# Patient Record
Sex: Female | Born: 1985 | Race: White | Hispanic: No | Marital: Single | Smoking: Current every day smoker
Health system: Southern US, Community
[De-identification: ages and names within clinical notes are randomized; demographics above are authoritative.]

## PROBLEM LIST (undated history)

## (undated) DIAGNOSIS — O139 Gestational [pregnancy-induced] hypertension without significant proteinuria, unspecified trimester: Secondary | ICD-10-CM

## (undated) DIAGNOSIS — D649 Anemia, unspecified: Secondary | ICD-10-CM

## (undated) DIAGNOSIS — F419 Anxiety disorder, unspecified: Secondary | ICD-10-CM

## (undated) DIAGNOSIS — Z5189 Encounter for other specified aftercare: Secondary | ICD-10-CM

## (undated) DIAGNOSIS — I10 Essential (primary) hypertension: Secondary | ICD-10-CM

## (undated) DIAGNOSIS — O24419 Gestational diabetes mellitus in pregnancy, unspecified control: Secondary | ICD-10-CM

## (undated) DIAGNOSIS — N289 Disorder of kidney and ureter, unspecified: Secondary | ICD-10-CM

## (undated) DIAGNOSIS — R87629 Unspecified abnormal cytological findings in specimens from vagina: Secondary | ICD-10-CM

## (undated) DIAGNOSIS — F1490 Cocaine use, unspecified, uncomplicated: Secondary | ICD-10-CM

## (undated) DIAGNOSIS — Z87898 Personal history of other specified conditions: Secondary | ICD-10-CM

## (undated) DIAGNOSIS — I639 Cerebral infarction, unspecified: Secondary | ICD-10-CM

## (undated) DIAGNOSIS — M1990 Unspecified osteoarthritis, unspecified site: Secondary | ICD-10-CM

## (undated) DIAGNOSIS — F32A Depression, unspecified: Secondary | ICD-10-CM

## (undated) DIAGNOSIS — R0600 Dyspnea, unspecified: Secondary | ICD-10-CM

## (undated) DIAGNOSIS — K7460 Unspecified cirrhosis of liver: Secondary | ICD-10-CM

## (undated) DIAGNOSIS — F1910 Other psychoactive substance abuse, uncomplicated: Secondary | ICD-10-CM

## (undated) HISTORY — DX: Dyspnea, unspecified: R06.00

## (undated) HISTORY — DX: Anemia, unspecified: D64.9

## (undated) HISTORY — DX: Unspecified abnormal cytological findings in specimens from vagina: R87.629

## (undated) HISTORY — DX: Cerebral infarction, unspecified: I63.9

## (undated) HISTORY — DX: Gestational (pregnancy-induced) hypertension without significant proteinuria, unspecified trimester: O13.9

## (undated) HISTORY — DX: Encounter for other specified aftercare: Z51.89

## (undated) HISTORY — DX: Unspecified osteoarthritis, unspecified site: M19.90

## (undated) HISTORY — DX: Depression, unspecified: F32.A

## (undated) HISTORY — PX: ARTERY REPAIR: SHX559

## (undated) HISTORY — PX: TEAR DUCT PROBING: SHX793

## (undated) HISTORY — PX: TONSILLECTOMY: SUR1361

## (undated) HISTORY — DX: Gestational diabetes mellitus in pregnancy, unspecified control: O24.419

## (undated) HISTORY — PX: TUBAL LIGATION: SHX77

## (undated) HISTORY — PX: MOUTH SURGERY: SHX715

---
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
## 2003-02-08 ENCOUNTER — Ambulatory Visit (HOSPITAL_COMMUNITY): Admission: RE | Admit: 2003-02-08 | Discharge: 2003-02-08 | Payer: Self-pay | Admitting: Obstetrics & Gynecology

## 2003-03-19 ENCOUNTER — Observation Stay (HOSPITAL_COMMUNITY): Admission: AD | Admit: 2003-03-19 | Discharge: 2003-03-20 | Payer: Self-pay | Admitting: Obstetrics & Gynecology

## 2003-04-10 ENCOUNTER — Observation Stay (HOSPITAL_COMMUNITY): Admission: AD | Admit: 2003-04-10 | Discharge: 2003-04-11 | Payer: Self-pay | Admitting: Obstetrics & Gynecology

## 2003-04-15 ENCOUNTER — Encounter (INDEPENDENT_AMBULATORY_CARE_PROVIDER_SITE_OTHER): Payer: Self-pay | Admitting: Specialist

## 2003-04-15 ENCOUNTER — Inpatient Hospital Stay (HOSPITAL_COMMUNITY): Admission: AD | Admit: 2003-04-15 | Discharge: 2003-04-18 | Payer: Self-pay | Admitting: Obstetrics & Gynecology

## 2003-06-08 ENCOUNTER — Inpatient Hospital Stay (HOSPITAL_COMMUNITY): Admission: AD | Admit: 2003-06-08 | Discharge: 2003-06-08 | Payer: Self-pay | Admitting: Obstetrics and Gynecology

## 2004-02-05 ENCOUNTER — Inpatient Hospital Stay (HOSPITAL_COMMUNITY): Admission: AD | Admit: 2004-02-05 | Discharge: 2004-02-06 | Payer: Self-pay | Admitting: Obstetrics & Gynecology

## 2004-05-20 ENCOUNTER — Inpatient Hospital Stay (HOSPITAL_COMMUNITY): Admission: AD | Admit: 2004-05-20 | Discharge: 2004-05-23 | Payer: Self-pay | Admitting: *Deleted

## 2004-05-20 ENCOUNTER — Encounter (INDEPENDENT_AMBULATORY_CARE_PROVIDER_SITE_OTHER): Payer: Self-pay | Admitting: Specialist

## 2004-05-20 ENCOUNTER — Ambulatory Visit: Payer: Self-pay | Admitting: *Deleted

## 2004-05-25 ENCOUNTER — Inpatient Hospital Stay (HOSPITAL_COMMUNITY): Admission: AD | Admit: 2004-05-25 | Discharge: 2004-05-25 | Payer: Self-pay | Admitting: Family Medicine

## 2005-02-20 ENCOUNTER — Inpatient Hospital Stay (HOSPITAL_COMMUNITY): Admission: AD | Admit: 2005-02-20 | Discharge: 2005-02-20 | Payer: Self-pay | Admitting: Family Medicine

## 2006-11-10 ENCOUNTER — Encounter (INDEPENDENT_AMBULATORY_CARE_PROVIDER_SITE_OTHER): Payer: Self-pay | Admitting: Specialist

## 2006-11-10 ENCOUNTER — Ambulatory Visit: Payer: Self-pay | Admitting: Family Medicine

## 2006-11-10 ENCOUNTER — Inpatient Hospital Stay (HOSPITAL_COMMUNITY): Admission: AD | Admit: 2006-11-10 | Discharge: 2006-11-13 | Payer: Self-pay | Admitting: Family Medicine

## 2006-11-16 ENCOUNTER — Inpatient Hospital Stay (HOSPITAL_COMMUNITY): Admission: AD | Admit: 2006-11-16 | Discharge: 2006-11-16 | Payer: Self-pay | Admitting: Obstetrics and Gynecology

## 2006-11-24 ENCOUNTER — Ambulatory Visit: Payer: Self-pay | Admitting: Obstetrics & Gynecology

## 2008-01-02 ENCOUNTER — Emergency Department (HOSPITAL_COMMUNITY): Admission: EM | Admit: 2008-01-02 | Discharge: 2008-01-02 | Payer: Self-pay | Admitting: Emergency Medicine

## 2008-09-18 ENCOUNTER — Emergency Department (HOSPITAL_COMMUNITY): Admission: EM | Admit: 2008-09-18 | Discharge: 2008-09-18 | Payer: Self-pay | Admitting: Emergency Medicine

## 2009-03-11 ENCOUNTER — Inpatient Hospital Stay (HOSPITAL_COMMUNITY): Admission: AD | Admit: 2009-03-11 | Discharge: 2009-03-11 | Payer: Self-pay | Admitting: Obstetrics & Gynecology

## 2010-02-25 ENCOUNTER — Emergency Department (HOSPITAL_COMMUNITY): Admission: EM | Admit: 2010-02-25 | Discharge: 2010-02-25 | Payer: Self-pay | Admitting: Emergency Medicine

## 2010-11-09 LAB — URINALYSIS, ROUTINE W REFLEX MICROSCOPIC
Bilirubin Urine: NEGATIVE
Nitrite: NEGATIVE
Protein, ur: NEGATIVE mg/dL
Specific Gravity, Urine: 1.015 (ref 1.005–1.030)
Urobilinogen, UA: 0.2 mg/dL (ref 0.0–1.0)
pH: 7 (ref 5.0–8.0)

## 2010-11-09 LAB — CBC
Hemoglobin: 11.2 g/dL — ABNORMAL LOW (ref 12.0–15.0)
MCH: 26.8 pg (ref 26.0–34.0)
MCV: 81.1 fL (ref 78.0–100.0)
RBC: 4.18 MIL/uL (ref 3.87–5.11)
RDW: 17.5 % — ABNORMAL HIGH (ref 11.5–15.5)

## 2010-11-09 LAB — DIFFERENTIAL
Basophils Relative: 1 % (ref 0–1)
Eosinophils Relative: 4 % (ref 0–5)
Lymphocytes Relative: 19 % (ref 12–46)
Lymphs Abs: 1.3 10*3/uL (ref 0.7–4.0)
Neutro Abs: 4.7 10*3/uL (ref 1.7–7.7)
Neutrophils Relative %: 68 % (ref 43–77)

## 2010-11-09 LAB — URINE MICROSCOPIC-ADD ON

## 2010-11-09 LAB — WET PREP, GENITAL

## 2010-11-09 LAB — RPR: RPR Ser Ql: NONREACTIVE

## 2010-11-30 LAB — CBC
HCT: 36.7 % (ref 36.0–46.0)
Hemoglobin: 12.5 g/dL (ref 12.0–15.0)
MCHC: 34 g/dL (ref 30.0–36.0)
MCV: 90.1 fL (ref 78.0–100.0)
Platelets: 328 10*3/uL (ref 150–400)
WBC: 7.4 10*3/uL (ref 4.0–10.5)

## 2010-11-30 LAB — URINALYSIS, ROUTINE W REFLEX MICROSCOPIC
Glucose, UA: NEGATIVE mg/dL
Nitrite: POSITIVE — AB
Protein, ur: >300 mg/dL — AB
Specific Gravity, Urine: 1.025 (ref 1.005–1.030)
Urobilinogen, UA: 2 mg/dL — ABNORMAL HIGH (ref 0.0–1.0)
pH: 6.5 (ref 5.0–8.0)

## 2010-11-30 LAB — URINE MICROSCOPIC-ADD ON

## 2010-11-30 LAB — GC/CHLAMYDIA PROBE AMP, GENITAL: Chlamydia, DNA Probe: NEGATIVE

## 2010-11-30 LAB — URINE CULTURE

## 2011-01-09 NOTE — Discharge Summary (Signed)
   [name], [name]                      ACCOUNT NO.:  [id number]   MEDICAL RECORD NO.:  [id number]                   PATIENT TYPE:  OBV   LOCATION:  9169                                 [facility]   PHYSICIAN:  Gerrit Friends. Aldona Bar, M.D.                DATE OF BIRTH:  [birth year]   DATE OF ADMISSION:  04/10/2003  DATE OF DISCHARGE:  04/11/2003                                 DISCHARGE SUMMARY   DISCHARGE DIAGNOSES:  1. A 38 week intrauterine pregnancy, undelivered.  2. Larey Seat on abdomen.   PROCEDURE:  Observation.   HISTORY OF PRESENT ILLNESS:  This 25 year old gravida 2, aborta 1 female at  approximately [redacted] weeks gestation presented to triage on the evening of  April 10, 2003, relating a fall. Apparently she was running from her  brother and she fell on her stomach. She presented to triage and evaluation  in triage included a cervical examination which revealed her to be 1 cm  thick with a presenting part, vertex, high, and her abdomen was soft. She  was not contracting. Her abdomen was relatively nontender. She had some  scattered abrasions on her hands and knees.   HOSPITAL COURSE:  She was admitted and placed on the fetal monitor for  continuous observation. A review of the tracing finds a very reactive  tracing, which is very reassuring. She also had a CBC done, which revealed a  hemoglobin of 12.2, a white count of 11,500 and a platelet count of 337,000.  A Kleihauer-Betke test was done, which was completely negative - no fetal  cells were seen. On the morning of April 11, 2003, she had rested well  during the evening. Her tracing remained very reactive and reassuring. She  was not contracting and she was discharged to home with instructions to  follow up in the office or as needed at the hospital.   CONDITION ON DISCHARGE:  Improved.   FOLLOW UP:  Followup in the office will be carried out in 5 days or as  needed.   DISCHARGE MEDICATIONS:  She will resume her  prenatal vitamins and return as  needed.                                               Gerrit Friends. Aldona Bar, M.D.    RMW/MEDQ  D:  04/11/2003  T:  04/11/2003  Job:  [id number]

## 2011-01-09 NOTE — Op Note (Signed)
[name], [name]                      ACCOUNT NO.:  [id number]   MEDICAL RECORD NO.:  [id number]                   PATIENT TYPE:  INP   LOCATION:  9165                                 [facility]   PHYSICIAN:  Gerrit Friends. Aldona Bar, M.D.                DATE OF BIRTH:  September 25, 1985   DATE OF PROCEDURE:  04/15/2003  DATE OF DISCHARGE:                                 OPERATIVE REPORT   PREOPERATIVE DIAGNOSES:  1. Term pregnancy.  2. Active labor.  3. Nonreassuring fetal heart tracings.   POSTOPERATIVE DIAGNOSES:  1. Term pregnancy.  2. Active labor.  3. Nonreassuring fetal heart tracings.  4. Delivery of 6 pound 13 ounce female infant; Apgars 8 and 9.   PROCEDURE:  Primary low transverse cesarean section.   SURGEON:  Gerrit Friends. Aldona Bar, M.D.   ANESTHESIA:  Epidural.   ANESTHESIOLOGIST:  Burnett Corrente, M.D.   HISTORY:  This 25 year old female presented on the morning of 04/15/2003 in  early active labor.  At the time of presentation she was totally out of  control, crying and uttering profanities.  Her pregnancy, however, had been  relatively benign.  When she was examined initially her cervix was 1 cm  dilated, posterior vertex and minus 2 station.  She was admitted, and after  several doses of intravenous Stadol quieted down, and ultimately had an  epidural placed (even though she was not 2 cm dilated).  Naturally, after  that, she became more comfortable.  At 2 p.m. she was 2 cm dilated, 50%  effaced with a vertex at minus 2 station; artificial rupture of membranes  was carried out with production of a minimal amount of amniotic fluid (which  appeared to be meconium stained).  IUPC was placed, and amnio infusion was  begun.  Pitocin augmentation likewise ultimately begun.  She did have an  episode of fetal bradycardia, after rupture of membranes, that have  responded to position change.  The patient was put on oxygen.  Thereafter  the tracing became fairly reactive and  Pitocin was ultimately started.  At  about 5:30 p.m. she had progressed to 4 cm of dilatation, 90% effaced, with  a vertex of minus 1 station.  But, after being examined, had another  deceleration into the 90s, which lasted about five minutes.  Although she  was contracting well, it was felt that the tracing at this time was  nonreassuring enough and delivery was far enough away that the best option  was to proceed to delivery by cesarean section at this time.  Indeed, in the  operating room while she was being prepped for her cesarean section, she had  another deceleration of the fetal heart into the 80s, which lasted 3-5 mins.   She was taken to the operating room for a nonreassuring tracing, with  delivery by primary low transverse cesarean section.   DESCRIPTION OF PROCEDURE:  The patient  was taken to the operating room as  mentioned.  Her epidural was augmented.  After she was properly positioned,  prepped and draped; with good anesthetic levels thereafter noted, the  procedure was begun.  A Pfannenstiel incision was made with minimal  difficulty; dissected down sharply to and through the fascia in a low  transverse fashion.  Hemostasis was created in each layer.  Subfascial space  was created inferiorly and superiorly.  Muscles separated in the midline.  Peritoneum identified and entered appropriately, with care taken to avoid  bowels superiorly and the bladder inferiorly.   At this time the vesicouterine peritoneum was incised in a low transverse  fashion, and pushed off the lower uterine segment with ease.  Sharp incision  to the uterus made with the Metzenbaum scissors then carried out in the low  transverse fashion, and extended with the fingers.  Meconium-stained fluid  was encountered.  Thereafter delivery of the head was carried out, and  before the baby was allowed to breathe, the baby was DeLee suctioned  (although no particulate meconium was produced from the baby's  stomach).  At  this time the remainder of the baby was delivered.  After the cord was  clamped and cut the infant was passed off to the awaiting team.  The baby  cried vigorously, and Apgars were signed at 8 and 9.  The infant was  subsequently taken to the nursery in good condition, found to weigh 6 pounds  13 ounces, and in the nursery was doing well.   After the cord bloods were collected, the placenta was delivered intact. The  membranes were stained green.  The placenta was sent to pathology and  appropriately labeled.  At this time the uterus was exteriorized, inspected  and free of any remaining products of conception.  Then closure of the  uterine incision was carried out using a single layer of #1 Vicryl in a  running, locking fashion.  This was oversewn with several figure-of-eight #1  Vicryl for additional hemostasis.   At this time the incision looked dry.  The uterus was well contracted.  Tubes and ovaries appeared normal. After the abdomen was lavaged of all free  blood and clot, the uterus was replaced back into the abdominal cavity.  After all counts were noted to correct, and no foreign bodies were noted to  be remaining in the abdominal cavity, closure of the abdomen was begun in  layers.  The abdominal peritoneum was closed with 0 Vicryl in a running  fashion.  The muscles were secured with the same.  With noting subfascial  hemostasis, the fascia was reapproximated using 0 Vicryl from angle to  midline bilaterally. Subcutaneous tissue was then rendered hemostatic, and  staples were then used to close the skin.  A sterile fresh dressing was  applied.   At this time the patient was transported to the recovery room in  satisfactory condition, having tolerated the procedure well.   ESTIMATED BLOOD LOSS:  500 cc.   DISPOSITION:  All counts were correct x2.  At conclusion of the procedure  both the mother and baby were doing well in their respective recovery  areas.  FINAL DISPOSITION:  This patient presented at term in early labor.  She was  noted to have a nonreassuring fetal heart tracing as labor progressed.  Did  have meconium-stained fluid.  Was taken to the operating room ultimately for  a primary low transverse cesarean section because of the nonreassuring  tracing.  Delivery of a 6 pound 13 ounce female infant, with Apgars of 8 and  9.                                               Gerrit Friends. Aldona Bar, M.D.    RMW/MEDQ  D:  04/15/2003  T:  04/15/2003  Job:  [id number]

## 2011-01-09 NOTE — Op Note (Signed)
NAMEGRACEY, [name]            ACCOUNT NO.:  [id number]   MEDICAL RECORD NO.:  [id number]          PATIENT TYPE:  INP   LOCATION:  9199                          [facility]   PHYSICIAN:  Tanya S. Shawnie Pons, M.D.   DATE OF BIRTH:  03-May-1986   DATE OF PROCEDURE:  11/11/2006  DATE OF DISCHARGE:                               OPERATIVE REPORT   PREOPERATIVE DIAGNOSIS:  Labor, term, previous cesarean sections x3,  limited prenatal care.   POSTOPERATIVE DIAGNOSIS:  Labor, term, previous cesarean sections x3,  limited prenatal care.   PROCEDURE:  Repeat low transverse cesarean section.   SURGEON:  Shelbie Proctor. Shawnie Pons, M.D.   ASSISTANT:  None.   ANESTHESIA:  Spinal and local.   SPECIMENS:  Placenta to path.   ESTIMATED BLOOD LOSS:  1000 cc.   COMPLICATIONS:  None.   FINDINGS:  Meconium-stained fluid, a viable female infant.  Apgars 8 and  9.  Weight 7 pounds, 0 ounces.  Cord pH was 7.22.   REASON FOR PROCEDURE:  Briefly, patient is a 25 year old gravida 4, para  3, with three prior C-sections, who came in with a story suggesting she  had just moved here from [place] and had several prenatal care visits  there but had not had any in a while.  She appeared to be in labor, and  on exam, her cervix was found to be 3-4 cm, 80%, -2.  The fetal heart  rates was in the 90s on admission with some repetitive decels, so she  was taken quickly back to the operating room.   PROCEDURE:  Patient was taken to the operating room, where spinal  analgesia was administered.  She was then prepped and draped in the  usual sterile fashion.  A Foley catheter was placed inside her bladder.  A vertical skin incision was made from the umbilicus to the pubic  symphysis.  This was carried down to the underlying fascia.  The  peritoneal cavity was subsequently entered superiorly, and the incision  was extended just by pulling on the incision.  The bladder blade was  then inserted into the incision.  A low  transverse incision was made on  the uterus.  The amniotic cavity was entered.  Meconium-stained fluid  was noted.  A nuchal cord x1 was also noted.  It was brought up through  the incision, the head delivered.  Nuchal cord was reduced.  The rest of  the body delivered.  There was spontaneous cry heard, and infant was  bulb suctioned on the abdomen.  The cord was clamped x2.  The infant  passed to awaiting peds.  Cord pH and cord blood were obtained.  The  placenta was manually extracted from the uterus, the uterus cleaned with  a dry lap pad.  The edges of the uterine incision were then grasped with  ring forceps.  The uterus was then closed with a 0 Vicryl running  suture.  There was some bleeding from the right edge of the incision,  and a figure-of-eight was used to obtain hemostasis here.  Attention was  then turned to the  fascia, which was closed.  A looped 0 PDS in a bulk  closure fashion from the superior edge to the midline and then fascia  only  from the inferior edge of this incision to the midline.  These two  sutures were tied in the middle.  Any bleeders in the skin were then  cauterized with the electrocautery, and the skin closed using clips.  All instrument and lap counts were correct x2.  Patient was awakened and  taken to the recovery room in stable condition.           ______________________________  Shelbie Proctor Shawnie Pons, M.D.     TSP/MEDQ  D:  11/11/2006  T:  11/11/2006  Job:  [id number]

## 2011-01-09 NOTE — Discharge Summary (Signed)
[name], [name]            ACCOUNT NO.:  [id number]   MEDICAL RECORD NO.:  [id number]          PATIENT TYPE:  INP   LOCATION:  9108                          [facility]   PHYSICIAN:  Conni Elliot, M.D.DATE OF BIRTH:  Dec 20, 1985   DATE OF ADMISSION:  05/20/2004  DATE OF DISCHARGE:                                 DISCHARGE SUMMARY   ADMISSION DIAGNOSES:  25.  A 25 year old gravida 3 para 1-0-1-1 at 50 and 2 weeks with a breech      presentation.  2.  No prenatal care.   DISCHARGE DIAGNOSES:  56  A 25 year old gravida 3 para 2-0-1-2 postoperative  day #2 status post repeat low transverse cesarean section for breech  presentation.  1.  Viable female with Apgars 9 at one minute and 9 at five minutes.   DISCHARGE MEDICATIONS:  1.  Ibuprofen 600 mg one p.o. q.6h. p.r.n.  2.  Percocet 5/325 one to two p.o. q.4-6h. p.r.n. #20 no refills.  3.  Prenatal vitamin one p.o. daily x6 weeks.  4.  Ortho Tri-Cyclen one p.o. daily as directed.   ADMISSION HISTORY:  Ms. Selley presented at term with a breech  presentation.  She was taken for repeat lower transverse C-section.  Her  full-term infant in the past was sectioned for a nonreassuring fetal heart  tracing.   Her postoperative course was uncomplicated.  Her postoperative hemoglobin  was 8.4 - down from 13.7, and she was considered dehydrated on admission.   CONDITION ON DISCHARGE:  The patient was discharged to home in stable  condition.   INSTRUCTIONS GIVEN TO PATIENT:  The patient was told of her above medical  regimen.  [facility].      LC/MEDQ  D:  05/23/2004  T:  05/23/2004  Job:  [id number]   cc:   [facility] [employer]

## 2011-01-09 NOTE — Discharge Summary (Signed)
   [name], [name]                      ACCOUNT NO.:  [id number]   MEDICAL RECORD NO.:  [id number]                   PATIENT TYPE:  INP   LOCATION:  9142                                 [facility]   PHYSICIAN:  Miguel Aschoff, M.D.                    DATE OF BIRTH:  [birth year]   DATE OF ADMISSION:  04/15/2003  DATE OF DISCHARGE:  04/18/2003                                 DISCHARGE SUMMARY   ADMISSION DIAGNOSIS:  Intrauterine pregnancy at term.   OPERATIONS AND PROCEDURES:  1. Primary low flap transverse cesarean section for nonreassuring fetal     heart tracing.  2. Epidural anesthesia.   BRIEF HISTORY:  The patient is a 25 year old white female gravida 2 para 1-0-  1-1 with an estimated date of confinement of April 19, 2003.  The patient  was admitted in early labor and was followed through her course of labor.  She did progress to 4 cm and 90% and developed the onset of decelerations.  Decelerations had reached a low heart rate of 80, and because of the  nonreassuring pattern of the fetal heart rate it was elected to proceed with  primary cesarean section.  Cesarean section was carried out without  difficulty with the delivery of a viable female infant, Apgar 8 and 9.  The  patient's postoperative course was essentially uncomplicated.  She tolerated  increase in ambulation and diet well, her incision healed well, and by  postoperative day #3 was in satisfactory condition and stable enough to be  discharged home.  Her hemoglobin on discharge was 10.6.  Medications for  home were Tylox one q.3h. as needed for pain.  She was to continue her  prenatal vitamins.  She was to start on regular diet. Instructed to do no  heavy lifting for six weeks, place nothing in the vagina, and call for if  there were any problems such as fever, pain, or heavy bleeding.  She was  sent home with our routine booklet of instructions.                                               Miguel Aschoff,  M.D.    AR/MEDQ  D:  05/03/2003  T:  05/03/2003  Job:  [id number]

## 2011-01-09 NOTE — Op Note (Signed)
NAMEKAZI, MONTORO            ACCOUNT NO.:  [id number]   MEDICAL RECORD NO.:  [id number]          PATIENT TYPE:  INP   LOCATION:  9156                          [facility]   PHYSICIAN:  Conni Elliot, M.D.DATE OF BIRTH:  1986-08-09   DATE OF PROCEDURE:  05/21/2004  DATE OF DISCHARGE:                                 OPERATIVE REPORT   PREOPERATIVE DIAGNOSES:  1.  Intrauterine pregnancy at approximately 37 weeks' gestation in active      labor, and breech presentation.  2 . History of prior cesarean delivery.   OPERATION:  Low transverse cesarean section.   OPERATOR:  Conni Elliot, M.D., and Devra Dopp, M.D.   ANESTHESIA:  Spinal.   OPERATIVE FINDINGS:  A 6 pound 4 ounce female with Apgars of 9 and 9.  Cord  pH was sent as well as the placenta.   After placing the patient under a spinal anesthetic, the patient was supine  in the left lateral tilt position receiving oxygen.  The abdomen was prepped  and draped in a sterile fashion.  A low transverse Pfannenstiel incision was  made, the incision made from the skin to the fascia.  It should be noted  there were significant adhesions in every plane, particularly in the  subcutaneous fascial area, which increased the amount of time between  initial incision and delivery of the baby.  Once having gotten to the  uterus, a bladder flap was created.  A low transverse Pfannenstiel incision  was made.  The baby was delivered from a breech presentation without  difficulty.  There was no entrapment of the fetal head.  The cord doubly  clamped and cut and the baby handed to neonatologist in attendance.  The  placenta was delivered spontaneously.  The uterus, bladder flap, anterior  peritoneum, fascia, subcutaneous tissue, and skin were closed in standard  fashion.  Estimated blood loss approximately 1000 mL.  Instrument count  correct.      ASG/MEDQ  D:  05/21/2004  T:  05/21/2004  Job:  [id number]

## 2011-01-09 NOTE — Discharge Summary (Signed)
[name], [name]            ACCOUNT NO.:  [id number]   MEDICAL RECORD NO.:  [id number]          PATIENT TYPE:  INP   LOCATION:  9143                          [facility]   PHYSICIAN:  Tanya S. Shawnie Pons, M.D.   DATE OF BIRTH:  Aug 18, 1986   DATE OF ADMISSION:  11/10/2006  DATE OF DISCHARGE:  11/13/2006                               DISCHARGE SUMMARY   ADMISSION DIAGNOSES:  1. Gravida 5, para 3-0-1-3, in labor at term.  2. History of previous cesarean section x3.   DISCHARGE DIAGNOSES:  1. Status post repeat vertical incision cesarean section.  2. Meconium-stained fluid.  3. Viable female.   HISTORY:  The patient is a 25 year old G5, P3-0-1-3 who presented to the  Maternity Admissions in early labor at term.  She had no prenatal care  and was a substance abuser as well.  PMH significant for current  substance abuse of cocaine; she is not in treatment.  She states she  recently moved from [place] and had had a lapse in prenatal care;  however, there were no records to be found.   SURGICAL HISTORY:  Low vertical incision C-section x3.   ALLERGIES:  Included PENICILLIN, CODEINE and AMPICILLIN.   MEDICATIONS:  Include prenatal vitamins.   SOCIAL HISTORY:  Significant that she had 2 other children in foster  care in [facility], involved with CNS.  She admitted to cocaine and  marijuana, denied tobacco use.   PRENATAL COURSE:  Otherwise uneventful per the patient's history.  She  denied vaginal bleeding, leakage of fluid and had good fetal movement.  Admission WBC 15, hemoglobin was 13.2, platelets 332,000.  HbsAg was  negative.  Rapid HIV negative.  UDS positive for cocaine.  Blood type O  negative, rubella immune.  RPR nonreactive.   ADMISSION PHYSICAL EXAMINATION:  VITAL SIGNS:  Unremarkable with BP of  125/76.  Heart:  RRR.  LUNGS:  CTA.  ABDOMEN:  Soft, nontender, term  size.  Vertical skin incision.  PELVIC:  Revealed cervix to be 3- to 4-  cm dilated, 50% and the  determination was made that she was in early  labor.   HOSPITAL COURSE:  They proceeded to repeat vertical incision C-section.  Findings were meconium fluid, viable female with Apgars of 8 and 9, weight  7 pounds, cord pH 7.22.  Infant was sent to newborn nursery in stable  condition.  Postoperatively, she did well.  Of note, she also had a BTL.  On postoperative day #2, she was anxious for early discharge, had help  at home, had already visited the social worker and was to see the CPS  counselor before discharge.  She had been tolerating a regular diet,  ambulatory without orthostatic symptoms, voiding QS.  Vital signs were  stable.  Hemoglobin was 10.2.  Breasts were soft.  Abdomen soft,  appropriately tender, fundus below umbilicus,  staples, no inflammation.  She was discharged home with encouragement to get into substance abuse  treatment with discussion of her living situation.   MEDICATIONS:  1. Ibuprofen 600 p.o. q.6 h, #30.  2. Prenatal vitamin one p.o.  daily.  3. Percocet 5/325 one or two every 4 hours p.r.n. pain, #21 refill.   FOLLOWUP:  To be at [facility] in 6 weeks.  She was also given an  appointment to come in at 7 days for her staple removal .      Deirdre Poe, C.N.M.    ______________________________  Shelbie Proctor. Shawnie Pons, M.D.    DP/MEDQ  D:  12/22/2006  T:  12/22/2006  Job:  [id number]

## 2011-05-27 ENCOUNTER — Emergency Department (HOSPITAL_COMMUNITY): Payer: Self-pay

## 2011-05-27 ENCOUNTER — Emergency Department (HOSPITAL_COMMUNITY)
Admission: EM | Admit: 2011-05-27 | Discharge: 2011-05-28 | Disposition: A | Discharge status: Home / Self Care | Payer: Self-pay | Attending: Emergency Medicine | Admitting: Emergency Medicine

## 2011-05-27 DIAGNOSIS — S60559A Superficial foreign body of unspecified hand, initial encounter: Secondary | ICD-10-CM | POA: Insufficient documentation

## 2011-05-27 DIAGNOSIS — T07XXXA Unspecified multiple injuries, initial encounter: Secondary | ICD-10-CM | POA: Insufficient documentation

## 2011-05-27 DIAGNOSIS — W268XXA Contact with other sharp object(s), not elsewhere classified, initial encounter: Secondary | ICD-10-CM | POA: Insufficient documentation

## 2011-05-27 DIAGNOSIS — M79609 Pain in unspecified limb: Secondary | ICD-10-CM | POA: Insufficient documentation

## 2011-05-28 ENCOUNTER — Ambulatory Visit (HOSPITAL_COMMUNITY): Payer: Self-pay

## 2011-05-28 ENCOUNTER — Ambulatory Visit (HOSPITAL_COMMUNITY)
Admission: AD | Admit: 2011-05-28 | Discharge: 2011-05-28 | Disposition: A | Discharge status: Home / Self Care | Payer: Self-pay | Source: Ambulatory Visit | Attending: Orthopedic Surgery | Admitting: Orthopedic Surgery

## 2011-05-28 DIAGNOSIS — S61509A Unspecified open wound of unspecified wrist, initial encounter: Secondary | ICD-10-CM | POA: Insufficient documentation

## 2011-05-28 DIAGNOSIS — Z01812 Encounter for preprocedural laboratory examination: Secondary | ICD-10-CM | POA: Insufficient documentation

## 2011-05-28 DIAGNOSIS — S61409A Unspecified open wound of unspecified hand, initial encounter: Secondary | ICD-10-CM | POA: Insufficient documentation

## 2011-05-28 DIAGNOSIS — M6289 Other specified disorders of muscle: Secondary | ICD-10-CM | POA: Insufficient documentation

## 2011-05-28 DIAGNOSIS — X789XXA Intentional self-harm by unspecified sharp object, initial encounter: Secondary | ICD-10-CM | POA: Insufficient documentation

## 2011-05-28 DIAGNOSIS — Z01810 Encounter for preprocedural cardiovascular examination: Secondary | ICD-10-CM | POA: Insufficient documentation

## 2011-05-28 LAB — SURGICAL PCR SCREEN: MRSA, PCR: NEGATIVE

## 2011-05-28 LAB — BASIC METABOLIC PANEL
CO2: 28 mEq/L (ref 19–32)
GFR calc Af Amer: >90 mL/min (ref >90)
GFR calc non Af Amer: >90 mL/min (ref >90)
Potassium: 3.9 mEq/L (ref 3.5–5.1)
Sodium: 137 mEq/L (ref 135–145)

## 2011-05-28 LAB — CBC
HCT: 39.6 % (ref 36.0–46.0)
Hemoglobin: 13.2 g/dL (ref 12.0–15.0)
MCH: 29 pg (ref 26.0–34.0)
MCHC: 33.3 g/dL (ref 30.0–36.0)
MCV: 87 fL (ref 78.0–100.0)
Platelets: 208 K/uL (ref 150–400)
RBC: 4.55 MIL/uL (ref 3.87–5.11)
RDW: 16.3 % — ABNORMAL HIGH (ref 11.5–15.5)
WBC: 4.9 K/uL (ref 4.0–10.5)

## 2011-05-28 LAB — HCG, SERUM, QUALITATIVE: Preg, Serum: NEGATIVE

## 2011-06-10 NOTE — Op Note (Signed)
[name], [name] NO.:  [id number]  MEDICAL RECORD NO.:  [id number]  LOCATION:  [facility]                         FACILITY:  [facility]  PHYSICIAN:  Madelynn Done, MD  DATE OF BIRTH:  June 08, 1986  DATE OF PROCEDURE:  05/28/2011 DATE OF DISCHARGE:  05/28/2011                              OPERATIVE REPORT   PREOPERATIVE DIAGNOSES: 1. Right hand laceration, dorsal laceration, 3 cm. 2. Right wrist volar laceration, 4 cm. 3. Retained foreign bodies of right hand.  ANESTHESIA:  General via LMA.  SURGICAL PROCEDURES: 1. Debridement of skin and subcutaneous tissue, right hand dorsal and     volar wounds, excisional debridement. 2. Removal of deep foreign bodies, retained fragments of glass, right     hand.  SURGICAL INDICATIONS:  Ms. Grade is a right-hand-dominant female who punched the hand through a glass window, sustaining the lacerations in the dorsal and volar aspect of the hand.  The patient came into theoffice today with retained foreign bodies and was concern about the worsening pain concerning for deep soft tissue infection.  Risks, benefits, and alternatives were discussed in detail with the patient and signed informed consent was obtained.  Risks include but not limited to bleeding, infection, damage to nearby nerves, arteries or tendons, loss of motion in the elbow, wrist and digits, retained foreign body, and need for further surgical intervention.  DESCRIPTION OF PROCEDURE:  The patient was brought and identified in the preoperative holding area, and a mark was made on the on the right hand indicate correct operative site.  The patient then brought back to the operating room, placed supine on anesthesia room table.  General anesthesia was administered.  The patient tolerated this well.  Well- padded tourniquet was then placed on the right brachium and sealed with a 1000 drape.  Right upper extremity was prepped and draped in normal sterile  fashion.  Time-out was called, correct site was identified, procedure was then begun.  Attention was then turned to the dorsal aspect of her hand where the 2-cm laceration was then lengthened proximally and distally.  Excisional debridement of skin and subcutaneous tissue was then carried out.  The patient did have several loose fragments of glass were removed from the subcutaneous tissues and further down to extensor retinaculum where small pieces of the retinaculum.  Following this, wound irrigation was done and the skin was then closed with simple nylon Prolene sutures.  Attention was then turned to volarly where the laceration was extended proximally and distally.  Dissection carried down deep through the subcutaneous tissues where excisional debris was then carried out sharply with a knife and sharp show scissors.  A small fragments of glass were then removed.  The patient tolerated this well.  The wound was then thoroughly irrigated. Copious wound irrigation was done.  The skin was then closed using a simple Prolene sutures.  Adaptic dressing and sterile bandage were then applied.  The patient was placed in a well-padded volar splint, extubated, taken to recovery room in good condition.  POSTOPERATIVE PLAN:  The patient discharged home and will be seen back to the office in approximately 10 days for wound check, suture removal, and gradually use  of activity.     Madelynn Done, MD     FWO/MEDQ  D:  05/28/2011  T:  05/29/2011  Job:  [id number]  Electronically Signed by Bradly Bienenstock IV MD on 06/10/2011 03:58:37 PM

## 2012-09-28 ENCOUNTER — Emergency Department (HOSPITAL_COMMUNITY)
Admission: EM | Admit: 2012-09-28 | Discharge: 2012-09-28 | Disposition: A | Discharge status: Home / Self Care | Payer: Self-pay | Attending: Emergency Medicine | Admitting: Emergency Medicine

## 2012-09-28 ENCOUNTER — Encounter (HOSPITAL_COMMUNITY): Payer: Self-pay

## 2012-09-28 DIAGNOSIS — F17200 Nicotine dependence, unspecified, uncomplicated: Secondary | ICD-10-CM | POA: Insufficient documentation

## 2012-09-28 DIAGNOSIS — R109 Unspecified abdominal pain: Secondary | ICD-10-CM | POA: Insufficient documentation

## 2012-09-28 DIAGNOSIS — K089 Disorder of teeth and supporting structures, unspecified: Secondary | ICD-10-CM | POA: Insufficient documentation

## 2012-09-28 DIAGNOSIS — K0889 Other specified disorders of teeth and supporting structures: Secondary | ICD-10-CM

## 2012-09-28 DIAGNOSIS — Z79899 Other long term (current) drug therapy: Secondary | ICD-10-CM | POA: Insufficient documentation

## 2012-09-28 DIAGNOSIS — R220 Localized swelling, mass and lump, head: Secondary | ICD-10-CM | POA: Insufficient documentation

## 2012-09-28 DIAGNOSIS — Z8659 Personal history of other mental and behavioral disorders: Secondary | ICD-10-CM | POA: Insufficient documentation

## 2012-09-28 DIAGNOSIS — I10 Essential (primary) hypertension: Secondary | ICD-10-CM | POA: Insufficient documentation

## 2012-09-28 HISTORY — DX: Essential (primary) hypertension: I10

## 2012-09-28 HISTORY — DX: Anxiety disorder, unspecified: F41.9

## 2012-09-28 LAB — COMPREHENSIVE METABOLIC PANEL
Albumin: 3.6 g/dL (ref 3.5–5.2)
Alkaline Phosphatase: 120 U/L — ABNORMAL HIGH (ref 39–117)
BUN: 12 mg/dL (ref 6–23)
Calcium: 9.3 mg/dL (ref 8.4–10.5)
Creatinine, Ser: 0.76 mg/dL (ref 0.50–1.10)
GFR calc Af Amer: >90 mL/min (ref >90)
Glucose, Bld: 116 mg/dL — ABNORMAL HIGH (ref 70–99)
Total Protein: 7.3 g/dL (ref 6.0–8.3)

## 2012-09-28 LAB — CBC WITH DIFFERENTIAL/PLATELET
Basophils Relative: 0 % (ref 0–1)
Eosinophils Absolute: 0.2 10*3/uL (ref 0.0–0.7)
Eosinophils Relative: 2 % (ref 0–5)
Hemoglobin: 12.2 g/dL (ref 12.0–15.0)
Lymphs Abs: 1.4 10*3/uL (ref 0.7–4.0)
MCH: 26.6 pg (ref 26.0–34.0)
MCHC: 32.8 g/dL (ref 30.0–36.0)
MCV: 81.2 fL (ref 78.0–100.0)
Monocytes Absolute: 0.6 10*3/uL (ref 0.1–1.0)
Monocytes Relative: 7 % (ref 3–12)
RBC: 4.58 MIL/uL (ref 3.87–5.11)

## 2012-09-28 MED ORDER — HYDROMORPHONE HCL PF 1 MG/ML IJ SOLN
1.0000 mg | Freq: Once | INTRAMUSCULAR | Status: AC
  Administered 2012-09-28: 1 mg via INTRAMUSCULAR
  Filled 2012-09-28: qty 1

## 2012-09-28 MED ORDER — OXYCODONE-ACETAMINOPHEN 5-325 MG PO TABS
ORAL_TABLET | ORAL | Status: AC
  Administered 2012-09-28: 1 via ORAL
  Filled 2012-09-28: qty 1

## 2012-09-28 MED ORDER — HYDROCODONE-ACETAMINOPHEN 5-325 MG PO TABS: 1.0000 | ORAL_TABLET | Freq: Four times a day (QID) | ORAL | Status: DC | PRN

## 2012-09-28 MED ORDER — LABETALOL HCL 5 MG/ML IV SOLN
20.0000 mg | Freq: Once | INTRAVENOUS | Status: AC
  Administered 2012-09-28: 20 mg via INTRAVENOUS
  Filled 2012-09-28: qty 4

## 2012-09-28 MED ORDER — OXYCODONE-ACETAMINOPHEN 5-325 MG PO TABS
1.0000 | ORAL_TABLET | Freq: Once | ORAL | Status: AC
  Administered 2012-09-28: 1 via ORAL

## 2012-09-28 MED ORDER — CLINDAMYCIN HCL 150 MG PO CAPS: ORAL_CAPSULE | ORAL | Status: DC

## 2012-09-28 MED ORDER — LISINOPRIL 20 MG PO TABS: 20.0000 mg | ORAL_TABLET | Freq: Every day | ORAL | Status: DC

## 2012-09-28 NOTE — ED Notes (Signed)
Pt also reports has "knot" in abd to the right of her umbilicus.  Says area is tender to palpate and when pushes the area, it causes pain in her chest.  Denies vomiting or diarrhea.  LBM was today and was normal per pt.

## 2012-09-28 NOTE — ED Notes (Signed)
Patient stated that she felt "flushed".  Room is hot - adjusted thermostat and gave patient some ice chips.

## 2012-09-28 NOTE — ED Notes (Signed)
Pt reports toothache, ?abcess to right lower jaw area. Also has high blood pressure x 2 years.  Yesterday 254/198.  Is supposed to be on meds but  Ran out 1 year ago.

## 2012-09-28 NOTE — ED Notes (Signed)
Pt c/o toothache in r lower jaw for past 3 days.  Small amount of facial swelling noted.  Also c/o headache on r side of head.  Pt hypertensive and said was diagnosed with htn but ran out of medication one year ago.  Reports she doesn't have a pcp so she takes the meds she gets from the ED or wherever else she goes until she runs out but doesn't have anyone to follow up with to manage her bp.  Reports feeling like she gives out of energy with minimal exertion and feels "lightheaded."

## 2012-09-28 NOTE — ED Provider Notes (Signed)
History   This chart was scribed for Benny Lennert, MD by Charolett Bumpers, ED Scribe. The patient was seen in room APA03/APA03. Patient's care was started at 1300.   CSN: [id number]  Arrival date & time 09/28/12  1058   First MD Initiated Contact with Patient 09/28/12 1300      Chief Complaint  Patient presents with  . Dental Pain  . Hypertension  . Abdominal Pain    "knot to mid ab area for last 6 months, and cont. to get bigger"      [name] is a 27 y.o. female who presents to the Emergency Department complaining of gradually worsening, severe right lower dental pain. She states she has associated facial swelling. She doesn't have a dentist. Her BP is 205/124 here in ED and reports a h/o HTN. She reports her BP was 254/198 yesterday. She states that she has taken Clonidine in the past but hasn't been on any regular medications.   Patient is a 27 y.o. female presenting with tooth pain. The history is provided by the patient. No language interpreter was used.  Dental PainThe primary symptoms include mouth pain. Primary symptoms do not include headaches or cough. The symptoms began 3 to 5 days ago. The symptoms are worsening. The symptoms are new. The symptoms occur constantly.  Affected locations include: teeth.  Additional symptoms include: facial swelling. Additional symptoms do not include: fatigue.    Past Medical History  Diagnosis Date  . Hypertension   . Anxiety     Past Surgical History  Procedure Date  . Artery repair   . Cesarean section     x 5  . Tubal ligation     No family history on file.  History  Substance Use Topics  . Smoking status: Current Every Day Smoker    Types: Cigarettes  . Smokeless tobacco: Not on file  . Alcohol Use: Yes     Comment: every day    OB History    Grav Para Term Preterm Abortions TAB SAB Ect Mult Living                  Review of Systems  Constitutional: Negative for fatigue.  HENT: Positive for  facial swelling and dental problem. Negative for congestion, sinus pressure and ear discharge.   Eyes: Negative for discharge.  Respiratory: Negative for cough.   Cardiovascular: Negative for chest pain.  Gastrointestinal: Negative for abdominal pain and diarrhea.  Genitourinary: Negative for frequency and hematuria.  Musculoskeletal: Negative for back pain.  Skin: Negative for rash.  Neurological: Negative for seizures and headaches.  Hematological: Negative.   Psychiatric/Behavioral: Negative for hallucinations.  All other systems reviewed and are negative.    Allergies  Codeine and Penicillins  Home Medications   Current Outpatient Rx  Name  Route  Sig  Dispense  Refill  . ACETAMINOPHEN 500 MG PO TABS   Oral   Take 1,000 mg by mouth every 6 (six) hours as needed. For pain         . DM-GUAIFENESIN ER 30-600 MG PO TB12   Oral   Take 1 tablet by mouth every 12 (twelve) hours.         . IBUPROFEN 200 MG PO TABS   Oral   Take 600 mg by mouth every 6 (six) hours as needed. For pain           Triage Vitals: BP 205/124  Pulse 105  Temp 97.8 F (36.6  C) (Oral)  Resp 20  Ht 5' (1.524 m)  Wt 138 lb (62.596 kg)  BMI 26.95 kg/m2  SpO2 100%  LMP 09/27/2012  Physical Exam  Nursing note and vitals reviewed. Constitutional: She is oriented to person, place, and time. She appears well-developed.  HENT:  Head: Normocephalic and atraumatic.       Tenderness at the right lower molar with swelling to the right cheek.   Eyes: Conjunctivae normal and EOM are normal. No scleral icterus.  Neck: Neck supple. No thyromegaly present.  Cardiovascular: Normal rate and regular rhythm.  Exam reveals no gallop and no friction rub.   No murmur heard. Pulmonary/Chest: No stridor. She has no wheezes. She has no rales. She exhibits no tenderness.  Abdominal: She exhibits no distension. There is no tenderness. There is no rebound.  Musculoskeletal: Normal range of motion. She exhibits no  edema.  Lymphadenopathy:    She has no cervical adenopathy.  Neurological: She is oriented to person, place, and time. Coordination normal.  Skin: No rash noted. No erythema.  Psychiatric: She has a normal mood and affect. Her behavior is normal.    ED Course  Procedures (including critical care time)  DIAGNOSTIC STUDIES: Oxygen Saturation is 99% on room air, normal by my interpretation.    COORDINATION OF CARE:  13:05-Discussed planned course of treatment with the patient including Dilaudid, who is agreeable at this time.   13:15-Medication Orders: Hydromorphone (Dilaudid) injection 1 mg-once.   14:00-Medication Orders: Labetalol (Normodyne, Trandate) injection 20 mg-once.   15:00-Recheck: Will d/c pt home with antibiotic, pain medication and HTN medication.   Results for orders placed during the hospital encounter of 09/28/12  CBC WITH DIFFERENTIAL      Component Value Range   WBC 8.8  4.0 - 10.5 K/uL   RBC 4.58  3.87 - 5.11 MIL/uL   Hemoglobin 12.2  12.0 - 15.0 g/dL   HCT 16.1  09.6 - 04.5 %   MCV 81.2  78.0 - 100.0 fL   MCH 26.6  26.0 - 34.0 pg   MCHC 32.8  30.0 - 36.0 g/dL   RDW 40.9 (*) 81.1 - 91.4 %   Platelets 355  150 - 400 K/uL   Neutrophils Relative 74  43 - 77 %   Neutro Abs 6.5  1.7 - 7.7 K/uL   Lymphocytes Relative 16  12 - 46 %   Lymphs Abs 1.4  0.7 - 4.0 K/uL   Monocytes Relative 7  3 - 12 %   Monocytes Absolute 0.6  0.1 - 1.0 K/uL   Eosinophils Relative 2  0 - 5 %   Eosinophils Absolute 0.2  0.0 - 0.7 K/uL   Basophils Relative 0  0 - 1 %   Basophils Absolute 0.0  0.0 - 0.1 K/uL  COMPREHENSIVE METABOLIC PANEL      Component Value Range   Sodium 136  135 - 145 mEq/L   Potassium 3.4 (*) 3.5 - 5.1 mEq/L   Chloride 102  96 - 112 mEq/L   CO2 25  19 - 32 mEq/L   Glucose, Bld 116 (*) 70 - 99 mg/dL   BUN 12  6 - 23 mg/dL   Creatinine, Ser 7.82  0.50 - 1.10 mg/dL   Calcium 9.3  8.4 - 95.6 mg/dL   Total Protein 7.3  6.0 - 8.3 g/dL   Albumin 3.6  3.5 -  5.2 g/dL   AST 15  0 - 37 U/L   ALT 17  0 - 35 U/L   Alkaline Phosphatase 120 (*) 39 - 117 U/L   Total Bilirubin 0.2 (*) 0.3 - 1.2 mg/dL   GFR calc non Af Amer >90  >90 mL/min   GFR calc Af Amer >90  >90 mL/min    No results found.   No diagnosis found.    MDM    The chart was scribed for me under my direct supervision.  I personally performed the history, physical, and medical decision making and all procedures in the evaluation of this patient.Benny Lennert, MD 09/28/12 1520

## 2013-01-04 ENCOUNTER — Emergency Department (HOSPITAL_COMMUNITY)
Admission: EM | Admit: 2013-01-04 | Discharge: 2013-01-04 | Disposition: A | Discharge status: Home / Self Care | Payer: Self-pay | Attending: Emergency Medicine | Admitting: Emergency Medicine

## 2013-01-04 ENCOUNTER — Encounter (HOSPITAL_COMMUNITY): Payer: Self-pay

## 2013-01-04 ENCOUNTER — Emergency Department (HOSPITAL_COMMUNITY): Payer: Self-pay

## 2013-01-04 DIAGNOSIS — F17200 Nicotine dependence, unspecified, uncomplicated: Secondary | ICD-10-CM | POA: Insufficient documentation

## 2013-01-04 DIAGNOSIS — Z79899 Other long term (current) drug therapy: Secondary | ICD-10-CM | POA: Insufficient documentation

## 2013-01-04 DIAGNOSIS — I10 Essential (primary) hypertension: Secondary | ICD-10-CM | POA: Insufficient documentation

## 2013-01-04 DIAGNOSIS — T07XXXA Unspecified multiple injuries, initial encounter: Secondary | ICD-10-CM

## 2013-01-04 DIAGNOSIS — W1809XA Striking against other object with subsequent fall, initial encounter: Secondary | ICD-10-CM | POA: Insufficient documentation

## 2013-01-04 DIAGNOSIS — IMO0002 Reserved for concepts with insufficient information to code with codable children: Secondary | ICD-10-CM | POA: Insufficient documentation

## 2013-01-04 DIAGNOSIS — Z8659 Personal history of other mental and behavioral disorders: Secondary | ICD-10-CM | POA: Insufficient documentation

## 2013-01-04 DIAGNOSIS — Y9289 Other specified places as the place of occurrence of the external cause: Secondary | ICD-10-CM | POA: Insufficient documentation

## 2013-01-04 DIAGNOSIS — S8000XA Contusion of unspecified knee, initial encounter: Secondary | ICD-10-CM | POA: Insufficient documentation

## 2013-01-04 DIAGNOSIS — Z880 Allergy status to penicillin: Secondary | ICD-10-CM | POA: Insufficient documentation

## 2013-01-04 DIAGNOSIS — S5000XA Contusion of unspecified elbow, initial encounter: Secondary | ICD-10-CM | POA: Insufficient documentation

## 2013-01-04 DIAGNOSIS — Y939 Activity, unspecified: Secondary | ICD-10-CM | POA: Insufficient documentation

## 2013-01-04 MED ORDER — OXYCODONE-ACETAMINOPHEN 5-325 MG PO TABS
2.0000 | ORAL_TABLET | Freq: Once | ORAL | Status: AC
  Administered 2013-01-04: 2 via ORAL
  Filled 2013-01-04: qty 2

## 2013-01-04 MED ORDER — CLONIDINE HCL 0.2 MG PO TABS
0.2000 mg | ORAL_TABLET | Freq: Once | ORAL | Status: AC
  Administered 2013-01-04: 0.2 mg via ORAL
  Filled 2013-01-04: qty 1

## 2013-01-04 MED ORDER — NAPROXEN 375 MG PO TABS: 375.0000 mg | ORAL_TABLET | Freq: Two times a day (BID) | ORAL | Status: DC

## 2013-01-04 MED ORDER — IBUPROFEN 400 MG PO TABS
600.0000 mg | ORAL_TABLET | Freq: Once | ORAL | Status: AC
  Administered 2013-01-04: 600 mg via ORAL
  Filled 2013-01-04: qty 2

## 2013-01-04 MED ORDER — CLONIDINE HCL 0.2 MG PO TABS: 0.1000 mg | ORAL_TABLET | Freq: Two times a day (BID) | ORAL | Status: DC

## 2013-01-04 NOTE — ED Provider Notes (Signed)
History    26yF with pain after fall partially through porch last night. Legs went through and pt struck L elbow. Worsened pain this morning. Did not strike head. No HA, neck or back pain. No numbness, tingling or loss of strength. Abrasions to R posterior leg. Has tried taking BC powder with mild relief. Pt also requesting BP meds, specifically clonidine. Has been off meds for ~1 month.   CSN: [id number]  Arrival date & time 01/04/13  1419   First MD Initiated Contact with Patient 01/04/13 1500      Chief Complaint  Patient presents with  . Fall    (Consider location/radiation/quality/duration/timing/severity/associated sxs/prior treatment) HPI  Past Medical History  Diagnosis Date  . Hypertension   . Anxiety     Past Surgical History  Procedure Laterality Date  . Artery repair    . Cesarean section      x 5  . Tubal ligation      No family history on file.  History  Substance Use Topics  . Smoking status: Current Every Day Smoker    Types: Cigarettes  . Smokeless tobacco: Not on file  . Alcohol Use: Yes     Comment: every day    OB History   Grav Para Term Preterm Abortions TAB SAB Ect Mult Living                  Review of Systems  All systems reviewed and negative, other than as noted in HPI.   Allergies  Acetaminophen; Codeine; and Penicillins  Home Medications   Current Outpatient Rx  Name  Route  Sig  Dispense  Refill  . Aspirin-Acetaminophen-Caffeine (GOODY HEADACHE PO)   Oral   Take 1 each by mouth daily as needed.         Marland Kitchen lisinopril (PRINIVIL,ZESTRIL) 20 MG tablet   Oral   Take 1 tablet (20 mg total) by mouth daily.   30 tablet   1     BP 200/132  Pulse 88  Temp(Src) 98.8 F (37.1 C) (Oral)  Resp 20  Ht 5\' 1"  (1.549 m)  Wt 134 lb (60.782 kg)  BMI 25.33 kg/m2  SpO2 99%  LMP 01/01/2013  Physical Exam  Nursing note and vitals reviewed. Constitutional: She appears well-developed and well-nourished. No distress.  HENT:   Head: Normocephalic and atraumatic.  Eyes: Conjunctivae are normal. Right eye exhibits no discharge. Left eye exhibits no discharge.  Neck: Neck supple.  Cardiovascular: Normal rate, regular rhythm and normal heart sounds.  Exam reveals no gallop and no friction rub.   No murmur heard. Pulmonary/Chest: Effort normal and breath sounds normal. No respiratory distress.  Abdominal: Soft. She exhibits no distension. There is no tenderness.  Musculoskeletal: She exhibits no edema and no tenderness.  Tenderness to palpation L elbow with mild increase in pain with ROM. NVI distally.   Neurological: She is alert.  Skin: Skin is warm.  Superficial abrasions to R calf/posterior thigh. Small areas of ecchymosis to R knee and L elbow.   Psychiatric: She has a normal mood and affect. Her behavior is normal. Thought content normal.    ED Course  Procedures (including critical care time)  Labs Reviewed - No data to display No results found.   1. Multiple contusions   2. Abrasions of multiple sites   3. HTN (hypertension)       MDM  26yF with L elbow pain and abrasions. Imaging neg for fx. Plan symptomatic tx. Clonidine for BP.  Resource list provided.          Raeford Razor, MD 01/04/13 [phone]

## 2013-01-04 NOTE — ED Notes (Signed)
1.Pt reports falling through a porch last night, cont. To have pain to left arm and bil legs, scratches to back of legs. And bruising noted to left arm, able to move all ext. Without difficulty. Denies loc. 2. Pt also needs her bp meds  Refilled.  Has been out of it for 1 months.

## 2013-01-04 NOTE — ED Notes (Signed)
Pt c/o pain to arms bilaterally and right leg after a fall that occurred last night. Pt states she "fell through a porch". Pt has abrasions to right leg and bruising to left upper arm. Pt was ambulatory and able to move extremities but reports pain when doing so.

## 2013-01-15 ENCOUNTER — Emergency Department (HOSPITAL_COMMUNITY)
Admission: EM | Admit: 2013-01-15 | Discharge: 2013-01-15 | Disposition: A | Discharge status: Home / Self Care | Payer: Self-pay | Attending: Emergency Medicine | Admitting: Emergency Medicine

## 2013-01-15 ENCOUNTER — Encounter (HOSPITAL_COMMUNITY): Payer: Self-pay | Admitting: Emergency Medicine

## 2013-01-15 DIAGNOSIS — I10 Essential (primary) hypertension: Secondary | ICD-10-CM | POA: Insufficient documentation

## 2013-01-15 DIAGNOSIS — F1910 Other psychoactive substance abuse, uncomplicated: Secondary | ICD-10-CM

## 2013-01-15 DIAGNOSIS — F411 Generalized anxiety disorder: Secondary | ICD-10-CM | POA: Insufficient documentation

## 2013-01-15 DIAGNOSIS — Z3202 Encounter for pregnancy test, result negative: Secondary | ICD-10-CM | POA: Insufficient documentation

## 2013-01-15 DIAGNOSIS — F1410 Cocaine abuse, uncomplicated: Secondary | ICD-10-CM | POA: Insufficient documentation

## 2013-01-15 DIAGNOSIS — Z79899 Other long term (current) drug therapy: Secondary | ICD-10-CM | POA: Insufficient documentation

## 2013-01-15 DIAGNOSIS — F1010 Alcohol abuse, uncomplicated: Secondary | ICD-10-CM | POA: Insufficient documentation

## 2013-01-15 DIAGNOSIS — F419 Anxiety disorder, unspecified: Secondary | ICD-10-CM

## 2013-01-15 DIAGNOSIS — R209 Unspecified disturbances of skin sensation: Secondary | ICD-10-CM | POA: Insufficient documentation

## 2013-01-15 DIAGNOSIS — F17200 Nicotine dependence, unspecified, uncomplicated: Secondary | ICD-10-CM | POA: Insufficient documentation

## 2013-01-15 DIAGNOSIS — R064 Hyperventilation: Secondary | ICD-10-CM | POA: Insufficient documentation

## 2013-01-15 DIAGNOSIS — R002 Palpitations: Secondary | ICD-10-CM | POA: Insufficient documentation

## 2013-01-15 LAB — CBC
HCT: 36.2 % (ref 36.0–46.0)
Hemoglobin: 11.8 g/dL — ABNORMAL LOW (ref 12.0–15.0)
RBC: 4.37 MIL/uL (ref 3.87–5.11)
RDW: 15.9 % — ABNORMAL HIGH (ref 11.5–15.5)
WBC: 5.9 10*3/uL (ref 4.0–10.5)

## 2013-01-15 LAB — COMPREHENSIVE METABOLIC PANEL
Albumin: 3.7 g/dL (ref 3.5–5.2)
Alkaline Phosphatase: 109 U/L (ref 39–117)
BUN: 10 mg/dL (ref 6–23)
CO2: 24 mEq/L (ref 19–32)
Chloride: 103 mEq/L (ref 96–112)
GFR calc non Af Amer: >90 mL/min (ref >90)
Potassium: 3.7 mEq/L (ref 3.5–5.1)
Total Bilirubin: 0.3 mg/dL (ref 0.3–1.2)

## 2013-01-15 LAB — URINALYSIS, ROUTINE W REFLEX MICROSCOPIC
Glucose, UA: NEGATIVE mg/dL
Leukocytes, UA: NEGATIVE
Protein, ur: NEGATIVE mg/dL
Urobilinogen, UA: 0.2 mg/dL (ref 0.0–1.0)

## 2013-01-15 LAB — ETHANOL: Alcohol, Ethyl (B): 12 mg/dL — ABNORMAL HIGH (ref 0–11)

## 2013-01-15 LAB — PREGNANCY, URINE: Preg Test, Ur: NEGATIVE

## 2013-01-15 LAB — RAPID URINE DRUG SCREEN, HOSP PERFORMED
Amphetamines: NOT DETECTED
Tetrahydrocannabinol: NOT DETECTED

## 2013-01-15 MED ORDER — LORAZEPAM 1 MG PO TABS
1.0000 mg | ORAL_TABLET | Freq: Once | ORAL | Status: AC
  Administered 2013-01-15: 1 mg via ORAL
  Filled 2013-01-15: qty 1

## 2013-01-15 MED ORDER — LORAZEPAM 1 MG PO TABS: 1.0000 mg | ORAL_TABLET | Freq: Two times a day (BID) | ORAL | Status: DC | PRN

## 2013-01-15 MED ORDER — ASPIRIN EC 325 MG PO TBEC
325.0000 mg | DELAYED_RELEASE_TABLET | Freq: Once | ORAL | Status: AC
  Administered 2013-01-15: 325 mg via ORAL
  Filled 2013-01-15: qty 1

## 2013-01-15 NOTE — ED Notes (Signed)
Talked with pt at length RE: no further alcohol intake esp. While taking ativan/AA meetings mandantory/ watching for signs of DT's or increased neuro sx. Mom w/ pt and aware of all of this. Pt asked if she could go have a beer tonight. Emphatic NO was response from both Mom and me.

## 2013-01-15 NOTE — ED Provider Notes (Signed)
History    This chart was scribed for Vida Roller, MD by Bennett Scrape, ED Scribe. This patient was seen in room APA19/APA19 and the patient's care was started at 4:21 PM.  CSN: [id number]  Arrival date & time 01/15/13  1550   First MD Initiated Contact with Patient 01/15/13 1615      Chief Complaint  Patient presents with  . Anxiety     The history is provided by the patient. No language interpreter was used.    HPI Comments: [name] is a 27 y.o. female who presents to the Emergency Department complaining of anxiety described as "freaking out" with the associated symptom of paresthesia in her limbs, palpitations and hyperventilating that began today 4 and a half hours ago. Pt reports she was relaxing in bed doing daily activities of living when her feelings of anxiety began. She denies having any stress currently. She denies hallucinations, SI, and HI. The pt has a history of HTN, and states that today she took double her normal HTN medication ( 0.2 mg instead of 0.1 mg) due to inattention. The pt admits to drinking three cans of 40 oz beers a day. She states that she feels like she has to drink, and her last drink was at 5 am this morning when she awoke and realized that she still had beer left to drink. The pt also admits to using cocaine, and last smoked cocaine two days ago.  She denies IV drug use. The pt also denies use of prescription drugs as well as marijuana use. She denies any other prior psychiatric diagnoses. She reports a family history of anxiety on both maternal and paternal sides including her mother and father. The pt's mother took xanax for 12 years for anxiety and admits to marijuana usage but denies alcohol or cocaine use. Pt's father is an alcoholic. The pt has children, but they are not in her custody. She has had five cesarean sections and 3 tubal ligation surgeries and denies the possibility of pregnancy.  Past Medical History  Diagnosis Date  .  Hypertension   . Anxiety     Past Surgical History  Procedure Laterality Date  . Artery repair    . Cesarean section      x 5  . Tubal ligation      History reviewed. No pertinent family history.  History  Substance Use Topics  . Smoking status: Current Every Day Smoker    Types: Cigarettes  . Smokeless tobacco: Not on file  . Alcohol Use: Yes     Comment: beer daily  Unemployed, lives with boyfriend  Review of Systems  Cardiovascular: Positive for palpitations. Negative for chest pain.  Psychiatric/Behavioral: Negative for suicidal ideas. The patient is nervous/anxious.   All other systems reviewed and are negative.    Allergies  Acetaminophen; Codeine; and Penicillins  Home Medications   Current Outpatient Rx  Name  Route  Sig  Dispense  Refill  . cloNIDine (CATAPRES) 0.2 MG tablet   Oral   Take 0.5 tablets (0.1 mg total) by mouth 2 (two) times daily.   60 tablet   2   . Aspirin-Acetaminophen-Caffeine (GOODY HEADACHE PO)   Oral   Take 1 each by mouth daily as needed. pain         . LORazepam (ATIVAN) 1 MG tablet   Oral   Take 1 tablet (1 mg total) by mouth 2 (two) times daily as needed for anxiety.   10 tablet  0   . naproxen (NAPROSYN) 375 MG tablet   Oral   Take 1 tablet (375 mg total) by mouth 2 (two) times daily.   20 tablet   0     BP 179/115  Pulse 85  Temp(Src) 99 F (37.2 C) (Oral)  Resp 20  SpO2 100%  LMP 01/01/2013  Physical Exam  Nursing note and vitals reviewed. Constitutional: She is oriented to person, place, and time. She appears well-developed and well-nourished. No distress.  HENT:  Head: Normocephalic and atraumatic.  Right Ear: External ear normal.  Left Ear: External ear normal.  Mouth/Throat: Oropharynx is clear and moist.  Eyes: EOM are normal. Pupils are equal, round, and reactive to light.  Neck: Neck supple. No tracheal deviation present.  Cardiovascular: Normal rate and regular rhythm.   Pulmonary/Chest:  Effort normal and breath sounds normal. No respiratory distress.  Abdominal: Soft.  Musculoskeletal: Normal range of motion. She exhibits no edema.  Neurological: She is alert and oriented to person, place, and time.  Skin: Skin is warm and dry.  Psychiatric: Her behavior is normal. Her mood appears anxious. She does not exhibit a depressed mood. She expresses no homicidal and no suicidal ideation.  Anxious and appears jittery.     ED Course  Procedures (including critical care time)  DIAGNOSTIC STUDIES: Oxygen Saturation is 100% on room air, normal by my interpretation.    COORDINATION OF CARE:  4:26 PM- Discussed treatment plan with pt which includes labs. Explained to pt that what she is experiencing is most likely an anxiety attack and encouraged pt to stop using cocaine and alcohol. Told pt that she was an alcoholic. Pt agreed to plan.   6:35 PM- Rechecked pt. The pt asked for anxiety medication, and I advised pt's mother that she (the mother) must control the medication and that the pt cannot drink while on the medicine. Also discussed the pt's anxieties related to having high blood pressure, and I informed the pt that I would check her medicines. Pt agreed to plan.   Labs Reviewed  CBC - Abnormal; Notable for the following:    Hemoglobin 11.8 (*)    RDW 15.9 (*)    All other components within normal limits  COMPREHENSIVE METABOLIC PANEL - Abnormal; Notable for the following:    Glucose, Bld 104 (*)    All other components within normal limits  ETHANOL - Abnormal; Notable for the following:    Alcohol, Ethyl (B) 12 (*)    All other components within normal limits  URINE RAPID DRUG SCREEN (HOSP PERFORMED) - Abnormal; Notable for the following:    Cocaine POSITIVE (*)    All other components within normal limits  URINALYSIS, ROUTINE W REFLEX MICROSCOPIC - Abnormal; Notable for the following:    Color, Urine STRAW (*)    All other components within normal limits  SALICYLATE  LEVEL - Abnormal; Notable for the following:    Salicylate Lvl <2.0 (*)    All other components within normal limits  PREGNANCY, URINE  ACETAMINOPHEN LEVEL   No results found.   1. Anxiety   2. Substance abuse       MDM  The patient has very clear anxiety and panic symptoms. She will need recheck of her blood pressure but does take clonidine daily for her blood pressure. She has not missed any doses. I will instruct her to take one additional dose of this medication when she gets home secondary to her blood pressure if it is  still elevated at discharge. She has improved significantly after one dose of Ativan. Her laboratory workup is otherwise benign other than her cocaine abuse and frequent alcohol use. I have instructed her that she needs close follow up with therapy or psychiatry and that she needs to stop drinking alcohol. She has been given resource list for substance abuse and will be given a small prescription of Ativan and will be controlled by her mother so that the patient cannot take it while she is drinking. The patient and her mother are in agreement with followup plan.  Meds given in ED:  Medications  LORazepam (ATIVAN) tablet 1 mg (1 mg Oral Given 01/15/13 1637)  aspirin EC tablet 325 mg (325 mg Oral Given 01/15/13 1749)    New Prescriptions   LORAZEPAM (ATIVAN) 1 MG TABLET    Take 1 tablet (1 mg total) by mouth 2 (two) times daily as needed for anxiety.     I personally performed the services described in this documentation, which was scribed in my presence. The recorded information has been reviewed and is accurate.     Vida Roller, MD 01/15/13 [phone]

## 2013-01-15 NOTE — ED Notes (Signed)
Pt c/o tingling, chest and stomach getting tight. Head pressure under eyes. Pt has hx of panic attacks. Pt states "im freaking out". Anxiety noted. Mother with pt. hyperventilating at times. Alert/oriented. Restless at times.

## 2013-01-15 NOTE — ED Notes (Signed)
Pt mother and boyfriend in room to visit. Pt and mother out of room in hallway c/o feeling more panicky, "like my head is going to explode". Both directed back to room. edp notified. Lights turned down, tv off, stimuli decreased.

## 2013-02-11 ENCOUNTER — Emergency Department (HOSPITAL_COMMUNITY)
Admission: EM | Admit: 2013-02-11 | Discharge: 2013-02-11 | Disposition: A | Discharge status: Home / Self Care | Payer: Self-pay | Attending: Emergency Medicine | Admitting: Emergency Medicine

## 2013-02-11 ENCOUNTER — Encounter (HOSPITAL_COMMUNITY): Payer: Self-pay | Admitting: *Deleted

## 2013-02-11 DIAGNOSIS — F411 Generalized anxiety disorder: Secondary | ICD-10-CM | POA: Insufficient documentation

## 2013-02-11 DIAGNOSIS — F17200 Nicotine dependence, unspecified, uncomplicated: Secondary | ICD-10-CM | POA: Insufficient documentation

## 2013-02-11 DIAGNOSIS — Z880 Allergy status to penicillin: Secondary | ICD-10-CM | POA: Insufficient documentation

## 2013-02-11 DIAGNOSIS — I10 Essential (primary) hypertension: Secondary | ICD-10-CM | POA: Insufficient documentation

## 2013-02-11 DIAGNOSIS — R42 Dizziness and giddiness: Secondary | ICD-10-CM | POA: Insufficient documentation

## 2013-02-11 DIAGNOSIS — Z79899 Other long term (current) drug therapy: Secondary | ICD-10-CM | POA: Insufficient documentation

## 2013-02-11 DIAGNOSIS — F419 Anxiety disorder, unspecified: Secondary | ICD-10-CM

## 2013-02-11 LAB — CBC WITH DIFFERENTIAL/PLATELET
Basophils Absolute: 0 10*3/uL (ref 0.0–0.1)
Eosinophils Absolute: 0.1 10*3/uL (ref 0.0–0.7)
Eosinophils Relative: 1 % (ref 0–5)
Lymphocytes Relative: 11 % — ABNORMAL LOW (ref 12–46)
MCH: 27.8 pg (ref 26.0–34.0)
MCV: 83.6 fL (ref 78.0–100.0)
Neutrophils Relative %: 81 % — ABNORMAL HIGH (ref 43–77)
Platelets: 342 10*3/uL (ref 150–400)
RDW: 16 % — ABNORMAL HIGH (ref 11.5–15.5)
WBC: 7.8 10*3/uL (ref 4.0–10.5)

## 2013-02-11 LAB — ETHANOL: Alcohol, Ethyl (B): <11 mg/dL (ref 0–11)

## 2013-02-11 LAB — BASIC METABOLIC PANEL
CO2: 25 mEq/L (ref 19–32)
Calcium: 9.4 mg/dL (ref 8.4–10.5)
Creatinine, Ser: 0.85 mg/dL (ref 0.50–1.10)
GFR calc non Af Amer: >90 mL/min (ref >90)
Sodium: 142 mEq/L (ref 135–145)

## 2013-02-11 LAB — RAPID URINE DRUG SCREEN, HOSP PERFORMED: Opiates: NOT DETECTED

## 2013-02-11 LAB — TROPONIN I: Troponin I: <0.3 ng/mL (ref <0.30)

## 2013-02-11 MED ORDER — ALPRAZOLAM 0.5 MG PO TABS: 0.5000 mg | ORAL_TABLET | Freq: Two times a day (BID) | ORAL | Status: DC | PRN

## 2013-02-11 MED ORDER — CLONIDINE HCL 0.2 MG PO TABS: 0.1000 mg | ORAL_TABLET | Freq: Two times a day (BID) | ORAL | Status: DC

## 2013-02-11 MED ORDER — LISINOPRIL-HYDROCHLOROTHIAZIDE 10-12.5 MG PO TABS: 1.0000 | ORAL_TABLET | Freq: Every day | ORAL | Status: DC

## 2013-02-11 MED ORDER — ALPRAZOLAM 0.5 MG PO TABS
0.5000 mg | ORAL_TABLET | Freq: Once | ORAL | Status: AC
  Administered 2013-02-11: 0.5 mg via ORAL
  Filled 2013-02-11: qty 1

## 2013-02-11 NOTE — ED Notes (Signed)
Felissa from [facility] in to see pt per Dr Adriana Simas request.

## 2013-02-11 NOTE — BH Assessment (Signed)
Assessment Note   [name] is an 27 y.o. female. Dr. Adriana Simas requests ACT to see patient due to panic disorder. Patient reports having panic attacks daily. Patient reports that she is having difficulty with day to day activities due to the panic attacks. She states that she is not able to function normally. There doesn't appear to be any issues concerning agoraphobia. She states the attacks are getting more intense, and more frequent. She denies any suicidal or homicidal ideation. There are no delusions are hallucinations noted. Patient is requesting a refill on Xanax, stating that she has an appointment at the [facility], but it is not for 3 months. She states she has no insurance, therefore accessing services is difficult and paying for the medication is a challenge. Explained to her how [employer], the LME/MCO works, including [employer] to pay for indigent care. Gave her a printout of other providers, mainly in [address], who would probably be able to see her earlier but she has to enroll with [employer] to get the care covered. Gave the patient the phone number to [facility]. Patient agreed to contact them for enrollment and referral to a provider who could meet her need in outpatient, as she does not meet criteria for inpatient services. Noted that she is positive cocaine in her UDS. May need to also be seen for substance abuse counseling.   Axis I: Panic Disorder; Cocaine Abuse Axis II: Deferred Axis III: HTN Axis IV: Financial issues which impede her ability to appropriately access health care Axis V: GAF 70-79  Past Medical History:  Past Medical History  Diagnosis Date  . Hypertension   . Anxiety     Past Surgical History  Procedure Laterality Date  . Artery repair    . Cesarean section      x 5  . Tubal ligation      Family History: No family history on file.  Social History:  reports that she has been smoking Cigarettes.  She  has been smoking about 0.00 packs per day. She does not have any smokeless tobacco history on file. She reports that  drinks alcohol. She reports that she uses illicit drugs (Cocaine).  Additional Social History:     CIWA: CIWA-Ar BP: 166/104 mmHg Pulse Rate: 62 COWS:    Allergies:  Allergies  Allergen Reactions  . Acetaminophen     Causes  patient to have an upset stomach  . Codeine Hives and Itching  . Penicillins Hives and Swelling    Home Medications:  (Not in a hospital admission)  OB/GYN Status:  Patient's last menstrual period was 12/30/2012.  General Assessment Data Location of Assessment: [facility] ACT Assessment: Yes Living Arrangements: Spouse/significant other Can pt return to current living arrangement?: Yes Admission Status: Voluntary Is patient capable of signing voluntary admission?: Yes Transfer from: Home Referral Source: MD     Risk to self Suicidal Ideation: No Suicidal Intent: No Is patient at risk for suicide?: No Suicidal Plan?: No Access to Means: No What has been your use of drugs/alcohol within the last 12 months?:  (doesn't admit, positive UDS) Previous Attempts/Gestures: No Intentional Self Injurious Behavior: None Family Suicide History: No Recent stressful life event(s): Financial Problems Persecutory voices/beliefs?: No Depression: No Substance abuse history and/or treatment for substance abuse?: Yes Suicide prevention information given to non-admitted patients: Yes  Risk to Others Homicidal Ideation: No Thoughts of Harm to Others: No Current Homicidal Intent: No Current Homicidal Plan: No Access to  Homicidal Means: No History of harm to others?: No Assessment of Violence: None Noted Does patient have access to weapons?: No  Psychosis Hallucinations: None noted Delusions: None noted  Mental Status Report Appear/Hygiene: Other (Comment) Eye Contact:  (minial clothing; took off undergarment) Motor Activity: Freedom of  movement Speech: Logical/coherent Level of Consciousness: Alert Mood: Anxious Affect: Appropriate to circumstance Anxiety Level: Minimal Thought Processes: Relevant Judgement: Unimpaired Orientation: Person;Place;Time;Situation;Appropriate for developmental age  Cognitive Functioning Concentration: Decreased Memory: Recent Intact;Remote Intact IQ: Average Insight: Fair Impulse Control: Fair  ADLScreening [facility]) Patient's cognitive ability adequate to safely complete daily activities?: Yes Patient able to express need for assistance with ADLs?: Yes Independently performs ADLs?: Yes (appropriate for developmental age)  Abuse/Neglect [facility]) Physical Abuse: Denies Verbal Abuse: Denies Sexual Abuse: Denies  Prior Inpatient Therapy Prior Inpatient Therapy: No  Prior Outpatient Therapy Prior Outpatient Therapy: Yes Prior Therapy Dates:  (waiting for appt in 3 months) Prior Therapy Facilty/Provider(s):  [facility]) Reason for Treatment:  (panic d/o)  ADL Screening (condition at time of admission) Patient's cognitive ability adequate to safely complete daily activities?: Yes Patient able to express need for assistance with ADLs?: Yes Independently performs ADLs?: Yes (appropriate for developmental age)       Abuse/Neglect Assessment (Assessment to be complete while patient is alone) Physical Abuse: Denies Verbal Abuse: Denies Sexual Abuse: Denies Values / Beliefs Cultural Requests During Hospitalization: None Spiritual Requests During Hospitalization: None        Additional Information 1:1 In Past 12 Months?: No CIRT Risk: No Elopement Risk: No Does patient have medical clearance?: Yes     Disposition:  Disposition Initial Assessment Completed for this Encounter: Yes Disposition of Patient: Outpatient treatment Type of outpatient treatment: Adult  On Site Evaluation by:  Dr. Adriana Simas Reviewed with Physician:  Dr. Adriana Simas  Referred to  [employer] for enrollment into behavioral health services.    Shon Baton H 02/11/2013 7:08 PM

## 2013-02-11 NOTE — ED Notes (Signed)
Reports tingling all over body with difficulty breathing, over past few weeks, worse today.

## 2013-02-11 NOTE — ED Provider Notes (Signed)
History  This chart was scribed for Donnetta Hutching, MD, by Yevette Edwards, ED Scribe. This patient was seen in room [facility] and the patient's care was started at 4:29 PM. .  CSN: [id number]  Arrival date & time 02/11/13  1450   First MD Initiated Contact with Patient 02/11/13 1601      Chief Complaint  Patient presents with  . Tingling    (Consider location/radiation/quality/duration/timing/severity/associated sxs/prior treatment) The history is provided by the patient. No language interpreter was used.   HPI Comments: [name] is a 27 y.o. female who presents to the Emergency Department complaining of ongoing anxiety which occurs daily and has persisted for the past month. She has previously visited the ED for episodes of similar symptoms. The pt states that the anxiety causes her to feel as if she cannot breath, and it causes her to experience dizziness as well as tingling to her face, hands, and feet. Additionally, she reports that anxiety makes her fearful. She states that she normally drinks two to three 40 ounces of beer a day, though she denies drinking today, and she states that she drinks in order to temporarily alleviate her anxiety. She also admits to smoking half a pack a day. The pt has a h/o of HTN. She claims that the clonidine prescribed to her exacerbates her anxiety.   Past Medical History  Diagnosis Date  . Hypertension   . Anxiety     Past Surgical History  Procedure Laterality Date  . Artery repair    . Cesarean section      x 5  . Tubal ligation      No family history on file.  History  Substance Use Topics  . Smoking status: Current Every Day Smoker    Types: Cigarettes  . Smokeless tobacco: Not on file  . Alcohol Use: Yes     Comment: beer daily    No OB history provided.   Review of Systems  A complete 10 system review was obtained, and all systems are negative except where indicated in the HPI and PE.   Allergies  Acetaminophen;  Codeine; and Penicillins  Home Medications   Current Outpatient Rx  Name  Route  Sig  Dispense  Refill  . cloNIDine (CATAPRES) 0.2 MG tablet   Oral   Take 0.5 tablets (0.1 mg total) by mouth 2 (two) times daily.   60 tablet   2   . LORazepam (ATIVAN) 1 MG tablet   Oral   Take 1 tablet (1 mg total) by mouth 2 (two) times daily as needed for anxiety.   10 tablet   0   . Aspirin-Acetaminophen-Caffeine (GOODY HEADACHE PO)   Oral   Take 1 packet by mouth daily as needed. pain           Triage Vitals: BP 190/111  Pulse 96  Temp(Src) 98.5 F (36.9 C) (Oral)  Resp 20  Ht 5' (1.524 m)  Wt 140 lb (63.504 kg)  BMI 27.34 kg/m2  SpO2 100%  LMP 12/30/2012  Physical Exam  Nursing note and vitals reviewed. Constitutional: She is oriented to person, place, and time. She appears well-developed and well-nourished.  HENT:  Head: Normocephalic and atraumatic.  Eyes: Conjunctivae and EOM are normal. Pupils are equal, round, and reactive to light.  Neck: Normal range of motion. Neck supple.  Cardiovascular: Normal rate, regular rhythm and normal heart sounds.   Pulmonary/Chest: Effort normal and breath sounds normal.  Abdominal: Soft. Bowel sounds are normal.  Musculoskeletal: Normal range of motion.  Neurological: She is alert and oriented to person, place, and time.  Agitated.  Not confused. Speaks rapidly.   Skin: Skin is warm and dry.  Psychiatric:  Flight of ideas.     ED Course  Procedures (including critical care time)   COORDINATION OF CARE:  4:34 PM- Discussed treatment plan with pt which includes blood work and anxiety medicine, and pt agrees. The pt states that she does not want ativan.    DIAGNOSTIC STUDIES: Oxygen Saturation is 100% on room air, normal by my interpretation.    Labs Reviewed  CBC WITH DIFFERENTIAL - Abnormal; Notable for the following:    RDW 16.0 (*)    Neutrophils Relative % 81 (*)    Lymphocytes Relative 11 (*)    All other  components within normal limits  URINE RAPID DRUG SCREEN (HOSP PERFORMED) - Abnormal; Notable for the following:    Cocaine POSITIVE (*)    Benzodiazepines POSITIVE (*)    All other components within normal limits  BASIC METABOLIC PANEL  ETHANOL  TROPONIN I   No results found.   No diagnosis found.   Date: 02/11/2013  Rate: 67  Rhythm: normal sinus rhythm  QRS Axis: normal  Intervals: normal  ST/T Wave abnormalities: normal  Conduction Disutrbances: none  Narrative Interpretation: unremarkable      MDM  Behavioral health consult obtained.   No suicidal or homicidal ideation. Refill clonidine 0.1 mg twice a day for one month.   Also Rx Xanax 0.5 mg #15.   Referral to [facility]     I personally performed the services described in this documentation, which was scribed in my presence. The recorded information has been reviewed and is accurate.     Donnetta Hutching, MD 02/11/13 1930

## 2013-02-23 ENCOUNTER — Emergency Department (HOSPITAL_COMMUNITY)
Admission: EM | Admit: 2013-02-23 | Discharge: 2013-02-23 | Disposition: A | Discharge status: Home / Self Care | Payer: Self-pay | Attending: Emergency Medicine | Admitting: Emergency Medicine

## 2013-02-23 ENCOUNTER — Encounter (HOSPITAL_COMMUNITY): Payer: Self-pay | Admitting: *Deleted

## 2013-02-23 DIAGNOSIS — F410 Panic disorder [episodic paroxysmal anxiety] without agoraphobia: Secondary | ICD-10-CM | POA: Insufficient documentation

## 2013-02-23 DIAGNOSIS — Z3202 Encounter for pregnancy test, result negative: Secondary | ICD-10-CM | POA: Insufficient documentation

## 2013-02-23 DIAGNOSIS — F419 Anxiety disorder, unspecified: Secondary | ICD-10-CM

## 2013-02-23 DIAGNOSIS — R51 Headache: Secondary | ICD-10-CM | POA: Insufficient documentation

## 2013-02-23 DIAGNOSIS — Z880 Allergy status to penicillin: Secondary | ICD-10-CM | POA: Insufficient documentation

## 2013-02-23 DIAGNOSIS — F411 Generalized anxiety disorder: Secondary | ICD-10-CM | POA: Insufficient documentation

## 2013-02-23 DIAGNOSIS — F17200 Nicotine dependence, unspecified, uncomplicated: Secondary | ICD-10-CM | POA: Insufficient documentation

## 2013-02-23 DIAGNOSIS — Z79899 Other long term (current) drug therapy: Secondary | ICD-10-CM | POA: Insufficient documentation

## 2013-02-23 DIAGNOSIS — R450 Nervousness: Secondary | ICD-10-CM | POA: Insufficient documentation

## 2013-02-23 DIAGNOSIS — R112 Nausea with vomiting, unspecified: Secondary | ICD-10-CM | POA: Insufficient documentation

## 2013-02-23 DIAGNOSIS — I10 Essential (primary) hypertension: Secondary | ICD-10-CM | POA: Insufficient documentation

## 2013-02-23 LAB — COMPREHENSIVE METABOLIC PANEL
AST: 33 U/L (ref 0–37)
BUN: 11 mg/dL (ref 6–23)
CO2: 25 mEq/L (ref 19–32)
Chloride: 107 mEq/L (ref 96–112)
Creatinine, Ser: 0.67 mg/dL (ref 0.50–1.10)
GFR calc non Af Amer: >90 mL/min (ref >90)
Glucose, Bld: 110 mg/dL — ABNORMAL HIGH (ref 70–99)
Total Bilirubin: 0.2 mg/dL — ABNORMAL LOW (ref 0.3–1.2)

## 2013-02-23 LAB — CBC WITH DIFFERENTIAL/PLATELET
Basophils Absolute: 0 10*3/uL (ref 0.0–0.1)
HCT: 37.4 % (ref 36.0–46.0)
Hemoglobin: 12.7 g/dL (ref 12.0–15.0)
Lymphocytes Relative: 8 % — ABNORMAL LOW (ref 12–46)
Lymphs Abs: 0.7 10*3/uL (ref 0.7–4.0)
Monocytes Absolute: 0.8 10*3/uL (ref 0.1–1.0)
Monocytes Relative: 9 % (ref 3–12)
Neutro Abs: 7.2 10*3/uL (ref 1.7–7.7)
RBC: 4.55 MIL/uL (ref 3.87–5.11)
WBC: 8.9 10*3/uL (ref 4.0–10.5)

## 2013-02-23 LAB — URINALYSIS, ROUTINE W REFLEX MICROSCOPIC
Glucose, UA: 100 mg/dL — AB
Ketones, ur: NEGATIVE mg/dL
Leukocytes, UA: NEGATIVE
Protein, ur: NEGATIVE mg/dL

## 2013-02-23 MED ORDER — SODIUM CHLORIDE 0.9 % IV SOLN
INTRAVENOUS | Status: DC
  Administered 2013-02-23: 15:00:00 via INTRAVENOUS

## 2013-02-23 MED ORDER — LABETALOL HCL 5 MG/ML IV SOLN
20.0000 mg | Freq: Once | INTRAVENOUS | Status: AC
  Administered 2013-02-23: 20 mg via INTRAVENOUS
  Filled 2013-02-23: qty 4

## 2013-02-23 MED ORDER — DIPHENHYDRAMINE HCL 50 MG/ML IJ SOLN
25.0000 mg | Freq: Once | INTRAMUSCULAR | Status: AC
  Administered 2013-02-23: 25 mg via INTRAVENOUS
  Filled 2013-02-23: qty 1

## 2013-02-23 MED ORDER — LORAZEPAM 2 MG/ML IJ SOLN
1.0000 mg | Freq: Once | INTRAMUSCULAR | Status: AC
  Administered 2013-02-23: 1 mg via INTRAVENOUS
  Filled 2013-02-23: qty 1

## 2013-02-23 MED ORDER — ALPRAZOLAM 0.5 MG PO TABS: 0.5000 mg | ORAL_TABLET | Freq: Two times a day (BID) | ORAL | Status: DC | PRN

## 2013-02-23 MED ORDER — LABETALOL HCL 100 MG PO TABS: 100.0000 mg | ORAL_TABLET | Freq: Two times a day (BID) | ORAL | Status: DC

## 2013-02-23 MED ORDER — HALOPERIDOL LACTATE 5 MG/ML IJ SOLN
5.0000 mg | Freq: Once | INTRAMUSCULAR | Status: AC
  Administered 2013-02-23: 5 mg via INTRAVENOUS
  Filled 2013-02-23: qty 1

## 2013-02-23 NOTE — ED Notes (Signed)
Pt provided warm blanket.

## 2013-02-23 NOTE — ED Notes (Addendum)
Multiple complaints. Reports having panic attack, hx of same. Having headache, vomiting, anxiety, cp. bp 183/119 at triage. Reports falling 3 weeks ago and hitting her head and wants that checked too. Airway intact.

## 2013-02-23 NOTE — ED Notes (Signed)
Patient states she has been drinking large amounts of alcohol until 0300. She woke up around 0900 with panic attack, nausea and vomiting. She also states her boyfriend broke up with her.

## 2013-02-23 NOTE — ED Notes (Signed)
Pt's mother to desk asking how much longer pt will wait. Explained delays and informed number of people waiting in front of patient. Pts mother yelling at this RN states someone told me it was only going to be .

## 2013-02-23 NOTE — ED Provider Notes (Signed)
History    CSN: [id number] Arrival date & time 02/23/13  1229  First MD Initiated Contact with Patient 02/23/13 1418     Chief Complaint  Patient presents with  . Anxiety   (Consider location/radiation/quality/duration/timing/severity/associated sxs/prior Treatment) HPI Comments: Patient presents to ER for evaluation of panic attack. Patient reports that she has been having frequent recurrent panic attacks over the past couple of months. Patient reports that this started around the time she was prescribed clonidine for her hypertension and seems to occur whenever she takes the medicine. The patient has been feeling particularly poorly. She reports that she has had headache, nausea, vomiting. She hasn't been able to hold down her medications today.  Patient is a 27 y.o. female presenting with anxiety.  Anxiety Associated symptoms include headaches.   Past Medical History  Diagnosis Date  . Hypertension   . Anxiety    Past Surgical History  Procedure Laterality Date  . Artery repair    . Cesarean section      x 5  . Tubal ligation     History reviewed. No pertinent family history. History  Substance Use Topics  . Smoking status: Current Every Day Smoker    Types: Cigarettes  . Smokeless tobacco: Not on file  . Alcohol Use: Yes     Comment: beer daily   OB History   Grav Para Term Preterm Abortions TAB SAB Ect Mult Living                 Review of Systems  Gastrointestinal: Positive for nausea and vomiting.  Neurological: Positive for headaches.  Psychiatric/Behavioral: The patient is nervous/anxious.   All other systems reviewed and are negative.    Allergies  Acetaminophen; Codeine; and Penicillins  Home Medications   Current Outpatient Rx  Name  Route  Sig  Dispense  Refill  . ALPRAZolam (XANAX) 0.5 MG tablet   Oral   Take 1 tablet (0.5 mg total) by mouth 2 (two) times daily as needed for sleep.   15 tablet   0   . ALPRAZolam (XANAX) 0.5 MG tablet  Oral   Take 1 tablet (0.5 mg total) by mouth 2 (two) times daily as needed for anxiety.   15 tablet   0   . Aspirin-Acetaminophen-Caffeine (GOODY HEADACHE PO)   Oral   Take 1 packet by mouth daily as needed. pain         . cloNIDine (CATAPRES) 0.2 MG tablet   Oral   Take 0.5 tablets (0.1 mg total) by mouth 2 (two) times daily.   60 tablet   2   . lisinopril-hydrochlorothiazide (PRINZIDE) 10-12.5 MG per tablet   Oral   Take 1 tablet by mouth daily.   30 tablet   0   . LORazepam (ATIVAN) 1 MG tablet   Oral   Take 1 tablet (1 mg total) by mouth 2 (two) times daily as needed for anxiety.   10 tablet   0    BP 178/111  Pulse 89  Temp(Src) 99.7 F (37.6 C) (Oral)  Resp 22  SpO2 99%  LMP 12/30/2012 Physical Exam  Constitutional: She is oriented to person, place, and time. She appears well-developed and well-nourished. No distress.  HENT:  Head: Normocephalic and atraumatic.  Right Ear: Hearing normal.  Left Ear: Hearing normal.  Nose: Nose normal.  Mouth/Throat: Oropharynx is clear and moist and mucous membranes are normal.  Eyes: Conjunctivae and EOM are normal. Pupils are equal, round, and reactive to  light.  Neck: Normal range of motion. Neck supple.  Cardiovascular: Regular rhythm, S1 normal and S2 normal.  Exam reveals no gallop and no friction rub.   No murmur heard. Pulmonary/Chest: Effort normal and breath sounds normal. No respiratory distress. She exhibits no tenderness.  Abdominal: Soft. Normal appearance and bowel sounds are normal. There is no hepatosplenomegaly. There is no tenderness. There is no rebound, no guarding, no tenderness at McBurney's point and negative Murphy's sign. No hernia.  Musculoskeletal: Normal range of motion.  Neurological: She is alert and oriented to person, place, and time. She has normal strength. No cranial nerve deficit or sensory deficit. Coordination normal. GCS eye subscore is 4. GCS verbal subscore is 5. GCS motor subscore is  6.  Skin: Skin is warm, dry and intact. No rash noted. No cyanosis.  Psychiatric: Her speech is normal and behavior is normal. Thought content normal. Her mood appears anxious. She exhibits a depressed mood.    ED Course  Procedures (including critical care time) Labs Reviewed  CBC WITH DIFFERENTIAL - Abnormal; Notable for the following:    RDW 16.6 (*)    Neutrophils Relative % 81 (*)    Lymphocytes Relative 8 (*)    All other components within normal limits  COMPREHENSIVE METABOLIC PANEL - Abnormal; Notable for the following:    Glucose, Bld 110 (*)    ALT 37 (*)    Total Bilirubin 0.2 (*)    All other components within normal limits  URINALYSIS, ROUTINE W REFLEX MICROSCOPIC - Abnormal; Notable for the following:    Glucose, UA 100 (*)    Hgb urine dipstick LARGE (*)    All other components within normal limits  URINE MICROSCOPIC-ADD ON - Abnormal; Notable for the following:    Squamous Epithelial / LPF FEW (*)    All other components within normal limits  LIPASE, BLOOD  POCT PREGNANCY, URINE   No results found.  Diagnosis: 1. Anxiety attack 2. Uncontrolled hypertension 3. Alcoholism  MDM  Patient presents to ER with complaints of anxiety. Patient reports that she has had frequent recurrent symptoms in the legs the clonidine that was prescribed for her hypertension. Reviewing her records her previous visits to any pain hospital reveals that the patient has excessive alcohol intake, likely alcoholic. This is certainly adding to the patient's problems. She does very manipulative, as is her mother who accompanies her. They're making specific medication requests.   Patient's blood pressure treated with labetalol. She was given Ativan for her anxiety, although she did not want the Ativan, wanted something stronger.  Pressure improving. She tells me that her blood pressure is normally at 230-250/130-150. It will be prudent not to drop her to the normal range at this time. She cannot  take the clonidine because she says it causes her anxiety. I doubt that this is the cause of anxiety, but if she is not going to take the medicine service under different. She seemed to do well to labetalol here. To be prescribed by mouth labetalol.  Patient was offered workup for her alcoholism and anxiety. She does not want to stay here for that workup would be hospitalized currently. She is not homicidal or suicidal, to be discharged with outpatient followup.  Gilda Crease, MD 02/23/13 [phone]

## 2013-02-23 NOTE — ED Notes (Signed)
The pt was here for a panic attack 2 hours ago.  She does not have the money for the meds given

## 2013-02-23 NOTE — ED Notes (Signed)
Received call patient calling [facility] stating no one has checked on her. This is not accurate. Pt is sitting in front of this RN anxious, told multiple times and informed of delays. Vitals rechecked. Dr. Oletta Lamas aware.

## 2013-02-24 ENCOUNTER — Emergency Department (HOSPITAL_COMMUNITY)
Admission: EM | Admit: 2013-02-24 | Discharge: 2013-02-24 | Disposition: A | Discharge status: Home / Self Care | Payer: Self-pay | Attending: Emergency Medicine | Admitting: Emergency Medicine

## 2013-02-24 DIAGNOSIS — F419 Anxiety disorder, unspecified: Secondary | ICD-10-CM

## 2013-02-24 DIAGNOSIS — I10 Essential (primary) hypertension: Secondary | ICD-10-CM

## 2013-02-24 MED ORDER — PROPRANOLOL HCL 40 MG PO TABS: 40.0000 mg | ORAL_TABLET | Freq: Two times a day (BID) | ORAL | Status: DC

## 2013-02-24 MED ORDER — LORAZEPAM 1 MG PO TABS
1.0000 mg | ORAL_TABLET | Freq: Once | ORAL | Status: AC
  Administered 2013-02-24: 1 mg via ORAL
  Filled 2013-02-24: qty 1

## 2013-02-24 MED ORDER — ALPRAZOLAM 0.5 MG PO TABS: 0.5000 mg | ORAL_TABLET | Freq: Two times a day (BID) | ORAL | Status: DC | PRN

## 2013-02-24 NOTE — ED Notes (Signed)
Pts mother concerned that ativan may be causing Pts anxiety. Pt educated on effects and side effects of ativan and agreed to take ativan

## 2013-02-24 NOTE — ED Provider Notes (Signed)
History    CSN: [id number] Arrival date & time 02/23/13  2334  First MD Initiated Contact with Patient 02/24/13 0122     Chief Complaint  Patient presents with  . Panic Attack   (Consider location/radiation/quality/duration/timing/severity/associated sxs/prior Treatment) HPI 27 yo female presents to the ER with complaint of persistent htn, anxiety.  Pt was seen for same earlier in the evening, given rx for xanax and lopressor.  They report they are unable to afford the lopressor, and cannot fill the xanax by itself due to pharmacy policy.  Pt reports she is starting to get anxious again now that the haldol given to her earlier is wearing off.  Pt is unable to f/u with mental health or new pcm due to holiday weekend.  Pt's mother reports they are able to fill the xanax, as it costs about $6.  They would like a bp med on the $4 list.  Pt reports she has been anxious for months, since she has been on clonidine.    Past Medical History  Diagnosis Date  . Hypertension   . Anxiety    Past Surgical History  Procedure Laterality Date  . Artery repair    . Cesarean section      x 5  . Tubal ligation     No family history on file. History  Substance Use Topics  . Smoking status: Current Every Day Smoker    Types: Cigarettes  . Smokeless tobacco: Not on file  . Alcohol Use: Yes     Comment: beer daily   OB History   Grav Para Term Preterm Abortions TAB SAB Ect Mult Living                 Review of Systems  All other systems reviewed and are negative.    Allergies  Acetaminophen; Codeine; and Penicillins  Home Medications   Current Outpatient Rx  Name  Route  Sig  Dispense  Refill  . cloNIDine (CATAPRES) 0.2 MG tablet   Oral   Take 0.5 tablets (0.1 mg total) by mouth 2 (two) times daily.   60 tablet   2   . ALPRAZolam (XANAX) 0.5 MG tablet   Oral   Take 1 tablet (0.5 mg total) by mouth 2 (two) times daily as needed for sleep.   15 tablet   0   . propranolol  (INDERAL) 40 MG tablet   Oral   Take 1 tablet (40 mg total) by mouth 2 (two) times daily.   30 tablet   0    BP 164/97  Pulse 53  Temp(Src) 98.4 F (36.9 C)  Resp 20  SpO2 99%  LMP 12/30/2012 Physical Exam  Nursing note and vitals reviewed. Constitutional: She is oriented to person, place, and time. She appears well-developed and well-nourished. No distress.  HENT:  Head: Normocephalic and atraumatic.  Nose: Nose normal.  Mouth/Throat: Oropharynx is clear and moist.  Eyes: Conjunctivae and EOM are normal. Pupils are equal, round, and reactive to light.  Neck: Normal range of motion. Neck supple. No JVD present. No tracheal deviation present. No thyromegaly present.  Cardiovascular: Normal rate, regular rhythm, normal heart sounds and intact distal pulses.  Exam reveals no gallop and no friction rub.   No murmur heard. Pulmonary/Chest: Effort normal and breath sounds normal. No stridor. No respiratory distress. She has no wheezes. She has no rales. She exhibits no tenderness.  Abdominal: Soft. Bowel sounds are normal. She exhibits no distension and no mass. There  is no tenderness. There is no rebound and no guarding.  Musculoskeletal: Normal range of motion. She exhibits no edema and no tenderness.  Lymphadenopathy:    She has no cervical adenopathy.  Neurological: She is alert and oriented to person, place, and time. She exhibits normal muscle tone. Coordination normal.  Skin: Skin is warm and dry. No rash noted. No erythema. No pallor.  Psychiatric: Her behavior is normal. Judgment and thought content normal.  Flat affect    ED Course  Procedures (including critical care time) Labs Reviewed - No data to display No results found. 1. Anxiety   2. HTN (hypertension)     MDM  27 yo female with anxiety, htn.  Pt and mother seem manipulative, report she is able to take xanax but not ativan, and are not interested in starting SSRI at this time.  Pt given outpatient resources.   Will give propanolol rx, xanax.    Olivia Mackie, MD 02/24/13 [phone]

## 2013-07-30 ENCOUNTER — Emergency Department (HOSPITAL_COMMUNITY)
Admission: EM | Admit: 2013-07-30 | Discharge: 2013-07-30 | Disposition: A | Discharge status: Home / Self Care | Payer: Self-pay | Attending: Emergency Medicine | Admitting: Emergency Medicine

## 2013-07-30 ENCOUNTER — Encounter (HOSPITAL_COMMUNITY): Payer: Self-pay | Admitting: Emergency Medicine

## 2013-07-30 DIAGNOSIS — Z23 Encounter for immunization: Secondary | ICD-10-CM | POA: Insufficient documentation

## 2013-07-30 DIAGNOSIS — Z760 Encounter for issue of repeat prescription: Secondary | ICD-10-CM | POA: Insufficient documentation

## 2013-07-30 DIAGNOSIS — Y929 Unspecified place or not applicable: Secondary | ICD-10-CM | POA: Insufficient documentation

## 2013-07-30 DIAGNOSIS — S81009A Unspecified open wound, unspecified knee, initial encounter: Secondary | ICD-10-CM | POA: Insufficient documentation

## 2013-07-30 DIAGNOSIS — F17200 Nicotine dependence, unspecified, uncomplicated: Secondary | ICD-10-CM | POA: Insufficient documentation

## 2013-07-30 DIAGNOSIS — W540XXA Bitten by dog, initial encounter: Secondary | ICD-10-CM | POA: Insufficient documentation

## 2013-07-30 DIAGNOSIS — Z880 Allergy status to penicillin: Secondary | ICD-10-CM | POA: Insufficient documentation

## 2013-07-30 DIAGNOSIS — F411 Generalized anxiety disorder: Secondary | ICD-10-CM | POA: Insufficient documentation

## 2013-07-30 DIAGNOSIS — Z79899 Other long term (current) drug therapy: Secondary | ICD-10-CM | POA: Insufficient documentation

## 2013-07-30 DIAGNOSIS — Y939 Activity, unspecified: Secondary | ICD-10-CM | POA: Insufficient documentation

## 2013-07-30 DIAGNOSIS — I10 Essential (primary) hypertension: Secondary | ICD-10-CM | POA: Insufficient documentation

## 2013-07-30 MED ORDER — CLONIDINE HCL 0.1 MG PO TABS: 0.1000 mg | ORAL_TABLET | Freq: Two times a day (BID) | ORAL | Status: DC

## 2013-07-30 MED ORDER — KETOROLAC TROMETHAMINE 60 MG/2ML IM SOLN
60.0000 mg | Freq: Once | INTRAMUSCULAR | Status: AC
  Administered 2013-07-30: 60 mg via INTRAMUSCULAR
  Filled 2013-07-30: qty 2

## 2013-07-30 MED ORDER — TETANUS-DIPHTH-ACELL PERTUSSIS 5-2.5-18.5 LF-MCG/0.5 IM SUSP
0.5000 mL | Freq: Once | INTRAMUSCULAR | Status: AC
  Administered 2013-07-30: 0.5 mL via INTRAMUSCULAR
  Filled 2013-07-30: qty 0.5

## 2013-07-30 MED ORDER — ALPRAZOLAM 0.5 MG PO TABS: 0.5000 mg | ORAL_TABLET | Freq: Two times a day (BID) | ORAL | Status: DC | PRN

## 2013-07-30 MED ORDER — SULFAMETHOXAZOLE-TRIMETHOPRIM 800-160 MG PO TABS: 1.0000 | ORAL_TABLET | Freq: Two times a day (BID) | ORAL | Status: DC

## 2013-07-30 MED ORDER — LORAZEPAM 1 MG PO TABS
1.0000 mg | ORAL_TABLET | Freq: Once | ORAL | Status: AC
  Administered 2013-07-30: 1 mg via ORAL
  Filled 2013-07-30: qty 1

## 2013-07-30 MED ORDER — CLONIDINE HCL 0.1 MG PO TABS
0.1000 mg | ORAL_TABLET | Freq: Once | ORAL | Status: AC
  Administered 2013-07-30: 0.1 mg via ORAL
  Filled 2013-07-30: qty 1

## 2013-07-30 MED ORDER — PROPRANOLOL HCL 40 MG PO TABS
40.0000 mg | ORAL_TABLET | Freq: Once | ORAL | Status: AC
  Administered 2013-07-30: 40 mg via ORAL
  Filled 2013-07-30: qty 1

## 2013-07-30 MED ORDER — PROPRANOLOL HCL 40 MG PO TABS: 40.0000 mg | ORAL_TABLET | Freq: Two times a day (BID) | ORAL | Status: DC

## 2013-07-30 NOTE — ED Notes (Signed)
Pt from home w c/o anxiety, hypertension, and dog bite that occurred two days. Patient has been out of her anxiety medication for a month and out of her BP meds for a week. The dog bite is on her left posterior leg (one puncture wound noted with dried scab). She reports the dog is of a friend and is up to date on shots. Pain c/o pain of 8 located in ears and eyes.

## 2013-07-30 NOTE — ED Provider Notes (Signed)
CSN: [id number]     Arrival date & time 07/30/13  1228 History   First MD Initiated Contact with Patient 07/30/13 1314     Chief Complaint  Patient presents with  . Anxiety  . Hypertension  . Animal Bite   (Consider location/radiation/quality/duration/timing/severity/associated sxs/prior Treatment) HPI Comments: Patient is a 27 year old female with a past medical history of hypertension and anxiety who presents with anxiety, hypertension and a dog bite that occurred 2 days ago. Patient reports being out of all her medications which include clonidine, propranolol and xanax. Patient reports recently breaking up with her boyfriend and moved away from him and back to [place] with her mother. Patient was unable to refill her medications. She would also like to have her leg checked out where she was bit by her friend's dog. The dog bit her on the left calf. The dog belongs to a friend and is up to date on shots.   Patient is a 27 y.o. female presenting with anxiety, hypertension, and animal bite.  Anxiety Pertinent negatives include no abdominal pain, arthralgias, chest pain, chills, fatigue, fever, nausea, neck pain, vomiting or weakness.  Hypertension Pertinent negatives include no abdominal pain, arthralgias, chest pain, chills, fatigue, fever, nausea, neck pain, vomiting or weakness.  Animal Bite Associated symptoms: no fever     Past Medical History  Diagnosis Date  . Hypertension   . Anxiety    Past Surgical History  Procedure Laterality Date  . Artery repair    . Cesarean section      x 5  . Tubal ligation     History reviewed. No pertinent family history. History  Substance Use Topics  . Smoking status: Current Every Day Smoker    Types: Cigarettes  . Smokeless tobacco: Not on file  . Alcohol Use: Yes     Comment: beer daily   OB History   Grav Para Term Preterm Abortions TAB SAB Ect Mult Living                 Review of Systems  Constitutional: Negative for  fever, chills and fatigue.  HENT: Negative for trouble swallowing.   Eyes: Negative for visual disturbance.  Respiratory: Negative for shortness of breath.   Cardiovascular: Negative for chest pain and palpitations.  Gastrointestinal: Negative for nausea, vomiting, abdominal pain and diarrhea.  Genitourinary: Negative for dysuria and difficulty urinating.  Musculoskeletal: Negative for arthralgias and neck pain.  Skin: Positive for wound. Negative for color change.  Neurological: Negative for dizziness and weakness.  Psychiatric/Behavioral: Negative for dysphoric mood. The patient is nervous/anxious.     Allergies  Acetaminophen; Codeine; Lisinopril; and Penicillins  Home Medications   Current Outpatient Rx  Name  Route  Sig  Dispense  Refill  . ALPRAZolam (XANAX) 0.5 MG tablet   Oral   Take 1 tablet (0.5 mg total) by mouth 2 (two) times daily as needed for sleep.   15 tablet   0   . cloNIDine (CATAPRES) 0.1 MG tablet   Oral   Take 0.1 mg by mouth 2 (two) times daily.         . propranolol (INDERAL) 40 MG tablet   Oral   Take 1 tablet (40 mg total) by mouth 2 (two) times daily.   30 tablet   0    BP 220/132  Pulse 84  Temp(Src) 98.6 F (37 C) (Oral)  Resp 20  SpO2 98% Physical Exam  Nursing note and vitals reviewed. Constitutional: She is  oriented to person, place, and time. She appears well-developed and well-nourished. No distress.  HENT:  Head: Normocephalic and atraumatic.  Eyes: Conjunctivae and EOM are normal. Pupils are equal, round, and reactive to light.  Neck: Normal range of motion.  Cardiovascular: Normal rate and regular rhythm.  Exam reveals no gallop and no friction rub.   No murmur heard. Pulmonary/Chest: Effort normal and breath sounds normal. She has no wheezes. She has no rales. She exhibits no tenderness.  Abdominal: Soft. She exhibits no distension. There is no tenderness. There is no rebound and no guarding.  Musculoskeletal: Normal range  of motion.  Neurological: She is alert and oriented to person, place, and time. Coordination normal.  Speech is goal-oriented. Moves limbs without ataxia.   Skin: Skin is warm and dry.  Psychiatric: She has a normal mood and affect. Her behavior is normal.    ED Course  Procedures (including critical care time) Labs Review Labs Reviewed - No data to display Imaging Review No results found.  EKG Interpretation   None       MDM   1. Medication refill   2. Dog bite, initial encounter     1:22 PM Patient will have Clonidine, Propranolol, and ativan here. Patient hypertensive with other vitals stable. Patient will also be discharged with Bactrim for dog bite. Patient will have resource guide to find a PCP.     Emilia Beck, PA-C 07/30/13 1559

## 2013-07-30 NOTE — ED Provider Notes (Signed)
Medical screening examination/treatment/procedure(s) were performed by non-physician practitioner and as supervising physician I was immediately available for consultation/collaboration.  EKG Interpretation   None         Diago Haik, MD 07/30/13 1600 

## 2014-01-02 ENCOUNTER — Encounter (HOSPITAL_COMMUNITY): Payer: Self-pay | Admitting: Emergency Medicine

## 2014-01-02 ENCOUNTER — Emergency Department (HOSPITAL_COMMUNITY)
Admission: EM | Admit: 2014-01-02 | Discharge: 2014-01-02 | Disposition: A | Discharge status: Home / Self Care | Payer: Self-pay | Attending: Emergency Medicine | Admitting: Emergency Medicine

## 2014-01-02 ENCOUNTER — Emergency Department (HOSPITAL_COMMUNITY): Payer: Self-pay

## 2014-01-02 DIAGNOSIS — F410 Panic disorder [episodic paroxysmal anxiety] without agoraphobia: Secondary | ICD-10-CM | POA: Insufficient documentation

## 2014-01-02 DIAGNOSIS — Y929 Unspecified place or not applicable: Secondary | ICD-10-CM | POA: Insufficient documentation

## 2014-01-02 DIAGNOSIS — S199XXA Unspecified injury of neck, initial encounter: Secondary | ICD-10-CM

## 2014-01-02 DIAGNOSIS — Z7982 Long term (current) use of aspirin: Secondary | ICD-10-CM | POA: Insufficient documentation

## 2014-01-02 DIAGNOSIS — W1809XA Striking against other object with subsequent fall, initial encounter: Secondary | ICD-10-CM | POA: Insufficient documentation

## 2014-01-02 DIAGNOSIS — I10 Essential (primary) hypertension: Secondary | ICD-10-CM | POA: Insufficient documentation

## 2014-01-02 DIAGNOSIS — W06XXXA Fall from bed, initial encounter: Secondary | ICD-10-CM | POA: Insufficient documentation

## 2014-01-02 DIAGNOSIS — Z79899 Other long term (current) drug therapy: Secondary | ICD-10-CM | POA: Insufficient documentation

## 2014-01-02 DIAGNOSIS — Z880 Allergy status to penicillin: Secondary | ICD-10-CM | POA: Insufficient documentation

## 2014-01-02 DIAGNOSIS — F17200 Nicotine dependence, unspecified, uncomplicated: Secondary | ICD-10-CM | POA: Insufficient documentation

## 2014-01-02 DIAGNOSIS — S0990XA Unspecified injury of head, initial encounter: Secondary | ICD-10-CM | POA: Insufficient documentation

## 2014-01-02 DIAGNOSIS — S0101XA Laceration without foreign body of scalp, initial encounter: Secondary | ICD-10-CM

## 2014-01-02 DIAGNOSIS — S0993XA Unspecified injury of face, initial encounter: Secondary | ICD-10-CM | POA: Insufficient documentation

## 2014-01-02 DIAGNOSIS — Y9389 Activity, other specified: Secondary | ICD-10-CM | POA: Insufficient documentation

## 2014-01-02 DIAGNOSIS — S0100XA Unspecified open wound of scalp, initial encounter: Secondary | ICD-10-CM | POA: Insufficient documentation

## 2014-01-02 MED ORDER — TRAMADOL HCL 50 MG PO TABS
50.0000 mg | ORAL_TABLET | Freq: Four times a day (QID) | ORAL | Status: DC | PRN
Start: 2014-01-02 — End: 2014-02-15

## 2014-01-02 MED ORDER — LORAZEPAM 1 MG PO TABS
1.0000 mg | ORAL_TABLET | Freq: Once | ORAL | Status: AC
  Administered 2014-01-02: 1 mg via ORAL
  Filled 2014-01-02: qty 1

## 2014-01-02 MED ORDER — IBUPROFEN 800 MG PO TABS
800.0000 mg | ORAL_TABLET | Freq: Once | ORAL | Status: AC
Start: 2014-01-02 — End: 2014-01-02
  Administered 2014-01-02: 800 mg via ORAL
  Filled 2014-01-02: qty 1

## 2014-01-02 MED ORDER — CLONIDINE HCL 0.1 MG PO TABS: 0.1000 mg | ORAL_TABLET | Freq: Two times a day (BID) | ORAL | Status: DC

## 2014-01-02 MED ORDER — PROPRANOLOL HCL 40 MG PO TABS: 40.0000 mg | ORAL_TABLET | Freq: Two times a day (BID) | ORAL | Status: DC

## 2014-01-02 MED ORDER — ALPRAZOLAM 0.5 MG PO TABS: 0.5000 mg | ORAL_TABLET | Freq: Two times a day (BID) | ORAL | Status: DC | PRN

## 2014-01-02 MED ORDER — TRAMADOL HCL 50 MG PO TABS
50.0000 mg | ORAL_TABLET | Freq: Once | ORAL | Status: AC
  Administered 2014-01-02: 50 mg via ORAL
  Filled 2014-01-02: qty 1

## 2014-01-02 NOTE — ED Provider Notes (Signed)
CSN: [id number]     Arrival date & time 01/02/14  1309 History   First MD Initiated Contact with Patient 01/02/14 1501     Chief Complaint  Patient presents with  . Head Injury     (Consider location/radiation/quality/duration/timing/severity/associated sxs/prior Treatment) HPI [name] is a 28 y.o. female who presents to the history department with a complaint of a head injury. Pt states she fell out of her bed and hit her head on the corner of the bed frame. States there was laceration to the right side of the scalp which she stopped with pressure. States since then she has had intermittent blurred vision, dizziness, nausea. States has a headache. Also reports panic attack which she thinks are brought on by her pain. Pt states she currently does not have a PCP and ran out of all of her medications which includes propranolol, clonidine, and zanax. Pt states nothing makes her symptoms better or worse. States that she did not take anything for pain or anxiety today. Denies LOC. No numbness or weakness to extremities. No pain in the neck, back. No other complaings.   Past Medical History  Diagnosis Date  . Hypertension   . Anxiety    Past Surgical History  Procedure Laterality Date  . Artery repair    . Cesarean section      x 5  . Tubal ligation     No family history on file. History  Substance Use Topics  . Smoking status: Current Every Day Smoker    Types: Cigarettes  . Smokeless tobacco: Not on file  . Alcohol Use: Yes     Comment: beer daily   OB History   Grav Para Term Preterm Abortions TAB SAB Ect Mult Living                 Review of Systems  Constitutional: Negative for fever and chills.  Respiratory: Negative for cough, chest tightness and shortness of breath.   Cardiovascular: Negative for chest pain, palpitations and leg swelling.  Gastrointestinal: Negative for nausea, vomiting, abdominal pain and diarrhea.  Genitourinary: Negative for dysuria, flank  pain, vaginal bleeding and pelvic pain.  Musculoskeletal: Negative for arthralgias, myalgias, neck pain and neck stiffness.  Skin: Negative for rash.  Neurological: Positive for dizziness, light-headedness and headaches. Negative for syncope, weakness and numbness.  Psychiatric/Behavioral: The patient is nervous/anxious.   All other systems reviewed and are negative.     Allergies  Acetaminophen; Codeine; Lisinopril; and Penicillins  Home Medications   Prior to Admission medications   Medication Sig Start Date End Date Taking? Authorizing Provider  ALPRAZolam Prudy Feeler(XANAX) 0.5 MG tablet Take 1 tablet (0.5 mg total) by mouth 2 (two) times daily as needed for sleep. 07/30/13  Yes Kaitlyn Szekalski, PA-C  aspirin 325 MG tablet Take 325 mg by mouth every 6 (six) hours as needed for headache.   Yes Historical Provider, MD  cloNIDine (CATAPRES) 0.1 MG tablet Take 1 tablet (0.1 mg total) by mouth 2 (two) times daily. 07/30/13  Yes Kaitlyn Szekalski, PA-C  propranolol (INDERAL) 40 MG tablet Take 1 tablet (40 mg total) by mouth 2 (two) times daily. 07/30/13  Yes Kaitlyn Szekalski, PA-C  Cyanocobalamin (VITAMIN B-12 PO) Take 1 tablet by mouth daily.    Historical Provider, MD   BP 144/91  Pulse 88  Temp(Src) 98.2 F (36.8 C) (Oral)  Resp 20  SpO2 99%  LMP 12/22/2013 Physical Exam  Nursing note and vitals reviewed. Constitutional: She is oriented to  person, place, and time. She appears well-developed and well-nourished. No distress.  HENT:  Head: Normocephalic.  Eyes: Conjunctivae are normal.  Neck: Neck supple.  Cardiovascular: Normal rate, regular rhythm and normal heart sounds.   Pulmonary/Chest: Effort normal and breath sounds normal. No respiratory distress. She has no wheezes. She has no rales.  Abdominal: Soft. Bowel sounds are normal. She exhibits no distension. There is no tenderness. There is no rebound.  Musculoskeletal: She exhibits no edema.  Neurological: She is alert and oriented  to person, place, and time. No cranial nerve deficit. Coordination normal.  Skin: Skin is warm and dry.  1cm laceration to the right scalp. Hemostatic.  Psychiatric: She has a normal mood and affect. Her behavior is normal.    ED Course  Procedures (including critical care time) Labs Review Labs Reviewed - No data to display  Imaging Review Ct Head Wo Contrast  01/02/2014   CLINICAL DATA:  Head injury.  EXAM: CT HEAD WITHOUT CONTRAST  TECHNIQUE: Contiguous axial images were obtained from the base of the skull through the vertex without intravenous contrast.  COMPARISON:  None.  FINDINGS: Bony calvarium appears intact. No mass effect or midline shift is noted. Ventricular size is within normal limits. There is no evidence of mass lesion, hemorrhage or acute infarction.  IMPRESSION: Normal head CT.   Electronically Signed   By: Roque Lias M.D.   On: 01/02/2014 15:43     EKG Interpretation None      MDM   Final diagnoses:  Minor head injury  Scalp laceration    LACERATION REPAIR Performed by: Letrell Attwood A Marlowe Lawes Authorized by: Myriam Jacobson Samirah Scarpati Consent: Verbal consent obtained. Risks and benefits: risks, benefits and alternatives were discussed Consent given by: patient Patient identity confirmed: provided demographic data Prepped and Draped in normal sterile fashion Wound explored  Laceration Location: right scalp  Laceration Length: 1cm  No Foreign Bodies seen or palpated  Anesthesia: none  Irrigation method: syringe Amount of cleaning: standard  Skin closure: surgical staple  Number of sutures: 1  Patient tolerance: Patient tolerated the procedure well with no immediate complications.   Pt with head injury early this morning. Small laceration repaired with staple. Pt tolerated it well. Tetanus up to date. CT head obtained given pt's headache, dizziness, nausea, blurred vision, and is negative. Pt very anxious. Treated with ativan. Pt also asking for  refill on her bp medications and xanax. Pt seen by case manager, given follow up referrals. Home in stable condition.       Filed Vitals:   01/02/14 1318 01/02/14 1654  BP: 144/91 137/84  Pulse: 88 71  Temp: 98.2 F (36.8 C) 98.1 F (36.7 C)  TempSrc: Oral Oral  Resp: 20 20  SpO2: 99% 97%     Alyla Pietila A Ascher Schroepfer, PA-C 01/03/14 0007

## 2014-01-02 NOTE — Progress Notes (Signed)
[facility] provided pt with a list of primary care resources and a [facility] application to help patient establish primary care.  °

## 2014-01-02 NOTE — ED Notes (Signed)
Pt reports hitting coffee table at 0400 this am. Pt reports was not seen because it stopped bleeding but came now because pain is severe. Pt has hx of panic attacks and reports blurred vision due to "start of panic attack" related to pain she is experiencing.

## 2014-01-02 NOTE — ED Notes (Signed)
PA at bedside assessing posterior head.

## 2014-01-02 NOTE — Discharge Instructions (Signed)
Continue blood pressure medications. Ibuprofen for pain. Ultram for severe pain. Staple removal in 7-10 days. Follow up with a primary care doctor.    Concussion, Adult A concussion is a brain injury. It is caused by:  A hit to the head.  A quick and sudden movement (jolt) of the head or neck. A concussion is usually not life-threatening. Even so, it can cause serious problems. If you had a concussion before, you may have concussion-like problems after a hit to your head. HOME CARE General Instructions  Follow your doctor's directions carefully.  Take medicines only as told by your doctor.  Only take medicines your doctor says are safe.  Do not drink alcohol until your doctor says it is OK. Alcohol and some drugs can slow down healing. They can also put you at risk for further injury.  If your are having trouble remembering things, write them down.  Try to do one thing at a time if you get distracted easily. For example, do not watch TV while making dinner.  Talk to your family members or close friends when making important decisions.  Follow up with your doctor as told.  Watch your symptoms. Tell others to do the same. Serious problems can sometimes happen after a concussion. Older adults are more likely to have these problems.  Tell your teachers, school nurse, school counselor, coach, Event organiser, or work Production designer, theatre/television/film about your concussion. Tell them about what you can or cannot do. They should watch to see if:  It gets even harder for you to pay attention or concentrate.  It gets even harder for you to remember things or learn new things.  You need more time than normal to finish things.  You become annoyed (irritable) more than before.  You are not able to deal with stress as well.  You have more problems than before.  Rest. Make sure you:  Get plenty of sleep at night.  Go to sleep early.  Go to bed at the same time every day. Try to wake up at the same  time.  Rest during the day.  Take naps when you feel tired.  Limit activities where you have to think a lot or concentrate. These include:  Doing homework.  Doing work related to a job.  Watching TV.  Using the computer. Returning To Your Regular Activities Return to your normal activities slowly, not all at once. You must give your body and brain enough time to heal.   Do not play sports or do other athletic activities until your doctor says it is OK.  Ask your doctor when you can drive, ride a bicycle, or work other vehicles or machines. Never do these things if you feel dizzy.  Ask your doctor about when you can return to work or school. Preventing Another Concussion It is very important to avoid another brain injury, especially before you have healed. In rare cases, another injury can lead to permanent brain damage, brain swelling, or death. The risk of this is greatest during the first 7 10 after your injury. Avoid injuries by:   Wearing a seat belt when riding in a car.  Not drinking too much alcohol.  Avoiding activities that could lead to a second concussion (such as contact sports).  Wearing a helmet when doing activities like:  Biking.  Skiing.  Skateboarding.  Skating.  Making your home safer by:  Removing things from the floor or stairways that could make you trip.  Using grab bars  in bathrooms and handrails by stairs.  Placing non-slip mats on floors and in bathtubs.  Improve lighting in dark areas. GET HELP IF:  It gets even harder for you to pay attention or concentrate.  It gets even harder for you to remember things or learn new things.  You need more time than normal to finish things.  You become annoyed (irritable) more than before.  You are not able to deal with stress as well.  You have more problems than before.  You have problems keeping your balance.  You are not able to react quickly when you should. Get help if you have any  of these problems for more than 2 weeks:   Lasting (chronic) headaches.  Dizziness or trouble balancing.  Feeling sick to your stomach (nausea).  Seeing (vision) problems.  Being affected by noises or light more than normal.  Feeling sad, low, down in the dumps, blue, gloomy, or empty (depressed).  Mood changes (mood swings).  Feeling of fear or nervousness about what may happen (anxiety).  Feeling annoyed.  Memory problems.  Problems concentrating or paying attention.  Sleep problems.  Feeling tired all the time. GET HELP RIGHT AWAY IF:   You have bad headaches or your headaches get worse.  You have weakness (even if it is in one hand, leg, or part of the face).  You have loss of feeling (numbness).  You feel off balance.  You keep throwing up (vomiting).  You feel tired.  One black center of your eye (pupil) is larger than the other.  You twitch or shake violently (convulse).  Your speech is not clear (slurred).  You are more confused, easily angered (agitated), or annoyed than before.  You have more trouble resting than before.  You are unable to recognize people or places.  You have neck pain.  It is difficult to wake you up.  You have unusual behavior changes.  You pass out (lose consciousness). MAKE SURE YOU:   Understand these instructions.  Will watch your condition.  Will get help right away if you are not doing well or get worse. Document Released: 07/29/2009 Document Revised: 05/31/2013 Document Reviewed: 03/02/2013 [facility] Patient Information 2014 [employer], [place]. Staple Care and Removal Your caregiver has used staples today to repair your wound. Staples are used to help a wound heal faster by holding the edges of the wound together. The staples can be removed when the wound has healed well enough to stay together after the staples are removed. A dressing (wound covering), depending on the location of the wound, may have been applied.  This may be changed once per day or as instructed. If the dressing sticks, it may be soaked off with soapy water or hydrogen peroxide. Only take over-the-counter or prescription medicines for pain, discomfort, or fever as directed by your caregiver.  If you did not receive a tetanus shot today because you did not recall when your last one was given, check with your caregiver when you have your staples removed to determine if one is needed. Return to your caregiver's office in 1 week or as suggested to have your staples removed. SEEK IMMEDIATE MEDICAL CARE IF:   You have redness, swelling, or increasing pain in the wound.  You have pus coming from the wound.  You have a fever.  You notice a bad smell coming from the wound or dressing.  Your wound edges break open after staples have been removed. Document Released: 05/05/2001 Document Revised: 11/02/2011 Document  Reviewed: 05/20/2005 ExitCare Patient Information 2014 [place], [place].

## 2014-01-02 NOTE — ED Notes (Addendum)
Pt states she had been drinking around 0400, she hit head on the corner of coffee table. No bleeding noted, just dried up. States she is having a panic attack because of it. C/o blurred vision as well

## 2014-01-08 NOTE — ED Provider Notes (Signed)
Medical screening examination/treatment/procedure(s) were performed by non-physician practitioner and as supervising physician I was immediately available for consultation/collaboration.  Toy Baker, MD 01/08/14 [phone]

## 2014-02-15 ENCOUNTER — Emergency Department (HOSPITAL_COMMUNITY)
Admission: EM | Admit: 2014-02-15 | Discharge: 2014-02-15 | Disposition: A | Discharge status: Home / Self Care | Payer: Self-pay | Attending: Emergency Medicine | Admitting: Emergency Medicine

## 2014-02-15 ENCOUNTER — Encounter (HOSPITAL_COMMUNITY): Payer: Self-pay | Admitting: Emergency Medicine

## 2014-02-15 DIAGNOSIS — Z880 Allergy status to penicillin: Secondary | ICD-10-CM | POA: Insufficient documentation

## 2014-02-15 DIAGNOSIS — F17200 Nicotine dependence, unspecified, uncomplicated: Secondary | ICD-10-CM | POA: Insufficient documentation

## 2014-02-15 DIAGNOSIS — I10 Essential (primary) hypertension: Secondary | ICD-10-CM | POA: Insufficient documentation

## 2014-02-15 DIAGNOSIS — R002 Palpitations: Secondary | ICD-10-CM | POA: Insufficient documentation

## 2014-02-15 DIAGNOSIS — K047 Periapical abscess without sinus: Secondary | ICD-10-CM | POA: Insufficient documentation

## 2014-02-15 DIAGNOSIS — F419 Anxiety disorder, unspecified: Secondary | ICD-10-CM | POA: Insufficient documentation

## 2014-02-15 DIAGNOSIS — Z7982 Long term (current) use of aspirin: Secondary | ICD-10-CM | POA: Insufficient documentation

## 2014-02-15 DIAGNOSIS — Z79899 Other long term (current) drug therapy: Secondary | ICD-10-CM | POA: Insufficient documentation

## 2014-02-15 DIAGNOSIS — K029 Dental caries, unspecified: Secondary | ICD-10-CM | POA: Insufficient documentation

## 2014-02-15 DIAGNOSIS — F411 Generalized anxiety disorder: Secondary | ICD-10-CM | POA: Insufficient documentation

## 2014-02-15 MED ORDER — ALPRAZOLAM 0.5 MG PO TABS: 0.5000 mg | ORAL_TABLET | Freq: Three times a day (TID) | ORAL | Status: DC | PRN

## 2014-02-15 MED ORDER — CLINDAMYCIN HCL 300 MG PO CAPS
300.0000 mg | ORAL_CAPSULE | Freq: Once | ORAL | Status: AC
Start: 2014-02-15 — End: 2014-02-15
  Administered 2014-02-15: 300 mg via ORAL
  Filled 2014-02-15: qty 1

## 2014-02-15 MED ORDER — HYDROCODONE-ACETAMINOPHEN 5-325 MG PO TABS
1.0000 | ORAL_TABLET | Freq: Once | ORAL | Status: AC
  Administered 2014-02-15: 1 via ORAL
  Filled 2014-02-15: qty 1

## 2014-02-15 MED ORDER — CLONIDINE HCL 0.1 MG PO TABS
0.1000 mg | ORAL_TABLET | Freq: Once | ORAL | Status: AC
Start: 2014-02-15 — End: 2014-02-15
  Administered 2014-02-15: 0.1 mg via ORAL
  Filled 2014-02-15: qty 1

## 2014-02-15 MED ORDER — HYDROCODONE-ACETAMINOPHEN 5-325 MG PO TABS: 1.0000 | ORAL_TABLET | ORAL | Status: DC | PRN

## 2014-02-15 MED ORDER — PROPRANOLOL HCL 40 MG PO TABS: 40.0000 mg | ORAL_TABLET | Freq: Two times a day (BID) | ORAL | Status: DC

## 2014-02-15 MED ORDER — CLINDAMYCIN HCL 150 MG PO CAPS: 300.0000 mg | ORAL_CAPSULE | Freq: Three times a day (TID) | ORAL | Status: DC

## 2014-02-15 MED ORDER — CLONIDINE HCL 0.1 MG PO TABS: 0.1000 mg | ORAL_TABLET | Freq: Two times a day (BID) | ORAL | Status: DC

## 2014-02-15 MED ORDER — ALPRAZOLAM 0.5 MG PO TABS
0.5000 mg | ORAL_TABLET | Freq: Once | ORAL | Status: AC
  Administered 2014-02-15: 0.5 mg via ORAL
  Filled 2014-02-15: qty 1

## 2014-02-15 NOTE — ED Notes (Addendum)
Pt is asking to be discharged and for a sandwich.  Will notify PA.

## 2014-02-15 NOTE — ED Notes (Signed)
PA at bedside.

## 2014-02-15 NOTE — ED Notes (Signed)
Pt escorted to discharge window. Verbalized understanding discharge instructions. In no acute distress.   

## 2014-02-15 NOTE — ED Notes (Signed)
EDP at bedside  

## 2014-02-15 NOTE — ED Provider Notes (Signed)
CSN: [id number]     Arrival date & time 02/15/14  0341 History   First MD Initiated Contact with Patient 02/15/14 [phone]     Chief Complaint  Patient presents with  . Panic Attack  . Hypertension  . Dental Pain     (Consider location/radiation/quality/duration/timing/severity/associated sxs/prior Treatment) Patient is a 28 y.o. female presenting with anxiety. The history is provided by the patient. No language interpreter was used.  Anxiety This is a chronic problem. Pertinent negatives include no abdominal pain, chest pain, coughing, fever, myalgias, nausea, rash or sore throat. Associated symptoms comments: She presents with symptoms of her chronic anxiety that is currently untreated. She feels nervous, jittery. No chest pain or SOB. She recently moved back to [facility] and has no primary care for management of anxiety, or for hypertension and reports being out of all medications for the past several days. She also reports a painful swelling near an upper molar tooth that drains a foul tasting drainage and then re-accumulates. .    Past Medical History  Diagnosis Date  . Hypertension   . Anxiety    Past Surgical History  Procedure Laterality Date  . Artery repair    . Cesarean section      x 5  . Tubal ligation    . Mouth surgery    . Tear duct probing     Family History  Problem Relation Age of Onset  . CAD Other   . Diabetes Other   . Hypertension Other   . Cancer Other   . Thyroid disease Other    History  Substance Use Topics  . Smoking status: Current Every Day Smoker    Types: Cigarettes  . Smokeless tobacco: Not on file  . Alcohol Use: Yes     Comment: occ   OB History   Grav Para Term Preterm Abortions TAB SAB Ect Mult Living                 Review of Systems  Constitutional: Negative for fever.  HENT: Positive for dental problem. Negative for facial swelling, sore throat and trouble swallowing.   Respiratory: Negative for cough and shortness of breath.    Cardiovascular: Positive for palpitations. Negative for chest pain.  Gastrointestinal: Negative for nausea and abdominal pain.  Genitourinary: Negative.   Musculoskeletal: Negative for myalgias.  Skin: Negative for rash.      Allergies  Acetaminophen; Codeine; Lisinopril; and Penicillins  Home Medications   Prior to Admission medications   Medication Sig Start Date End Date Taking? Authorizing Hether Anselmo  ALPRAZolam Prudy Feeler) 0.5 MG tablet Take 1 tablet (0.5 mg total) by mouth 2 (two) times daily as needed for sleep. 07/30/13  Yes Kaitlyn Szekalski, PA-C  aspirin 325 MG tablet Take 325 mg by mouth every 6 (six) hours as needed for headache.   Yes Historical Reinhart Saulters, MD  cloNIDine (CATAPRES) 0.1 MG tablet Take 1 tablet (0.1 mg total) by mouth 2 (two) times daily. 07/30/13  Yes Kaitlyn Szekalski, PA-C  Cyanocobalamin (VITAMIN B-12 PO) Take 1 tablet by mouth daily.   Yes Historical Vito Beg, MD  ibuprofen (ADVIL,MOTRIN) 200 MG tablet Take 800 mg by mouth every 6 (six) hours as needed for moderate pain.   Yes Historical Pellegrino Kennard, MD  propranolol (INDERAL) 40 MG tablet Take 1 tablet (40 mg total) by mouth 2 (two) times daily. 07/30/13  Yes Kaitlyn Szekalski, PA-C   BP 187/124  Pulse 110  Temp(Src) 99.2 F (37.3 C) (Oral)  Resp 24  SpO2  98%  LMP 01/22/2014 Physical Exam  Constitutional: She is oriented to person, place, and time. She appears well-developed and well-nourished. No distress.  HENT:  Widespread dental decay. There is an abscess above #12 that is tender. Not currently draining.   Eyes: Conjunctivae are normal.  Neck: Normal range of motion.  Pulmonary/Chest: She has no wheezes. She has no rales.  Abdominal: Soft. There is no tenderness. There is no rebound.  Neurological: She is alert and oriented to person, place, and time.  Skin: Skin is warm and dry.  Psychiatric: She has a normal mood and affect.    ED Course  Procedures (including critical care time) Labs  Review Labs Reviewed - No data to display  Imaging Review No results found.   EKG Interpretation None      MDM   Final diagnoses:  None    1. Anxiety  2. Hypertension  Symptoms of anxiety are improved with medication. Blood pressure responding to Clonidine. Tachycardia, thought related to anxiety without SOB or chest pain, is improved. Will treat dental abscess and prescribe regular medication. Encouraged to follow up with resources to establish with primary care.     Arnoldo HookerShari A Upstill, PA-C 02/15/14 [phone]

## 2014-02-15 NOTE — ED Provider Notes (Signed)
Medical screening examination/treatment/procedure(s) were performed by non-physician practitioner and as supervising physician I was immediately available for consultation/collaboration.   EKG Interpretation None       Ethelda Chick, MD 02/15/14 [phone]

## 2014-02-15 NOTE — ED Notes (Signed)
Assumed care of patient--patient with multiple complaints Patient states that she is anxious and "having a panic attack" r/t not taking her medications Patient appears very anxious during assessment with pressured speech  Patient states that she does not have a PCP and needs a refill on all of her home medications--patient seen in ED last month for same complaint and was given Rx for medications  Patient also with c/o chronic left upper tooth pain--states she does not have money to see dentist

## 2014-02-15 NOTE — ED Notes (Signed)
Pt states she is having a panic attack and is "freaking out"  Pt states she does not have a lot of money and has not taken her medications in 3 days  Pt states she has a toothache on the upper left  Pt states she has hx of high blood pressure and has not taken those medications either  Pt is very anxious in words and actions in triage

## 2014-02-15 NOTE — Discharge Instructions (Signed)
Coronary Artery Disease, Risk Factors Research has shown that the risk of developing coronary artery disease (CAD) and having a heart attack increases with each factor you have. RISK FACTORS YOU CANNOT CHANGE  Your age. Your risk goes up as you get older. Most heart attacks happen to people over the age of 30.  Gender. Men have a greater risk of heart attack than women, and they have attacks earlier in life. However, women are more likely to die from a heart attack.  Heredity. Children of parents with heart disease are more likely to develop it themselves.  Race. African Americans and other ethnic groups have a higher risk, possibly because of high blood pressure, a tendency toward obesity, and diabetes.  Your family. Most people with a strong family history of heart disease have one or more other risk factors. RISK FACTORS YOU CAN CHANGE  Exposure to tobacco smoke. Even secondhand smoke greatly increases the risk for heart disease.  High blood cholesterol may be lowered with changes in diet, activity, and medicines.  High blood pressure makes the heart work harder. This causes the heart muscles to become thick and, eventually, weaker. It also increases your risk of stroke, heart attack, and kidney or heart failure.  Physical inactivity is a risk factor for CAD. Regular physical activity helps prevent heart and blood vessel disease. Exercise helps control blood cholesterol, diabetes, obesity, and it may help lower blood pressure in some people.  Excess body fat, especially belly fat, increases the risk of heart disease and stroke even if there are no other risk factors. Excess weight increases the heart's workload and raises blood pressure and blood cholesterol.  Diabetes seriously increases your risk of developing CAD. If you have diabetes, you should work with your caregiver to manage it and control other risk factors. OTHER RISK FACTORS FOR CAD  How you respond to stress.  Drinking  too much alcohol may raise blood pressure, cause heart failure, and lead to stroke.  Total cholesterol greater than 200 milligrams.  HDL (good) cholesterol less than 40 milligrams. HDL helps keep cholesterol from building up in the walls of the arteries. PREVENTING CAD  Maintain a healthy weight.  Exercise or do physical activity.  Eat a heart-healthy diet low in fat and salt and high in fiber.  Control your blood pressure to keep it below 120 over 80.  Keep your cholesterol at a level that lowers your risk.  Manage diabetes if you have it.  Stop smoking.  Learn how to manage stress. HEART SMART SUBSTITUTIONS  Instead of whole or 2% milk and cream, use skim milk.  Instead of fried foods, eat baked, steamed, boiled, broiled, or microwaved foods.  Instead of lard, butter, palm and coconut oils, cook with unsaturated vegetable oils, such as corn, olive, canola, safflower, sesame, soybean, sunflower, or peanut.  Instead of fatty cuts of meat, eat lean cuts of meat or cut off the fatty parts.  Instead of 1 whole egg in recipes, use 2 egg whites.  Instead of sauces, butter, and salt, season vegetables with herbs and spices.  Instead of regular hard and processed cheeses, eat low-fat, low-sodium cheeses.  Instead of salted potato chips, choose low-fat, unsalted tortilla and potato chips and unsalted pretzels and popcorn.  Instead of sour cream and mayonnaise, use plain low-fat yogurt, low-fat cottage cheese, or low-fat or "light" sour cream. FOR MORE INFORMATION  National Heart Lung and Blood Institute: https://nielsen.com/www.nhlbi.nih.gov/health/hearttruth American Heart Association: PopSteam.iswww.heart.org/HEARTORG Document Released: 10/31/2003 Document Revised: 11/02/2011  Document Reviewed: 10/26/2007 [facility] Patient Information 2015 [place], [place]. This information is not intended to replace advice given to you by your health care provider. Make sure you discuss any questions you have with your health  care provider. Hypertension Hypertension, commonly called high blood pressure, is when the force of blood pumping through your arteries is too strong. Your arteries are the blood vessels that carry blood from your heart throughout your body. A blood pressure reading consists of a higher number over a lower number, such as 110/72. The higher number (systolic) is the pressure inside your arteries when your heart pumps. The lower number (diastolic) is the pressure inside your arteries when your heart relaxes. Ideally you want your blood pressure below 120/80. Hypertension forces your heart to work harder to pump blood. Your arteries may become narrow or stiff. Having hypertension puts you at risk for heart disease, stroke, and other problems.  RISK FACTORS Some risk factors for high blood pressure are controllable. Others are not.  Risk factors you cannot control include:   Race. You may be at higher risk if you are African American.  Age. Risk increases with age.  Gender. Men are at higher risk than women before age 33 years. After age 34, women are at higher risk than men. Risk factors you can control include:  Not getting enough exercise or physical activity.  Being overweight.  Getting too much fat, sugar, calories, or salt in your diet.  Drinking too much alcohol. SIGNS AND SYMPTOMS Hypertension does not usually cause signs or symptoms. Extremely high blood pressure (hypertensive crisis) may cause headache, anxiety, shortness of breath, and nosebleed. DIAGNOSIS  To check if you have hypertension, your health care provider will measure your blood pressure while you are seated, with your arm held at the level of your heart. It should be measured at least twice using the same arm. Certain conditions can cause a difference in blood pressure between your right and left arms. A blood pressure reading that is higher than normal on one occasion does not mean that you need treatment. If one blood  pressure reading is high, ask your health care provider about having it checked again. TREATMENT  Treating high blood pressure includes making lifestyle changes and possibly taking medication. Living a healthy lifestyle can help lower high blood pressure. You may need to change some of your habits. Lifestyle changes may include:  Following the DASH diet. This diet is high in fruits, vegetables, and whole grains. It is low in salt, red meat, and added sugars.  Getting at least 2 1/2 hours of brisk physical activity every week.  Losing weight if necessary.  Not smoking.  Limiting alcoholic beverages.  Learning ways to reduce stress. If lifestyle changes are not enough to get your blood pressure under control, your health care provider may prescribe medicine. You may need to take more than one. Work closely with your health care provider to understand the risks and benefits. HOME CARE INSTRUCTIONS  Have your blood pressure rechecked as directed by your health care provider.   Only take medicine as directed by your health care provider. Follow the directions carefully. Blood pressure medicines must be taken as prescribed. The medicine does not work as well when you skip doses. Skipping doses also puts you at risk for problems.   Do not smoke.   Monitor your blood pressure at home as directed by your health care provider. SEEK MEDICAL CARE IF:   You think you are having  a reaction to medicines taken.  You have recurrent headaches or feel dizzy.  You have swelling in your ankles.  You have trouble with your vision. SEEK IMMEDIATE MEDICAL CARE IF:  You develop a severe headache or confusion.  You have unusual weakness, numbness, or feel faint.  You have severe chest or abdominal pain.  You vomit repeatedly.  You have trouble breathing. MAKE SURE YOU:   Understand these instructions.  Will watch your condition.  Will get help right away if you are not doing well or  get worse. Document Released: 08/10/2005 Document Revised: 08/15/2013 Document Reviewed: 06/02/2013 [facility] Patient Information 2015 [place], [place]. This information is not intended to replace advice given to you by your health care provider. Make sure you discuss any questions you have with your health care provider. Dental Abscess A dental abscess is a collection of infected fluid (pus) from a bacterial infection in the inner part of the tooth (pulp). It usually occurs at the end of the tooth's root.  CAUSES   Severe tooth decay.  Trauma to the tooth that allows bacteria to enter into the pulp, such as a broken or chipped tooth. SYMPTOMS   Severe pain in and around the infected tooth.  Swelling and redness around the abscessed tooth or in the mouth or face.  Tenderness.  Pus drainage.  Bad breath.  Bitter taste in the mouth.  Difficulty swallowing.  Difficulty opening the mouth.  Nausea.  Vomiting.  Chills.  Swollen neck glands. DIAGNOSIS   A medical and dental history will be taken.  An examination will be performed by tapping on the abscessed tooth.  X-rays may be taken of the tooth to identify the abscess. TREATMENT The goal of treatment is to eliminate the infection. You may be prescribed antibiotic medicine to stop the infection from spreading. A root canal may be performed to save the tooth. If the tooth cannot be saved, it may be pulled (extracted) and the abscess may be drained.  HOME CARE INSTRUCTIONS  Only take over-the-counter or prescription medicines for pain, fever, or discomfort as directed by your caregiver.  Rinse your mouth (gargle) often with salt water ( tsp salt in 8 oz [250 ml] of warm water) to relieve pain or swelling.  Do not drive after taking pain medicine (narcotics).  Do not apply heat to the outside of your face.  Return to your dentist for further treatment as directed. SEEK MEDICAL CARE IF:  Your pain is not helped by  medicine.  Your pain is getting worse instead of better. SEEK IMMEDIATE MEDICAL CARE IF:  You have a fever or persistent symptoms for more than 2-3 days.  You have a fever and your symptoms suddenly get worse.  You have chills or a very bad headache.  You have problems breathing or swallowing.  You have trouble opening your mouth.  You have swelling in the neck or around the eye. Document Released: 08/10/2005 Document Revised: 05/04/2012 Document Reviewed: 11/18/2010 [facility] Patient Information 2015 [place], [place]. This information is not intended to replace advice given to you by your health care provider. Make sure you discuss any questions you have with your health care provider.

## 2014-04-23 ENCOUNTER — Emergency Department (HOSPITAL_COMMUNITY)
Admission: EM | Admit: 2014-04-23 | Discharge: 2014-04-23 | Disposition: A | Discharge status: Home / Self Care | Payer: Self-pay | Source: Home / Self Care

## 2014-04-26 ENCOUNTER — Ambulatory Visit: Payer: Self-pay | Attending: Internal Medicine | Admitting: Internal Medicine

## 2014-04-26 ENCOUNTER — Encounter: Payer: Self-pay | Admitting: Internal Medicine

## 2014-04-26 VITALS — BP 145/106 | HR 98 | Temp 99.3°F | Resp 16 | Ht 60.0 in | Wt 153.0 lb

## 2014-04-26 DIAGNOSIS — Z87898 Personal history of other specified conditions: Secondary | ICD-10-CM

## 2014-04-26 DIAGNOSIS — I10 Essential (primary) hypertension: Secondary | ICD-10-CM

## 2014-04-26 DIAGNOSIS — F1410 Cocaine abuse, uncomplicated: Secondary | ICD-10-CM | POA: Insufficient documentation

## 2014-04-26 DIAGNOSIS — Z2821 Immunization not carried out because of patient refusal: Secondary | ICD-10-CM

## 2014-04-26 DIAGNOSIS — F1911 Other psychoactive substance abuse, in remission: Secondary | ICD-10-CM

## 2014-04-26 DIAGNOSIS — F17200 Nicotine dependence, unspecified, uncomplicated: Secondary | ICD-10-CM

## 2014-04-26 DIAGNOSIS — Z833 Family history of diabetes mellitus: Secondary | ICD-10-CM

## 2014-04-26 DIAGNOSIS — R1900 Intra-abdominal and pelvic swelling, mass and lump, unspecified site: Secondary | ICD-10-CM

## 2014-04-26 DIAGNOSIS — Z8249 Family history of ischemic heart disease and other diseases of the circulatory system: Secondary | ICD-10-CM | POA: Insufficient documentation

## 2014-04-26 DIAGNOSIS — Z809 Family history of malignant neoplasm, unspecified: Secondary | ICD-10-CM | POA: Insufficient documentation

## 2014-04-26 DIAGNOSIS — Z7189 Other specified counseling: Secondary | ICD-10-CM | POA: Insufficient documentation

## 2014-04-26 LAB — GLUCOSE, POCT (MANUAL RESULT ENTRY): POC Glucose: 131 mg/dl — AB (ref 70–99)

## 2014-04-26 LAB — POCT GLYCOSYLATED HEMOGLOBIN (HGB A1C): Hemoglobin A1C: 5.5

## 2014-04-26 MED ORDER — PROPRANOLOL HCL 40 MG PO TABS: 40.0000 mg | ORAL_TABLET | Freq: Two times a day (BID) | ORAL | Status: DC

## 2014-04-26 MED ORDER — CLONIDINE HCL 0.1 MG PO TABS: 0.1000 mg | ORAL_TABLET | Freq: Two times a day (BID) | ORAL | Status: DC

## 2014-04-26 MED ORDER — ESCITALOPRAM OXALATE 10 MG PO TABS: 10.0000 mg | ORAL_TABLET | Freq: Every day | ORAL | Status: DC

## 2014-04-26 MED ORDER — CLONIDINE HCL 0.1 MG PO TABS
0.1000 mg | ORAL_TABLET | Freq: Once | ORAL | Status: AC
  Administered 2014-04-26: 0.1 mg via ORAL

## 2014-04-26 NOTE — Progress Notes (Signed)
Patient ID: [name], female   DOB: 1986-03-09, 28 y.o.   MRN: [id number]  [id number]:[id number]  [id number]  DOB - [birth year]  CC:  Chief Complaint  Patient presents with  . Establish Care       HPI: [name] is a 28 y.o. female here today to establish medical care.  Patient reports that she has been on anti-hypertensives for the past three years.  She states that she has not had a PCP so she has been going back and forth to the ER for medication refills.  She has a past medical history of HTN and anxiety.  Patient c/o of weight gain for the past year.  Reports that her face tingles and feels like her chest is heavy.  She states that she often feels like she is having SOB.  Patient request xanax or clonazepam to help with anxiety. States that she has used multiple SSRI's in past and they did not help her and does not want to try them again.   Allergies  Allergen Reactions  . Acetaminophen     Causes  patient to have an upset stomach  . Codeine Hives and Itching  . Lisinopril Swelling  . Penicillins Hives and Swelling   Past Medical History  Diagnosis Date  . Hypertension   . Anxiety    Current Outpatient Prescriptions on File Prior to Visit  Medication Sig Dispense Refill  . ALPRAZolam (XANAX) 0.5 MG tablet Take 1 tablet (0.5 mg total) by mouth 2 (two) times daily as needed for sleep.  15 tablet  0  . aspirin 325 MG tablet Take 325 mg by mouth every 6 (six) hours as needed for headache.      . cloNIDine (CATAPRES) 0.1 MG tablet Take 1 tablet (0.1 mg total) by mouth 2 (two) times daily.  60 tablet  0  . propranolol (INDERAL) 40 MG tablet Take 1 tablet (40 mg total) by mouth 2 (two) times daily.  60 tablet  0  . ALPRAZolam (XANAX) 0.5 MG tablet Take 1 tablet (0.5 mg total) by mouth 3 (three) times daily as needed for anxiety.  15 tablet  0  . clindamycin (CLEOCIN) 150 MG capsule Take 2 capsules (300 mg total) by mouth 3 (three) times daily.  42 capsule  0  .  Cyanocobalamin (VITAMIN B-12 PO) Take 1 tablet by mouth daily.      Marland Kitchen HYDROcodone-acetaminophen (NORCO/VICODIN) 5-325 MG per tablet Take 1-2 tablets by mouth every 4 (four) hours as needed for moderate pain.  15 tablet  0  . ibuprofen (ADVIL,MOTRIN) 200 MG tablet Take 800 mg by mouth every 6 (six) hours as needed for moderate pain.      . [DISCONTINUED] labetalol (NORMODYNE) 100 MG tablet Take 1 tablet (100 mg total) by mouth 2 (two) times daily.  60 tablet  0   No current facility-administered medications on file prior to visit.   Family History  Problem Relation Age of Onset  . CAD Other   . Diabetes Other   . Hypertension Other   . Cancer Other   . Thyroid disease Other    History   Social History  . Marital Status: Single    Spouse Name: N/A    Number of Children: N/A  . Years of Education: N/A   Occupational History  . Not on file.   Social History Main Topics  . Smoking status: Current Every Day Smoker    Types: Cigarettes  . Smokeless tobacco: Not on file  .  Alcohol Use: Yes     Comment: occ  . Drug Use: No     Comment: last used 01/05/13  . Sexual Activity: Yes    Birth Control/ Protection: Surgical   Other Topics Concern  . Not on file   Social History Narrative  . No narrative on file   Review of Systems  Constitutional: Weight loss: weight gain.  Eyes: Positive for blurred vision and double vision.  Respiratory: Positive for shortness of breath (with anxiety). Negative for cough and wheezing.   Cardiovascular: Positive for palpitations and leg swelling. Negative for chest pain, orthopnea, claudication and PND.  Gastrointestinal: Positive for constipation.  Genitourinary: Negative.   Musculoskeletal: Negative.   Neurological: Positive for dizziness, tingling and headaches.  Endo/Heme/Allergies: Negative.   Psychiatric/Behavioral: Positive for substance abuse (reports it has been a month since use). The patient is nervous/anxious.        Objective:    Filed Vitals:   04/26/14 1405  BP: 164/129  Pulse: 98  Temp: 99.3 F (37.4 C)  Resp: 16   Physical Exam  HENT:  Right Ear: External ear normal.  Left Ear: External ear normal.  Mouth/Throat: Oropharynx is clear and moist.  Poor dentition  Eyes: Pupils are equal, round, and reactive to light.  Neck: Normal range of motion. Neck supple.  Cardiovascular: Normal rate, regular rhythm and normal heart sounds.   Abdominal: Soft. Bowel sounds are normal. She exhibits mass (right of umbilical region, freely moveable. Tender to palpate ). She exhibits no distension. There is tenderness. There is no rebound and no guarding. No hernia.  Skin: Skin is warm and dry.     Lab Results  Component Value Date   WBC 8.9 02/23/2013   HGB 12.7 02/23/2013   HCT 37.4 02/23/2013   MCV 82.2 02/23/2013   PLT 255 02/23/2013   Lab Results  Component Value Date   CREATININE 0.67 02/23/2013   BUN 11 02/23/2013   NA 141 02/23/2013   K 3.9 02/23/2013   CL 107 02/23/2013   CO2 25 02/23/2013    No results found for this basename: HGBA1C   Lipid Panel  No results found for this basename: chol, trig, hdl, cholhdl, vldl, ldlcalc       Assessment and plan:   [name] was seen today for establish care.  Diagnoses and associated orders for this visit:  Essential hypertension - cloNIDine (CATAPRES) tablet 0.1 mg; Take 1 tablet (0.1 mg total) by mouth once. Started back on - propranolol (INDERAL) 40 MG tablet; Take 1 tablet (40 mg total) by mouth 2 (two) times daily. - cloNIDine (CATAPRES) 0.1 MG tablet; Take 1 tablet (0.1 mg total) by mouth 2 (two) times daily.  Abdominal mass Will order abdominal ultrasound once she obtains the hospital discount to evaluate mass. It is non tender and freely moveable, I feel this is not emergent  Hx of substance abuse Comments: cocaine use - escitalopram (LEXAPRO) 10 MG tablet; Take 1 tablet (10 mg total) by mouth daily. For anxiety Explained several times to patient and her  mother that I will not given benzo's Gave patient information to attend [facility] or [facility] [place]  Family history of diabetes mellitus (DM) - POCT glycosylated hemoglobin (Hb A1C)-normal - POCT glucose (manual entry)  Immunization refused Comments: influenza Strongly suggest patient receive vaccine with her history.    Return in about 2 weeks (around 05/10/2014) for Nurse Visit-BP check and 6 weeks depression.  The patient was given clear instructions  to go to ER or return to medical center if symptoms don't improve, worsen or new problems develop. The patient verbalized understanding.   Holland Commons, NP-C [facility] [phone] 04/30/2014, 9:32 PM

## 2014-04-26 NOTE — Patient Instructions (Addendum)
Smoking Cessation Quitting smoking is important to your health and has many advantages. However, it is not always easy to quit since nicotine is a very addictive drug. Oftentimes, people try 3 times or more before being able to quit. This document explains the best ways for you to prepare to quit smoking. Quitting takes hard work and a lot of effort, but you can do it. ADVANTAGES OF QUITTING SMOKING  You will live longer, feel better, and live better.  Your body will feel the impact of quitting smoking almost immediately.  Within 20 minutes, blood pressure decreases. Your pulse returns to its normal level.  After 8 hours, carbon monoxide levels in the blood return to normal. Your oxygen level increases.  After 24 hours, the chance of having a heart attack starts to decrease. Your breath, hair, and body stop smelling like smoke.  After 48 hours, damaged nerve endings begin to recover. Your sense of taste and smell improve.  After 72 hours, the body is virtually free of nicotine. Your bronchial tubes relax and breathing becomes easier.  After 2 to 12 weeks, lungs can hold more air. Exercise becomes easier and circulation improves.  The risk of having a heart attack, stroke, cancer, or lung disease is greatly reduced.  After 1 year, the risk of coronary heart disease is cut in half.  After 5 years, the risk of stroke falls to the same as a nonsmoker.  After 10 years, the risk of lung cancer is cut in half and the risk of other cancers decreases significantly.  After 15 years, the risk of coronary heart disease drops, usually to the level of a nonsmoker.  If you are pregnant, quitting smoking will improve your chances of having a healthy baby.  The people you live with, especially any children, will be healthier.  You will have extra money to spend on things other than cigarettes. QUESTIONS TO THINK ABOUT BEFORE ATTEMPTING TO QUIT You may want to talk about your answers with your  health care provider.  Why do you want to quit?  If you tried to quit in the past, what helped and what did not?  What will be the most difficult situations for you after you quit? How will you plan to handle them?  Who can help you through the tough times? Your family? Friends? A health care provider?  What pleasures do you get from smoking? What ways can you still get pleasure if you quit? Here are some questions to ask your health care provider:  How can you help me to be successful at quitting?  What medicine do you think would be best for me and how should I take it?  What should I do if I need more help?  What is smoking withdrawal like? How can I get information on withdrawal? GET READY  Set a quit date.  Change your environment by getting rid of all cigarettes, ashtrays, matches, and lighters in your home, car, or work. Do not let people smoke in your home.  Review your past attempts to quit. Think about what worked and what did not. GET SUPPORT AND ENCOURAGEMENT You have a better chance of being successful if you have help. You can get support in many ways.  Tell your family, friends, and coworkers that you are going to quit and need their support. Ask them not to smoke around you.  Get individual, group, or telephone counseling and support. Programs are available at local hospitals and health centers. Call   your local health department for information about programs in your area.  Spiritual beliefs and practices may help some smokers quit.  Download a "quit meter" on your computer to keep track of quit statistics, such as how long you have gone without smoking, cigarettes not smoked, and money saved.  Get a self-help book about quitting smoking and staying off tobacco. LEARN NEW SKILLS AND BEHAVIORS  Distract yourself from urges to smoke. Talk to someone, go for a walk, or occupy your time with a task.  Change your normal routine. Take a different route to work.  Drink tea instead of coffee. Eat breakfast in a different place.  Reduce your stress. Take a hot bath, exercise, or read a book.  Plan something enjoyable to do every day. Reward yourself for not smoking.  Explore interactive web-based programs that specialize in helping you quit. GET MEDICINE AND USE IT CORRECTLY Medicines can help you stop smoking and decrease the urge to smoke. Combining medicine with the above behavioral methods and support can greatly increase your chances of successfully quitting smoking.  Nicotine replacement therapy helps deliver nicotine to your body without the negative effects and risks of smoking. Nicotine replacement therapy includes nicotine gum, lozenges, inhalers, nasal sprays, and skin patches. Some may be available over-the-counter and others require a prescription.  Antidepressant medicine helps people abstain from smoking, but how this works is unknown. This medicine is available by prescription.  Nicotinic receptor partial agonist medicine simulates the effect of nicotine in your brain. This medicine is available by prescription. Ask your health care provider for advice about which medicines to use and how to use them based on your health history. Your health care provider will tell you what side effects to look out for if you choose to be on a medicine or therapy. Carefully read the information on the package. Do not use any other product containing nicotine while using a nicotine replacement product.  RELAPSE OR DIFFICULT SITUATIONS Most relapses occur within the first 3 months after quitting. Do not be discouraged if you start smoking again. Remember, most people try several times before finally quitting. You may have symptoms of withdrawal because your body is used to nicotine. You may crave cigarettes, be irritable, feel very hungry, cough often, get headaches, or have difficulty concentrating. The withdrawal symptoms are only temporary. They are strongest  when you first quit, but they will go away within 10-14 days. To reduce the chances of relapse, try to:  Avoid drinking alcohol. Drinking lowers your chances of successfully quitting.  Reduce the amount of caffeine you consume. Once you quit smoking, the amount of caffeine in your body increases and can give you symptoms, such as a rapid heartbeat, sweating, and anxiety.  Avoid smokers because they can make you want to smoke.  Do not let weight gain distract you. Many smokers will gain weight when they quit, usually less than 10 pounds. Eat a healthy diet and stay active. You can always lose the weight gained after you quit.  Find ways to improve your mood other than smoking. FOR MORE INFORMATION  www.smokefree.gov  Document Released: 08/04/2001 Document Revised: 12/25/2013 Document Reviewed: 11/19/2011 ExitCare Patient Information 2015 [employer]. This information is not intended to replace advice given to you by your health care provider. Make sure you discuss any questions you have with your health care provider. DASH Eating Plan DASH stands for "Dietary Approaches to Stop Hypertension." The DASH eating plan is a healthy eating plan that has   been shown to reduce high blood pressure (hypertension). Additional health benefits may include reducing the risk of type 2 diabetes mellitus, heart disease, and stroke. The DASH eating plan may also help with weight loss. WHAT DO I NEED TO KNOW ABOUT THE DASH EATING PLAN? For the DASH eating plan, you will follow these general guidelines:  Choose foods with a percent daily value for sodium of less than 5% (as listed on the food label).  Use salt-free seasonings or herbs instead of table salt or sea salt.  Check with your health care provider or pharmacist before using salt substitutes.  Eat lower-sodium products, often labeled as "lower sodium" or "no salt added."  Eat fresh foods.  Eat more vegetables, fruits, and low-fat dairy  products.  Choose whole grains. Look for the word "whole" as the first word in the ingredient list.  Choose fish and skinless chicken or turkey more often than red meat. Limit fish, poultry, and meat to 6 oz (170 g) each day.  Limit sweets, desserts, sugars, and sugary drinks.  Choose heart-healthy fats.  Limit cheese to 1 oz (28 g) per day.  Eat more home-cooked food and less restaurant, buffet, and fast food.  Limit fried foods.  Cook foods using methods other than frying.  Limit canned vegetables. If you do use them, rinse them well to decrease the sodium.  When eating at a restaurant, ask that your food be prepared with less salt, or no salt if possible. WHAT FOODS CAN I EAT? Seek help from a dietitian for individual calorie needs. Grains Whole grain or whole wheat bread. Brown rice. Whole grain or whole wheat pasta. Quinoa, bulgur, and whole grain cereals. Low-sodium cereals. Corn or whole wheat flour tortillas. Whole grain cornbread. Whole grain crackers. Low-sodium crackers. Vegetables Fresh or frozen vegetables (raw, steamed, roasted, or grilled). Low-sodium or reduced-sodium tomato and vegetable juices. Low-sodium or reduced-sodium tomato sauce and paste. Low-sodium or reduced-sodium canned vegetables.  Fruits All fresh, canned (in natural juice), or frozen fruits. Meat and Other Protein Products Ground beef (85% or leaner), grass-fed beef, or beef trimmed of fat. Skinless chicken or turkey. Ground chicken or turkey. Pork trimmed of fat. All fish and seafood. Eggs. Dried beans, peas, or lentils. Unsalted nuts and seeds. Unsalted canned beans. Dairy Low-fat dairy products, such as skim or 1% milk, 2% or reduced-fat cheeses, low-fat ricotta or cottage cheese, or plain low-fat yogurt. Low-sodium or reduced-sodium cheeses. Fats and Oils Tub margarines without trans fats. Light or reduced-fat mayonnaise and salad dressings (reduced sodium). Avocado. Safflower, olive, or canola  oils. Natural peanut or almond butter. Other Unsalted popcorn and pretzels. The items listed above may not be a complete list of recommended foods or beverages. Contact your dietitian for more options. WHAT FOODS ARE NOT RECOMMENDED? Grains White bread. White pasta. White rice. Refined cornbread. Bagels and croissants. Crackers that contain trans fat. Vegetables Creamed or fried vegetables. Vegetables in a cheese sauce. Regular canned vegetables. Regular canned tomato sauce and paste. Regular tomato and vegetable juices. Fruits Dried fruits. Canned fruit in light or heavy syrup. Fruit juice. Meat and Other Protein Products Fatty cuts of meat. Ribs, chicken wings, bacon, sausage, bologna, salami, chitterlings, fatback, hot dogs, bratwurst, and packaged luncheon meats. Salted nuts and seeds. Canned beans with salt. Dairy Whole or 2% milk, cream, half-and-half, and cream cheese. Whole-fat or sweetened yogurt. Full-fat cheeses or blue cheese. Nondairy creamers and whipped toppings. Processed cheese, cheese spreads, or cheese curds. Condiments Onion and garlic salt,   seasoned salt, table salt, and sea salt. Canned and packaged gravies. Worcestershire sauce. Tartar sauce. Barbecue sauce. Teriyaki sauce. Soy sauce, including reduced sodium. Steak sauce. Fish sauce. Oyster sauce. Cocktail sauce. Horseradish. Ketchup and mustard. Meat flavorings and tenderizers. Bouillon cubes. Hot sauce. Tabasco sauce. Marinades. Taco seasonings. Relishes. Fats and Oils Butter, stick margarine, lard, shortening, ghee, and bacon fat. Coconut, palm kernel, or palm oils. Regular salad dressings. Other Pickles and olives. Salted popcorn and pretzels. The items listed above may not be a complete list of foods and beverages to avoid. Contact your dietitian for more information. WHERE CAN I FIND MORE INFORMATION? National Heart, Lung, and [employer] Institute: www.nhlbi.nih.gov/health/health-topics/topics/dash/ Document Released:  07/30/2011 Document Revised: 12/25/2013 Document Reviewed: 06/14/2013 ExitCare Patient Information 2015 [employer]. This information is not intended to replace advice given to you by your health care provider. Make sure you discuss any questions you have with your health care provider.  

## 2014-04-26 NOTE — Progress Notes (Signed)
Pt is here to establish care. Pt has a history of HTN and anxiety. Pt has a knot on her abdomen that is painful if she pushes it. Pt is requesting to have her thyroid checked.

## 2014-04-30 ENCOUNTER — Encounter: Payer: Self-pay | Admitting: Internal Medicine

## 2014-05-10 ENCOUNTER — Ambulatory Visit: Payer: Self-pay | Attending: Internal Medicine | Admitting: *Deleted

## 2014-05-10 VITALS — BP 125/82 | HR 70 | Temp 98.2°F | Resp 18

## 2014-05-10 DIAGNOSIS — I10 Essential (primary) hypertension: Secondary | ICD-10-CM | POA: Insufficient documentation

## 2014-05-10 DIAGNOSIS — Z0000 Encounter for general adult medical examination without abnormal findings: Secondary | ICD-10-CM

## 2014-05-10 DIAGNOSIS — R739 Hyperglycemia, unspecified: Secondary | ICD-10-CM

## 2014-05-10 LAB — LIPID PANEL
Cholesterol: 188 mg/dL (ref 0–200)
HDL: 68 mg/dL (ref >39)
LDL Cholesterol: 87 mg/dL (ref 0–99)
TRIGLYCERIDES: 166 mg/dL — AB (ref <150)
Total CHOL/HDL Ratio: 2.8 Ratio
VLDL: 33 mg/dL (ref 0–40)

## 2014-05-10 LAB — COMPREHENSIVE METABOLIC PANEL
ALBUMIN: 4.2 g/dL (ref 3.5–5.2)
ALT: 31 U/L (ref 0–35)
AST: 24 U/L (ref 0–37)
Alkaline Phosphatase: 88 U/L (ref 39–117)
BUN: 12 mg/dL (ref 6–23)
CALCIUM: 9.4 mg/dL (ref 8.4–10.5)
CHLORIDE: 105 meq/L (ref 96–112)
CO2: 22 mEq/L (ref 19–32)
Creat: 0.88 mg/dL (ref 0.50–1.10)
GLUCOSE: 99 mg/dL (ref 70–99)
POTASSIUM: 4.9 meq/L (ref 3.5–5.3)
Sodium: 134 mEq/L — ABNORMAL LOW (ref 135–145)
Total Bilirubin: 0.7 mg/dL (ref 0.2–1.2)
Total Protein: 6.8 g/dL (ref 6.0–8.3)

## 2014-05-10 LAB — HEMOGLOBIN A1C
Hgb A1c MFr Bld: 5.7 % — ABNORMAL HIGH (ref <5.7)
Mean Plasma Glucose: 117 mg/dL — ABNORMAL HIGH (ref <117)

## 2014-05-10 NOTE — Progress Notes (Signed)
Patient presents for BP recheck  States she took clonidine this AM  but did not take propanol. Patient is fasting this AM for blood draw and always takes propanolol with food. Mother present.  BP 125/82 left arm  P 70 SPO2 98%  Patient states that she is fasting and was expecting to have blood drawn for cholesterol, thyroid and "sugar." Discussed with provider and lab orders placed.

## 2014-05-11 LAB — TSH: TSH: 4.401 u[IU]/mL (ref 0.350–4.500)

## 2014-05-17 ENCOUNTER — Telehealth: Payer: Self-pay | Admitting: Emergency Medicine

## 2014-05-17 NOTE — Telephone Encounter (Signed)
Message copied by Darlis Loan on Thu May 17, 2014  5:35 PM ------      Message from: Jeanann Lewandowsky E      Created: Mon May 14, 2014  6:03 PM       Please inform patient that her laboratory tests results are mostly within normal limits. Her hemoglobin A1c is 5.7 which put her at increased risk of developing diabetes mellitus in the future. We advised low-carbohydrate, low sugar diet as well as regular physical exercise. ------

## 2014-05-17 NOTE — Telephone Encounter (Signed)
Left message for pt to call for lab results 

## 2014-05-18 ENCOUNTER — Telehealth: Payer: Self-pay | Admitting: Internal Medicine

## 2014-05-18 NOTE — Telephone Encounter (Signed)
Pt returning nurse call in regards to lab results. Please follow up.

## 2014-05-21 ENCOUNTER — Telehealth: Payer: Self-pay | Admitting: Emergency Medicine

## 2014-05-21 NOTE — Telephone Encounter (Signed)
Left message for pt to call for results  

## 2014-05-22 ENCOUNTER — Telehealth: Payer: Self-pay | Admitting: Emergency Medicine

## 2014-05-22 NOTE — Telephone Encounter (Signed)
Pt given test results with instructions to start low carbohydrate diet with exercise to prevent DM

## 2014-06-04 ENCOUNTER — Ambulatory Visit: Payer: Self-pay | Admitting: Internal Medicine

## 2014-10-04 ENCOUNTER — Encounter (HOSPITAL_COMMUNITY): Payer: Self-pay | Admitting: Emergency Medicine

## 2014-10-04 ENCOUNTER — Emergency Department (HOSPITAL_COMMUNITY)
Admission: EM | Admit: 2014-10-04 | Discharge: 2014-10-05 | Disposition: A | Discharge status: Home / Self Care | Payer: Self-pay | Attending: Emergency Medicine | Admitting: Emergency Medicine

## 2014-10-04 DIAGNOSIS — R062 Wheezing: Secondary | ICD-10-CM | POA: Insufficient documentation

## 2014-10-04 DIAGNOSIS — R600 Localized edema: Secondary | ICD-10-CM | POA: Insufficient documentation

## 2014-10-04 DIAGNOSIS — R42 Dizziness and giddiness: Secondary | ICD-10-CM | POA: Insufficient documentation

## 2014-10-04 DIAGNOSIS — I10 Essential (primary) hypertension: Secondary | ICD-10-CM | POA: Insufficient documentation

## 2014-10-04 DIAGNOSIS — T7840XA Allergy, unspecified, initial encounter: Secondary | ICD-10-CM | POA: Insufficient documentation

## 2014-10-04 DIAGNOSIS — R000 Tachycardia, unspecified: Secondary | ICD-10-CM | POA: Insufficient documentation

## 2014-10-04 DIAGNOSIS — Z880 Allergy status to penicillin: Secondary | ICD-10-CM | POA: Insufficient documentation

## 2014-10-04 DIAGNOSIS — R0602 Shortness of breath: Secondary | ICD-10-CM | POA: Insufficient documentation

## 2014-10-04 DIAGNOSIS — R110 Nausea: Secondary | ICD-10-CM | POA: Insufficient documentation

## 2014-10-04 DIAGNOSIS — Z8659 Personal history of other mental and behavioral disorders: Secondary | ICD-10-CM | POA: Insufficient documentation

## 2014-10-04 DIAGNOSIS — Z720 Tobacco use: Secondary | ICD-10-CM | POA: Insufficient documentation

## 2014-10-04 DIAGNOSIS — L509 Urticaria, unspecified: Secondary | ICD-10-CM | POA: Insufficient documentation

## 2014-10-04 LAB — BASIC METABOLIC PANEL
ANION GAP: 14 (ref 5–15)
BUN: 7 mg/dL (ref 6–23)
CALCIUM: 8.9 mg/dL (ref 8.4–10.5)
CHLORIDE: 104 mmol/L (ref 96–112)
CO2: 19 mmol/L (ref 19–32)
CREATININE: 1.04 mg/dL (ref 0.50–1.10)
GFR calc non Af Amer: 71 mL/min — ABNORMAL LOW (ref >90)
GFR, EST AFRICAN AMERICAN: 83 mL/min — AB (ref >90)
Glucose, Bld: 158 mg/dL — ABNORMAL HIGH (ref 70–99)
Potassium: 3 mmol/L — ABNORMAL LOW (ref 3.5–5.1)
Sodium: 137 mmol/L (ref 135–145)

## 2014-10-04 LAB — CBC
HCT: 44 % (ref 36.0–46.0)
Hemoglobin: 15.4 g/dL — ABNORMAL HIGH (ref 12.0–15.0)
MCH: 31.8 pg (ref 26.0–34.0)
MCHC: 35 g/dL (ref 30.0–36.0)
MCV: 90.7 fL (ref 78.0–100.0)
PLATELETS: 302 10*3/uL (ref 150–400)
RBC: 4.85 MIL/uL (ref 3.87–5.11)
RDW: 12.9 % (ref 11.5–15.5)
WBC: 12.7 10*3/uL — ABNORMAL HIGH (ref 4.0–10.5)

## 2014-10-04 MED ORDER — EPINEPHRINE 0.3 MG/0.3ML IJ SOAJ
0.3000 mg | Freq: Once | INTRAMUSCULAR | Status: AC
  Administered 2014-10-04: 0.3 mg via INTRAMUSCULAR

## 2014-10-04 MED ORDER — SODIUM CHLORIDE 0.9 % IV BOLUS (SEPSIS)
1000.0000 mL | Freq: Once | INTRAVENOUS | Status: AC
  Administered 2014-10-04: 500 mL via INTRAVENOUS

## 2014-10-04 MED ORDER — ONDANSETRON HCL 4 MG/2ML IJ SOLN
INTRAMUSCULAR | Status: AC
  Administered 2014-10-04: 4 mg
  Filled 2014-10-04: qty 2

## 2014-10-04 MED ORDER — METHYLPREDNISOLONE SODIUM SUCC 125 MG IJ SOLR
125.0000 mg | Freq: Once | INTRAMUSCULAR | Status: AC
  Administered 2014-10-04: 125 mg via INTRAVENOUS

## 2014-10-04 MED ORDER — DIPHENHYDRAMINE HCL 50 MG/ML IJ SOLN
50.0000 mg | Freq: Once | INTRAMUSCULAR | Status: AC
  Administered 2014-10-04: 50 mg via INTRAVENOUS

## 2014-10-04 MED ORDER — FAMOTIDINE IN NACL 20-0.9 MG/50ML-% IV SOLN
20.0000 mg | Freq: Once | INTRAVENOUS | Status: AC
  Administered 2014-10-04: 20 mg via INTRAVENOUS

## 2014-10-04 NOTE — ED Provider Notes (Signed)
CSN: [id number]     Arrival date & time 10/04/14  2046 History   First MD Initiated Contact with Patient 10/04/14 2053     Chief Complaint  Patient presents with  . Allergic Reaction  . Urticaria     (Consider location/radiation/quality/duration/timing/severity/associated sxs/prior Treatment) Patient is a 29 y.o. female presenting with allergic reaction. The history is provided by the patient.  Allergic Reaction Presenting symptoms: rash, swelling and wheezing   Presenting symptoms: no difficulty swallowing   Rash:    Location:  Full body   Quality: itchiness and redness     Severity:  Severe   Onset quality:  Sudden   Duration:  1 hour   Timing:  Constant   Progression:  Worsening Severity:  Severe Prior allergic episodes:  No prior episodes Context comment:  Ate a peanut butter sandwich and also took two generic-brand motrin Relieved by:  Nothing Worsened by:  Nothing tried Ineffective treatments:  None tried   Past Medical History  Diagnosis Date  . Hypertension   . Anxiety    Past Surgical History  Procedure Laterality Date  . Artery repair    . Cesarean section      x 5  . Tubal ligation    . Mouth surgery    . Tear duct probing     Family History  Problem Relation Age of Onset  . CAD Other   . Diabetes Other   . Hypertension Other   . Cancer Other   . Thyroid disease Other    History  Substance Use Topics  . Smoking status: Current Every Day Smoker    Types: Cigarettes  . Smokeless tobacco: Not on file  . Alcohol Use: Yes     Comment: occ   OB History    No data available     Review of Systems  Constitutional: Negative for fever and chills.  HENT: Positive for facial swelling. Negative for congestion, sore throat, trouble swallowing and voice change.   Eyes: Negative for visual disturbance.  Respiratory: Positive for shortness of breath and wheezing. Negative for cough.   Cardiovascular: Negative for chest pain and leg swelling.   Gastrointestinal: Positive for nausea. Negative for vomiting, abdominal pain and diarrhea.  Genitourinary: Negative for dysuria and flank pain.  Musculoskeletal: Negative for neck pain and neck stiffness.  Skin: Positive for rash.  Neurological: Positive for light-headedness. Negative for dizziness, weakness and headaches.      Allergies  Acetaminophen; Codeine; Lisinopril; and Penicillins  Home Medications   Prior to Admission medications   Medication Sig Start Date End Date Taking? Authorizing Provider  ibuprofen (ADVIL,MOTRIN) 200 MG tablet Take 800 mg by mouth every 6 (six) hours as needed for moderate pain.   Yes Historical Provider, MD  ALPRAZolam Prudy Feeler) 0.5 MG tablet Take 1 tablet (0.5 mg total) by mouth 2 (two) times daily as needed for sleep. Patient not taking: Reported on 10/04/2014 07/30/13   Emilia Beck, PA-C  ALPRAZolam Prudy Feeler) 0.5 MG tablet Take 1 tablet (0.5 mg total) by mouth 3 (three) times daily as needed for anxiety. Patient not taking: Reported on 10/04/2014 02/15/14   Melvenia Beam A Upstill, PA-C  clindamycin (CLEOCIN) 150 MG capsule Take 2 capsules (300 mg total) by mouth 3 (three) times daily. Patient not taking: Reported on 10/04/2014 02/15/14   Melvenia Beam A Upstill, PA-C  cloNIDine (CATAPRES) 0.1 MG tablet Take 1 tablet (0.1 mg total) by mouth 2 (two) times daily. Patient not taking: Reported on 10/04/2014 04/26/14  Ambrose Finland, NP  escitalopram (LEXAPRO) 10 MG tablet Take 1 tablet (10 mg total) by mouth daily. Patient not taking: Reported on 10/04/2014 04/26/14   Ambrose Finland, NP  HYDROcodone-acetaminophen (NORCO/VICODIN) 5-325 MG per tablet Take 1-2 tablets by mouth every 4 (four) hours as needed for moderate pain. Patient not taking: Reported on 10/04/2014 02/15/14   Melvenia Beam A Upstill, PA-C  propranolol (INDERAL) 40 MG tablet Take 1 tablet (40 mg total) by mouth 2 (two) times daily. Patient not taking: Reported on 10/04/2014 04/26/14   Ambrose Finland, NP   BP 153/99 mmHg   Pulse 89  Temp(Src) 98.2 F (36.8 C) (Oral)  Resp 18  SpO2 99%  LMP 09/27/2014 Physical Exam  Constitutional: She is oriented to person, place, and time. She appears well-developed and well-nourished. No distress.  HENT:  Head: Normocephalic and atraumatic.  Mouth/Throat: No oropharyngeal exudate.  Mild lip swelling. No uvular swelling or edema. No posterior pharyngeal swelling. OP widely patent.  Eyes: Pupils are equal, round, and reactive to light.  Neck: Normal range of motion. Neck supple.  Cardiovascular: Normal heart sounds.  Tachycardia present.  Exam reveals no friction rub.   No murmur heard. Pulmonary/Chest: Effort normal. No respiratory distress. She has wheezes. She has no rales.  Abdominal: Soft. She exhibits no distension. There is no tenderness.  Neurological: She is alert and oriented to person, place, and time.  Skin: Skin is warm. Rash (diffuse, urticarial-type rash noted on trunk, UE and LE bilaterally) noted.    ED Course  Procedures (including critical care time) Labs Review Labs Reviewed  CBC - Abnormal; Notable for the following:    WBC 12.7 (*)    Hemoglobin 15.4 (*)    All other components within normal limits  BASIC METABOLIC PANEL    Imaging Review No results found.   EKG Interpretation   Date/Time:  Thursday October 04 2014 20:51:01 EST Ventricular Rate:  125 PR Interval:  150 QRS Duration: 81 QT Interval:  314 QTC Calculation: 453 R Axis:   60 Text Interpretation:  Sinus tachycardia RSR' in V1 or V2, probably normal  variant SINCE LAST TRACING HEART RATE HAS INCREASED Confirmed by Mirian Mo [phone] on 10/04/2014 10:46:01 PM      MDM   Final diagnoses:  Allergic reaction, initial encounter    Ms. Traverse is a 29 yo F with PMHx of anxiety and HTN who presents with acute onset urticaria, SOB, and lip swelling with mild nausea. See HPI above. On arrival, T 98.4F, HR 128, RR 18, BP 130/81, satting 99% on RA. Exam as above, pt  in moderate distress, with diffuse urticaria, mild wheezing, lip but no uvular swelling.  Pt's presentation c/w acute anaphylaxis with multisystem involvement. No hypotension or signs of anaphylactic shock. EpiPen given on arrival, will start IVF with solumedrol, benadryl, and prednisone. Trigger is unclear - pt did have generic-brand ibuprofen for the first time, with possible new ingestion, as well as peanut butter sandwich though pt has had bread and PB without difficulty. No insect bites. No SOB or signs of airway compromise. Will f/u after treatment.  Urticaria resolved after Epi and IV treatment. VSS, HR now 80s. Will monitor for 4 hr post-Epi for recurrence. Pt c/o mild lip swelling but no objective swelling or anaphylaxis noted on my exam. Will monitor.  Sx completely resolved 4 hr after Epi. Rash, lip swelling resolved. HR 80s, BP mildly elevated but this is chronic, pt is off clonidine, and received  epi. No HA, vision changes, or hypertensive urgency sx. Satting well on RA with clear BS bilaterally. Will d/c with prednisone, benadryl, EpiPen, and refill home antihypertensives. Pt will f/u with PCP for allergen testing. VSS.  Clinical Impression: 1. Allergic reaction, initial encounter   2. Essential hypertension     Disposition: Discharge  Condition: Good  I have discussed the results, Dx and Tx plan with the pt(& family if present). He/she/they expressed understanding and agree(s) with the plan. Discharge instructions discussed at great length. Strict return precautions discussed and pt &/or family have verbalized understanding of the instructions. No further questions at time of discharge.    New Prescriptions   DIPHENHYDRAMINE (BENADRYL) 25 MG CAPSULE    Take 1 capsule (25 mg total) by mouth every 6 (six) hours as needed for itching.   EPINEPHRINE 0.3 MG/0.3 ML IJ SOAJ INJECTION    Inject 0.3 mLs (0.3 mg total) into the muscle once.   PREDNISONE (DELTASONE) 20 MG TABLET    Take 3  tablets (60 mg total) by mouth daily.    Follow Up: [facility]     [address] [place] [address] [phone]  Follow-up this week for ED follow-up and allergen testing   Pt seen in conjunction with Dr. Mirian Mo, MD      Shaune Pollack, MD 10/05/14 [id number]  Mirian Mo, MD 10/08/14 [phone]

## 2014-10-04 NOTE — ED Notes (Signed)
Gave pt Sprite, per RN

## 2014-10-04 NOTE — ED Notes (Signed)
Pt reports eating Peanut butter at lunch time for first time "in a while" today, [employer] brand ibuprofen for first time, reports hives and swelling beginning around 1945.  Pt reports anxiety and difficulty breathing.  Facial edema,hives visible.

## 2014-10-04 NOTE — ED Notes (Signed)
Angioedema assessed, no change noted at this time.

## 2014-10-05 MED ORDER — PROPRANOLOL HCL 40 MG PO TABS: 40.0000 mg | ORAL_TABLET | Freq: Two times a day (BID) | ORAL | Status: DC

## 2014-10-05 MED ORDER — EPINEPHRINE 0.3 MG/0.3ML IJ SOAJ
0.3000 mg | Freq: Once | INTRAMUSCULAR | Status: DC
Start: 2014-10-05 — End: 2015-06-17

## 2014-10-05 MED ORDER — DIPHENHYDRAMINE HCL 25 MG PO CAPS: 25.0000 mg | ORAL_CAPSULE | Freq: Four times a day (QID) | ORAL | Status: DC | PRN

## 2014-10-05 MED ORDER — PREDNISONE 20 MG PO TABS: 60.0000 mg | ORAL_TABLET | Freq: Every day | ORAL | Status: AC

## 2014-10-05 MED ORDER — CLONIDINE HCL 0.1 MG PO TABS
0.1000 mg | ORAL_TABLET | Freq: Once | ORAL | Status: AC
  Administered 2014-10-05: 0.1 mg via ORAL
  Filled 2014-10-05: qty 1

## 2014-10-05 MED ORDER — CLONIDINE HCL 0.1 MG PO TABS: 0.1000 mg | ORAL_TABLET | Freq: Two times a day (BID) | ORAL | Status: DC

## 2014-10-05 NOTE — Discharge Instructions (Signed)
°Emergency Department Resource Guide °1) Find a Doctor and Pay Out of Pocket °Although you won't have to find out who is covered by your insurance plan, it is a good idea to ask around and get recommendations. You will then need to call the office and see if the doctor you have chosen will accept you as a new patient and what types of options they offer for patients who are self-pay. Some doctors offer discounts or will set up payment plans for their patients who do not have insurance, but you will need to ask so you aren't surprised when you get to your appointment. ° °2) Contact Your Local Health Department °Not all health departments have doctors that can see patients for sick visits, but many do, so it is worth a call to see if yours does. If you don't know where your local health department is, you can check in your phone book. The CDC also has a tool to help you locate your state's health department, and many state websites also have listings of all of their local health departments. ° °3) Find a Walk-in Clinic °If your illness is not likely to be very severe or complicated, you may want to try a walk in clinic. These are popping up all over the country in pharmacies, drugstores, and shopping centers. They're usually staffed by nurse practitioners or physician assistants that have been trained to treat common illnesses and complaints. They're usually fairly quick and inexpensive. However, if you have serious medical issues or chronic medical problems, these are probably not your best option. ° °No Primary Care Doctor: °- Call [facility] at  832-8000 - they can help you locate a primary care doctor that  accepts your insurance, provides certain services, etc. °- Physician Referral Service- 1-800-533-3463 ° °Chronic Pain Problems: °Organization         Address  Phone   Notes  °[facility]  (336) 297-2271 Patients need to be referred by their primary care doctor.  ° °Medication  Assistance: °Organization         Address  Phone   Notes  °[place] [facility] [address] °[place], [place] [address] (336) 641-8030 --Must be a resident of [place] °-- Must have NO insurance coverage whatsoever (no Medicaid/ Medicare, etc.) °-- The pt. MUST have a primary care doctor that directs their care regularly and follows them in the community °  °[facility]  (866) 331-1348   °[employer]  (888) 892-1162   ° °Agencies that provide inexpensive medical care: °Organization         Address  Phone   Notes  °[facility]  (336) 832-8035   °[facility]    (336) 832-7272   °[facility] [address] °[place], [place] [address] (336) 832-4777   °[facility] [address], °[place] (336) 271-4999   °[facility]    (336) 373-0678   °[facility]    (336) 272-1050   °[facility] ° [address], [place] Phone:  (336) 832-4444, Fax:  (336) 832-4440 Hours of Operation:  9 am - 6 pm, M-F.  Also accepts Medicaid/Medicare and self-pay.  °[facility] ° [address], [place] Phone: (336) 832-3150, Fax: (336) 832-3151. Hours of Operation:  8:30 am - 5:30 pm, M-F.  Also accepts Medicaid and self-pay.  °[facility] [place] [address], [place] Phone: (336) 878-6027   °[facility] [address], [place], [place] (336)723-1848, Ext. 123 Mondays & Thursdays: 7-9 AM.  First 15 patients are seen on a first come, first serve basis. °  ° °Medicaid-accepting [place] Providers: ° °Organization         Address  Phone   Notes  °[facility] [address], [place] (336) 641-2100 Also accepts self-pay patients.  °[facility] [address], [place] ° (336) 856-9996   °[facility] [address], [place]  (336) 288-8857   °[facility] [address] [place] [address], [place] (336) 299-7000   °Veita Bland [address], [place]  ° (336) 373-1557 Only accepts [place] Access Medicaid patients after they have their name applied to their card.  ° °Self-Pay (no insurance) in [place]: ° °Organization         Address  Phone   Notes  °Sickle Cell Patients, [facility] [address], [place] (336) 832-1970   °[facility] [address], [place] (336) 832-4400   °[facility] ° [address], [place] (336) 992-4800   °[facility]/Dr. Osei-Bonsu ° [address] [place] [address], [place] or [address], [place] (336) 841-8500 Phone number for both [place] and [place] locations is the same.  °[facility] [address], [place] (336) 299-0000   °[facility] [address] [place] [address], [place] or [address] (336) 852-7530 °(336) 878-2260   °[facility] [address], [place] (336) 350-1642, phone; (336) 294-5005, fax Sees patients 1st and 3rd Saturday of every month.  Must not qualify for public or private insurance (i.e. Medicaid, Medicare, [place] Health Choice, Veterans' Benefits) • Household income should be no more than 200% of the poverty level •The clinic cannot treat you if you are pregnant or think you are pregnant • Sexually transmitted diseases are not treated at the clinic.  ° ° °Dental Care: °Organization         Address  Phone  Notes  °[place] [facility] [address], [place] (336) 641-6152 Accepts children up to age 21 who are enrolled in Medicaid or [place] Health Choice; pregnant women with a Medicaid card; and children who have applied for Medicaid or [place] Health Choice, but were declined, whose parents can pay a reduced fee at time of service.  °[place]  [facility] [place]  [address], [place] (336) 641-7733 Accepts children up to age 21 who are enrolled in Medicaid or [place] Health Choice; pregnant women with a Medicaid card; and children who have applied for Medicaid or [place] Health Choice, but were declined, whose parents can pay a reduced fee at time of service.  °[facility] ° [address], [place] (336) 641-4533 Patients are seen by appointment only. Walk-ins are not accepted. [facility] will see patients 18 years of age and older. °Monday - Tuesday (8am-5pm) °Most Wednesdays (8:30-5pm) °$30 per visit, cash only  °[facility] ° [address], [place] (336) 641-4533 Patients are seen by appointment only. Walk-ins are not accepted. [facility] will see patients 18 years of age and older. °One   Wednesday Evening (Monthly: Volunteer Based).  $30 per visit, cash only  °[facility]  (919) 537-3737 for adults; Children under age 4, call Graduate Pediatric Dentistry at (919) [phone]. Children aged 4-14, please call (919) [phone] to request a pediatric application. ° Dental services are provided in all areas of dental care including fillings, crowns and bridges, complete and partial dentures, implants, gum treatment, root canals, and extractions. Preventive care is also provided. Treatment is provided to both adults and children. °Patients are selected via a lottery and there is often a waiting list. °  °[facility] [address], °[place] ° (336) 763-8833 [email or web address] °  °[facility] [address], [place], [place] (336)723-1848, Ext. 123 Second and Fourth Thursday of each month, opens at 6:30 AM; Clinic ends at 9 AM.  Patients are seen on a first-come first-served basis, and a limited number are seen during each clinic.  ° °[facility] ° [address], [place], [place] (336) 723-7904    Eligibility Requirements °You must have lived in [place], [place], or [place] counties for at least the last three months. °  You cannot be eligible for state or federal sponsored healthcare insurance, including Veterans Administration, Medicaid, or Medicare. °  You generally cannot be eligible for healthcare insurance through your employer.  °  How to apply: °Eligibility screenings are held every Tuesday and Wednesday afternoon from 1:00 pm until 4:00 pm. You do not need an appointment for the interview!  °[facility] [address], [place], [place] 336-631-2330   °[facility]  336-342-8273   °[facility]  336-703-3100   °[facility]  336-570-6415   ° °Behavioral Health Resources in the Community: °Intensive Outpatient Programs °Organization         Address  Phone  Notes  °[facility] [address], [place], [place] 336-878-6098   °[facility] [address], [place], [place] 336-832-9800   °[facility] [address], [place], [place] ° 336-882-2125   °[facility] [address],  °[place], [place] 1-800-853-5163 or 336-641-4981   °Substance Abuse Resources °Organization         Address  Phone  Notes  °[facility]  336-882-2125   °[facility]  336-784-9470   °[facility]  336-285-9073   °[facility]  336-845-3988   °Residential & Outpatient Substance Abuse Program  1-800-659-3381   °Psychological Services °Organization         Address  Phone  Notes  °[facility]  336- 832-9600   °[facility]  336- 378-7881   °[facility] [address], [place] 1-800-853-5163 or 336-641-4981   ° °Mobile Crisis Teams °Organization         Address  Phone  Notes  °[facility], Mobile Crisis Care Unit  1-877-626-1772   °Assertive °[facility] ° [address]  [place], [place] 336-834-9664   °Sharon DeEsch [address] °[place] 336-554-5454   ° °Self-Help/Support Groups °Organization         Address  Phone             Notes  °[facility] [place] - variety of support groups  336- 373-1402 Call for more information  °Narcotics Anonymous (NA), [facility] [address], °[place]  2 meetings at this location  ° °  Residential Treatment Programs °Organization         Address  Phone  Notes  °[facility] [address],    °[place]  1-866-801-8205   °[facility] ° [address], [place], [place] 704-293-8524   °[facility] [address], [place] 336-845-3988 Admissions: 8am-3pm M-F  °[facility] [address]    °[place], [place] 336-841-1104   °[facility] [address], [place], [place] 336-379-7146   °[facility] [address]  °[place], [place] 336-285-9073   °[facility] Intensive Outpatient [address], [place], [place] 336-852-3033   °[facility] [address]  °[place], [place] 1-877-615-2722 or 336-784-9470   °[facility] [address] [place], [place] 336-227-7417 Accepts Medicaid  °[facility] [address]  °[place] 1-800-659-3381 Substance Abuse/Addiction Treatment  ° °[place] Behavioral Health Resources °Organization         Address  Phone  Notes  °[facility]  (888) 581-9988   °Julie Brannon, PhD [address] [place], [place]   (336) 349-5553 or (336) 951-0000   °[facility]   [address] °[place], [place] (336) 349-4454   °[facility] [address], [place], [place] (336) 342-8316 Insurance/Medicaid/sponsorship through [facility]  °[facility] [address]                                    [place], [place] (336) 342-8316 Therapy/tele-psych/case    °[facility] [address]  ° [place], [place] (336) 349-2233    °Dr. Arfeen  (336) 349-4544   °[facility] [place]  [employer] [place] [facility] 1) [address], [place] °2) [address], [place] °3)  [address], [place] (336) 349-3220 °(336) 342-7768 ° °(336) 342-8140   °[place] [facility] (336) 342-1394 or (336) 342-3537 (After Hours)    ° ° °

## 2015-04-01 ENCOUNTER — Ambulatory Visit: Payer: Self-pay | Admitting: Internal Medicine

## 2015-04-08 ENCOUNTER — Ambulatory Visit: Payer: Self-pay | Admitting: Internal Medicine

## 2015-04-19 ENCOUNTER — Emergency Department (HOSPITAL_COMMUNITY)
Admission: EM | Admit: 2015-04-19 | Discharge: 2015-04-19 | Disposition: A | Discharge status: Home / Self Care | Payer: Self-pay | Attending: Emergency Medicine | Admitting: Emergency Medicine

## 2015-04-19 DIAGNOSIS — R002 Palpitations: Secondary | ICD-10-CM | POA: Insufficient documentation

## 2015-04-19 DIAGNOSIS — Z720 Tobacco use: Secondary | ICD-10-CM | POA: Insufficient documentation

## 2015-04-19 DIAGNOSIS — R0602 Shortness of breath: Secondary | ICD-10-CM | POA: Insufficient documentation

## 2015-04-19 DIAGNOSIS — I10 Essential (primary) hypertension: Secondary | ICD-10-CM | POA: Insufficient documentation

## 2015-04-19 DIAGNOSIS — Z880 Allergy status to penicillin: Secondary | ICD-10-CM | POA: Insufficient documentation

## 2015-04-19 DIAGNOSIS — F419 Anxiety disorder, unspecified: Secondary | ICD-10-CM | POA: Insufficient documentation

## 2015-04-19 DIAGNOSIS — R61 Generalized hyperhidrosis: Secondary | ICD-10-CM | POA: Insufficient documentation

## 2015-04-19 DIAGNOSIS — Z79899 Other long term (current) drug therapy: Secondary | ICD-10-CM | POA: Insufficient documentation

## 2015-04-19 MED ORDER — PROPRANOLOL HCL 40 MG PO TABS: 40.0000 mg | ORAL_TABLET | Freq: Two times a day (BID) | ORAL | Status: DC

## 2015-04-19 MED ORDER — CLONIDINE HCL 0.1 MG PO TABS: 0.1000 mg | ORAL_TABLET | Freq: Two times a day (BID) | ORAL | Status: DC

## 2015-04-19 MED ORDER — LORAZEPAM 0.5 MG PO TABS
1.0000 mg | ORAL_TABLET | Freq: Once | ORAL | Status: AC
  Administered 2015-04-19: 1 mg via ORAL
  Filled 2015-04-19: qty 2

## 2015-04-19 MED ORDER — ALPRAZOLAM 0.5 MG PO TABS: 0.5000 mg | ORAL_TABLET | Freq: Three times a day (TID) | ORAL | Status: DC | PRN
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
## 2015-04-19 NOTE — Discharge Instructions (Signed)
Return here as needed.  Follow-up with your doctor as soon as possible. 

## 2015-06-03 ENCOUNTER — Telehealth: Payer: Self-pay | Admitting: General Practice

## 2015-06-03 DIAGNOSIS — I10 Essential (primary) hypertension: Secondary | ICD-10-CM

## 2015-06-03 NOTE — Telephone Encounter (Signed)
Patient called to request a med refill for her blood pressure medication. Please f/u Patient Updated a new phone number Patient scheduled an appointment 10/17

## 2015-06-03 NOTE — Telephone Encounter (Signed)
-----   Message from Valerie A Keck, NP sent at 06/03/2015  1:55 PM EDT ----- May give 2 weeks worth of medication. I have not seen patient in over one year ----- Message -----    From: Milicent Acheampong A Adreyan Carbajal, RN    Sent: 06/03/2015  12:22 PM      To: Valerie A Keck, NP  Patient last seen 04/26/14. Patient here requesting refill for clonidine and propranolol. She has appt to see you on 06/10/15. Can I fill medications? Looks like they were prescribed at last ED visit on 04/19/15.  

## 2015-06-03 NOTE — Telephone Encounter (Signed)
Patient called to request a med refill for her blood pressure medication. Please f/u

## 2015-06-03 NOTE — Telephone Encounter (Signed)
Nurse called patient, at [phone], reached voicemail. Left message for patient to call Kielyn Kardell with [facility], at [phone]. Nurse attempted to call 2 other telephone numbers in chart.  [phone], person answering telephone reports patient ahs not had this telephone number in over a year. [phone], reached recoridng explaining "your call cannot be completed as dialed".

## 2015-06-04 ENCOUNTER — Other Ambulatory Visit: Payer: Self-pay

## 2015-06-04 DIAGNOSIS — I10 Essential (primary) hypertension: Secondary | ICD-10-CM

## 2015-06-04 MED ORDER — CLONIDINE HCL 0.1 MG PO TABS: 0.1000 mg | ORAL_TABLET | Freq: Two times a day (BID) | ORAL | Status: DC

## 2015-06-04 MED ORDER — PROPRANOLOL HCL 40 MG PO TABS
40.0000 mg | ORAL_TABLET | Freq: Two times a day (BID) | ORAL | Status: DC
Start: 2015-06-04 — End: 2015-06-04

## 2015-06-04 MED ORDER — PROPRANOLOL HCL 40 MG PO TABS: 40.0000 mg | ORAL_TABLET | Freq: Two times a day (BID) | ORAL | Status: DC

## 2015-06-04 NOTE — Telephone Encounter (Signed)
-----   Message from Ambrose Finland, NP sent at 06/03/2015  1:55 PM EDT ----- May give 2 weeks worth of medication. I have not seen patient in over one year ----- Message -----    From: Tandy Gaw, RN    Sent: 06/03/2015  12:22 PM      To: Ambrose Finland, NP  Patient last seen 04/26/14. Patient here requesting refill for clonidine and propranolol. She has appt to see you on 06/10/15. Can I fill medications? Looks like they were prescribed at last ED visit on 04/19/15.

## 2015-06-04 NOTE — Telephone Encounter (Signed)
Per pharmacy, prescriptions need to be sent with the entire 2 week amount for porpranolol and clonidine. Nurse sent prescriptions electronically, as requested.

## 2015-06-04 NOTE — Telephone Encounter (Signed)
Nurse called patient, patient verified date of birth. Patient aware of 2 weeks of clonidine and propranolol being sent to [facility] pharmacy. Patient voices understanding and has no further questions at this time. Nurse transferred patient to pharmacy to questions prices.

## 2015-06-04 NOTE — Telephone Encounter (Signed)
Nurse printed prescription for 7 days. Signed by provider. Pharmacist clarified with nurse. Patient should get 2 weeks of medications.  Nurse sent 1 week of clonidine and propranolol to pharmacy electronically.

## 2015-06-10 ENCOUNTER — Ambulatory Visit: Payer: Self-pay | Admitting: Internal Medicine

## 2015-06-17 ENCOUNTER — Encounter: Payer: Self-pay | Admitting: Internal Medicine

## 2015-06-17 ENCOUNTER — Ambulatory Visit: Payer: Self-pay | Attending: Internal Medicine | Admitting: Internal Medicine

## 2015-06-17 VITALS — BP 126/92 | HR 85 | Temp 98.0°F | Resp 16 | Ht 60.0 in | Wt 145.2 lb

## 2015-06-17 DIAGNOSIS — J069 Acute upper respiratory infection, unspecified: Secondary | ICD-10-CM | POA: Insufficient documentation

## 2015-06-17 DIAGNOSIS — F17210 Nicotine dependence, cigarettes, uncomplicated: Secondary | ICD-10-CM | POA: Insufficient documentation

## 2015-06-17 DIAGNOSIS — I10 Essential (primary) hypertension: Secondary | ICD-10-CM | POA: Insufficient documentation

## 2015-06-17 DIAGNOSIS — F17200 Nicotine dependence, unspecified, uncomplicated: Secondary | ICD-10-CM

## 2015-06-17 DIAGNOSIS — Z0000 Encounter for general adult medical examination without abnormal findings: Secondary | ICD-10-CM

## 2015-06-17 DIAGNOSIS — Z23 Encounter for immunization: Secondary | ICD-10-CM | POA: Insufficient documentation

## 2015-06-17 DIAGNOSIS — F419 Anxiety disorder, unspecified: Secondary | ICD-10-CM | POA: Insufficient documentation

## 2015-06-17 DIAGNOSIS — F1010 Alcohol abuse, uncomplicated: Secondary | ICD-10-CM | POA: Insufficient documentation

## 2015-06-17 LAB — COMPLETE METABOLIC PANEL WITH GFR
ALT: 112 U/L — ABNORMAL HIGH (ref 6–29)
AST: 125 U/L — ABNORMAL HIGH (ref 10–30)
Albumin: 4.1 g/dL (ref 3.6–5.1)
Alkaline Phosphatase: 106 U/L (ref 33–115)
BUN: 9 mg/dL (ref 7–25)
CALCIUM: 10.2 mg/dL (ref 8.6–10.2)
CO2: 23 mmol/L (ref 20–31)
Chloride: 102 mmol/L (ref 98–110)
Creat: 0.85 mg/dL (ref 0.50–1.10)
Glucose, Bld: 90 mg/dL (ref 65–99)
Potassium: 5.6 mmol/L — ABNORMAL HIGH (ref 3.5–5.3)
Sodium: 139 mmol/L (ref 135–146)
TOTAL PROTEIN: 6.8 g/dL (ref 6.1–8.1)
Total Bilirubin: 0.4 mg/dL (ref 0.2–1.2)

## 2015-06-17 LAB — T4, FREE: Free T4: 0.9 ng/dL (ref 0.80–1.80)

## 2015-06-17 LAB — TSH: TSH: 3.582 u[IU]/mL (ref 0.350–4.500)

## 2015-06-17 MED ORDER — EPINEPHRINE 0.3 MG/0.3ML IJ SOAJ: 0.3000 mg | Freq: Once | INTRAMUSCULAR | Status: DC

## 2015-06-17 MED ORDER — PROPRANOLOL HCL 40 MG PO TABS: 40.0000 mg | ORAL_TABLET | Freq: Two times a day (BID) | ORAL | Status: DC

## 2015-06-17 MED ORDER — CLONIDINE HCL 0.1 MG PO TABS: 0.1000 mg | ORAL_TABLET | Freq: Two times a day (BID) | ORAL | Status: DC

## 2015-06-17 MED ORDER — DOXYCYCLINE HYCLATE 100 MG PO TABS: 100.0000 mg | ORAL_TABLET | Freq: Two times a day (BID) | ORAL | Status: DC

## 2015-06-17 MED ORDER — HYDROXYZINE PAMOATE 25 MG PO CAPS: 25.0000 mg | ORAL_CAPSULE | Freq: Three times a day (TID) | ORAL | Status: DC | PRN

## 2015-06-17 NOTE — Patient Instructions (Signed)
Smoking Cessation, Tips for Success If you are ready to quit smoking, congratulations! You have chosen to help yourself be healthier. Cigarettes bring nicotine, tar, carbon monoxide, and other irritants into your body. Your lungs, heart, and blood vessels will be able to work better without these poisons. There are many different ways to quit smoking. Nicotine gum, nicotine patches, a nicotine inhaler, or nicotine nasal spray can help with physical craving. Hypnosis, support groups, and medicines help break the habit of smoking. WHAT THINGS CAN I DO TO MAKE QUITTING EASIER?  Here are some tips to help you quit for good:  Pick a date when you will quit smoking completely. Tell all of your friends and family about your plan to quit on that date.  Do not try to slowly cut down on the number of cigarettes you are smoking. Pick a quit date and quit smoking completely starting on that day.  Throw away all cigarettes.   Clean and remove all ashtrays from your home, work, and car.  On a card, write down your reasons for quitting. Carry the card with you and read it when you get the urge to smoke.  Cleanse your body of nicotine. Drink enough water and fluids to keep your urine clear or pale yellow. Do this after quitting to flush the nicotine from your body.  Learn to predict your moods. Do not let a bad situation be your excuse to have a cigarette. Some situations in your life might tempt you into wanting a cigarette.  Never have "just one" cigarette. It leads to wanting another and another. Remind yourself of your decision to quit.  Change habits associated with smoking. If you smoked while driving or when feeling stressed, try other activities to replace smoking. Stand up when drinking your coffee. Brush your teeth after eating. Sit in a different chair when you read the paper. Avoid alcohol while trying to quit, and try to drink fewer caffeinated beverages. Alcohol and caffeine may urge you to  smoke.  Avoid foods and drinks that can trigger a desire to smoke, such as sugary or spicy foods and alcohol.  Ask people who smoke not to smoke around you.  Have something planned to do right after eating or having a cup of coffee. For example, plan to take a walk or exercise.  Try a relaxation exercise to calm you down and decrease your stress. Remember, you may be tense and nervous for the first 2 weeks after you quit, but this will pass.  Find new activities to keep your hands busy. Play with a pen, coin, or rubber band. Doodle or draw things on paper.  Brush your teeth right after eating. This will help cut down on the craving for the taste of tobacco after meals. You can also try mouthwash.   Use oral substitutes in place of cigarettes. Try using lemon drops, carrots, cinnamon sticks, or chewing gum. Keep them handy so they are available when you have the urge to smoke.  When you have the urge to smoke, try deep breathing.  Designate your home as a nonsmoking area.  If you are a heavy smoker, ask your health care provider about a prescription for nicotine chewing gum. It can ease your withdrawal from nicotine.  Reward yourself. Set aside the cigarette money you save and buy yourself something nice.  Look for support from others. Join a support group or smoking cessation program. Ask someone at home or at work to help you with your plan   to quit smoking.  Always ask yourself, "Do I need this cigarette or is this just a reflex?" Tell yourself, "Today, I choose not to smoke," or "I do not want to smoke." You are reminding yourself of your decision to quit.  Do not replace cigarette smoking with electronic cigarettes (commonly called e-cigarettes). The safety of e-cigarettes is unknown, and some may contain harmful chemicals.  If you relapse, do not give up! Plan ahead and think about what you will do the next time you get the urge to smoke. HOW WILL I FEEL WHEN I QUIT SMOKING? You  may have symptoms of withdrawal because your body is used to nicotine (the addictive substance in cigarettes). You may crave cigarettes, be irritable, feel very hungry, cough often, get headaches, or have difficulty concentrating. The withdrawal symptoms are only temporary. They are strongest when you first quit but will go away within 10-14 days. When withdrawal symptoms occur, stay in control. Think about your reasons for quitting. Remind yourself that these are signs that your body is healing and getting used to being without cigarettes. Remember that withdrawal symptoms are easier to treat than the major diseases that smoking can cause.  Even after the withdrawal is over, expect periodic urges to smoke. However, these cravings are generally short lived and will go away whether you smoke or not. Do not smoke! WHAT RESOURCES ARE AVAILABLE TO HELP ME QUIT SMOKING? Your health care provider can direct you to community resources or hospitals for support, which may include:  Group support.  Education.  Hypnosis.  Therapy.   This information is not intended to replace advice given to you by your health care provider. Make sure you discuss any questions you have with your health care provider.   Document Released: 05/08/2004 Document Revised: 08/31/2014 Document Reviewed: 01/26/2013 Elsevier Interactive Patient Education 2016 [employer]  

## 2015-06-17 NOTE — Progress Notes (Signed)
Patient ID: [name], female   DOB: [birth year], 29 y.o.   MRN: [id number]   Subjective:  [name] is a 29 y.o. female with hypertension and anxiety. Patient believes she has a sinus infection. She has symptoms of cough with green mucous, headache, facial swelling, sneezing, rhinitis. She is a current 1 ppd smoker. Symptoms have been present for a total of 4 months. She has tried several OTC sinus medications. She has chills and sore throat.   Patient reports that she vomits daily for the past 4 years and is concerned why she is not losing weight. She reports that she feels like it is her anxiety causing her to have chronic nausea. She reports diarrhea several times per day. She states that whenever she goes to urinate she has diarrhea. She does admit to abdominal pain and a mass in her abdomen from last year. Patient reports that she drinks a 40 oz of beer every morning upon awakening to help her relax. She does not feel she is a alcoholic. She denies cocaine use in over 6 months since leaving her ex-boyfriend.   Patient reports that she was off her blood pressure medications for a while but has been going back and forth to the ER for medication refills when she needs the medication.   Current Outpatient Prescriptions  Medication Sig Dispense Refill  . cloNIDine (CATAPRES) 0.1 MG tablet Take 1 tablet (0.1 mg total) by mouth 2 (two) times daily. 28 tablet 0  . propranolol (INDERAL) 40 MG tablet Take 1 tablet (40 mg total) by mouth 2 (two) times daily. 28 tablet 0  . EPINEPHrine 0.3 mg/0.3 mL IJ SOAJ injection Inject 0.3 mLs (0.3 mg total) into the muscle once. 1 Device 0  . ibuprofen (ADVIL,MOTRIN) 200 MG tablet Take 800 mg by mouth every 6 (six) hours as needed for moderate pain.    . [DISCONTINUED] labetalol (NORMODYNE) 100 MG tablet Take 1 tablet (100 mg total) by mouth 2 (two) times daily. 60 tablet 0   No current facility-administered medications for this visit.     Hypertension ROS: taking medications as instructed, no medication side effects noted, no TIA's, no chest pain on exertion, no dyspnea on exertion, no swelling of ankles and no palpitations.   Objective:  BP 126/92 mmHg  Pulse 85  Temp(Src) 98 F (36.7 C)  Resp 16  Ht 5' (1.524 m)  Wt 145 lb 3.2 oz (65.862 kg)  BMI 28.36 kg/m2  SpO2 100%  Appearance alert, well appearing, and in no distress, oriented to person, place, and time and overweight. Physical Exam  Constitutional: She is oriented to person, place, and time.  HENT:  Right Ear: External ear normal.  Left Ear: External ear normal.  Mouth/Throat: Oropharynx is clear and moist.  Eyes: EOM are normal. Pupils are equal, round, and reactive to light. Right eye exhibits no discharge. Left eye exhibits no discharge.  Neck: No thyromegaly present.  Cardiovascular: Normal rate, regular rhythm and normal heart sounds.   Pulmonary/Chest: Effort normal and breath sounds normal. She has no wheezes.  Abdominal: Soft. She exhibits no distension. There is no tenderness. A hernia is present.  Musculoskeletal: She exhibits no edema.  Neurological: She is alert and oriented to person, place, and time.  Skin: Skin is warm and dry.    [name] was seen today for sinusitis.  Diagnoses and all orders for this visit:  Essential hypertension -     cloNIDine (CATAPRES) 0.1 MG tablet; Take 1  tablet (0.1 mg total) by mouth 2 (two) times daily. -     propranolol (INDERAL) 40 MG tablet; Take 1 tablet (40 mg total) by mouth 2 (two) times daily. Patient's BP is likely elevated due to alcohol use this AM. She has only been back on her medication for a few days. Discussed non-compliance and how substance use can affect pressure. Will continue to monitor and advised DASH diet.   Acute upper respiratory infection -     COMPLETE METABOLIC PANEL WITH GFR -     doxycycline (VIBRA-TABS) 100 MG tablet; Take 1 tablet (100 mg total) by mouth 2 (two) times  daily.  Anxiety -     TSH -     T4, Free -     hydrOXYzine (VISTARIL) 25 MG capsule; Take 1 capsule (25 mg total) by mouth 3 (three) times daily as needed. I have given patient information to [facility] and to local substance abuse counselors that are free of charge. I have explained that anxiety can cause nausea. I will not given any benzo's due to her history of cocaine use, she is high risk for addiction and drug misuse.   Alcohol abuse Explained that alcohol use may be causing chronic nausea. I also addressed how chronic alcohol use is a gateway to starting back on cocaine. I highly advise patient to see counseling or a facility to help with detox symptoms. Counseling will benefit patient.  Tobacco use disorder Smoking cessation discussed for 3 minutes, patient is not willing to quit at this time. Will continue to assess on each visit. Discussed increased risk for diseases such as cancer, heart disease, and stroke.   Health care maintenance -     Flu Vaccine QUAD 36+ mos PF IM (Fluarix & Fluzone Quad PF)   Return in about 3 months (around 09/17/2015) for Hypertension.   Ambrose Finland, NP 06/17/2015 2:03 PM

## 2015-06-17 NOTE — Progress Notes (Signed)
Patient here for follow up on her blood pressure Complains of having some sinus issues Patient did state she suffers from panic attacks but stopped taking  Her lexapro

## 2015-06-19 ENCOUNTER — Ambulatory Visit: Payer: Self-pay | Attending: Family Medicine

## 2015-06-19 ENCOUNTER — Other Ambulatory Visit: Payer: Self-pay | Admitting: Internal Medicine

## 2015-06-19 ENCOUNTER — Telehealth: Payer: Self-pay | Admitting: Internal Medicine

## 2015-06-19 ENCOUNTER — Telehealth: Payer: Self-pay

## 2015-06-19 ENCOUNTER — Ambulatory Visit: Payer: Self-pay | Admitting: Internal Medicine

## 2015-06-19 DIAGNOSIS — R748 Abnormal levels of other serum enzymes: Secondary | ICD-10-CM

## 2015-06-19 DIAGNOSIS — E875 Hyperkalemia: Secondary | ICD-10-CM

## 2015-06-19 MED ORDER — FUROSEMIDE 20 MG PO TABS: 20.0000 mg | ORAL_TABLET | Freq: Every day | ORAL | Status: DC

## 2015-06-19 NOTE — Telephone Encounter (Signed)
Nurse called patient to give results, patient verified date of birth. Patient explained she had already received a call from another nurse at [facility] with the same results.

## 2015-06-19 NOTE — Telephone Encounter (Signed)
-----   Message from Ambrose Finland, NP sent at 06/19/2015  8:47 AM EDT ----- Thyroid test is normal. Potassium is elevated---I have sent lasix 20 mg to take for 5 days to help lower levels. Also her liver enzymes are elevated. Please have her come back to be checked for hepatitis. Please also explain to patient that alcohol can damage the liver and I highly suggest that she cut back dramatically. I will place future lab orders.

## 2015-06-19 NOTE — Telephone Encounter (Signed)
Called patient. Patient verified name and date of birth. Patient notified that her thyroid test is normal and her potassium and liver enzymes are elevated. Patient notified that lasix 20mg  has been sent to the pharmacy here at [facility] for her to take for 5 days to help lower her potassium levels. Per Luna Glasgow patient made aware that she needs to come back in for a lab visit to get checked for hepatitis. Explained to patient that alcohol can damage the liver and suggested she cut back dramatically. Patient voiced understanding and was transferred to scheduling to make her lab appointment.

## 2015-06-19 NOTE — Telephone Encounter (Signed)
-----   Message from Valerie A Keck, NP sent at 06/19/2015  8:47 AM EDT ----- Thyroid test is normal. Potassium is elevated---I have sent lasix 20 mg to take for 5 days to help lower levels. Also her liver enzymes are elevated. Please have her come back to be checked for hepatitis. Please also explain to patient that alcohol can damage the liver and I highly suggest that she cut back dramatically. I will place future lab orders. 

## 2015-06-20 LAB — HEPATITIS PANEL, ACUTE
HCV Ab: NEGATIVE
HEP A IGM: NONREACTIVE
HEP B C IGM: NONREACTIVE
Hepatitis B Surface Ag: NEGATIVE

## 2015-06-24 ENCOUNTER — Telehealth: Payer: Self-pay

## 2015-06-24 NOTE — Telephone Encounter (Signed)
-----   Message from Ambrose Finland, NP sent at 06/21/2015  8:01 PM EDT ----- No hepatitis. Please avoid tylenol and alcohol for now and we will repeat liver function in 3 months when she returns for f/u of HTN

## 2015-06-24 NOTE — Telephone Encounter (Signed)
Patient not available Left message to return our call 

## 2015-06-26 ENCOUNTER — Telehealth: Payer: Self-pay

## 2015-06-26 NOTE — Telephone Encounter (Signed)
Patient returned phone call Verified her date of birth and is aware of her current lab results

## 2015-06-26 NOTE — Telephone Encounter (Signed)
Patient returned phone call in regards to results, please f/u

## 2015-07-01 ENCOUNTER — Telehealth: Payer: Self-pay

## 2015-07-01 NOTE — Telephone Encounter (Signed)
Returned patient phone call Patient not available Left message on voice mail to return our call 

## 2015-08-07 ENCOUNTER — Encounter (HOSPITAL_COMMUNITY): Payer: Self-pay | Admitting: Emergency Medicine

## 2015-08-07 ENCOUNTER — Emergency Department (HOSPITAL_COMMUNITY): Payer: Self-pay

## 2015-08-07 ENCOUNTER — Emergency Department (HOSPITAL_COMMUNITY)
Admission: EM | Admit: 2015-08-07 | Discharge: 2015-08-07 | Disposition: A | Discharge status: Home / Self Care | Payer: Self-pay | Attending: Emergency Medicine | Admitting: Emergency Medicine

## 2015-08-07 DIAGNOSIS — Z79899 Other long term (current) drug therapy: Secondary | ICD-10-CM | POA: Insufficient documentation

## 2015-08-07 DIAGNOSIS — Z792 Long term (current) use of antibiotics: Secondary | ICD-10-CM | POA: Insufficient documentation

## 2015-08-07 DIAGNOSIS — R109 Unspecified abdominal pain: Secondary | ICD-10-CM

## 2015-08-07 DIAGNOSIS — F419 Anxiety disorder, unspecified: Secondary | ICD-10-CM | POA: Insufficient documentation

## 2015-08-07 DIAGNOSIS — N12 Tubulo-interstitial nephritis, not specified as acute or chronic: Secondary | ICD-10-CM | POA: Insufficient documentation

## 2015-08-07 DIAGNOSIS — F17210 Nicotine dependence, cigarettes, uncomplicated: Secondary | ICD-10-CM | POA: Insufficient documentation

## 2015-08-07 DIAGNOSIS — Z880 Allergy status to penicillin: Secondary | ICD-10-CM | POA: Insufficient documentation

## 2015-08-07 DIAGNOSIS — R112 Nausea with vomiting, unspecified: Secondary | ICD-10-CM

## 2015-08-07 DIAGNOSIS — I10 Essential (primary) hypertension: Secondary | ICD-10-CM | POA: Insufficient documentation

## 2015-08-07 DIAGNOSIS — Z7982 Long term (current) use of aspirin: Secondary | ICD-10-CM | POA: Insufficient documentation

## 2015-08-07 LAB — COMPREHENSIVE METABOLIC PANEL
ALBUMIN: 4 g/dL (ref 3.5–5.0)
ALT: 60 U/L — ABNORMAL HIGH (ref 14–54)
AST: 48 U/L — ABNORMAL HIGH (ref 15–41)
Alkaline Phosphatase: 99 U/L (ref 38–126)
Anion gap: 13 (ref 5–15)
BUN: <5 mg/dL — ABNORMAL LOW (ref 6–20)
CHLORIDE: 102 mmol/L (ref 101–111)
CO2: 18 mmol/L — AB (ref 22–32)
Calcium: 9 mg/dL (ref 8.9–10.3)
Creatinine, Ser: 0.63 mg/dL (ref 0.44–1.00)
GFR calc Af Amer: >60 mL/min (ref >60)
GFR calc non Af Amer: >60 mL/min (ref >60)
GLUCOSE: 107 mg/dL — AB (ref 65–99)
POTASSIUM: 3.7 mmol/L (ref 3.5–5.1)
SODIUM: 133 mmol/L — AB (ref 135–145)
Total Bilirubin: 0.9 mg/dL (ref 0.3–1.2)
Total Protein: 7.4 g/dL (ref 6.5–8.1)

## 2015-08-07 LAB — URINE MICROSCOPIC-ADD ON

## 2015-08-07 LAB — URINALYSIS, ROUTINE W REFLEX MICROSCOPIC
Bilirubin Urine: NEGATIVE
GLUCOSE, UA: NEGATIVE mg/dL
KETONES UR: 15 mg/dL — AB
NITRITE: POSITIVE — AB
PROTEIN: 100 mg/dL — AB
Specific Gravity, Urine: 1.016 (ref 1.005–1.030)
pH: 5.5 (ref 5.0–8.0)

## 2015-08-07 LAB — CBC
HEMATOCRIT: 44.8 % (ref 36.0–46.0)
HEMOGLOBIN: 15.5 g/dL — AB (ref 12.0–15.0)
MCH: 34.4 pg — AB (ref 26.0–34.0)
MCHC: 34.6 g/dL (ref 30.0–36.0)
MCV: 99.3 fL (ref 78.0–100.0)
Platelets: 177 10*3/uL (ref 150–400)
RBC: 4.51 MIL/uL (ref 3.87–5.11)
RDW: 12.4 % (ref 11.5–15.5)
WBC: 9 10*3/uL (ref 4.0–10.5)

## 2015-08-07 LAB — HCG, QUANTITATIVE, PREGNANCY: hCG, Beta Chain, Quant, S: <1 m[IU]/mL (ref <5)

## 2015-08-07 LAB — LIPASE, BLOOD: LIPASE: 33 U/L (ref 11–51)

## 2015-08-07 MED ORDER — HYDROMORPHONE HCL 1 MG/ML IJ SOLN
0.5000 mg | Freq: Once | INTRAMUSCULAR | Status: AC
  Administered 2015-08-07: 0.5 mg via INTRAVENOUS
  Filled 2015-08-07: qty 1

## 2015-08-07 MED ORDER — SODIUM CHLORIDE 0.9 % IV BOLUS (SEPSIS)
1000.0000 mL | Freq: Once | INTRAVENOUS | Status: AC
  Administered 2015-08-07: 1000 mL via INTRAVENOUS

## 2015-08-07 MED ORDER — ACETAMINOPHEN 325 MG PO TABS
650.0000 mg | ORAL_TABLET | Freq: Once | ORAL | Status: AC
  Administered 2015-08-07: 650 mg via ORAL
  Filled 2015-08-07: qty 2

## 2015-08-07 MED ORDER — ONDANSETRON 4 MG PO TBDP: 4.0000 mg | ORAL_TABLET | Freq: Three times a day (TID) | ORAL | Status: DC | PRN

## 2015-08-07 MED ORDER — DIPHENHYDRAMINE HCL 50 MG/ML IJ SOLN
50.0000 mg | Freq: Once | INTRAMUSCULAR | Status: AC | PRN
  Administered 2015-08-07: 50 mg via INTRAVENOUS
  Filled 2015-08-07: qty 1

## 2015-08-07 MED ORDER — HYDROMORPHONE HCL 1 MG/ML IJ SOLN
1.0000 mg | Freq: Once | INTRAMUSCULAR | Status: AC
  Administered 2015-08-07: 1 mg via INTRAVENOUS
  Filled 2015-08-07: qty 1

## 2015-08-07 MED ORDER — CIPROFLOXACIN HCL 500 MG PO TABS: 500.0000 mg | ORAL_TABLET | Freq: Two times a day (BID) | ORAL | Status: DC

## 2015-08-07 MED ORDER — CIPROFLOXACIN HCL 500 MG PO TABS
500.0000 mg | ORAL_TABLET | Freq: Once | ORAL | Status: AC
  Administered 2015-08-07: 500 mg via ORAL
  Filled 2015-08-07: qty 1

## 2015-08-07 MED ORDER — KETOROLAC TROMETHAMINE 30 MG/ML IJ SOLN
30.0000 mg | Freq: Once | INTRAMUSCULAR | Status: AC
  Administered 2015-08-07: 30 mg via INTRAVENOUS
  Filled 2015-08-07: qty 1

## 2015-08-07 MED ORDER — ONDANSETRON HCL 4 MG/2ML IJ SOLN
4.0000 mg | Freq: Once | INTRAMUSCULAR | Status: AC
  Administered 2015-08-07: 4 mg via INTRAVENOUS
  Filled 2015-08-07: qty 2

## 2015-08-07 MED ORDER — IBUPROFEN 800 MG PO TABS: 800.0000 mg | ORAL_TABLET | Freq: Once | ORAL | Status: DC

## 2015-08-07 MED ORDER — OXYCODONE-ACETAMINOPHEN 5-325 MG PO TABS: 2.0000 | ORAL_TABLET | ORAL | Status: DC | PRN

## 2015-08-07 NOTE — ED Provider Notes (Signed)
2:14 PM Patient was set to be discharged, but was found to have a fever of 102.36F on her discharge vital signs. Pt remains not tachycardic, her blood pressure is stable, and she is still non-toxic appearing. Pt will be given tylenol and motrin and reassessed. Fever was successfully reduced. Patient was reassessed prior to discharge and no changes were found in the patient's status.  Filed Vitals:   08/07/15 1202 08/07/15 1400 08/07/15 1405 08/07/15 1504  BP: 124/66  128/99   Pulse: 50  96   Temp: 98.8 F (37.1 C) 102.1 F (38.9 C)  99.1 F (37.3 C)  TempSrc: Oral Oral  Oral  Resp: 18  18   SpO2: 99%  98%      Anselm Pancoast, PA-C 08/07/15 1505  Rolland Porter, MD 08/08/15 [phone]

## 2015-08-07 NOTE — Discharge Instructions (Signed)
You have been seen today for flank and abdominal pain. Your lab tests and imaging showed the you probably have an infection in her kidney. Follow up with PCP as needed. Return to ED should symptoms worsen. Please take all of your antibiotics until finished!   You may develop abdominal discomfort or diarrhea from the antibiotic.  You may help offset this with probiotics which you can buy or get in yogurt. Do not eat or take the probiotics until 2 hours after your antibiotic.  You should start to feel better in the next 2-3 days;  however, if you do not feel better, you should seek treatment PCP or return to the ED.   Emergency Department Resource Guide 1) Find a Doctor and Pay Out of Pocket Although you won't have to find out who is covered by your insurance plan, it is a good idea to ask around and get recommendations. You will then need to call the office and see if the doctor you have chosen will accept you as a new patient and what types of options they offer for patients who are self-pay. Some doctors offer discounts or will set up payment plans for their patients who do not have insurance, but you will need to ask so you aren't surprised when you get to your appointment.  2) Contact Your Local Health Department Not all health departments have doctors that can see patients for sick visits, but many do, so it is worth a call to see if yours does. If you don't know where your local health department is, you can check in your phone book. The CDC also has a tool to help you locate your state's health department, and many state websites also have listings of all of their local health departments.  3) Find a Walk-in Clinic If your illness is not likely to be very severe or complicated, you may want to try a walk in clinic. These are popping up all over the country in pharmacies, drugstores, and shopping centers. They're usually staffed by nurse practitioners or physician assistants that have been trained  to treat common illnesses and complaints. They're usually fairly quick and inexpensive. However, if you have serious medical issues or chronic medical problems, these are probably not your best option.  No Primary Care Doctor: - Call Health Connect at  [phone] - they can help you locate a primary care doctor that  accepts your insurance, provides certain services, etc. - Physician Referral Service- [phone]  Chronic Pain Problems: Organization         Address  Phone   Notes  [facility]  [phone] Patients need to be referred by their primary care doctor.   Medication Assistance: Organization         Address  Phone   Notes  [facility] Med[facility] [address] [place] [place], [place] [address] [phone] --Must be a resident of [facility] -- Must have NO insurance coverage whatsoever (no Medicaid/ Medicare, etc.) -- The pt. MUST have a primary care doctor that directs their care regularly and follows them in the community   [facility]  [phone]   [employer]  [phone]    Agencies that provide inexpensive medical care: Organization         Address  Phone   Notes  [facility]  [phone]   [facility]    [phone]   [facility] [phone]  [address] [place], [place] [address] [phone]   [facility] [address] [place] [place] [phone]   [facility]anned Parenthood    [phone]   [facility]    [phone]   [facility]  [address], [place] Phone:  [phone], Fax:  [phone] Hours of Operation:  9 am - 6 pm, M-F.  Also accepts Medicaid/Medicare and self-pay.  [facility]  [address], [place] Phone: [phone], Fax: [phone]. Hours of Operation:  8:30 am - 5:30 pm, M-F.  Also accepts Medicaid and self-pay.   [facility] [place] [address] [place] Phone: [phone]   [facility] [address] Natasha Bence [place], [place] [phone], Ext. [phone] Mondays & Thursdays: 7-9 AM.  First 15 patients are seen on a first come, first serve basis.    Medicaid-accepting [facility] Providers:  Organization         Address  Phone   Notes  [facility] [address], [place] [phone] Also accepts self-pay patients.  [facility] [address] Laurell Josephs [place], [place]  [phone]   [facility] [address], [place] [phone]   [facility] [address] [place] [phone]   Renaye Rakers [address], [place]   [phone] Only accepts [place] Access [place] patients after they have their name applied to their card.   Self-Pay (no insurance) in [facility]:  Organization         Address  Phone   Notes  Sickle Cell Patients, [facility] [address] [place], [place] [phone]   [facility] [address] [place], [place] [phone]   [facility] [place]  [address], [place] [phone]   [facility]/Dr. Osei-Bonsu  [address], [place] or [address], [place] [phone] Phone number for both [place] and [place] locations is the same.  [facility] [address] [place] [phone]   [facility] [address] [place] or [address] [phone]   [facility] [address] [place] [phone], phone; [phone], fax Sees patients 1st and 3rd Saturday of every month.  Must not qualify for public or private insurance (i.e. Medicaid, Medicare, [place] Health Choice, Veterans' Benefits)  Household income should be no  more than 200% of the poverty level The clinic cannot treat you if you are pregnant or think you are pregnant  Sexually transmitted diseases are not treated at the clinic.    Dental Care: Organization         Address  Phone  Notes  [facility] [address] [place], [place] [phone] Accepts children up to age 62 who are enrolled in [place] or [place] Health Choice; pregnant women with a Medicaid card; and children who have applied for Medicaid or [place] Health Choice, but were declined, whose parents can pay a reduced fee at time of service.  [facility] of [facility]  [address], [place] [phone] Accepts children up to age 26 who are enrolled in [place] or  [place] Health Choice; pregnant women with a Medicaid card; and children who have applied for Medicaid or [place] Health Choice, but were declined, whose parents can pay a reduced fee at time of service.  [facility]  [address] [place], [place] [phone] Patients are seen by appointment only. Walk-ins are not accepted. [facility] will see patients 33 years of age and older. Monday - Tuesday (8am-5pm) Most Wednesdays (8:30-5pm) $30 per visit, cash only  [facility]  [address], [facility] [phone] Patients are seen by appointment only. Walk-ins are not accepted. [facility] will see patients 4 years of age and older. One Wednesday Evening (Monthly: Volunteer Based).  $30 per visit, cash only  [employer]  [phone] for adults; Children under age 21, call Graduate Pediatric Dentistry at [phone]. Children aged 18-14, please call [phone] to request a pediatric application.  Dental services are provided in all areas of dental care including fillings, crowns and bridges, complete and partial dentures, implants, gum treatment, root canals,  and extractions. Preventive care is also provided. Treatment is provided to both adults and children. Patients are selected via a lottery and there is often a waiting list.   [facility] [address] [place]  [phone] [email or web address]   [facility] [address] [place], [place] [phone], Ext. [phone] Second and Fourth Thursday of each month, opens at 6:30 AM; Clinic ends at 9 AM.  Patients are seen on a first-come first-served basis, and a limited number are seen during each clinic.   [facility]  [address] Ether Griffins [place], [place] [phone]   Eligibility Requirements You must have lived in [place], [place], or [place] counties for at least the last three months.   You cannot be eligible for state or federal sponsored [place], including [employer], [place], or [employer].   You generally cannot be eligible for healthcare insurance through your employer.    How to apply: Eligibility screenings are held every Tuesday and Wednesday afternoon from 1:00 pm until 4:00 pm. You do not need an appointment for the interview!  [facility] [address] [place], [place] [phone]   [facility] Health Department  [phone]   [facility] Health Department  [phone]   [facility] Health Department  [phone]    Behavioral Health Resources in the Community: Intensive Outpatient Programs Organization         Address  Phone  Notes  [facility] Services [address] [place], [place] [phone]   [facility] Outpatient [address] [place], [place] [phone]   [facility] [address], [place], [place]  [phone]   [facility] Mental Health [address]  [place], [place] [phone] or [phone]   Substance Abuse Resources Organization         Address  Phone  Notes  [facility]  [phone]    [facility]  [phone]   [facility]  [phone]   Floydene Flock  [phone]   Residential & Outpatient Substance Abuse Program  [phone]   Psychological Services Organization         Address  Phone  Notes  [facility]  [phone]   [facility] Services  [phone]   [facility] [address] [place] [phone] or [phone]  Mobile Crisis Teams Organization         Address  Phone  Notes  [facility], Mobile Crisis Care Unit  [phone]   [facility]  [address] [place], [place] [phone]   Doristine Locks [address] Ali [place] [phone]    Self-Help/Support Groups Organization         Address  Phone             Notes  [facility] [place] - variety of support groups  [phone]- [phone] Call for more information  Narcotics Anonymous (NA), [facility] [address], [employer] [place]  2 meetings at this location   Statistician         Address  Phone  Notes  [facility] [address],    [place]  [phone]   [facility]  [address] [place] [address], [place], [place] [phone]   [facility] [address] [place], [place] [phone] Admissions: 8am-3pm M-F  [facility] [address]    [place], [place] [phone]   [facility] [address] [place], [place], [place] [phone]   [facility] [address],  [place], [place] [phone]   [facility] - Intensive Outpatient [address] Laurell Josephs [address], [place], [place] [phone]   [facility] ([facility] [address] [place]  [place], [place] [phone] or [phone]   [facility] ([facility]) [address], [place], [place] [phone] Accepts Medicaid  [facility] [address]    [place] [phone] Substance Abuse/Addiction Treatment   [facility] Organization         Address  Phone  Notes  [facility]  [phone]   Angie Fava, PhD [address] Ervin Knack [place], [place]   [phone] or [phone]   [facility]   [address] [place], [place] [phone]   [facility] [address] [place], [place] [phone] Insurance/Medicaid/sponsorship through [facility] and Families [address]                                    [place], [place] [phone] Therapy/tele-psych/case  [facility] [address] [place], [place] [phone]    Dr. Lolly Mustache  [phone]   [facility]  [employer] [place] Dept. 1) [address], [place] 2) [address], [place] 3)  [address], [place] [phone]  [phone]   [facility] Child Abuse Hotline [phone] or 7241768530 (After Hours)

## 2015-08-07 NOTE — ED Notes (Signed)
Sent up urine already

## 2015-08-07 NOTE — Progress Notes (Addendum)
Went to pt room and she was not present in room This pt needs to return a call to [facility]

## 2015-08-07 NOTE — ED Notes (Signed)
Pt c/o rt sided abd and flank pain.  Denies dysuria.  States she has had "a little diarrhea" and has vomited 3 times in the last 24 hrs because "she tries not to".

## 2015-08-07 NOTE — ED Notes (Signed)
Provider in room, prn pain med to be held for a few/ until pt request it. Per provider

## 2015-08-07 NOTE — ED Notes (Addendum)
Shawn PA is aware of fever.

## 2015-08-07 NOTE — ED Provider Notes (Signed)
CSN: [id number]     Arrival date & time 08/07/15  3009 History   First MD Initiated Contact with Patient 08/07/15 [phone]     Chief Complaint  Patient presents with  . Abdominal Pain  . Flank Pain     (Consider location/radiation/quality/duration/timing/severity/associated sxs/prior Treatment) HPI   [name] is a 29 y.o. female, with a history of hypertension, 5x C-sections, ectopic pregnancy, and anxiety, presenting to the ED with right flank pain beginning two days ago. Pain is 10/10, dull ache, radiating to the RLQ of her abdomen, increasing since it began. Pt has nausea and has vomited three times in the last 24 hours. Pt has not taken anything for the pain. Pt denies fever/chills, dysuria, hematuria, vaginal discharge/bleeding, or any other pain or complaints. LMP was 11/27. Pt last C-section was in 2010. Ectopic pregnancy was in 1999.    Past Medical History  Diagnosis Date  . Hypertension   . Anxiety    Past Surgical History  Procedure Laterality Date  . Artery repair    . Cesarean section      x 5  . Tubal ligation    . Mouth surgery    . Tear duct probing     Family History  Problem Relation Age of Onset  . CAD Other   . Diabetes Other   . Hypertension Other   . Cancer Other   . Thyroid disease Other    Social History  Substance Use Topics  . Smoking status: Current Every Day Smoker    Types: Cigarettes  . Smokeless tobacco: None  . Alcohol Use: Yes     Comment: occ   OB History    No data available     Review of Systems  Constitutional: Negative for fever, chills, diaphoresis and unexpected weight change.  Respiratory: Negative for cough, chest tightness and shortness of breath.   Cardiovascular: Negative for chest pain, palpitations and leg swelling.  Gastrointestinal: Positive for nausea, vomiting and abdominal pain. Negative for diarrhea and constipation.  Genitourinary: Positive for flank pain. Negative for dysuria, hematuria, vaginal  bleeding, vaginal discharge, vaginal pain and pelvic pain.  Musculoskeletal: Positive for back pain.  Skin: Negative for color change and pallor.  Neurological: Negative for dizziness, syncope, weakness and light-headedness.  All other systems reviewed and are negative.     Allergies  Acetaminophen; Codeine; Lisinopril; and Penicillins  Home Medications   Prior to Admission medications   Medication Sig Start Date End Date Taking? Authorizing Provider  albuterol (PROVENTIL HFA;VENTOLIN HFA) 108 (90 BASE) MCG/ACT inhaler Inhale 2 puffs into the lungs every 6 (six) hours as needed for wheezing or shortness of breath.   Yes Historical Provider, MD  aspirin 325 MG EC tablet Take 325 mg by mouth daily as needed for pain.   Yes Historical Provider, MD  cloNIDine (CATAPRES) 0.1 MG tablet Take 1 tablet (0.1 mg total) by mouth 2 (two) times daily. 06/17/15  Yes Ambrose Finland, NP  EPINEPHrine 0.3 mg/0.3 mL IJ SOAJ injection Inject 0.3 mLs (0.3 mg total) into the muscle once. 06/17/15  Yes Ambrose Finland, NP  furosemide (LASIX) 20 MG tablet Take 1 tablet (20 mg total) by mouth daily. 06/19/15  Yes Ambrose Finland, NP  hydrOXYzine (VISTARIL) 25 MG capsule Take 1 capsule (25 mg total) by mouth 3 (three) times daily as needed. 06/17/15  Yes Ambrose Finland, NP  ibuprofen (ADVIL,MOTRIN) 200 MG tablet Take 800 mg by mouth every 6 (six) hours as  needed for moderate pain.   Yes Historical Provider, MD  propranolol (INDERAL) 40 MG tablet Take 1 tablet (40 mg total) by mouth 2 (two) times daily. 06/17/15  Yes Ambrose Finland, NP  ciprofloxacin (CIPRO) 500 MG tablet Take 1 tablet (500 mg total) by mouth 2 (two) times daily. 08/07/15   Theresea Trautmann C Roneka Gilpin, PA-C  doxycycline (VIBRA-TABS) 100 MG tablet Take 1 tablet (100 mg total) by mouth 2 (two) times daily. 06/17/15   Ambrose Finland, NP  ondansetron (ZOFRAN ODT) 4 MG disintegrating tablet Take 1 tablet (4 mg total) by mouth every 8 (eight) hours as needed for nausea or  vomiting. 08/07/15   Ann Bohne C Guillermina Shaft, PA-C  oxyCODONE-acetaminophen (PERCOCET/ROXICET) 5-325 MG tablet Take 2 tablets by mouth every 4 (four) hours as needed for severe pain. Take with food 08/07/15   Sindhu Nguyen C Laverne Klugh, PA-C   BP 124/66 mmHg  Pulse 50  Temp(Src) 98.8 F (37.1 C) (Oral)  Resp 18  SpO2 99%  LMP 07/21/2015 Physical Exam  Constitutional: She appears well-developed and well-nourished. No distress.  HENT:  Head: Normocephalic and atraumatic.  Eyes: Conjunctivae are normal. Pupils are equal, round, and reactive to light.  Cardiovascular: Normal rate, regular rhythm and normal heart sounds.   Pulmonary/Chest: Effort normal and breath sounds normal. No respiratory distress.  Abdominal: Soft. Normal appearance and bowel sounds are normal. There is tenderness in the right upper quadrant and right lower quadrant. There is CVA tenderness (Right).  Well-healed longitudinal c-section scar noted below the umbilicus.  Musculoskeletal: She exhibits no edema or tenderness.  Neurological: She is alert.  Skin: Skin is warm and dry. She is not diaphoretic.  Nursing note and vitals reviewed.   ED Course  Procedures (including critical care time) Labs Review Labs Reviewed  COMPREHENSIVE METABOLIC PANEL - Abnormal; Notable for the following:    Sodium 133 (*)    CO2 18 (*)    Glucose, Bld 107 (*)    BUN <5 (*)    AST 48 (*)    ALT 60 (*)    All other components within normal limits  CBC - Abnormal; Notable for the following:    Hemoglobin 15.5 (*)    MCH 34.4 (*)    All other components within normal limits  URINALYSIS, ROUTINE W REFLEX MICROSCOPIC (NOT AT [facility]) - Abnormal; Notable for the following:    Color, Urine AMBER (*)    APPearance CLOUDY (*)    Hgb urine dipstick TRACE (*)    Ketones, ur 15 (*)    Protein, ur 100 (*)    Nitrite POSITIVE (*)    Leukocytes, UA SMALL (*)    All other components within normal limits  URINE MICROSCOPIC-ADD ON - Abnormal; Notable for the following:     Squamous Epithelial / LPF 6-30 (*)    Bacteria, UA MANY (*)    All other components within normal limits  URINE CULTURE  LIPASE, BLOOD  HCG, QUANTITATIVE, PREGNANCY    Imaging Review Ct Renal Stone Study  08/07/2015  CLINICAL DATA:  Right abdominal and flank pain with vomiting x3 in the past 24 hours. Question urinary tract stone. Initial encounter. EXAM: CT ABDOMEN AND PELVIS WITHOUT CONTRAST TECHNIQUE: Multidetector CT imaging of the abdomen and pelvis was performed following the standard protocol without IV contrast. COMPARISON:  None. FINDINGS: The lung bases are clear.  No pleural or pericardial effusion. There is stranding about the right kidney. There is no hydronephrosis on the right or left and  no urinary tract stones are identified. The urinary bladder is incompletely distended but otherwise unremarkable. The uterus is unremarkable. A simple appearing left ovarian cyst measures 3.4 cm in diameter. The right ovary and uterus are unremarkable. The liver is diffusely and markedly low attenuating consistent with fatty infiltration. No focal liver lesion is seen. The spleen, adrenal glands, pancreas and biliary tree are unremarkable. The stomach and small and large bowel appear normal. No evidence of appendicitis is identified. The patient has a small fat containing right paraumbilical hernia. No lymphadenopathy or fluid collection is seen. No focal bony abnormality is identified. IMPRESSION: Negative for hydronephrosis or urinary tract stone. Stranding about the right kidney is most worrisome for infectious or inflammatory process such as pyelonephritis. Marked, diffuse fatty infiltration of the liver. Small right periumbilical fat containing hernia. 3.4 cm left ovarian cyst is likely benign but cannot be definitively characterized. Recommend pelvic ultrasound in 6-12 weeks for further evaluation. This recommendation follows ACR consensus guidelines: White Paper of the ACR Incidental Findings  Committee II on Adnexal Findings. J Am Coll Radiol [phone]. Electronically Signed   By: Drusilla Kanner M.D.   On: 08/07/2015 12:25   I have personally reviewed and evaluated these images and lab results as part of my medical decision-making.   EKG Interpretation None      MDM   Final diagnoses:  Right flank pain  Non-intractable vomiting with nausea, vomiting of unspecified type  Pyelonephritis    [name] presents with right flank pain radiating into her abdomen for the last 2 days.  Findings and plan of care discussed with Rolland Porter, MD.  Patient's presentation and history of present illness is suspicious for kidney stone vs appendicitis vs pyelonephritis. Pain management and labs ordered as well as CT renal study. Patient's pain improved with Toradol. UA shows probable UTI. CBC and lipase without abnormality. CMP without significant abnormality. Patient's nausea is controlled with Zofran.  11:40 AM patient states that her pain is much more controlled and her nausea is gone. Patients presentation combined with the UA and CT results indicate probable pyelonephritis. Patient is well-appearing, afebrile, not tachycardic, not hypotensive, and shows no signs of sepsis.  CT results and lab findings were communicated with patient as well as the proposed plan of care. Patient voiced understanding of her instructions, agreed to the plan, and is comfortable with discharge.  Anselm Pancoast, PA-C 08/07/15 1307  Rolland Porter, MD 08/07/15 1344

## 2015-08-09 LAB — URINE CULTURE

## 2015-08-11 ENCOUNTER — Telehealth (HOSPITAL_COMMUNITY): Payer: Self-pay

## 2015-08-11 NOTE — Telephone Encounter (Signed)
Post ED Visit - Positive Culture Follow-up  Culture report reviewed by antimicrobial stewardship pharmacist:   Enzo Bi, Pharm.D.  Celedonio Miyamoto, Pharm.D., BCPS  Garvin Fila, Pharm.D.  Georgina Pillion, Pharm.D., BCPS  [place], [address].D., BCPS, AAHIVP  Estella Husk, Pharm.D., BCPS, AAHIVP  Tennis Must, Pharm.D.  Sherle Poe, [place].D.  Positive urine culture, >/= 100,000 colonies -> E Coli Treated with Ciprofloxacin, organism sensitive to the same and no further patient follow-up is required at this time.  Arvid Right 08/11/2015, 5:59 AM

## 2015-08-12 ENCOUNTER — Telehealth (HOSPITAL_BASED_OUTPATIENT_CLINIC_OR_DEPARTMENT_OTHER): Payer: Self-pay | Admitting: Emergency Medicine

## 2015-08-12 NOTE — Telephone Encounter (Signed)
Post ED Visit - Positive Culture Follow-up  Culture report reviewed by antimicrobial stewardship pharmacist:  []  Enzo Bi, Pharm.D. [x]  Celedonio Miyamoto, Pharm.D., BCPS []  Garvin Fila, Pharm.D. []  Georgina Pillion, Pharm.D., BCPS []  [place], [place].D., BCPS, AAHIVP []  Estella Husk, Pharm.D., BCPS, AAHIVP []  Tennis Must, Pharm.D. []  Sherle Poe, [place].D.  Positive urine culture Treated with ciprofloxacin, organism sensitive to the same and no further patient follow-up is required at this time.  Berle Mull 08/12/2015, 12:21 PM

## 2015-09-05 MED FILL — cloNIDine HCL 0.1 MG TABS: 0.1 | 30 days supply | Qty: 60 | Fill #1

## 2015-09-05 MED FILL — ?PROPRANOLOL 40 MG TABLET: 40 | 30 days supply | Qty: 60 | Fill #1

## 2015-10-25 MED FILL — PROPRANOLOL 40 MG TABLET: 40 | 30 days supply | Qty: 60 | Fill #2

## 2015-10-25 MED FILL — cloNIDine HCL 0.1 MG TABS: 0.1 | 30 days supply | Qty: 60 | Fill #2

## 2015-11-01 ENCOUNTER — Ambulatory Visit: Payer: Self-pay | Admitting: Internal Medicine

## 2015-11-13 ENCOUNTER — Ambulatory Visit: Payer: Self-pay | Admitting: Internal Medicine

## 2015-12-19 MED FILL — cloNIDine HCL 0.1 MG TABS: 0.1 | 30 days supply | Qty: 60 | Fill #3

## 2015-12-19 MED FILL — PROPRANOLOL 40 MG TABLET: 40 | 30 days supply | Qty: 60 | Fill #3

## 2016-01-26 ENCOUNTER — Encounter (HOSPITAL_COMMUNITY): Payer: Self-pay

## 2016-01-26 DIAGNOSIS — Z7982 Long term (current) use of aspirin: Secondary | ICD-10-CM | POA: Insufficient documentation

## 2016-01-26 DIAGNOSIS — I10 Essential (primary) hypertension: Secondary | ICD-10-CM | POA: Insufficient documentation

## 2016-01-26 DIAGNOSIS — Z880 Allergy status to penicillin: Secondary | ICD-10-CM | POA: Insufficient documentation

## 2016-01-26 DIAGNOSIS — Z79899 Other long term (current) drug therapy: Secondary | ICD-10-CM | POA: Insufficient documentation

## 2016-01-26 DIAGNOSIS — Z9889 Other specified postprocedural states: Secondary | ICD-10-CM | POA: Insufficient documentation

## 2016-01-26 DIAGNOSIS — F1410 Cocaine abuse, uncomplicated: Secondary | ICD-10-CM | POA: Insufficient documentation

## 2016-01-26 DIAGNOSIS — K429 Umbilical hernia without obstruction or gangrene: Secondary | ICD-10-CM | POA: Insufficient documentation

## 2016-01-26 DIAGNOSIS — F419 Anxiety disorder, unspecified: Secondary | ICD-10-CM | POA: Insufficient documentation

## 2016-01-26 DIAGNOSIS — R6883 Chills (without fever): Secondary | ICD-10-CM | POA: Insufficient documentation

## 2016-01-26 DIAGNOSIS — F17210 Nicotine dependence, cigarettes, uncomplicated: Secondary | ICD-10-CM | POA: Insufficient documentation

## 2016-01-26 DIAGNOSIS — Z9851 Tubal ligation status: Secondary | ICD-10-CM | POA: Insufficient documentation

## 2016-01-26 DIAGNOSIS — N83201 Unspecified ovarian cyst, right side: Secondary | ICD-10-CM | POA: Insufficient documentation

## 2016-01-26 DIAGNOSIS — Z792 Long term (current) use of antibiotics: Secondary | ICD-10-CM | POA: Insufficient documentation

## 2016-01-26 DIAGNOSIS — R197 Diarrhea, unspecified: Secondary | ICD-10-CM | POA: Insufficient documentation

## 2016-01-26 DIAGNOSIS — Z3202 Encounter for pregnancy test, result negative: Secondary | ICD-10-CM | POA: Insufficient documentation

## 2016-01-26 LAB — URINALYSIS, ROUTINE W REFLEX MICROSCOPIC
Bilirubin Urine: NEGATIVE
Glucose, UA: NEGATIVE mg/dL
HGB URINE DIPSTICK: NEGATIVE
Ketones, ur: NEGATIVE mg/dL
LEUKOCYTES UA: NEGATIVE
NITRITE: NEGATIVE
PROTEIN: NEGATIVE mg/dL
Specific Gravity, Urine: 1.007 (ref 1.005–1.030)
pH: 5.5 (ref 5.0–8.0)

## 2016-01-26 LAB — COMPREHENSIVE METABOLIC PANEL
ALK PHOS: 93 U/L (ref 38–126)
ALT: 36 U/L (ref 14–54)
ANION GAP: 8 (ref 5–15)
AST: 61 U/L — ABNORMAL HIGH (ref 15–41)
Albumin: 4 g/dL (ref 3.5–5.0)
BUN: <5 mg/dL — ABNORMAL LOW (ref 6–20)
CALCIUM: 9.3 mg/dL (ref 8.9–10.3)
CO2: 25 mmol/L (ref 22–32)
Chloride: 106 mmol/L (ref 101–111)
Creatinine, Ser: 0.62 mg/dL (ref 0.44–1.00)
Glucose, Bld: 120 mg/dL — ABNORMAL HIGH (ref 65–99)
Potassium: 3.8 mmol/L (ref 3.5–5.1)
Sodium: 139 mmol/L (ref 135–145)
TOTAL PROTEIN: 7 g/dL (ref 6.5–8.1)
Total Bilirubin: 0.5 mg/dL (ref 0.3–1.2)

## 2016-01-26 LAB — I-STAT BETA HCG BLOOD, ED (MC, WL, AP ONLY)

## 2016-01-26 LAB — CBC
HCT: 42.2 % (ref 36.0–46.0)
Hemoglobin: 14.3 g/dL (ref 12.0–15.0)
MCH: 35.5 pg — AB (ref 26.0–34.0)
MCHC: 33.9 g/dL (ref 30.0–36.0)
MCV: 104.7 fL — ABNORMAL HIGH (ref 78.0–100.0)
PLATELETS: 182 10*3/uL (ref 150–400)
RBC: 4.03 MIL/uL (ref 3.87–5.11)
RDW: 12.5 % (ref 11.5–15.5)
WBC: 5.1 10*3/uL (ref 4.0–10.5)

## 2016-01-26 LAB — LIPASE, BLOOD: Lipase: 84 U/L — ABNORMAL HIGH (ref 11–51)

## 2016-01-26 NOTE — ED Notes (Addendum)
Patient complains of abdominal pain at right umbilicus x 6 hours. Reports multiple episodes of vomiting and diarrhea with same.  Guarding right side of abdomen and pushed hand away on palpation. Smells of etoh.  States she used cocaine 3 days ago. Appears very anxious

## 2016-01-27 ENCOUNTER — Emergency Department (HOSPITAL_COMMUNITY)
Admission: EM | Admit: 2016-01-27 | Discharge: 2016-01-27 | Disposition: A | Discharge status: Home / Self Care | Payer: Self-pay | Attending: Emergency Medicine | Admitting: Emergency Medicine

## 2016-01-27 ENCOUNTER — Emergency Department (HOSPITAL_COMMUNITY): Payer: Self-pay

## 2016-01-27 DIAGNOSIS — R112 Nausea with vomiting, unspecified: Secondary | ICD-10-CM

## 2016-01-27 DIAGNOSIS — N83201 Unspecified ovarian cyst, right side: Secondary | ICD-10-CM

## 2016-01-27 DIAGNOSIS — R197 Diarrhea, unspecified: Secondary | ICD-10-CM

## 2016-01-27 DIAGNOSIS — K429 Umbilical hernia without obstruction or gangrene: Secondary | ICD-10-CM

## 2016-01-27 LAB — RAPID URINE DRUG SCREEN, HOSP PERFORMED
Amphetamines: NOT DETECTED
BENZODIAZEPINES: NOT DETECTED
Barbiturates: NOT DETECTED
COCAINE: POSITIVE — AB
OPIATES: NOT DETECTED
Tetrahydrocannabinol: NOT DETECTED

## 2016-01-27 MED ORDER — IOPAMIDOL (ISOVUE-300) INJECTION 61%
INTRAVENOUS | Status: AC
  Administered 2016-01-27: 100 mL
  Filled 2016-01-27: qty 100

## 2016-01-27 MED ORDER — ONDANSETRON HCL 4 MG/2ML IJ SOLN
4.0000 mg | Freq: Once | INTRAMUSCULAR | Status: AC
  Administered 2016-01-27: 4 mg via INTRAVENOUS
  Filled 2016-01-27: qty 2

## 2016-01-27 MED ORDER — SODIUM CHLORIDE 0.9 % IV BOLUS (SEPSIS)
1000.0000 mL | Freq: Once | INTRAVENOUS | Status: AC
  Administered 2016-01-27: 1000 mL via INTRAVENOUS

## 2016-01-27 MED ORDER — TRAMADOL HCL 50 MG PO TABS
50.0000 mg | ORAL_TABLET | Freq: Once | ORAL | Status: AC
  Administered 2016-01-27: 50 mg via ORAL
  Filled 2016-01-27: qty 1

## 2016-01-27 MED ORDER — MORPHINE SULFATE (PF) 4 MG/ML IV SOLN
4.0000 mg | Freq: Once | INTRAVENOUS | Status: AC
  Administered 2016-01-27: 4 mg via INTRAVENOUS
  Filled 2016-01-27: qty 1

## 2016-01-27 MED ORDER — ALPRAZOLAM 0.25 MG PO TABS
0.5000 mg | ORAL_TABLET | Freq: Once | ORAL | Status: AC
  Administered 2016-01-27: 0.5 mg via ORAL
  Filled 2016-01-27: qty 2

## 2016-01-27 MED ORDER — ONDANSETRON 4 MG PO TBDP: 4.0000 mg | ORAL_TABLET | Freq: Three times a day (TID) | ORAL | Status: DC | PRN

## 2016-01-27 MED ORDER — TRAMADOL HCL 50 MG PO TABS: 50.0000 mg | ORAL_TABLET | Freq: Four times a day (QID) | ORAL | Status: DC | PRN

## 2016-01-27 MED ORDER — SODIUM CHLORIDE 0.9 % IV SOLN: INTRAVENOUS | Status: DC

## 2016-01-27 MED ORDER — SODIUM CHLORIDE 0.9 % IV BOLUS (SEPSIS)
1000.0000 mL | Freq: Once | INTRAVENOUS | Status: AC
Start: 2016-01-27 — End: 2016-01-27
  Administered 2016-01-27: 1000 mL via INTRAVENOUS

## 2016-01-27 MED ORDER — LORAZEPAM 2 MG/ML IJ SOLN
1.0000 mg | Freq: Once | INTRAMUSCULAR | Status: DC
  Filled 2016-01-27: qty 1

## 2016-01-27 NOTE — Discharge Instructions (Signed)
Hernia, Adult  A hernia is the bulging of an organ or tissue through a weak spot in the muscles of the abdomen (abdominal wall). Hernias develop most often near the navel or groin.  There are many kinds of hernias. Common kinds include:   Femoral hernia. This kind of hernia develops under the groin in the upper thigh area.   Inguinal hernia. This kind of hernia develops in the groin or scrotum.   Umbilical hernia. This kind of hernia develops near the navel.   Hiatal hernia. This kind of hernia causes part of the stomach to be pushed up into the chest.   Incisional hernia. This kind of hernia bulges through a scar from an abdominal surgery.  CAUSES  This condition may be caused by:   Heavy lifting.   Coughing over a long period of time.   Straining to have a bowel movement.   An incision made during an abdominal surgery.   A birth defect (congenital defect).   Excess weight or obesity.   Smoking.   Poor nutrition.   Cystic fibrosis.   Excess fluid in the abdomen.   Undescended testicles.  SYMPTOMS  Symptoms of a hernia include:   A lump on the abdomen. This is the first sign of a hernia. The lump may become more obvious with standing, straining, or coughing. It may get bigger over time if it is not treated or if the condition causing it is not treated.   Pain. A hernia is usually painless, but it may become painful over time if treatment is delayed. The pain is usually dull and may get worse with standing or lifting heavy objects.  Sometimes a hernia gets tightly squeezed in the weak spot (strangulated) or stuck there (incarcerated) and causes additional symptoms. These symptoms may include:   Vomiting.   Nausea.   Constipation.   Irritability.  DIAGNOSIS  A hernia may be diagnosed with:   A physical exam. During the exam your health care provider may ask you to cough or to make a specific movement, because a hernia is usually more visible when you move.   Imaging tests. These can include:     X-rays.    Ultrasound.    CT scan.  TREATMENT  A hernia that is small and painless may not need to be treated. A hernia that is large or painful may be treated with surgery. Inguinal hernias may be treated with surgery to prevent incarceration or strangulation. Strangulated hernias are always treated with surgery, because lack of blood to the trapped organ or tissue can cause it to die.  Surgery to treat a hernia involves pushing the bulge back into place and repairing the weak part of the abdomen.  HOME CARE INSTRUCTIONS   Avoid straining.   Do not lift anything heavier than 10 lb (4.5 kg).   Lift with your leg muscles, not your back muscles. This helps avoid strain.   When coughing, try to cough gently.   Prevent constipation. Constipation leads to straining with bowel movements, which can make a hernia worse or cause a hernia repair to break down. You can prevent constipation by:    Eating a high-fiber diet that includes plenty of fruits and vegetables.    Drinking enough fluids to keep your urine clear or pale yellow. Aim to drink 6-8 glasses of water per day.    Using a stool softener as directed by your health care provider.   Lose weight, if you are overweight.     Do not use any tobacco products, including cigarettes, chewing tobacco, or electronic cigarettes. If you need help quitting, ask your health care provider.   Keep all follow-up visits as directed by your health care provider. This is important. Your health care provider may need to monitor your condition.  SEEK MEDICAL CARE IF:   You have swelling, redness, and pain in the affected area.   Your bowel habits change.  SEEK IMMEDIATE MEDICAL CARE IF:   You have a fever.   You have abdominal pain that is getting worse.   You feel nauseous or you vomit.   You cannot push the hernia back in place by gently pressing on it while you are lying down.   The hernia:    Changes in shape or size.    Is stuck outside the abdomen.    Becomes  discolored.    Feels hard or tender.     This information is not intended to replace advice given to you by your health care provider. Make sure you discuss any questions you have with your health care provider.     Document Released: 08/10/2005 Document Revised: 08/31/2014 Document Reviewed: 06/20/2014  Elsevier Interactive Patient Education 2016 [employer]    Nausea and Vomiting  Nausea is a sick feeling that often comes before throwing up (vomiting). Vomiting is a reflex where stomach contents come out of your mouth. Vomiting can cause severe loss of body fluids (dehydration). Children and elderly adults can become dehydrated quickly, especially if they also have diarrhea. Nausea and vomiting are symptoms of a condition or disease. It is important to find the cause of your symptoms.  CAUSES    Direct irritation of the stomach lining. This irritation can result from increased acid production (gastroesophageal reflux disease), infection, food poisoning, taking certain medicines (such as nonsteroidal anti-inflammatory drugs), alcohol use, or tobacco use.   Signals from the brain.These signals could be caused by a headache, heat exposure, an inner ear disturbance, increased pressure in the brain from injury, infection, a tumor, or a concussion, pain, emotional stimulus, or metabolic problems.   An obstruction in the gastrointestinal tract (bowel obstruction).   Illnesses such as diabetes, hepatitis, gallbladder problems, appendicitis, kidney problems, cancer, sepsis, atypical symptoms of a heart attack, or eating disorders.   Medical treatments such as chemotherapy and radiation.   Receiving medicine that makes you sleep (general anesthetic) during surgery.  DIAGNOSIS  Your caregiver may ask for tests to be done if the problems do not improve after a few days. Tests may also be done if symptoms are severe or if the reason for the nausea and vomiting is not clear. Tests may include:   Urine tests.   Blood  tests.   Stool tests.   Cultures (to look for evidence of infection).   X-rays or other imaging studies.  Test results can help your caregiver make decisions about treatment or the need for additional tests.  TREATMENT  You need to stay well hydrated. Drink frequently but in small amounts.You may wish to drink water, sports drinks, clear broth, or eat frozen ice pops or gelatin dessert to help stay hydrated.When you eat, eating slowly may help prevent nausea.There are also some antinausea medicines that may help prevent nausea.  HOME CARE INSTRUCTIONS    Take all medicine as directed by your caregiver.   If you do not have an appetite, do not force yourself to eat. However, you must continue to drink fluids.   If you   have an appetite, eat a normal diet unless your caregiver tells you differently.    Eat a variety of complex carbohydrates (rice, wheat, potatoes, bread), lean meats, yogurt, fruits, and vegetables.    Avoid high-fat foods because they are more difficult to digest.   Drink enough water and fluids to keep your urine clear or pale yellow.   If you are dehydrated, ask your caregiver for specific rehydration instructions. Signs of dehydration may include:    Severe thirst.    Dry lips and mouth.    Dizziness.    Dark urine.    Decreasing urine frequency and amount.    Confusion.    Rapid breathing or pulse.  SEEK IMMEDIATE MEDICAL CARE IF:    You have blood or brown flecks (like coffee grounds) in your vomit.   You have black or bloody stools.   You have a severe headache or stiff neck.   You are confused.   You have severe abdominal pain.   You have chest pain or trouble breathing.   You do not urinate at least once every 8 hours.   You develop cold or clammy skin.   You continue to vomit for longer than 24 to 48 hours.   You have a fever.  MAKE SURE YOU:    Understand these instructions.   Will watch your condition.   Will get help right away if you are not doing well or get  worse.     This information is not intended to replace advice given to you by your health care provider. Make sure you discuss any questions you have with your health care provider.     Document Released: 08/10/2005 Document Revised: 11/02/2011 Document Reviewed: 01/07/2011  Elsevier Interactive Patient Education 2016 Elsevier Inc.

## 2016-01-27 NOTE — ED Provider Notes (Signed)
CSN: [id number]     Arrival date & time 01/26/16  2151 History   First MD Initiated Contact with Patient 01/27/16 0114     Chief Complaint  Patient presents with  . Abdominal Pain     (Consider location/radiation/quality/duration/timing/severity/associated sxs/prior Treatment) Patient is a 30 y.o. female presenting with abdominal pain. The history is provided by the patient.  Abdominal Pain Pain location:  RLQ and periumbilical Pain quality: stabbing and throbbing   Pain radiation: radiates around right side. Pain severity:  Severe Onset quality:  Gradual Duration: intermittent and mild for months, but rapidly worsening and severe over the past 2-3 days, unbearable over the past 6 hours. Timing:  Constant Progression:  Worsening Chronicity:  Recurrent Context: not alcohol use (pt did drink a beer before coming to the ER to try and help pain, but no improvement), not diet changes, not eating, not medication withdrawal, not recent illness, not recent sexual activity, not sick contacts and not suspicious food intake  Prior surgeries: multiple previous c-sections.   Relieved by:  Nothing Worsened by:  Movement Ineffective treatments:  None tried Associated symptoms: chills, diarrhea, nausea and vomiting   Associated symptoms: no anorexia, no chest pain, no constipation, no cough, no dysuria, no fever, no hematemesis, no hematochezia, no hematuria, no melena, no shortness of breath and no sore throat   Diarrhea:    Quality:  Watery   Number of occurrences:  Cannot # Vomiting:    Emesis appearance: non-bloody, non-bilious.   Number of occurrences:  Cannot count the #   Severity:  Severe   Duration:  6 hours   Progression:  Worsening Patient is a 30 year old female with history of hypertension, anxiety, she presents emergency Department with complaints of 6 hours of severe abdominal pain that is located to the right of her umbilicus associated with a mass which is getting larger and more  painful. She has had severe nausea, vomiting and diarrhea throughout the day occurring simultaneously.  She denies hematemesis, melena, hematochezia.  She states that she had this right-sided abdominal pain for approximately a year but this is worse than normal.  She has a history of multiple C-sections, 3 of them were low transverse and 2 were vertical, no abdominal surgical hx.   She has no sick contacts, no suspicious food intake. She denies fever.  She states that she tried to drink a beer to help calm down her stomach.  3 days ago she used cocaine.     Past Medical History  Diagnosis Date  . Hypertension   . Anxiety    Past Surgical History  Procedure Laterality Date  . Artery repair    . Cesarean section      x 5  . Tubal ligation    . Mouth surgery    . Tear duct probing     Family History  Problem Relation Age of Onset  . CAD Other   . Diabetes Other   . Hypertension Other   . Cancer Other   . Thyroid disease Other    Social History  Substance Use Topics  . Smoking status: Current Every Day Smoker    Types: Cigarettes  . Smokeless tobacco: None  . Alcohol Use: Yes     Comment: occ   OB History    No data available     Review of Systems  Constitutional: Positive for chills. Negative for fever.  HENT: Negative for sore throat.   Respiratory: Negative for cough and shortness of breath.  Cardiovascular: Negative for chest pain.  Gastrointestinal: Positive for nausea, vomiting, abdominal pain and diarrhea. Negative for constipation, melena, hematochezia, anorexia and hematemesis.  Genitourinary: Negative for dysuria and hematuria.  All other systems reviewed and are negative.     Allergies  Acetaminophen; Codeine; Lisinopril; Penicillins; and Ativan  Home Medications   Prior to Admission medications   Medication Sig Start Date End Date Taking? Authorizing Provider  albuterol (PROVENTIL HFA;VENTOLIN HFA) 108 (90 BASE) MCG/ACT inhaler Inhale 2 puffs into the  lungs every 6 (six) hours as needed for wheezing or shortness of breath.    Historical Provider, MD  aspirin 325 MG EC tablet Take 325 mg by mouth daily as needed for pain.    Historical Provider, MD  ciprofloxacin (CIPRO) 500 MG tablet Take 1 tablet (500 mg total) by mouth 2 (two) times daily. 08/07/15   Shawn C Joy, PA-C  cloNIDine (CATAPRES) 0.1 MG tablet Take 1 tablet (0.1 mg total) by mouth 2 (two) times daily. 06/17/15   Ambrose Finland, NP  doxycycline (VIBRA-TABS) 100 MG tablet Take 1 tablet (100 mg total) by mouth 2 (two) times daily. 06/17/15   Ambrose Finland, NP  EPINEPHrine 0.3 mg/0.3 mL IJ SOAJ injection Inject 0.3 mLs (0.3 mg total) into the muscle once. 06/17/15   Ambrose Finland, NP  furosemide (LASIX) 20 MG tablet Take 1 tablet (20 mg total) by mouth daily. 06/19/15   Ambrose Finland, NP  hydrOXYzine (VISTARIL) 25 MG capsule Take 1 capsule (25 mg total) by mouth 3 (three) times daily as needed. 06/17/15   Ambrose Finland, NP  ibuprofen (ADVIL,MOTRIN) 200 MG tablet Take 800 mg by mouth every 6 (six) hours as needed for moderate pain.    Historical Provider, MD  ondansetron (ZOFRAN ODT) 4 MG disintegrating tablet Take 1 tablet (4 mg total) by mouth every 8 (eight) hours as needed for nausea or vomiting. 08/07/15   Shawn C Joy, PA-C  ondansetron (ZOFRAN ODT) 4 MG disintegrating tablet Take 1 tablet (4 mg total) by mouth every 8 (eight) hours as needed for nausea or vomiting. 01/27/16   Danelle Berry, PA-C  oxyCODONE-acetaminophen (PERCOCET/ROXICET) 5-325 MG tablet Take 2 tablets by mouth every 4 (four) hours as needed for severe pain. Take with food 08/07/15   Shawn C Joy, PA-C  propranolol (INDERAL) 40 MG tablet Take 1 tablet (40 mg total) by mouth 2 (two) times daily. 06/17/15   Ambrose Finland, NP  traMADol (ULTRAM) 50 MG tablet Take 1 tablet (50 mg total) by mouth every 6 (six) hours as needed. 01/27/16   Danelle Berry, PA-C   BP 152/97 mmHg  Pulse 47  Temp(Src) 98.3 F (36.8 C) (Oral)  Resp  16  Ht 5' (1.524 m)  Wt 61.236 kg  BMI 26.37 kg/m2  SpO2 96%  LMP 01/11/2016 Physical Exam  Constitutional: She is oriented to person, place, and time. She appears well-developed and well-nourished. No distress.  HENT:  Head: Normocephalic and atraumatic.  Nose: Nose normal.  Mouth/Throat: Oropharynx is clear and moist. No oropharyngeal exudate.  Eyes: Conjunctivae and EOM are normal. Pupils are equal, round, and reactive to light. Right eye exhibits no discharge. Left eye exhibits no discharge. No scleral icterus.  Neck: Normal range of motion. No JVD present. No tracheal deviation present. No thyromegaly present.  Cardiovascular: Normal rate, regular rhythm, normal heart sounds and intact distal pulses.  Exam reveals no gallop and no friction rub.   No murmur heard. Pulmonary/Chest: Effort normal  and breath sounds normal. No respiratory distress. She has no wheezes. She has no rales. She exhibits no tenderness.  Abdominal: Soft. Normal appearance and bowel sounds are normal. She exhibits mass. She exhibits no distension. There is generalized tenderness. There is guarding. There is no rigidity, no rebound and no CVA tenderness. A hernia is present. Hernia confirmed positive in the ventral area.    Musculoskeletal: Normal range of motion. She exhibits no edema or tenderness.  Lymphadenopathy:    She has no cervical adenopathy.  Neurological: She is alert and oriented to person, place, and time. She has normal reflexes. No cranial nerve deficit. She exhibits normal muscle tone. Coordination normal.  Skin: Skin is warm and dry. No rash noted. She is not diaphoretic. No erythema. No pallor.  Psychiatric: Judgment and thought content normal. Her mood appears anxious. Her affect is labile. Her speech is rapid and/or pressured. She is aggressive. Cognition and memory are normal.  Nursing note and vitals reviewed.   ED Course  Procedures (including critical care time) Labs Review Labs  Reviewed  LIPASE, BLOOD - Abnormal; Notable for the following:    Lipase 84 (*)    All other components within normal limits  COMPREHENSIVE METABOLIC PANEL - Abnormal; Notable for the following:    Glucose, Bld 120 (*)    BUN <5 (*)    AST 61 (*)    All other components within normal limits  CBC - Abnormal; Notable for the following:    MCV 104.7 (*)    MCH 35.5 (*)    All other components within normal limits  URINALYSIS, ROUTINE W REFLEX MICROSCOPIC (NOT AT [facility]) - Abnormal; Notable for the following:    APPearance CLOUDY (*)    All other components within normal limits  URINE RAPID DRUG SCREEN, HOSP PERFORMED - Abnormal; Notable for the following:    Cocaine POSITIVE (*)    All other components within normal limits  I-STAT BETA HCG BLOOD, ED ([facility], [facility], [facility] ONLY)    Imaging Review Ct Abdomen Pelvis W Contrast  01/27/2016  CLINICAL DATA:  Right periumbilical pain with bloating and vomiting. EXAM: CT ABDOMEN AND PELVIS WITH CONTRAST TECHNIQUE: Multidetector CT imaging of the abdomen and pelvis was performed using the standard protocol following bolus administration of intravenous contrast. CONTRAST:  ISOVUE-300 IOPAMIDOL (ISOVUE-300) INJECTION 61% COMPARISON:  08/07/2015 FINDINGS: Lower chest and abdominal wall: Chronic right of midline periumbilical hernia containing fat. Hernia size has increased and the herniated fat is more stranded. The hernia sac measures up to 4 cm. Hepatobiliary: Patchy hepatic steatosis. Presumed sub cm cyst in the ventral central liver.No evidence of biliary obstruction or stone. Pancreas: Unremarkable. Spleen: Unremarkable. Adrenals/Urinary Tract: Negative adrenals. No hydronephrosis or stone. Unremarkable bladder. Reproductive:36 mm cyst in the right ovary which has probable layering hematocrit level. There is a neighboring corpus luteum. Unremarkable left ovary. Stomach/Bowel:  No obstruction. No appendicitis. Vascular/Lymphatic: No acute vascular abnormality.  No mass or adenopathy. Peritoneal: No ascites or pneumoperitoneum. Musculoskeletal: No acute abnormalities. IMPRESSION: 1. Fatty periumbilical hernia. The hernia is chronic but there has been interval increase in size and fat edema. 2. 36 mm right ovarian cyst with probable layering hematocrit level. 3. Hepatic steatosis. Electronically Signed   By: Marnee Spring M.D.   On: 01/27/2016 02:57   I have personally reviewed and evaluated these images and lab results as part of my medical decision-making.   EKG Interpretation None      MDM   Patient with) umbilical  abdominal pain with a mass that is enlarged, also severe nausea, vomiting and diarrhea today.  She admits to EtOH use and cocaine use.  On exam abdominal pain is generalized but more tender around small right periumbilical mass, likely ventral hernia.  Workup pertinent for elevated lipase, elevated AST. Patient was tachycardic on the ER, was extremely anxious and had not taken her propranolol or clonidine today.  Patient was given fluids, antiemetics, pain medication, no active emesis in the ER. She was able to tolerate PO's after Ct resulted, negative for acute or surgical abdomen.  Marland Kitchen Umbilical hernia containing, appears chronic with interval increase in size when compared to prior scan from December 2016.  Skin also instantly reveals a right ovarian cyst and hepatic steatosis.  The patient was discharged home with general surgery referral for antiemetics and pain medication.  Suspect N,V,D is unrelated to periumbilical hernia, and may be related to ETOH/sustance abuse or GI illness.  She was discharged in good condition.   Final diagnoses:  Umbilical hernia without obstruction and without gangrene  Right ovarian cyst  Nausea vomiting and diarrhea       Danelle Berry, PA-C 01/27/16 1610  Tomasita Crumble, MD 01/27/16 [phone]

## 2016-01-27 NOTE — ED Notes (Signed)
Patient left at this time with all belongings. 

## 2016-01-27 NOTE — ED Notes (Signed)
Pt states pain 8/10, however requesting meal. Meal given, ice applied to hernia.

## 2016-01-27 NOTE — ED Notes (Signed)
At time of discharge, pt given RX for tramadol and dose p/t leaving. Pt states, "tramadol? Isn't that weak for this kind of pain?" Pt educated about narcotics in ED. See UDS.

## 2016-01-27 NOTE — ED Notes (Signed)
Pa notified of allergy to ativan, charted updated to reflect same.

## 2016-02-24 ENCOUNTER — Encounter (HOSPITAL_COMMUNITY): Payer: Self-pay | Admitting: Family Medicine

## 2016-02-24 ENCOUNTER — Emergency Department (HOSPITAL_COMMUNITY)
Admission: EM | Admit: 2016-02-24 | Discharge: 2016-02-24 | Disposition: A | Discharge status: Home / Self Care | Payer: Self-pay | Attending: Emergency Medicine | Admitting: Emergency Medicine

## 2016-02-24 DIAGNOSIS — I10 Essential (primary) hypertension: Secondary | ICD-10-CM | POA: Insufficient documentation

## 2016-02-24 DIAGNOSIS — Z79899 Other long term (current) drug therapy: Secondary | ICD-10-CM | POA: Insufficient documentation

## 2016-02-24 DIAGNOSIS — F17210 Nicotine dependence, cigarettes, uncomplicated: Secondary | ICD-10-CM | POA: Insufficient documentation

## 2016-02-24 DIAGNOSIS — R1010 Upper abdominal pain, unspecified: Secondary | ICD-10-CM | POA: Insufficient documentation

## 2016-02-24 DIAGNOSIS — R109 Unspecified abdominal pain: Secondary | ICD-10-CM

## 2016-02-24 LAB — URINALYSIS, ROUTINE W REFLEX MICROSCOPIC
Bilirubin Urine: NEGATIVE
Glucose, UA: NEGATIVE mg/dL
Ketones, ur: NEGATIVE mg/dL
NITRITE: NEGATIVE
PROTEIN: NEGATIVE mg/dL
Specific Gravity, Urine: 1.028 (ref 1.005–1.030)
pH: 6 (ref 5.0–8.0)

## 2016-02-24 LAB — WET PREP, GENITAL
Sperm: NONE SEEN
TRICH WET PREP: NONE SEEN
Yeast Wet Prep HPF POC: NONE SEEN

## 2016-02-24 LAB — CBC
HCT: 41.3 % (ref 36.0–46.0)
HEMOGLOBIN: 13.9 g/dL (ref 12.0–15.0)
MCH: 35.3 pg — ABNORMAL HIGH (ref 26.0–34.0)
MCHC: 33.7 g/dL (ref 30.0–36.0)
MCV: 104.8 fL — ABNORMAL HIGH (ref 78.0–100.0)
Platelets: 184 10*3/uL (ref 150–400)
RBC: 3.94 MIL/uL (ref 3.87–5.11)
RDW: 12.2 % (ref 11.5–15.5)
WBC: 8.8 10*3/uL (ref 4.0–10.5)

## 2016-02-24 LAB — I-STAT BETA HCG BLOOD, ED (MC, WL, AP ONLY)

## 2016-02-24 LAB — COMPREHENSIVE METABOLIC PANEL
ALK PHOS: 109 U/L (ref 38–126)
ALT: 21 U/L (ref 14–54)
ANION GAP: 8 (ref 5–15)
AST: 29 U/L (ref 15–41)
Albumin: 3.2 g/dL — ABNORMAL LOW (ref 3.5–5.0)
BILIRUBIN TOTAL: 0.5 mg/dL (ref 0.3–1.2)
BUN: <5 mg/dL — ABNORMAL LOW (ref 6–20)
CALCIUM: 10.2 mg/dL (ref 8.9–10.3)
CO2: 24 mmol/L (ref 22–32)
Chloride: 102 mmol/L (ref 101–111)
Creatinine, Ser: 0.56 mg/dL (ref 0.44–1.00)
Glucose, Bld: 100 mg/dL — ABNORMAL HIGH (ref 65–99)
Potassium: 4.2 mmol/L (ref 3.5–5.1)
SODIUM: 134 mmol/L — AB (ref 135–145)
TOTAL PROTEIN: 6.7 g/dL (ref 6.5–8.1)

## 2016-02-24 LAB — URINE MICROSCOPIC-ADD ON: RBC / HPF: NONE SEEN RBC/hpf (ref 0–5)

## 2016-02-24 LAB — RAPID URINE DRUG SCREEN, HOSP PERFORMED
Amphetamines: NOT DETECTED
BARBITURATES: NOT DETECTED
Benzodiazepines: POSITIVE — AB
COCAINE: POSITIVE — AB
OPIATES: NOT DETECTED
Tetrahydrocannabinol: NOT DETECTED

## 2016-02-24 LAB — LIPASE, BLOOD: Lipase: 45 U/L (ref 11–51)

## 2016-02-24 LAB — ETHANOL: Alcohol, Ethyl (B): <5 mg/dL (ref <5)

## 2016-02-24 MED ORDER — MORPHINE SULFATE (PF) 4 MG/ML IV SOLN
4.0000 mg | Freq: Once | INTRAVENOUS | Status: AC
  Administered 2016-02-24: 4 mg via INTRAVENOUS
  Filled 2016-02-24: qty 1

## 2016-02-24 MED ORDER — FAMOTIDINE 20 MG PO TABS: 20.0000 mg | ORAL_TABLET | Freq: Two times a day (BID) | ORAL | Status: DC

## 2016-02-24 MED ORDER — SODIUM CHLORIDE 0.9 % IV BOLUS (SEPSIS)
1000.0000 mL | Freq: Once | INTRAVENOUS | Status: AC
  Administered 2016-02-24: 1000 mL via INTRAVENOUS

## 2016-02-24 MED ORDER — OMEPRAZOLE 20 MG PO CPDR: 20.0000 mg | DELAYED_RELEASE_CAPSULE | Freq: Every day | ORAL | Status: DC

## 2016-02-24 MED ORDER — ONDANSETRON HCL 4 MG/2ML IJ SOLN
4.0000 mg | Freq: Once | INTRAMUSCULAR | Status: AC
  Administered 2016-02-24: 4 mg via INTRAVENOUS
  Filled 2016-02-24: qty 2

## 2016-02-24 MED ORDER — GI COCKTAIL ~~LOC~~
30.0000 mL | Freq: Once | ORAL | Status: AC
  Administered 2016-02-24: 30 mL via ORAL
  Filled 2016-02-24: qty 30

## 2016-02-24 NOTE — ED Notes (Signed)
Gave pt saltine crackers per Erin-RN.

## 2016-02-24 NOTE — ED Provider Notes (Signed)
CSN: [id number]     Arrival date & time 02/24/16  0813 History   First MD Initiated Contact with Patient 02/24/16 0825     Chief Complaint  Patient presents with  . Abdominal Pain     (Consider location/radiation/quality/duration/timing/severity/associated sxs/prior Treatment) HPI [name] is a 30 y.o. female with history of hypertension and anxiety, presents to emergency department complaining of abdominal pain. Patient states that she has developed new pain 2 days ago. Pain is mainly in the upper abdomen, but states that she is "down into the pelvic area." She reports associated nausea. Reports some vomiting. Denies constipation, reports diarrhea. No blood in her stool or emesis. No fever or chills. Reports some vaginal discharge. Last menstrual cycle was one month ago. Reports heavy alcohol use, states drinks every day. Also has history of cocaine abuse. Has not tried taking anything for this pain. Patient also has known umbilical hernia for which she has not seen anybody.  Past Medical History  Diagnosis Date  . Hypertension   . Anxiety    Past Surgical History  Procedure Laterality Date  . Artery repair    . Cesarean section      x 5  . Tubal ligation    . Mouth surgery    . Tear duct probing     Family History  Problem Relation Age of Onset  . CAD Other   . Diabetes Other   . Hypertension Other   . Cancer Other   . Thyroid disease Other    Social History  Substance Use Topics  . Smoking status: Current Every Day Smoker    Types: Cigarettes  . Smokeless tobacco: None  . Alcohol Use: Yes     Comment: 3 days a week   OB History    No data available     Review of Systems  Constitutional: Negative for fever and chills.  Respiratory: Negative for cough, chest tightness and shortness of breath.   Cardiovascular: Negative for chest pain, palpitations and leg swelling.  Gastrointestinal: Positive for nausea, vomiting, abdominal pain and diarrhea.   Genitourinary: Positive for vaginal discharge. Negative for dysuria, flank pain, vaginal bleeding, vaginal pain and pelvic pain.  Musculoskeletal: Negative for myalgias, arthralgias, neck pain and neck stiffness.  Skin: Negative for rash.  Neurological: Negative for dizziness, weakness and headaches.  All other systems reviewed and are negative.     Allergies  Ibuprofen; Acetaminophen; Codeine; Lisinopril; Penicillins; and Ativan  Home Medications   Prior to Admission medications   Medication Sig Start Date End Date Taking? Authorizing Provider  albuterol (PROVENTIL HFA;VENTOLIN HFA) 108 (90 BASE) MCG/ACT inhaler Inhale 2 puffs into the lungs every 6 (six) hours as needed for wheezing or shortness of breath.   Yes Historical Provider, MD  cloNIDine (CATAPRES) 0.1 MG tablet Take 1 tablet (0.1 mg total) by mouth 2 (two) times daily. 06/17/15  Yes Ambrose Finland, NP  EPINEPHrine 0.3 mg/0.3 mL IJ SOAJ injection Inject 0.3 mLs (0.3 mg total) into the muscle once. 06/17/15  Yes Ambrose Finland, NP  propranolol (INDERAL) 40 MG tablet Take 1 tablet (40 mg total) by mouth 2 (two) times daily. Patient taking differently: Take 20 mg by mouth 2 (two) times daily.  06/17/15  Yes Ambrose Finland, NP  hydrOXYzine (VISTARIL) 25 MG capsule Take 1 capsule (25 mg total) by mouth 3 (three) times daily as needed. Patient not taking: Reported on 02/24/2016 06/17/15   Ambrose Finland, NP   BP 131/99  mmHg  Pulse 85  Temp(Src) 98.7 F (37.1 C) (Oral)  Resp 21  SpO2 99%  LMP 01/31/2016 Physical Exam  Constitutional: She is oriented to person, place, and time. She appears well-developed and well-nourished. No distress.  HENT:  Head: Normocephalic.  Eyes: Conjunctivae are normal.  Neck: Neck supple.  Cardiovascular: Normal rate, regular rhythm and normal heart sounds.   Pulmonary/Chest: Effort normal and breath sounds normal. No respiratory distress. She has no wheezes. She has no rales.  Abdominal: Soft.  Bowel sounds are normal. She exhibits no distension. There is no tenderness. There is no rebound.  Diffuse tenderness, worse in the right upper quadrant, epigastric, left upper quadrant areas. No tenderness at McBurney's point. Small periumbilical hernia, soft, reducible.  Genitourinary:  Normal external genitalia. Normal vaginal canal. Small bloody discharge. Cervix is normal, closed. No CMT. No uterine or adnexal tenderness. No masses palpated.    Musculoskeletal: She exhibits no edema.  Neurological: She is alert and oriented to person, place, and time.  Skin: Skin is warm and dry.  Psychiatric: She has a normal mood and affect. Her behavior is normal.  Nursing note and vitals reviewed.   ED Course  Procedures (including critical care time) Labs Review Labs Reviewed  COMPREHENSIVE METABOLIC PANEL - Abnormal; Notable for the following:    Sodium 134 (*)    Glucose, Bld 100 (*)    BUN <5 (*)    Albumin 3.2 (*)    All other components within normal limits  CBC - Abnormal; Notable for the following:    MCV 104.8 (*)    MCH 35.3 (*)    All other components within normal limits  URINALYSIS, ROUTINE W REFLEX MICROSCOPIC (NOT AT [facility]) - Abnormal; Notable for the following:    Color, Urine AMBER (*)    APPearance CLOUDY (*)    Hgb urine dipstick MODERATE (*)    Leukocytes, UA TRACE (*)    All other components within normal limits  URINE RAPID DRUG SCREEN, HOSP PERFORMED - Abnormal; Notable for the following:    Cocaine POSITIVE (*)    Benzodiazepines POSITIVE (*)    All other components within normal limits  URINE MICROSCOPIC-ADD ON - Abnormal; Notable for the following:    Squamous Epithelial / LPF 6-30 (*)    Bacteria, UA MANY (*)    All other components within normal limits  WET PREP, GENITAL  LIPASE, BLOOD  ETHANOL  I-STAT BETA HCG BLOOD, ED ([facility], [facility], [facility] ONLY)  GC/CHLAMYDIA PROBE AMP ([place]) NOT AT [facility]    Imaging Review No results found. I have personally reviewed  and evaluated these images and lab results as part of my medical decision-making.   EKG Interpretation None      MDM   Final diagnoses:  Abdominal pain, unspecified abdominal location   Patient to the emergency department with upper abdominal pain that radiates diffusely. She does have a small periumbilical hernia that is soft. We will check labs, urinalysis, perform pelvic exam. Morphine ordered for pain.  11:18 AM The patient's pain improved. Pelvic exam unremarkable, many white blood cells on wet prep, however no cervical motion tenderness or adnexal tenderness. Will hold off on treating. Patient's pain is mainly epigastric. I question if this could be due to her heavy alcohol use, which resulted in gastritis or even peptic ulcer disease. We discussed with her diet changes. I will start her on Pepcid and Prilosec. Discussed stopping drinking. Will have her follow-up with her primary care doctor.  Filed Vitals:   02/24/16 0822 02/24/16 0900 02/24/16 1134  BP: 131/99 133/90 116/79  Pulse: 85 69 70  Temp: 98.7 F (37.1 C)  98.1 F (36.7 C)  TempSrc: Oral  Oral  Resp: 21 15 20   SpO2: 99% 98% 95%     Jaynie Crumble, PA-C 02/24/16 1623  Mancel Bale, MD 02/24/16 1723

## 2016-02-24 NOTE — ED Notes (Signed)
Pt. Given food by EDP.

## 2016-02-24 NOTE — Discharge Instructions (Signed)
Take pepcid as prescribed until all gone. Take prilosec as prescribed daily. Stop drinking alcohol. Avoid any spicy foods. Follow up with a primary care doctor.    Abdominal Pain, Adult Many things can cause abdominal pain. Usually, abdominal pain is not caused by a disease and will improve without treatment. It can often be observed and treated at home. Your health care provider will do a physical exam and possibly order blood tests and X-rays to help determine the seriousness of your pain. However, in many cases, more time must pass before a clear cause of the pain can be found. Before that point, your health care provider may not know if you need more testing or further treatment. HOME CARE INSTRUCTIONS Monitor your abdominal pain for any changes. The following actions may help to alleviate any discomfort you are experiencing:  Only take over-the-counter or prescription medicines as directed by your health care provider.  Do not take laxatives unless directed to do so by your health care provider.  Try a clear liquid diet (broth, tea, or water) as directed by your health care provider. Slowly move to a bland diet as tolerated. SEEK MEDICAL CARE IF:  You have unexplained abdominal pain.  You have abdominal pain associated with nausea or diarrhea.  You have pain when you urinate or have a bowel movement.  You experience abdominal pain that wakes you in the night.  You have abdominal pain that is worsened or improved by eating food.  You have abdominal pain that is worsened with eating fatty foods.  You have a fever. SEEK IMMEDIATE MEDICAL CARE IF:  Your pain does not go away within 2 hours.  You keep throwing up (vomiting).  Your pain is felt only in portions of the abdomen, such as the right side or the left lower portion of the abdomen.  You pass bloody or black tarry stools. MAKE SURE YOU:  Understand these instructions.  Will watch your condition.  Will get help right  away if you are not doing well or get worse.   This information is not intended to replace advice given to you by your health care provider. Make sure you discuss any questions you have with your health care provider.   Document Released: 05/20/2005 Document Revised: 05/01/2015 Document Reviewed: 04/19/2013 Elsevier Interactive Patient Education 2016 [employer]  Gastritis, Adult Gastritis is soreness and swelling (inflammation) of the lining of the stomach. Gastritis can develop as a sudden onset (acute) or long-term (chronic) condition. If gastritis is not treated, it can lead to stomach bleeding and ulcers. CAUSES  Gastritis occurs when the stomach lining is weak or damaged. Digestive juices from the stomach then inflame the weakened stomach lining. The stomach lining may be weak or damaged due to viral or bacterial infections. One common bacterial infection is the Helicobacter pylori infection. Gastritis can also result from excessive alcohol consumption, taking certain medicines, or having too much acid in the stomach.  SYMPTOMS  In some cases, there are no symptoms. When symptoms are present, they may include:  Pain or a burning sensation in the upper abdomen.  Nausea.  Vomiting.  An uncomfortable feeling of fullness after eating. DIAGNOSIS  Your caregiver may suspect you have gastritis based on your symptoms and a physical exam. To determine the cause of your gastritis, your caregiver may perform the following:  Blood or stool tests to check for the H pylori bacterium.  Gastroscopy. A thin, flexible tube (endoscope) is passed down the esophagus and into  the stomach. The endoscope has a light and camera on the end. Your caregiver uses the endoscope to view the inside of the stomach.  Taking a tissue sample (biopsy) from the stomach to examine under a microscope. TREATMENT  Depending on the cause of your gastritis, medicines may be prescribed. If you have a bacterial infection,  such as an H pylori infection, antibiotics may be given. If your gastritis is caused by too much acid in the stomach, H2 blockers or antacids may be given. Your caregiver may recommend that you stop taking aspirin, ibuprofen, or other nonsteroidal anti-inflammatory drugs (NSAIDs). HOME CARE INSTRUCTIONS  Only take over-the-counter or prescription medicines as directed by your caregiver.  If you were given antibiotic medicines, take them as directed. Finish them even if you start to feel better.  Drink enough fluids to keep your urine clear or pale yellow.  Avoid foods and drinks that make your symptoms worse, such as:  Caffeine or alcoholic drinks.  Chocolate.  Peppermint or mint flavorings.  Garlic and onions.  Spicy foods.  Citrus fruits, such as oranges, lemons, or limes.  Tomato-based foods such as sauce, chili, salsa, and pizza.  Fried and fatty foods.  Eat small, frequent meals instead of large meals. SEEK IMMEDIATE MEDICAL CARE IF:   You have black or dark red stools.  You vomit blood or material that looks like coffee grounds.  You are unable to keep fluids down.  Your abdominal pain gets worse.  You have a fever.  You do not feel better after 1 week.  You have any other questions or concerns. MAKE SURE YOU:  Understand these instructions.  Will watch your condition.  Will get help right away if you are not doing well or get worse.   This information is not intended to replace advice given to you by your health care provider. Make sure you discuss any questions you have with your health care provider.   Document Released: 08/04/2001 Document Revised: 02/09/2012 Document Reviewed: 09/23/2011 Elsevier Interactive Patient Education [employer]

## 2016-02-24 NOTE — ED Notes (Signed)
Pt here for abd pain, N,V,D for a few days. sts pain in her upper abdomen and radiating into lower and more on the right. sts hurts when she breathes and moves.

## 2016-02-26 LAB — GC/CHLAMYDIA PROBE AMP (~~LOC~~) NOT AT ARMC
CHLAMYDIA, DNA PROBE: NEGATIVE
Neisseria Gonorrhea: NEGATIVE

## 2016-05-08 ENCOUNTER — Emergency Department (HOSPITAL_COMMUNITY): Payer: Self-pay

## 2016-05-08 ENCOUNTER — Encounter (HOSPITAL_COMMUNITY): Payer: Self-pay

## 2016-05-08 ENCOUNTER — Emergency Department (HOSPITAL_COMMUNITY)
Admission: EM | Admit: 2016-05-08 | Discharge: 2016-05-08 | Disposition: A | Discharge status: Home / Self Care | Payer: Self-pay | Attending: Emergency Medicine | Admitting: Emergency Medicine

## 2016-05-08 DIAGNOSIS — Y929 Unspecified place or not applicable: Secondary | ICD-10-CM | POA: Insufficient documentation

## 2016-05-08 DIAGNOSIS — F17210 Nicotine dependence, cigarettes, uncomplicated: Secondary | ICD-10-CM | POA: Insufficient documentation

## 2016-05-08 DIAGNOSIS — S0512XA Contusion of eyeball and orbital tissues, left eye, initial encounter: Secondary | ICD-10-CM

## 2016-05-08 DIAGNOSIS — Y999 Unspecified external cause status: Secondary | ICD-10-CM | POA: Insufficient documentation

## 2016-05-08 DIAGNOSIS — S0181XA Laceration without foreign body of other part of head, initial encounter: Secondary | ICD-10-CM | POA: Insufficient documentation

## 2016-05-08 DIAGNOSIS — I10 Essential (primary) hypertension: Secondary | ICD-10-CM | POA: Insufficient documentation

## 2016-05-08 DIAGNOSIS — Y939 Activity, unspecified: Secondary | ICD-10-CM | POA: Insufficient documentation

## 2016-05-08 DIAGNOSIS — Z23 Encounter for immunization: Secondary | ICD-10-CM | POA: Insufficient documentation

## 2016-05-08 HISTORY — DX: Unspecified cirrhosis of liver: K74.60

## 2016-05-08 HISTORY — DX: Disorder of kidney and ureter, unspecified: N28.9

## 2016-05-08 LAB — BASIC METABOLIC PANEL
Anion gap: 9 (ref 5–15)
BUN: 8 mg/dL (ref 6–20)
CALCIUM: 9.5 mg/dL (ref 8.9–10.3)
CO2: 24 mmol/L (ref 22–32)
Chloride: 105 mmol/L (ref 101–111)
Creatinine, Ser: 0.59 mg/dL (ref 0.44–1.00)
GFR calc Af Amer: >60 mL/min (ref >60)
GLUCOSE: 114 mg/dL — AB (ref 65–99)
Potassium: 3.3 mmol/L — ABNORMAL LOW (ref 3.5–5.1)
Sodium: 138 mmol/L (ref 135–145)

## 2016-05-08 LAB — I-STAT BETA HCG BLOOD, ED (MC, WL, AP ONLY): I-stat hCG, quantitative: <5 m[IU]/mL (ref <5)

## 2016-05-08 LAB — CBC WITH DIFFERENTIAL/PLATELET
Basophils Absolute: 0 10*3/uL (ref 0.0–0.1)
Basophils Relative: 0 %
EOS PCT: 2 %
Eosinophils Absolute: 0.1 10*3/uL (ref 0.0–0.7)
HEMATOCRIT: 38 % (ref 36.0–46.0)
Hemoglobin: 12.3 g/dL (ref 12.0–15.0)
LYMPHS ABS: 1.1 10*3/uL (ref 0.7–4.0)
LYMPHS PCT: 21 %
MCH: 32.5 pg (ref 26.0–34.0)
MCHC: 32.4 g/dL (ref 30.0–36.0)
MCV: 100.3 fL — AB (ref 78.0–100.0)
MONO ABS: 0.8 10*3/uL (ref 0.1–1.0)
MONOS PCT: 16 %
Neutro Abs: 3.2 10*3/uL (ref 1.7–7.7)
Neutrophils Relative %: 61 %
PLATELETS: 168 10*3/uL (ref 150–400)
RBC: 3.79 MIL/uL — ABNORMAL LOW (ref 3.87–5.11)
RDW: 13.8 % (ref 11.5–15.5)
WBC: 5.3 10*3/uL (ref 4.0–10.5)

## 2016-05-08 MED ORDER — HYDROXYZINE HCL 25 MG PO TABS: 25.0000 mg | ORAL_TABLET | Freq: Four times a day (QID) | ORAL | 0 refills | Status: DC | PRN

## 2016-05-08 MED ORDER — HYDROCODONE-ACETAMINOPHEN 5-325 MG PO TABS
1.0000 | ORAL_TABLET | Freq: Once | ORAL | Status: AC
  Administered 2016-05-08: 1 via ORAL
  Filled 2016-05-08: qty 1

## 2016-05-08 MED ORDER — ONDANSETRON 4 MG PO TBDP
4.0000 mg | ORAL_TABLET | Freq: Once | ORAL | Status: AC
  Administered 2016-05-08: 4 mg via ORAL
  Filled 2016-05-08: qty 1

## 2016-05-08 MED ORDER — HYDROCODONE-ACETAMINOPHEN 5-325 MG PO TABS: ORAL_TABLET | ORAL | 0 refills | Status: DC

## 2016-05-08 MED ORDER — PROMETHAZINE HCL 25 MG PO TABS: 25.0000 mg | ORAL_TABLET | Freq: Four times a day (QID) | ORAL | 0 refills | Status: DC | PRN

## 2016-05-08 MED ORDER — LORAZEPAM 2 MG/ML IJ SOLN
1.0000 mg | Freq: Once | INTRAMUSCULAR | Status: AC
  Administered 2016-05-08: 1 mg via INTRAVENOUS
  Filled 2016-05-08: qty 1

## 2016-05-08 MED ORDER — TETANUS-DIPHTH-ACELL PERTUSSIS 5-2.5-18.5 LF-MCG/0.5 IM SUSP
0.5000 mL | Freq: Once | INTRAMUSCULAR | Status: AC
  Administered 2016-05-08: 0.5 mL via INTRAMUSCULAR
  Filled 2016-05-08: qty 0.5

## 2016-05-08 NOTE — Discharge Instructions (Signed)
Rest, Ice intermittently (in the first 24-48 hours), Gentle compression with an Ace wrap, and elevate (Limb above the level of the heart)   Take up to 800mg  of ibuprofen (that is usually 4 over the counter pills)  3 times a day for 5 days. Take with food.  Take vicodin for breakthrough pain, do not drink alcohol, drive, care for children or do other critical tasks while taking vicodin.  Do not hesitate to return to the emergency room for any new, worsening or concerning symptoms.  Please obtain primary care using resource guide below. Let them know that you were seen in the emergency room and that they will need to obtain records for further outpatient management.
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
## 2016-05-08 NOTE — ED Notes (Signed)
Gave pt ice pack, per Britta Mccreedy - RN.

## 2016-05-21 ENCOUNTER — Telehealth: Payer: Self-pay | Admitting: Internal Medicine

## 2016-05-21 DIAGNOSIS — I10 Essential (primary) hypertension: Secondary | ICD-10-CM

## 2016-05-21 MED ORDER — PROPRANOLOL HCL 40 MG PO TABS: 40.0000 mg | ORAL_TABLET | Freq: Two times a day (BID) | ORAL | 0 refills | Status: DC

## 2016-05-21 MED ORDER — CLONIDINE HCL 0.1 MG PO TABS: 0.1000 mg | ORAL_TABLET | Freq: Two times a day (BID) | ORAL | 0 refills | Status: DC

## 2016-05-21 NOTE — Telephone Encounter (Signed)
Patient called the office to schedule an appt and request medication refill for bp medication. Please follow up.  Thank you

## 2016-05-21 NOTE — Telephone Encounter (Signed)
Refill blood pressure medications - patient must have office visit for further refills.

## 2016-05-26 MED FILL — cloNIDine HCL 0.1 MG TABS: 0.1 | 30 days supply | Qty: 60 | Fill #0

## 2016-06-03 ENCOUNTER — Ambulatory Visit: Payer: Self-pay | Admitting: Internal Medicine

## 2016-08-03 ENCOUNTER — Other Ambulatory Visit: Payer: Self-pay | Admitting: Internal Medicine

## 2016-08-03 DIAGNOSIS — I10 Essential (primary) hypertension: Secondary | ICD-10-CM

## 2016-08-03 MED FILL — PROPRANOLOL 40 MG TABLET: 40 | 30 days supply | Qty: 60 | Fill #0

## 2016-08-04 MED FILL — cloNIDine HCL 0.1 MG TABS: 0.1 | 30 days supply | Qty: 60 | Fill #0

## 2016-08-21 ENCOUNTER — Ambulatory Visit: Payer: Self-pay | Admitting: Family Medicine

## 2016-09-23 ENCOUNTER — Encounter: Payer: Self-pay | Admitting: Family Medicine

## 2016-09-23 ENCOUNTER — Ambulatory Visit: Payer: Self-pay | Attending: Family Medicine | Admitting: Family Medicine

## 2016-09-23 VITALS — BP 124/90 | HR 77 | Temp 98.5°F | Resp 18 | Ht 60.0 in | Wt 146.0 lb

## 2016-09-23 DIAGNOSIS — K429 Umbilical hernia without obstruction or gangrene: Secondary | ICD-10-CM | POA: Insufficient documentation

## 2016-09-23 DIAGNOSIS — I10 Essential (primary) hypertension: Secondary | ICD-10-CM | POA: Insufficient documentation

## 2016-09-23 DIAGNOSIS — R1011 Right upper quadrant pain: Secondary | ICD-10-CM | POA: Insufficient documentation

## 2016-09-23 DIAGNOSIS — F411 Generalized anxiety disorder: Secondary | ICD-10-CM | POA: Insufficient documentation

## 2016-09-23 DIAGNOSIS — K219 Gastro-esophageal reflux disease without esophagitis: Secondary | ICD-10-CM | POA: Insufficient documentation

## 2016-09-23 DIAGNOSIS — Z23 Encounter for immunization: Secondary | ICD-10-CM

## 2016-09-23 DIAGNOSIS — R112 Nausea with vomiting, unspecified: Secondary | ICD-10-CM | POA: Insufficient documentation

## 2016-09-23 LAB — HEPATIC FUNCTION PANEL
ALBUMIN: 3.9 g/dL (ref 3.6–5.1)
ALT: 43 U/L — ABNORMAL HIGH (ref 6–29)
AST: 52 U/L — ABNORMAL HIGH (ref 10–30)
Alkaline Phosphatase: 87 U/L (ref 33–115)
BILIRUBIN TOTAL: 0.4 mg/dL (ref 0.2–1.2)
Bilirubin, Direct: 0.1 mg/dL (ref <=0.2)
Indirect Bilirubin: 0.3 mg/dL (ref 0.2–1.2)
TOTAL PROTEIN: 6.8 g/dL (ref 6.1–8.1)

## 2016-09-23 LAB — BASIC METABOLIC PANEL WITH GFR
BUN: 9 mg/dL (ref 7–25)
CHLORIDE: 105 mmol/L (ref 98–110)
CO2: 26 mmol/L (ref 20–31)
CREATININE: 0.69 mg/dL (ref 0.50–1.10)
Calcium: 9.2 mg/dL (ref 8.6–10.2)
GFR, Est African American: >89 mL/min (ref >=60)
GFR, Est Non African American: >89 mL/min (ref >=60)
Glucose, Bld: 76 mg/dL (ref 65–99)
POTASSIUM: 4.2 mmol/L (ref 3.5–5.3)
SODIUM: 140 mmol/L (ref 135–146)

## 2016-09-23 LAB — CBC WITH DIFFERENTIAL/PLATELET
BASOS PCT: 1 %
Basophils Absolute: 67 cells/uL (ref 0–200)
EOS ABS: 201 {cells}/uL (ref 15–500)
Eosinophils Relative: 3 %
HEMATOCRIT: 42.4 % (ref 35.0–45.0)
HEMOGLOBIN: 14.3 g/dL (ref 11.7–15.5)
LYMPHS ABS: 1675 {cells}/uL (ref 850–3900)
LYMPHS PCT: 25 %
MCH: 32.9 pg (ref 27.0–33.0)
MCHC: 33.7 g/dL (ref 32.0–36.0)
MCV: 97.7 fL (ref 80.0–100.0)
MONO ABS: 603 {cells}/uL (ref 200–950)
MPV: 8.8 fL (ref 7.5–12.5)
Monocytes Relative: 9 %
Neutro Abs: 4154 cells/uL (ref 1500–7800)
Neutrophils Relative %: 62 %
Platelets: 268 10*3/uL (ref 140–400)
RBC: 4.34 MIL/uL (ref 3.80–5.10)
RDW: 14.5 % (ref 11.0–15.0)
WBC: 6.7 10*3/uL (ref 3.8–10.8)

## 2016-09-23 LAB — LIPID PANEL
CHOL/HDL RATIO: 2.1 ratio (ref <5.0)
Cholesterol: 184 mg/dL (ref <200)
HDL: 86 mg/dL (ref >50)
LDL CALC: 81 mg/dL (ref <100)
TRIGLYCERIDES: 87 mg/dL (ref <150)
VLDL: 17 mg/dL (ref <30)

## 2016-09-23 MED ORDER — METOCLOPRAMIDE HCL 10 MG PO TABS: 10.0000 mg | ORAL_TABLET | Freq: Four times a day (QID) | ORAL | 0 refills | Status: DC | PRN

## 2016-09-23 MED ORDER — PROPRANOLOL HCL 40 MG PO TABS: 40.0000 mg | ORAL_TABLET | Freq: Two times a day (BID) | ORAL | 2 refills | Status: DC

## 2016-09-23 MED ORDER — RANITIDINE HCL 150 MG PO TABS: 150.0000 mg | ORAL_TABLET | Freq: Two times a day (BID) | ORAL | 2 refills | Status: DC

## 2016-09-23 MED ORDER — ESCITALOPRAM OXALATE 10 MG PO TABS: 10.0000 mg | ORAL_TABLET | Freq: Every day | ORAL | 1 refills | Status: DC

## 2016-09-23 MED FILL — cloNIDine HCL 0.1 MG TABS: 0.1 | 30 days supply | Qty: 60 | Fill #1

## 2016-09-23 NOTE — Progress Notes (Signed)
Patient is here for re establish care  Patient complains about stomach pain level is a 6 has a hernia the issue been around for a year  Patient also stated that she been having back pain for almost 3 years   Patient has eaten today  Patient has taking her meds today  Patient once in awhile takes tylenol for pain

## 2016-09-23 NOTE — Patient Instructions (Addendum)
Hypertension Hypertension is another name for high blood pressure. High blood pressure forces your heart to work harder to pump blood. A blood pressure reading has two numbers, which includes a higher number over a lower number (example: 110/72). Follow these instructions at home:  Have your blood pressure rechecked by your doctor.  Only take medicine as told by your doctor. Follow the directions carefully. The medicine does not work as well if you skip doses. Skipping doses also puts you at risk for problems.  Do not smoke.  Monitor your blood pressure at home as told by your doctor. Contact a doctor if:  You think you are having a reaction to the medicine you are taking.  You have repeat headaches or feel dizzy.  You have puffiness (swelling) in your ankles.  You have trouble with your vision. Get help right away if:  You get a very bad headache and are confused.  You feel weak, numb, or faint.  You get chest or belly (abdominal) pain.  You throw up (vomit).  You cannot breathe very well. This information is not intended to replace advice given to you by your health care provider. Make sure you discuss any questions you have with your health care provider. Document Released: 01/27/2008 Document Revised: 01/16/2016 Document Reviewed: 06/02/2013 Elsevier Interactive Patient Education  2017 [employer] Escitalopram tablets What is this medicine? ESCITALOPRAM (es sye TAL oh pram) is used to treat depression and certain types of anxiety. This medicine may be used for other purposes; ask your health care provider or pharmacist if you have questions. COMMON BRAND NAME(S): Lexapro What should I tell my health care provider before I take this medicine? They need to know if you have any of these conditions: -bipolar disorder or a family history of bipolar disorder -diabetes -glaucoma -heart disease -kidney or liver disease -receiving electroconvulsive therapy -seizures  (convulsions) -suicidal thoughts, plans, or attempt by you or a family member -an unusual or allergic reaction to escitalopram, the related drug citalopram, other medicines, foods, dyes, or preservatives -pregnant or trying to become pregnant -breast-feeding How should I use this medicine? Take this medicine by mouth with a glass of water. Follow the directions on the prescription label. You can take it with or without food. If it upsets your stomach, take it with food. Take your medicine at regular intervals. Do not take it more often than directed. Do not stop taking this medicine suddenly except upon the advice of your doctor. Stopping this medicine too quickly may cause serious side effects or your condition may worsen. A special MedGuide will be given to you by the pharmacist with each prescription and refill. Be sure to read this information carefully each time. Talk to your pediatrician regarding the use of this medicine in children. Special care may be needed. Overdosage: If you think you have taken too much of this medicine contact a poison control center or emergency room at once. NOTE: This medicine is only for you. Do not share this medicine with others. What if I miss a dose? If you miss a dose, take it as soon as you can. If it is almost time for your next dose, take only that dose. Do not take double or extra doses. What may interact with this medicine? Do not take this medicine with any of the following medications: -certain medicines for fungal infections like fluconazole, itraconazole, ketoconazole, posaconazole, voriconazole -cisapride -citalopram -dofetilide -dronedarone -linezolid -MAOIs like Carbex, Eldepryl, Marplan, Nardil, and Parnate -methylene blue (injected  into a vein) -pimozide -thioridazine -ziprasidone This medicine may also interact with the following medications: -alcohol -amphetamines -aspirin and aspirin-like medicines -carbamazepine -certain  medicines for depression, anxiety, or psychotic disturbances -certain medicines for migraine headache like almotriptan, eletriptan, frovatriptan, naratriptan, rizatriptan, sumatriptan, zolmitriptan -certain medicines for sleep -certain medicines that treat or prevent blood clots like warfarin, enoxaparin, dalteparin -cimetidine -diuretics -fentanyl -furazolidone -isoniazid -lithium -metoprolol -NSAIDs, medicines for pain and inflammation, like ibuprofen or naproxen -other medicines that prolong the QT interval (cause an abnormal heart rhythm) -procarbazine -rasagiline -supplements like St. John's wort, kava kava, valerian -tramadol -tryptophan This list may not describe all possible interactions. Give your health care provider a list of all the medicines, herbs, non-prescription drugs, or dietary supplements you use. Also tell them if you smoke, drink alcohol, or use illegal drugs. Some items may interact with your medicine. What should I watch for while using this medicine? Tell your doctor if your symptoms do not get better or if they get worse. Visit your doctor or health care professional for regular checks on your progress. Because it may take several weeks to see the full effects of this medicine, it is important to continue your treatment as prescribed by your doctor. Patients and their families should watch out for new or worsening thoughts of suicide or depression. Also watch out for sudden changes in feelings such as feeling anxious, agitated, panicky, irritable, hostile, aggressive, impulsive, severely restless, overly excited and hyperactive, or not being able to sleep. If this happens, especially at the beginning of treatment or after a change in dose, call your health care professional. Bonita Quin may get drowsy or dizzy. Do not drive, use machinery, or do anything that needs mental alertness until you know how this medicine affects you. Do not stand or sit up quickly, especially if you  are an older patient. This reduces the risk of dizzy or fainting spells. Alcohol may interfere with the effect of this medicine. Avoid alcoholic drinks. Your mouth may get dry. Chewing sugarless gum or sucking hard candy, and drinking plenty of water may help. Contact your doctor if the problem does not go away or is severe. What side effects may I notice from receiving this medicine? Side effects that you should report to your doctor or health care professional as soon as possible: -allergic reactions like skin rash, itching or hives, swelling of the face, lips, or tongue -anxious -black, tarry stools -changes in vision -confusion -elevated mood, decreased need for sleep, racing thoughts, impulsive behavior -eye pain -fast, irregular heartbeat -feeling faint or lightheaded, falls -feeling agitated, angry, or irritable -hallucination, loss of contact with reality -loss of balance or coordination -loss of memory -painful or prolonged erections -restlessness, pacing, inability to keep still -seizures -stiff muscles -suicidal thoughts or other mood changes -trouble sleeping -unusual bleeding or bruising -unusually weak or tired -vomiting Side effects that usually do not require medical attention (report to your doctor or health care professional if they continue or are bothersome): -changes in appetite -change in sex drive or performance -headache -increased sweating -indigestion, nausea -tremors This list may not describe all possible side effects. Call your doctor for medical advice about side effects. You may report side effects to FDA at 1-800-FDA-1088. Where should I keep my medicine? Keep out of reach of children. Store at room temperature between 15 and 30 degrees C (59 and 86 degrees F). Throw away any unused medicine after the expiration date. NOTE: This sheet is a summary. It may not cover  all possible information. If you have questions about this medicine, talk to your  doctor, pharmacist, or health care provider.  2017 Elsevier/Gold Standard (2016-01-13 13:20:23)

## 2016-09-23 NOTE — Progress Notes (Signed)
Subjective:  Patient ID: [name], female    DOB: July 22, 1986  Age: 31 y.o. MRN: [id number]  CC: Establish Care   HPI [name] presents for c/o 8 month history of abdominal pain. She is also requesting medication refills for HTN and anxiety. She reports history of umbilical hernia for last two years. Reports frequent episodes of nausea and with vomiting. Reports vomiting 15 times within the past month. She reports infrequent difficulty swallowing. Denies any changes to bowel habits. Reports history of anxiety and requests medication. Denies any SI/HI.    Outpatient Medications Prior to Visit  Medication Sig Dispense Refill  . albuterol (PROVENTIL HFA;VENTOLIN HFA) 108 (90 BASE) MCG/ACT inhaler Inhale 2 puffs into the lungs every 6 (six) hours as needed for wheezing or shortness of breath.    . cloNIDine (CATAPRES) 0.1 MG tablet TAKE 1 TABLET BY MOUTH TWICE DAILY. 60 tablet 4  . EPINEPHrine 0.3 mg/0.3 mL IJ SOAJ injection Inject 0.3 mLs (0.3 mg total) into the muscle once. 1 Device 0  . famotidine (PEPCID) 20 MG tablet Take 1 tablet (20 mg total) by mouth 2 (two) times daily. 30 tablet 0  . HYDROcodone-acetaminophen (NORCO/VICODIN) 5-325 MG tablet Take 1-2 tablets by mouth every 6 hours as needed for pain and/or cough. 7 tablet 0  . hydrOXYzine (ATARAX/VISTARIL) 25 MG tablet Take 1 tablet (25 mg total) by mouth every 6 (six) hours as needed for anxiety. 28 tablet 0  . hydrOXYzine (VISTARIL) 25 MG capsule Take 1 capsule (25 mg total) by mouth 3 (three) times daily as needed. (Patient not taking: Reported on 02/24/2016) 30 capsule 0  . omeprazole (PRILOSEC) 20 MG capsule Take 1 capsule (20 mg total) by mouth daily. 30 capsule 0  . promethazine (PHENERGAN) 25 MG tablet Take 1 tablet (25 mg total) by mouth every 6 (six) hours as needed for nausea or vomiting. 12 tablet 0  . propranolol (INDERAL) 40 MG tablet Take 1 tablet (40 mg total) by mouth 2 (two) times daily. 60 tablet 0    No facility-administered medications prior to visit.     ROS Review of Systems  HENT: Positive for trouble swallowing.   Respiratory: Negative.   Cardiovascular: Negative.   Gastrointestinal: Positive for abdominal pain.     Objective:  There were no vitals taken for this visit.  BP/Weight 02/24/2016 01/27/2016 01/26/2016  Systolic BP 116 152 -  Diastolic BP 79 97 -  Wt. (Lbs) - - 135  BMI - 26.37 -  Some encounter information is confidential and restricted. Go to Review Flowsheets activity to see all data.     Physical Exam  HENT:  Head: Normocephalic.  Right Ear: External ear normal.  Left Ear: External ear normal.  Nose: Nose normal.  Mouth/Throat: Oropharynx is clear and moist.  Neck: Normal range of motion. Neck supple.  Cardiovascular: Normal rate, regular rhythm, normal heart sounds and intact distal pulses.   Pulmonary/Chest: Effort normal and breath sounds normal.  Abdominal: Soft. Bowel sounds are normal. There is tenderness.  Psychiatric: Her mood appears anxious. Her speech is rapid and/or pressured.  Nursing note and vitals reviewed.    Assessment & Plan:   Problem List Items Addressed This Visit      Cardiovascular and Mediastinum   Hypertension (Chronic)   Relevant Medications   propranolol (INDERAL) 40 MG tablet   Other Relevant Orders   BASIC METABOLIC PANEL WITH GFR (Completed)   Microalbumin/Creatinine Ratio, Urine (Completed)    Other Visit  Diagnoses    RUQ abdominal pain    -  Primary   Relevant Orders   BASIC METABOLIC PANEL WITH GFR (Completed)   CBC with Differential (Completed)   Lipid Panel (Completed)   Hepatic Function Panel (Completed)   US Abdomen Complete   Generalized anxiety disorder       Relevant Medications   escitalopram (LEXAPRO) 10 MG tablet   Nausea and vomiting, intractability of vomiting not specified, unspecified vomiting type       Relevant Medications   metoCLOPramide (REGLAN) 10 MG tablet   Gastroesophageal  reflux disease, esophagitis presence not specified       Relevant Medications   ranitidine (ZANTAC) 150 MG tablet   metoCLOPramide (REGLAN) 10 MG tablet   Needs flu shot       Relevant Orders   Flu Vaccine QUAD 36+ mos PF IM (Fluarix & Fluzone Quad PF) (Completed)      Meds ordered this encounter  Medications  . propranolol (INDERAL) 40 MG tablet    Sig: Take 1 tablet (40 mg total) by mouth 2 (two) times daily.    Dispense:  60 tablet    Refill:  2    Order Specific Question:   Supervising Provider    Answer:   Quentin Angst [id number]  . ranitidine (ZANTAC) 150 MG tablet    Sig: Take 1 tablet (150 mg total) by mouth 2 (two) times daily.    Dispense:  30 tablet    Refill:  2    Order Specific Question:   Supervising Provider    Answer:   Quentin Angst [id number]  . metoCLOPramide (REGLAN) 10 MG tablet    Sig: Take 1 tablet (10 mg total) by mouth every 6 (six) hours as needed for nausea.    Dispense:  30 tablet    Refill:  0    Order Specific Question:   Supervising Provider    Answer:   Quentin Angst [id number]  . escitalopram (LEXAPRO) 10 MG tablet    Sig: Take 1 tablet (10 mg total) by mouth daily. After 1 week may increase to 2 tablets (20 mg total) by mouth daily    Dispense:  60 tablet    Refill:  1    Order Specific Question:   Supervising Provider    Answer:   Quentin Angst [[id number]]    Follow-up: Return in about 3 months (around 12/21/2016) for Hypertension. Return if symptoms worsen or fail to improve.   Lizbeth Bark FNP

## 2016-09-24 LAB — MICROALBUMIN / CREATININE URINE RATIO
Creatinine, Urine: 223 mg/dL (ref 20–320)
Microalb Creat Ratio: 25 mcg/mg creat (ref <30)
Microalb, Ur: 5.5 mg/dL

## 2016-09-25 ENCOUNTER — Ambulatory Visit (HOSPITAL_COMMUNITY): Payer: Self-pay

## 2016-09-30 ENCOUNTER — Telehealth: Payer: Self-pay | Admitting: Family Medicine

## 2016-09-30 ENCOUNTER — Other Ambulatory Visit: Payer: Self-pay | Admitting: Family Medicine

## 2016-09-30 DIAGNOSIS — R109 Unspecified abdominal pain: Secondary | ICD-10-CM

## 2016-09-30 DIAGNOSIS — R112 Nausea with vomiting, unspecified: Secondary | ICD-10-CM

## 2016-09-30 NOTE — Telephone Encounter (Signed)
New order placed

## 2016-09-30 NOTE — Telephone Encounter (Signed)
Patient called the office to speak with nurse regarding her orders for her ultrasound. Pt missed her appointment and was informed by [facility] to call us to reschedule his ultrasound order. Pt needs this as soon as possible. Please advice

## 2016-09-30 NOTE — Telephone Encounter (Signed)
Patient called the office to speak with nurse regarding her orders for her ultrasound. Pt missed her appointment and was informed by [facility] to call us to reschedule his ultrasound order. Pt needs this as soon as possible. Please follow up.  Thank you.

## 2016-10-01 NOTE — Telephone Encounter (Signed)
CMA call to inform her that I already rescheduled her appt for Friday 10/09/2016   Patient was aware of her appt & that nothing to eat/drink prior to the appt

## 2016-10-02 ENCOUNTER — Encounter (HOSPITAL_COMMUNITY): Payer: Self-pay | Admitting: *Deleted

## 2016-10-02 ENCOUNTER — Inpatient Hospital Stay (HOSPITAL_COMMUNITY)
Admission: EM | Admit: 2016-10-02 | Discharge: 2016-10-04 | DRG: 355 | Disposition: A | Discharge status: Home / Self Care | Payer: Self-pay | Attending: Surgery | Admitting: Surgery

## 2016-10-02 DIAGNOSIS — K436 Other and unspecified ventral hernia with obstruction, without gangrene: Principal | ICD-10-CM | POA: Diagnosis present

## 2016-10-02 DIAGNOSIS — K439 Ventral hernia without obstruction or gangrene: Secondary | ICD-10-CM | POA: Diagnosis present

## 2016-10-02 DIAGNOSIS — F1410 Cocaine abuse, uncomplicated: Secondary | ICD-10-CM | POA: Diagnosis present

## 2016-10-02 DIAGNOSIS — F17210 Nicotine dependence, cigarettes, uncomplicated: Secondary | ICD-10-CM | POA: Diagnosis present

## 2016-10-02 DIAGNOSIS — Z79899 Other long term (current) drug therapy: Secondary | ICD-10-CM

## 2016-10-02 DIAGNOSIS — I10 Essential (primary) hypertension: Secondary | ICD-10-CM | POA: Diagnosis present

## 2016-10-02 DIAGNOSIS — K7460 Unspecified cirrhosis of liver: Secondary | ICD-10-CM | POA: Diagnosis present

## 2016-10-02 LAB — CBC
HCT: 43.7 % (ref 36.0–46.0)
Hemoglobin: 15.2 g/dL — ABNORMAL HIGH (ref 12.0–15.0)
MCH: 34.1 pg — AB (ref 26.0–34.0)
MCHC: 34.8 g/dL (ref 30.0–36.0)
MCV: 98 fL (ref 78.0–100.0)
PLATELETS: 222 10*3/uL (ref 150–400)
RBC: 4.46 MIL/uL (ref 3.87–5.11)
RDW: 13.4 % (ref 11.5–15.5)
WBC: 14 10*3/uL — ABNORMAL HIGH (ref 4.0–10.5)

## 2016-10-02 LAB — URINALYSIS, ROUTINE W REFLEX MICROSCOPIC
BACTERIA UA: NONE SEEN
Bilirubin Urine: NEGATIVE
Glucose, UA: 50 mg/dL — AB
KETONES UR: NEGATIVE mg/dL
Nitrite: NEGATIVE
PH: 5 (ref 5.0–8.0)
Protein, ur: NEGATIVE mg/dL
SPECIFIC GRAVITY, URINE: 1.008 (ref 1.005–1.030)

## 2016-10-02 LAB — COMPREHENSIVE METABOLIC PANEL
ALBUMIN: 3.8 g/dL (ref 3.5–5.0)
ALT: 46 U/L (ref 14–54)
AST: 34 U/L (ref 15–41)
Alkaline Phosphatase: 90 U/L (ref 38–126)
Anion gap: 16 — ABNORMAL HIGH (ref 5–15)
BUN: 6 mg/dL (ref 6–20)
CHLORIDE: 102 mmol/L (ref 101–111)
CO2: 22 mmol/L (ref 22–32)
CREATININE: 0.65 mg/dL (ref 0.44–1.00)
Calcium: 9.2 mg/dL (ref 8.9–10.3)
GFR calc Af Amer: >60 mL/min (ref >60)
GFR calc non Af Amer: >60 mL/min (ref >60)
Glucose, Bld: 128 mg/dL — ABNORMAL HIGH (ref 65–99)
Potassium: 3.3 mmol/L — ABNORMAL LOW (ref 3.5–5.1)
SODIUM: 140 mmol/L (ref 135–145)
Total Bilirubin: 0.5 mg/dL (ref 0.3–1.2)
Total Protein: 7.1 g/dL (ref 6.5–8.1)

## 2016-10-02 LAB — LIPASE, BLOOD: LIPASE: 38 U/L (ref 11–51)

## 2016-10-02 LAB — POC URINE PREG, ED: Preg Test, Ur: NEGATIVE

## 2016-10-02 MED ORDER — FENTANYL CITRATE (PF) 100 MCG/2ML IJ SOLN
100.0000 ug | Freq: Once | INTRAMUSCULAR | Status: AC
  Administered 2016-10-03: 100 ug via INTRAVENOUS
  Filled 2016-10-02: qty 2

## 2016-10-02 MED ORDER — SODIUM CHLORIDE 0.9 % IV BOLUS (SEPSIS)
1000.0000 mL | Freq: Once | INTRAVENOUS | Status: AC
  Administered 2016-10-03: 1000 mL via INTRAVENOUS

## 2016-10-02 MED ORDER — ONDANSETRON HCL 4 MG/2ML IJ SOLN
4.0000 mg | Freq: Once | INTRAMUSCULAR | Status: AC
  Administered 2016-10-03: 4 mg via INTRAVENOUS
  Filled 2016-10-02: qty 2

## 2016-10-02 NOTE — ED Triage Notes (Signed)
The pt has a known  abd hernia for 2 years and she has had  Numerus trips to the ed for the same.  Her last trip was one week ago ??  nv and diarrhea.  lmp jan27th

## 2016-10-02 NOTE — ED Notes (Signed)
Pt here for abd pain with a known hernia. Dr. Criss Alvine performing exam upon entering room. Dr. Criss Alvine feels hernia upon his exam.

## 2016-10-02 NOTE — ED Provider Notes (Signed)
[facility]-EMERGENCY DEPT Provider Note   CSN: [id number] Arrival date & time: 10/02/16  1954 By signing my name below, I, Levon Hedger, attest that this documentation has been prepared under the direction and in the presence of Pricilla Loveless, MD . Electronically Signed: Levon Hedger, Scribe. 10/02/2016. 11:35 PM.   History   Chief Complaint Chief Complaint  Patient presents with  . Abdominal Pain    HPI [name] is a 31 y.o. female with a PMHx of cirrhosis, HTN, and anxiety who presents to the Emergency Department complaining of acute on chronic, gradually worsening periumbilical pain x 2 years that significantly worsened today. Pt states she has a known abdominal hernia and has been seen in the ED for the three times. Pain is exacerbated by urinating, bowel movements, coughing, walking, or with palpation. She has taken tylenol with no relief of pain. She notes associated nausea and vomiting, and states she typically vomits 16 mornings per month. Pt states she has an appointment next week for an ultrasound, but she is not currently followed by surgery. She denies any constipation or fever. Pt has no other complaints or symptoms at this time.   The history is provided by the patient. No language interpreter was used.   Past Medical History:  Diagnosis Date  . Anxiety   . Cirrhosis of liver (HCC)   . Hypertension   . Renal disorder    Kidney Infection    Patient Active Problem List   Diagnosis Date Noted  . Incarcerated ventral hernia 10/03/2016  . Anxiety 02/15/2014  . Hypertension 02/15/2014    Past Surgical History:  Procedure Laterality Date  . ARTERY REPAIR    . CESAREAN SECTION     x 5  . MOUTH SURGERY    . TEAR DUCT PROBING    . TUBAL LIGATION      OB History    No data available       Home Medications    Prior to Admission medications   Medication Sig Start Date End Date Taking? Authorizing Provider  cloNIDine (CATAPRES) 0.1 MG tablet TAKE 1 TABLET  BY MOUTH TWICE DAILY. 08/04/16  Yes Quentin Angst, MD  propranolol (INDERAL) 40 MG tablet Take 1 tablet (40 mg total) by mouth 2 (two) times daily. 09/23/16  Yes Lizbeth Bark, FNP  escitalopram (LEXAPRO) 10 MG tablet Take 1 tablet (10 mg total) by mouth daily. After 1 week may increase to 2 tablets (20 mg total) by mouth daily Patient not taking: Reported on 10/03/2016 09/23/16   Lizbeth Bark, FNP  famotidine (PEPCID) 20 MG tablet Take 1 tablet (20 mg total) by mouth 2 (two) times daily. Patient not taking: Reported on 09/23/2016 02/24/16   Jaynie Crumble, PA-C  HYDROcodone-acetaminophen (NORCO/VICODIN) 5-325 MG tablet Take 1-2 tablets by mouth every 6 hours as needed for pain and/or cough. Patient not taking: Reported on 09/23/2016 05/08/16   Joni Reining Pisciotta, PA-C  hydrOXYzine (ATARAX/VISTARIL) 25 MG tablet Take 1 tablet (25 mg total) by mouth every 6 (six) hours as needed for anxiety. Patient not taking: Reported on 09/23/2016 05/08/16   Joni Reining Pisciotta, PA-C  hydrOXYzine (VISTARIL) 25 MG capsule Take 1 capsule (25 mg total) by mouth 3 (three) times daily as needed. Patient not taking: Reported on 02/24/2016 06/17/15   Ambrose Finland, NP  metoCLOPramide (REGLAN) 10 MG tablet Take 1 tablet (10 mg total) by mouth every 6 (six) hours as needed for nausea. Patient not taking: Reported on 10/03/2016 09/23/16  Lizbeth Bark, FNP  omeprazole (PRILOSEC) 20 MG capsule Take 1 capsule (20 mg total) by mouth daily. Patient not taking: Reported on 09/23/2016 02/24/16   Jaynie Crumble, PA-C  promethazine (PHENERGAN) 25 MG tablet Take 1 tablet (25 mg total) by mouth every 6 (six) hours as needed for nausea or vomiting. Patient not taking: Reported on 09/23/2016 05/08/16   Joni Reining Pisciotta, PA-C  ranitidine (ZANTAC) 150 MG tablet Take 1 tablet (150 mg total) by mouth 2 (two) times daily. Patient not taking: Reported on 10/03/2016 09/23/16   Lizbeth Bark, FNP    Family History Family  History  Problem Relation Age of Onset  . CAD Other   . Diabetes Other   . Hypertension Other   . Cancer Other   . Thyroid disease Other     Social History Social History  Substance Use Topics  . Smoking status: Current Every Day Smoker    Packs/day: 0.50    Types: Cigarettes  . Smokeless tobacco: Never Used  . Alcohol use Yes     Comment: 3 days a week     Allergies   Ibuprofen; Acetaminophen; Codeine; Lisinopril; Penicillins; and Ativan [lorazepam]   Review of Systems Review of Systems  Constitutional: Negative for fever.  Gastrointestinal: Positive for abdominal pain, nausea and vomiting. Negative for constipation.  All other systems reviewed and are negative.  Physical Exam Updated Vital Signs BP 134/90   Pulse 106   Temp 98.5 F (36.9 C) (Oral)   Resp 18   Ht 5' (1.524 m)   Wt 143 lb 3 oz (64.9 kg)   LMP 09/22/2016   SpO2 97%   BMI 27.96 kg/m   Physical Exam  Constitutional: She is oriented to person, place, and time. She appears well-developed and well-nourished.  HENT:  Head: Normocephalic and atraumatic.  Right Ear: External ear normal.  Left Ear: External ear normal.  Nose: Nose normal.  Eyes: Right eye exhibits no discharge. Left eye exhibits no discharge.  Cardiovascular: Normal rate, regular rhythm and normal heart sounds.   Pulmonary/Chest: Effort normal and breath sounds normal.  Abdominal: Soft. There is tenderness.    Neurological: She is alert and oriented to person, place, and time.  Skin: Skin is warm and dry.  Nursing note and vitals reviewed.  ED Treatments / Results  DIAGNOSTIC STUDIES:  Oxygen Saturation is 98% on RA, normal by my interpretation.    COORDINATION OF CARE:  11:31 PM Discussed treatment plan with pt at bedside and pt agreed to plan.   Labs (all labs ordered are listed, but only abnormal results are displayed) Labs Reviewed  COMPREHENSIVE METABOLIC PANEL - Abnormal; Notable for the following:       Result  Value   Potassium 3.3 (*)    Glucose, Bld 128 (*)    Anion gap 16 (*)    All other components within normal limits  CBC - Abnormal; Notable for the following:    WBC 14.0 (*)    Hemoglobin 15.2 (*)    MCH 34.1 (*)    All other components within normal limits  URINALYSIS, ROUTINE W REFLEX MICROSCOPIC - Abnormal; Notable for the following:    APPearance CLOUDY (*)    Glucose, UA 50 (*)    Hgb urine dipstick SMALL (*)    Leukocytes, UA MODERATE (*)    Squamous Epithelial / LPF 0-5 (*)    All other components within normal limits  LIPASE, BLOOD  RAPID URINE DRUG SCREEN, HOSP PERFORMED  POC URINE PREG, ED    EKG  EKG Interpretation None       Radiology No results found.  Procedures Procedures (including critical care time)  Medications Ordered in ED Medications  ondansetron (ZOFRAN) injection 4 mg (4 mg Intravenous Given 10/03/16 0007)  sodium chloride 0.9 % bolus 1,000 mL (0 mLs Intravenous Stopped 10/03/16 0227)  fentaNYL (SUBLIMAZE) injection 100 mcg (100 mcg Intravenous Given 10/03/16 0007)  fentaNYL (SUBLIMAZE) injection 150 mcg (150 mcg Intravenous Given 10/03/16 0112)     Initial Impression / Assessment and Plan / ED Course  I have reviewed the triage vital signs and the nursing notes.  Pertinent labs & imaging results that were available during my care of the patient were reviewed by me and considered in my medical decision making (see chart for details).     I attempted twice to reduce the patient's ventral hernia after IV fentanyl, Trendelenburg position, and ice. However this was unsuccessful. She has an elevation in her white blood cell count but overall does not appear ill. I think ischemia is unlikely. However given it is still incarcerated, general surgery consulted and will admit for an operation tomorrow morning. No vomiting while in the ED.  Final Clinical Impressions(s) / ED Diagnoses   Final diagnoses:  Incarcerated ventral hernia    New  Prescriptions New Prescriptions   No medications on file   I personally performed the services described in this documentation, which was scribed in my presence. The recorded information has been reviewed and is accurate.    Pricilla Loveless, MD 10/03/16 [phone]

## 2016-10-03 ENCOUNTER — Observation Stay (HOSPITAL_COMMUNITY): Payer: Self-pay | Admitting: Certified Registered"

## 2016-10-03 ENCOUNTER — Encounter (HOSPITAL_COMMUNITY): Admission: EM | Disposition: A | Discharge status: Home / Self Care | Payer: Self-pay | Source: Home / Self Care

## 2016-10-03 ENCOUNTER — Encounter (HOSPITAL_COMMUNITY): Payer: Self-pay | Admitting: *Deleted

## 2016-10-03 DIAGNOSIS — K436 Other and unspecified ventral hernia with obstruction, without gangrene: Secondary | ICD-10-CM | POA: Diagnosis present

## 2016-10-03 HISTORY — PX: VENTRAL HERNIA REPAIR: SHX424

## 2016-10-03 LAB — RAPID URINE DRUG SCREEN, HOSP PERFORMED
AMPHETAMINES: NOT DETECTED
Barbiturates: NOT DETECTED
Benzodiazepines: NOT DETECTED
Cocaine: POSITIVE — AB
OPIATES: NOT DETECTED
TETRAHYDROCANNABINOL: NOT DETECTED

## 2016-10-03 LAB — MRSA PCR SCREENING: MRSA BY PCR: NEGATIVE

## 2016-10-03 SURGERY — REPAIR, HERNIA, VENTRAL
Anesthesia: General | Site: Abdomen

## 2016-10-03 MED ORDER — LIDOCAINE 2% (20 MG/ML) 5 ML SYRINGE
INTRAMUSCULAR | Status: DC | PRN
  Administered 2016-10-03: 70 mg via INTRAVENOUS

## 2016-10-03 MED ORDER — ONDANSETRON HCL 4 MG/2ML IJ SOLN
INTRAMUSCULAR | Status: AC
  Filled 2016-10-03: qty 2

## 2016-10-03 MED ORDER — PROPOFOL 10 MG/ML IV BOLUS
INTRAVENOUS | Status: AC
  Filled 2016-10-03: qty 20

## 2016-10-03 MED ORDER — VANCOMYCIN HCL IN DEXTROSE 1-5 GM/200ML-% IV SOLN
1000.0000 mg | INTRAVENOUS | Status: AC
  Filled 2016-10-03 (×2): qty 200

## 2016-10-03 MED ORDER — ROCURONIUM BROMIDE 10 MG/ML (PF) SYRINGE
PREFILLED_SYRINGE | INTRAVENOUS | Status: DC | PRN
  Administered 2016-10-03: 30 mg via INTRAVENOUS

## 2016-10-03 MED ORDER — ONDANSETRON HCL 4 MG/2ML IJ SOLN: 4.0000 mg | Freq: Four times a day (QID) | INTRAMUSCULAR | Status: DC | PRN

## 2016-10-03 MED ORDER — 0.9 % SODIUM CHLORIDE (POUR BTL) OPTIME
TOPICAL | Status: DC | PRN
  Administered 2016-10-03: 1000 mL

## 2016-10-03 MED ORDER — SUGAMMADEX SODIUM 200 MG/2ML IV SOLN
INTRAVENOUS | Status: DC | PRN
  Administered 2016-10-03: 200 mg via INTRAVENOUS

## 2016-10-03 MED ORDER — PROMETHAZINE HCL 25 MG/ML IJ SOLN: 6.2500 mg | INTRAMUSCULAR | Status: DC | PRN

## 2016-10-03 MED ORDER — HYDROMORPHONE HCL 2 MG/ML IJ SOLN
1.0000 mg | INTRAMUSCULAR | Status: DC | PRN
  Administered 2016-10-03 – 2016-10-04 (×3): 1 mg via INTRAVENOUS
  Filled 2016-10-03 (×3): qty 1

## 2016-10-03 MED ORDER — OXYCODONE HCL 5 MG PO TABS
5.0000 mg | ORAL_TABLET | ORAL | Status: DC | PRN
  Administered 2016-10-03 – 2016-10-04 (×6): 10 mg via ORAL
  Filled 2016-10-03 (×6): qty 2

## 2016-10-03 MED ORDER — BUPIVACAINE-EPINEPHRINE (PF) 0.5% -1:200000 IJ SOLN
INTRAMUSCULAR | Status: AC
  Filled 2016-10-03: qty 30

## 2016-10-03 MED ORDER — HYDROMORPHONE HCL 1 MG/ML IJ SOLN
0.2500 mg | INTRAMUSCULAR | Status: DC | PRN
  Administered 2016-10-03: 0.5 mg via INTRAVENOUS

## 2016-10-03 MED ORDER — SUCCINYLCHOLINE CHLORIDE 200 MG/10ML IV SOSY
PREFILLED_SYRINGE | INTRAVENOUS | Status: DC | PRN
  Administered 2016-10-03: 100 mg via INTRAVENOUS

## 2016-10-03 MED ORDER — NICOTINE 14 MG/24HR TD PT24
14.0000 mg | MEDICATED_PATCH | TRANSDERMAL | Status: DC
  Administered 2016-10-03: 14 mg via TRANSDERMAL
  Filled 2016-10-03: qty 1

## 2016-10-03 MED ORDER — ONDANSETRON HCL 4 MG/2ML IJ SOLN
4.0000 mg | Freq: Four times a day (QID) | INTRAMUSCULAR | Status: DC | PRN
  Administered 2016-10-03: 4 mg via INTRAVENOUS
  Filled 2016-10-03: qty 2

## 2016-10-03 MED ORDER — MIDAZOLAM HCL 2 MG/2ML IJ SOLN
INTRAMUSCULAR | Status: AC
  Filled 2016-10-03: qty 2

## 2016-10-03 MED ORDER — DEXAMETHASONE SODIUM PHOSPHATE 10 MG/ML IJ SOLN
INTRAMUSCULAR | Status: DC | PRN
  Administered 2016-10-03: 5 mg via INTRAVENOUS

## 2016-10-03 MED ORDER — KETOROLAC TROMETHAMINE 30 MG/ML IJ SOLN
INTRAMUSCULAR | Status: DC | PRN
  Administered 2016-10-03: 30 mg via INTRAVENOUS

## 2016-10-03 MED ORDER — DIPHENHYDRAMINE HCL 25 MG PO CAPS: 25.0000 mg | ORAL_CAPSULE | Freq: Four times a day (QID) | ORAL | Status: DC | PRN

## 2016-10-03 MED ORDER — HYDROMORPHONE HCL 1 MG/ML IJ SOLN
INTRAMUSCULAR | Status: AC
  Administered 2016-10-03: 0.5 mg via INTRAVENOUS
  Filled 2016-10-03: qty 0.5

## 2016-10-03 MED ORDER — FENTANYL CITRATE (PF) 100 MCG/2ML IJ SOLN
INTRAMUSCULAR | Status: DC | PRN
  Administered 2016-10-03: 100 ug via INTRAVENOUS

## 2016-10-03 MED ORDER — PHENYLEPHRINE 40 MCG/ML (10ML) SYRINGE FOR IV PUSH (FOR BLOOD PRESSURE SUPPORT)
PREFILLED_SYRINGE | INTRAVENOUS | Status: AC
  Filled 2016-10-03: qty 20

## 2016-10-03 MED ORDER — CLONIDINE HCL 0.1 MG PO TABS
0.1000 mg | ORAL_TABLET | Freq: Two times a day (BID) | ORAL | Status: DC
  Administered 2016-10-03 – 2016-10-04 (×3): 0.1 mg via ORAL
  Filled 2016-10-03 (×3): qty 1

## 2016-10-03 MED ORDER — LIDOCAINE 2% (20 MG/ML) 5 ML SYRINGE
INTRAMUSCULAR | Status: AC
  Filled 2016-10-03: qty 15

## 2016-10-03 MED ORDER — PROPOFOL 10 MG/ML IV BOLUS
INTRAVENOUS | Status: DC | PRN
  Administered 2016-10-03: 200 mg via INTRAVENOUS

## 2016-10-03 MED ORDER — ONDANSETRON 4 MG PO TBDP: 4.0000 mg | ORAL_TABLET | Freq: Four times a day (QID) | ORAL | Status: DC | PRN

## 2016-10-03 MED ORDER — BUPIVACAINE HCL (PF) 0.25 % IJ SOLN
INTRAMUSCULAR | Status: AC
  Filled 2016-10-03: qty 30

## 2016-10-03 MED ORDER — VANCOMYCIN HCL 1000 MG IV SOLR
INTRAVENOUS | Status: DC | PRN
  Administered 2016-10-03: 1000 mg via INTRAVENOUS

## 2016-10-03 MED ORDER — FENTANYL CITRATE (PF) 100 MCG/2ML IJ SOLN
150.0000 ug | Freq: Once | INTRAMUSCULAR | Status: AC
  Administered 2016-10-03: 150 ug via INTRAVENOUS
  Filled 2016-10-03: qty 4

## 2016-10-03 MED ORDER — LACTATED RINGERS IV SOLN
INTRAVENOUS | Status: DC
  Administered 2016-10-03: 10:00:00 via INTRAVENOUS

## 2016-10-03 MED ORDER — SUGAMMADEX SODIUM 200 MG/2ML IV SOLN
INTRAVENOUS | Status: AC
  Filled 2016-10-03: qty 2

## 2016-10-03 MED ORDER — FENTANYL CITRATE (PF) 100 MCG/2ML IJ SOLN
INTRAMUSCULAR | Status: AC
  Filled 2016-10-03: qty 4

## 2016-10-03 MED ORDER — DEXAMETHASONE SODIUM PHOSPHATE 10 MG/ML IJ SOLN
INTRAMUSCULAR | Status: AC
  Filled 2016-10-03: qty 1

## 2016-10-03 MED ORDER — PROPRANOLOL HCL 10 MG PO TABS
40.0000 mg | ORAL_TABLET | Freq: Two times a day (BID) | ORAL | Status: DC
  Administered 2016-10-03: 20 mg via ORAL
  Administered 2016-10-03 – 2016-10-04 (×2): 40 mg via ORAL
  Filled 2016-10-03 (×3): qty 4

## 2016-10-03 MED ORDER — DIPHENHYDRAMINE HCL 50 MG/ML IJ SOLN: 25.0000 mg | Freq: Four times a day (QID) | INTRAMUSCULAR | Status: DC | PRN

## 2016-10-03 MED ORDER — HYDRALAZINE HCL 20 MG/ML IJ SOLN: 10.0000 mg | INTRAMUSCULAR | Status: DC | PRN

## 2016-10-03 MED ORDER — ROCURONIUM BROMIDE 50 MG/5ML IV SOSY
PREFILLED_SYRINGE | INTRAVENOUS | Status: AC
  Filled 2016-10-03: qty 5

## 2016-10-03 MED ORDER — MIDAZOLAM HCL 5 MG/5ML IJ SOLN
INTRAMUSCULAR | Status: DC | PRN
  Administered 2016-10-03: 2 mg via INTRAVENOUS

## 2016-10-03 MED ORDER — BUPIVACAINE HCL 0.25 % IJ SOLN
INTRAMUSCULAR | Status: DC | PRN
  Administered 2016-10-03: 10 mL

## 2016-10-03 MED ORDER — ENOXAPARIN SODIUM 40 MG/0.4ML ~~LOC~~ SOLN
40.0000 mg | SUBCUTANEOUS | Status: DC
  Administered 2016-10-04: 40 mg via SUBCUTANEOUS
  Filled 2016-10-03: qty 0.4

## 2016-10-03 MED ORDER — SODIUM CHLORIDE 0.9 % IV SOLN
INTRAVENOUS | Status: DC
  Administered 2016-10-03 – 2016-10-04 (×3): via INTRAVENOUS

## 2016-10-03 SURGICAL SUPPLY — 35 items
ADH SKN CLS LQ APL DERMABOND (GAUZE/BANDAGES/DRESSINGS) ×1
BLADE SURG ROTATE 9660 (MISCELLANEOUS) IMPLANT
COVER SURGICAL LIGHT HANDLE (MISCELLANEOUS) ×3 IMPLANT
DERMABOND ADHESIVE PROPEN (GAUZE/BANDAGES/DRESSINGS) ×2
DERMABOND ADVANCED .7 DNX6 (GAUZE/BANDAGES/DRESSINGS) IMPLANT
DRAIN CHANNEL 19F RND (DRAIN) IMPLANT
DRAPE INCISE IOBAN 66X45 STRL (DRAPES) IMPLANT
DRAPE LAPAROSCOPIC ABDOMINAL (DRAPES) ×3 IMPLANT
ELECT CAUTERY BLADE 6.4 (BLADE) ×3 IMPLANT
ELECT REM PT RETURN 9FT ADLT (ELECTROSURGICAL) ×3
ELECTRODE REM PT RTRN 9FT ADLT (ELECTROSURGICAL) ×1 IMPLANT
EVACUATOR SILICONE 100CC (DRAIN) IMPLANT
GAUZE SPONGE 4X4 12PLY STRL (GAUZE/BANDAGES/DRESSINGS) IMPLANT
GLOVE BIO SURGEON STRL SZ7.5 (GLOVE) ×3 IMPLANT
GLOVE BIOGEL PI IND STRL 8 (GLOVE) ×1 IMPLANT
GLOVE BIOGEL PI INDICATOR 8 (GLOVE) ×2
GOWN STRL REUS W/ TWL LRG LVL3 (GOWN DISPOSABLE) ×1 IMPLANT
GOWN STRL REUS W/ TWL XL LVL3 (GOWN DISPOSABLE) ×1 IMPLANT
GOWN STRL REUS W/TWL LRG LVL3 (GOWN DISPOSABLE) ×3
GOWN STRL REUS W/TWL XL LVL3 (GOWN DISPOSABLE) ×3
KIT BASIN OR (CUSTOM PROCEDURE TRAY) ×3 IMPLANT
KIT ROOM TURNOVER OR (KITS) ×3 IMPLANT
MESH VENTRALEX ST 2.5 CRC MED (Mesh General) ×2 IMPLANT
NS IRRIG 1000ML POUR BTL (IV SOLUTION) ×3 IMPLANT
PACK GENERAL/GYN (CUSTOM PROCEDURE TRAY) ×3 IMPLANT
PAD ARMBOARD 7.5X6 YLW CONV (MISCELLANEOUS) ×6 IMPLANT
STAPLER VISISTAT 35W (STAPLE) IMPLANT
SUT ETHILON 3 0 FSL (SUTURE) IMPLANT
SUT NOVA NAB GS-21 0 18 T12 DT (SUTURE) IMPLANT
SUT PDS AB 0 CT 36 (SUTURE) IMPLANT
SUT PROLENE 0 CT 1 30 (SUTURE) ×8 IMPLANT
SUT VICRYL AB 2 0 TIES (SUTURE) ×3 IMPLANT
TOWEL OR 17X24 6PK STRL BLUE (TOWEL DISPOSABLE) ×3 IMPLANT
TOWEL OR 17X26 10 PK STRL BLUE (TOWEL DISPOSABLE) ×3 IMPLANT
TRAY FOLEY CATH 16FR SILVER (SET/KITS/TRAYS/PACK) IMPLANT

## 2016-10-03 NOTE — Anesthesia Postprocedure Evaluation (Addendum)
Anesthesia Post Note  Patient: [name]  Procedure(s) Performed: Procedure(s) (LRB): OPEN INCARCERATED HERNIA REPAIR VENTRAL ADULT (N/A)  Patient location during evaluation: PACU Anesthesia Type: General Level of consciousness: awake and alert Pain management: pain level controlled Vital Signs Assessment: post-procedure vital signs reviewed and stable Respiratory status: spontaneous breathing, nonlabored ventilation, respiratory function stable and patient connected to nasal cannula oxygen Cardiovascular status: blood pressure returned to baseline and stable Postop Assessment: no signs of nausea or vomiting Anesthetic complications: no       Last Vitals:  Vitals:   10/03/16 1157 10/03/16 1205  BP: 133/86   Pulse: 82 75  Resp: 13   Temp:      Last Pain:  Vitals:   10/03/16 1127  TempSrc:   PainSc: 2                  [name]

## 2016-10-03 NOTE — Op Note (Signed)
10/03/2016  11:12 AM  PATIENT:  [name]  31 y.o. female  PRE-OPERATIVE DIAGNOSIS:  incarcerated ventral hernia  POST-OPERATIVE DIAGNOSIS:  incarcerated ventral hernia  PROCEDURE:  Procedure(s): OPEN INCARCERATED HERNIA REPAIR WITH MESH VENTRAL ADULT (N/A)  SURGEON:  Surgeon(s) and Role:    * Axel Filler, MD - Primary  ANESTHESIA:   local and general  EBL:  Total I/O In: 288 [I.V.:288] Out: 10 [Blood:10]  BLOOD ADMINISTERED:none  DRAINS: none   LOCAL MEDICATIONS USED:  BUPIVICAINE   SPECIMEN:  No Specimen  DISPOSITION OF SPECIMEN:  N/A  COUNTS:  YES  TOURNIQUET:  * No tourniquets in log *  DICTATION: .Dragon Dictation Details of the procedure: The patient was taken back to the operating room. The patient was placed in supine position with bilateral SCDs in place. The patient was prepped and draped in the usual sterile fashion.  After appropriate anitbiotics were confirmed, a time-out was confirmed and all facts were verified.    A 4 cM paramedian incision was made over the area of the incarcerated hernia. Dissection was taken down to the incarcerated omentum. This was superficially dissected away from the transcutaneous tissue. Dissection was then taken down to the fascia. The hernia was easily seen. The hernia was approximately 1 cm. At this time the omentum was then placed back into the abdominal cavity. The surrounding fascia was then lifted away from the subcutaneous tissue. This was dissected back to good healthy fascia. At this time a Bard ventral light 6.4 cm mesh was introduced into the abdominal cavity. This was pulled up and sutured to the fascia using a 0 Prolene 2. At this time the fascia was reapproximated using interrupted oh Prolenes. This reapproximated the fascial well. There is no undue tension. At this time 3-0 Vicryl's were used to reapproximate the subcutaneous layer. The skin was then reapproximated using 4-0 Monocryl subcuticular fashion.  The skin was dressed with Dermabond.  The patient how the procedure well was taken to the recovery room in stable condition.   PLAN OF CARE: Admit for overnight observation  PATIENT DISPOSITION:  PACU - hemodynamically stable.   Delay start of Pharmacological VTE agent (>24hrs) due to surgical blood loss or risk of bleeding: not applicable

## 2016-10-03 NOTE — H&P (Signed)
[name] is an 31 y.o. female.   Chief Complaint: Abdominal pain HPI: This is a 31 yo female with a history of a right-sided periumbilical hernia for at least three years presents with acute onset of worsening right-sided pain after sneezing.  She had a CT scan in June 2017 which showed a fat-containing hernia with a very small defect just to the right of her umbilicus.  Surgery follow-up was recommended, but she never made an appointment.  The hernia became much more firm and tender tonight.  She has had some nausea, but she has chronic nausea.  BM earlier yesterday.  Past Medical History:  Diagnosis Date  . Anxiety   . Cirrhosis of liver ([place])   . Hypertension   . Renal disorder    Kidney Infection     Past Surgical History:  Procedure Laterality Date  . ARTERY REPAIR    . CESAREAN SECTION     x 5  . MOUTH SURGERY    . TEAR DUCT PROBING    . TUBAL LIGATION      Family History  Problem Relation Age of Onset  . CAD Other   . Diabetes Other   . Hypertension Other   . Cancer Other   . Thyroid disease Other    Social History:  reports that she has been smoking Cigarettes.  She has been smoking about 0.50 packs per day. She has never used smokeless tobacco. She reports that she drinks alcohol. She reports that she uses drugs, including Cocaine. Patient reports 2-3 beers daily Last cocaine use reported 2 days ago  Allergies:  Allergies  Allergen Reactions  . Ibuprofen Anaphylaxis    Patient thinks it may have caused her throat to swell  . Acetaminophen     Causes  patient to have an upset stomach  . Codeine Hives and Itching  . Lisinopril Swelling  . Penicillins Hives and Swelling    Has patient had a PCN reaction causing immediate rash, facial/tongue/throat swelling, SOB or lightheadedness with hypotension: Yes Has patient had a PCN reaction causing severe rash involving mucus membranes or skin necrosis: unknown Has patient had a PCN reaction that required  hospitalization No Has patient had a PCN reaction occurring within the last 10 years: No If all of the above answers are "NO", then may proceed with Cephalosporin use.   . Ativan [Lorazepam] Anxiety    Hallucinations per patient    Prior to Admission medications   Medication Sig Start Date End Date Taking? Authorizing Provider  cloNIDine (CATAPRES) 0.1 MG tablet TAKE 1 TABLET BY MOUTH TWICE DAILY. 08/04/16  Yes Tresa Garter, MD  propranolol (INDERAL) 40 MG tablet Take 1 tablet (40 mg total) by mouth 2 (two) times daily. 09/23/16  Yes Alfonse Spruce, FNP  escitalopram (LEXAPRO) 10 MG tablet Take 1 tablet (10 mg total) by mouth daily. After 1 week may increase to 2 tablets (20 mg total) by mouth daily Patient not taking: Reported on 10/03/2016 09/23/16   Alfonse Spruce, FNP  famotidine (PEPCID) 20 MG tablet Take 1 tablet (20 mg total) by mouth 2 (two) times daily. Patient not taking: Reported on 09/23/2016 02/24/16   Jeannett Senior, PA-C  HYDROcodone-acetaminophen (NORCO/VICODIN) 5-325 MG tablet Take 1-2 tablets by mouth every 6 hours as needed for pain and/or cough. Patient not taking: Reported on 09/23/2016 05/08/16   Elmyra Ricks Pisciotta, PA-C  hydrOXYzine (ATARAX/VISTARIL) 25 MG tablet Take 1 tablet (25 mg total) by mouth every 6 (six) hours  as needed for anxiety. Patient not taking: Reported on 09/23/2016 05/08/16   Elmyra Ricks Pisciotta, PA-C  hydrOXYzine (VISTARIL) 25 MG capsule Take 1 capsule (25 mg total) by mouth 3 (three) times daily as needed. Patient not taking: Reported on 02/24/2016 06/17/15   Lance Bosch, NP  metoCLOPramide (REGLAN) 10 MG tablet Take 1 tablet (10 mg total) by mouth every 6 (six) hours as needed for nausea. Patient not taking: Reported on 10/03/2016 09/23/16   Alfonse Spruce, FNP  omeprazole (PRILOSEC) 20 MG capsule Take 1 capsule (20 mg total) by mouth daily. Patient not taking: Reported on 09/23/2016 02/24/16   Jeannett Senior, PA-C  promethazine  (PHENERGAN) 25 MG tablet Take 1 tablet (25 mg total) by mouth every 6 (six) hours as needed for nausea or vomiting. Patient not taking: Reported on 09/23/2016 05/08/16   Elmyra Ricks Pisciotta, PA-C  ranitidine (ZANTAC) 150 MG tablet Take 1 tablet (150 mg total) by mouth 2 (two) times daily. Patient not taking: Reported on 10/03/2016 09/23/16   Alfonse Spruce, FNP     Results for orders placed or performed during the hospital encounter of 10/02/16 (from the past 48 hour(s))  Urinalysis, Routine w reflex microscopic     Status: Abnormal   Collection Time: 10/02/16  8:09 PM  Result Value Ref Range   Color, Urine YELLOW YELLOW   APPearance CLOUDY (A) CLEAR   Specific Gravity, Urine 1.008 1.005 - 1.030   pH 5.0 5.0 - 8.0   Glucose, UA 50 (A) NEGATIVE mg/dL   Hgb urine dipstick SMALL (A) NEGATIVE   Bilirubin Urine NEGATIVE NEGATIVE   Ketones, ur NEGATIVE NEGATIVE mg/dL   Protein, ur NEGATIVE NEGATIVE mg/dL   Nitrite NEGATIVE NEGATIVE   Leukocytes, UA MODERATE (A) NEGATIVE   RBC / HPF 0-5 0 - 5 RBC/hpf   WBC, UA TOO NUMEROUS TO COUNT 0 - 5 WBC/hpf   Bacteria, UA NONE SEEN NONE SEEN   Squamous Epithelial / LPF 0-5 (A) NONE SEEN   Mucous PRESENT   Lipase, blood     Status: None   Collection Time: 10/02/16  8:14 PM  Result Value Ref Range   Lipase 38 11 - 51 U/L  Comprehensive metabolic panel     Status: Abnormal   Collection Time: 10/02/16  8:14 PM  Result Value Ref Range   Sodium 140 135 - 145 mmol/L   Potassium 3.3 (L) 3.5 - 5.1 mmol/L   Chloride 102 101 - 111 mmol/L   CO2 22 22 - 32 mmol/L   Glucose, Bld 128 (H) 65 - 99 mg/dL   BUN 6 6 - 20 mg/dL   Creatinine, Ser 0.65 0.44 - 1.00 mg/dL   Calcium 9.2 8.9 - 10.3 mg/dL   Total Protein 7.1 6.5 - 8.1 g/dL   Albumin 3.8 3.5 - 5.0 g/dL   AST 34 15 - 41 U/L   ALT 46 14 - 54 U/L   Alkaline Phosphatase 90 38 - 126 U/L   Total Bilirubin 0.5 0.3 - 1.2 mg/dL   GFR calc non Af Amer >60 >60 mL/min   GFR calc Af Amer >60 >60 mL/min     Comment: (NOTE) The eGFR has been calculated using the CKD EPI equation. This calculation has not been validated in all clinical situations. eGFR's persistently <60 mL/min signify possible Chronic Kidney Disease.    Anion gap 16 (H) 5 - 15  CBC     Status: Abnormal   Collection Time: 10/02/16  8:14 PM  Result  Value Ref Range   WBC 14.0 (H) 4.0 - 10.5 K/uL   RBC 4.46 3.87 - 5.11 MIL/uL   Hemoglobin 15.2 (H) 12.0 - 15.0 g/dL   HCT 43.7 36.0 - 46.0 %   MCV 98.0 78.0 - 100.0 fL   MCH 34.1 (H) 26.0 - 34.0 pg   MCHC 34.8 30.0 - 36.0 g/dL   RDW 13.4 11.5 - 15.5 %   Platelets 222 150 - 400 K/uL  POC urine preg, ED     Status: None   Collection Time: 10/02/16  9:14 PM  Result Value Ref Range   Preg Test, Ur NEGATIVE NEGATIVE    Comment:        THE SENSITIVITY OF THIS METHODOLOGY IS >24 mIU/mL    No results found.  Review of Systems  Constitutional: Negative for weight loss.  HENT: Negative for ear discharge, ear pain, hearing loss and tinnitus.   Eyes: Negative for blurred vision, double vision, photophobia and pain.  Respiratory: Negative for cough, sputum production and shortness of breath.   Cardiovascular: Negative for chest pain.  Gastrointestinal: Positive for abdominal pain, nausea and vomiting.  Genitourinary: Negative for dysuria, flank pain, frequency and urgency.  Musculoskeletal: Negative for back pain, falls, joint pain, myalgias and neck pain.  Neurological: Negative for dizziness, tingling, sensory change, focal weakness, loss of consciousness and headaches.  Endo/Heme/Allergies: Does not bruise/bleed easily.  Psychiatric/Behavioral: Positive for substance abuse. Negative for depression and memory loss. The patient is nervous/anxious.     Blood pressure 143/91, pulse 110, temperature 98.5 F (36.9 C), temperature source Oral, resp. rate 18, height 5' (1.524 m), weight 64.9 kg (143 lb 3 oz), last menstrual period 09/22/2016, SpO2 98 %. Physical Exam  WDWN in  NAD Eyes:  Pupils equal, round; sclera anicteric HENT:  Oral mucosa moist; face - flushed complexion Neck:  No masses palpated, no thyromegaly Lungs:  CTA bilaterally; normal respiratory effort CV:  Regular rate and rhythm; no murmurs; extremities well-perfused with no edema Abd:  +bowel sounds, soft, non-tender, no palpable organomegaly; healed lower midline incision Just below and to the right of the umbilicus - 3 cm palpable mass - tender with deep palpation; unable to completely reduce Skin:  Warm, dry; no sign of jaundice Psychiatric - alert and oriented x 4; anxious mood and affect  Assessment/Plan Incarcerated ventral hernia - probably containing omentum No signs of peritonitis  Will make patient NPO Admit to hospital Surgery later this morning - open repair of incarcerated ventral hernia by Dr. Rosendo Gros.  Maia Petties., MD 10/03/2016, 2:22 AM

## 2016-10-03 NOTE — Transfer of Care (Signed)
Immediate Anesthesia Transfer of Care Note  Patient: [name]  Procedure(s) Performed: Procedure(s): OPEN INCARCERATED HERNIA REPAIR VENTRAL ADULT (N/A)  Patient Location: PACU  Anesthesia Type:General  Level of Consciousness: awake, oriented and patient cooperative  Airway & Oxygen Therapy: Patient Spontanous Breathing and Patient connected to nasal cannula oxygen  Post-op Assessment: Report given to RN, Post -op Vital signs reviewed and stable and Patient moving all extremities  Post vital signs: Reviewed and stable  Last Vitals:  Vitals:   10/03/16 0457 10/03/16 0922  BP: (!) 144/90 (!) 134/106  Pulse: 100 77  Resp: 18 18  Temp: 36.7 C 37.1 C    Last Pain:  Vitals:   10/03/16 0922  TempSrc: Oral  PainSc:       Patients Stated Pain Goal: 2 (10/03/16 0825)  Complications: No apparent anesthesia complications

## 2016-10-03 NOTE — Anesthesia Preprocedure Evaluation (Signed)
Anesthesia Evaluation  Patient identified by MRN, date of birth, ID band Patient awake    Reviewed: Allergy & Precautions, NPO status , Patient's Chart, lab work & pertinent test results  Airway Mallampati: II  TM Distance: >3 FB Neck ROM: Full    Dental no notable dental hx.    Pulmonary Current Smoker,    Pulmonary exam normal breath sounds clear to auscultation       Cardiovascular hypertension, Normal cardiovascular exam Rhythm:Regular Rate:Normal     Neuro/Psych negative neurological ROS  negative psych ROS   GI/Hepatic negative GI ROS, (+) Cirrhosis     substance abuse  cocaine use,   Endo/Other  negative endocrine ROS  Renal/GU negative Renal ROS  negative genitourinary   Musculoskeletal negative musculoskeletal ROS (+)   Abdominal   Peds negative pediatric ROS (+)  Hematology negative hematology ROS (+)   Anesthesia Other Findings   Reproductive/Obstetrics negative OB ROS                             Anesthesia Physical Anesthesia Plan  ASA: III  Anesthesia Plan: General   Post-op Pain Management:    Induction: Intravenous  Airway Management Planned: Oral ETT  Additional Equipment:   Intra-op Plan:   Post-operative Plan: Extubation in OR  Informed Consent: I have reviewed the patients History and Physical, chart, labs and discussed the procedure including the risks, benefits and alternatives for the proposed anesthesia with the patient or authorized representative who has indicated his/her understanding and acceptance.   Dental advisory given  Plan Discussed with: CRNA  Anesthesia Plan Comments: (Informed pt that beause of her chronic cocaine use she is at increased risk for CVA and or MI)        Anesthesia Quick Evaluation

## 2016-10-03 NOTE — Anesthesia Procedure Notes (Signed)
Procedure Name: Intubation Date/Time: 10/03/2016 10:26 AM Performed by: Melina Copa, Marlita Keil R Pre-anesthesia Checklist: Patient identified, Emergency Drugs available, Suction available and Patient being monitored Patient Re-evaluated:Patient Re-evaluated prior to inductionOxygen Delivery Method: Circle System Utilized Preoxygenation: Pre-oxygenation with 100% oxygen Intubation Type: IV induction and Cricoid Pressure applied Ventilation: Mask ventilation without difficulty Laryngoscope Size: Mac and 3 Grade View: Grade I Tube type: Oral Number of attempts: 1 Airway Equipment and Method: Stylet Placement Confirmation: ETT inserted through vocal cords under direct vision,  positive ETCO2 and breath sounds checked- equal and bilateral Secured at: 20 cm Tube secured with: Tape Dental Injury: Teeth and Oropharynx as per pre-operative assessment

## 2016-10-04 DIAGNOSIS — K439 Ventral hernia without obstruction or gangrene: Secondary | ICD-10-CM | POA: Diagnosis present

## 2016-10-04 MED ORDER — HYDROCODONE-ACETAMINOPHEN 5-325 MG PO TABS: 1.0000 | ORAL_TABLET | Freq: Four times a day (QID) | ORAL | 0 refills | Status: DC | PRN

## 2016-10-04 NOTE — Discharge Instructions (Signed)
[facility] _______[facility] ° °UMBILICAL OR INGUINAL HERNIA REPAIR: POST OP INSTRUCTIONS ° °Always review your discharge instruction sheet given to you by the facility where your surgery was performed. °IF YOU HAVE DISABILITY OR FAMILY LEAVE FORMS, YOU MUST BRING THEM TO THE OFFICE FOR PROCESSING.   °DO NOT GIVE THEM TO YOUR DOCTOR. ° °1. A  prescription for pain medication may be given to you upon discharge.  Take your pain medication as prescribed, if needed.  If narcotic pain medicine is not needed, then you may take acetaminophen (Tylenol) or ibuprofen (Advil) as needed. °2. Take your usually prescribed medications unless otherwise directed. °If you need a refill on your pain medication, please contact your pharmacy.  They will contact our office to request authorization. Prescriptions will not be filled after 5 pm or on week-ends. °3. You should follow a light diet the first 24 hours after arrival home, such as soup and crackers, etc.  Be sure to include lots of fluids daily.  Resume your normal diet the day after surgery. °4.Most patients will experience some swelling and bruising around the umbilicus or in the groin and scrotum.  Ice packs and reclining will help.  Swelling and bruising can take several days to resolve.  °6. It is common to experience some constipation if taking pain medication after surgery.  Increasing fluid intake and taking a stool softener (such as Colace) will usually help or prevent this problem from occurring.  A mild laxative (Milk of Magnesia or Miralax) should be taken according to package directions if there are no bowel movements after 48 hours. °7. Unless discharge instructions indicate otherwise, you may remove your bandages 24-48 hours after surgery, and you may shower at that time.  You may have steri-strips (small skin tapes) in place directly over the incision.  These strips should be left on the skin for 7-10 days.  If your surgeon used skin glue on the  incision, you may shower in 24 hours.  The glue will flake off over the next 2-3 weeks.  Any sutures or staples will be removed at the office during your follow-up visit. °8. ACTIVITIES:  You may resume regular (light) daily activities beginning the next day--such as daily self-care, walking, climbing stairs--gradually increasing activities as tolerated.  You may have sexual intercourse when it is comfortable.  Refrain from any heavy lifting or straining until approved by your doctor. ° °a.You may drive when you are no longer taking prescription pain medication, you can comfortably wear a seatbelt, and you can safely maneuver your car and apply brakes. °b.RETURN TO WORK:   °_____________________________________________ ° °9.You should see your doctor in the office for a follow-up appointment approximately 2-3 weeks after your surgery.  Make sure that you call for this appointment within a day or two after you arrive home to insure a convenient appointment time. °10.OTHER INSTRUCTIONS: _________________________ °   _____________________________________ ° °WHEN TO CALL YOUR DOCTOR: °1. Fever over 101.0 °2. Inability to urinate °3. Nausea and/or vomiting °4. Extreme swelling or bruising °5. Continued bleeding from incision. °6. Increased pain, redness, or drainage from the incision ° °The clinic staff is available to answer your questions during regular business hours.  Please don’t hesitate to call and ask to speak to one of the nurses for clinical concerns.  If you have a medical emergency, go to the nearest emergency room or call 911.  A surgeon from [facility] is always on call at the hospital ° ° °  [address], [address], [place], [place]  [address] ? ° [address] [address], [place], [place]   [address] °[phone] ? [phone] ? FAX [phone] °Web site: [email or web address] °

## 2016-10-04 NOTE — Progress Notes (Signed)
1 Day Post-Op  Subjective: Pt sitting in bed    Eating breakfast   Objective: Vital signs in last 24 hours: Temp:  [97.5 F (36.4 C)-98.8 F (37.1 C)] 98.3 F (36.8 C) (02/11 0548) Pulse Rate:  [63-82] 66 (02/11 0548) Resp:  [13-18] 17 (02/11 0548) BP: (127-148)/(66-107) 143/66 (02/11 0548) SpO2:  [96 %-100 %] 100 % (02/11 0548) Last BM Date: 10/02/16  Intake/Output from previous day: 02/10 0701 - 02/11 0700 In: 4584.7 [P.O.:1470; I.V.:3114.7] Out: 1410 [Urine:1400; Blood:10] Intake/Output this shift: No intake/output data recorded.  Incision/Wound:glue in place   CDI   Lab Results:   Recent Labs  10/02/16 2014  WBC 14.0*  HGB 15.2*  HCT 43.7  PLT 222   BMET  Recent Labs  10/02/16 2014  NA 140  K 3.3*  CL 102  CO2 22  GLUCOSE 128*  BUN 6  CREATININE 0.65  CALCIUM 9.2   PT/INR No results for input(s): LABPROT, INR in the last 72 hours. ABG No results for input(s): PHART, HCO3 in the last 72 hours.  Invalid input(s): PCO2, PO2  Studies/Results: No results found.  Anti-infectives: Anti-infectives    Start     Dose/Rate Route Frequency Ordered Stop   10/03/16 1015  vancomycin (VANCOCIN) IVPB 1000 mg/200 mL premix     1,000 mg 200 mL/hr over 60 Minutes Intravenous To ShortStay Surgical 10/03/16 0325 10/04/16 1015      Assessment/Plan: s/p Procedure(s): OPEN INCARCERATED HERNIA REPAIR VENTRAL ADULT (N/A) Discharge  LOS: 1 day    [name] A. 10/04/2016

## 2016-10-04 NOTE — Discharge Summary (Signed)
Physician Discharge Summary  Patient ID: [name] MRN: [id number] DOB/AGE: [birth year] 33 y.o.  Admit date: 10/02/2016 Discharge date: 10/04/2016  Admission Diagnoses:ventral hernia incarcerated   Discharge Diagnoses:  Active Problems:   Incarcerated ventral hernia   Ventral hernia   Discharged Condition: good  Hospital Course: Pt did well after hernia repair.  Diet advanced, pain controlled achieved and wound clean.     Significant Diagnostic Studies: none   Treatments: surgery: repair ventral hernia   Discharge Exam: Blood pressure (!) 143/66, pulse 66, temperature 98.3 F (36.8 C), temperature source Oral, resp. rate 17, height 5' (1.524 m), weight 64.9 kg (143 lb 3 oz), last menstrual period 09/22/2016, SpO2 100 %. General appearance: alert and cooperative Resp: clear to auscultation bilaterally Incision/Wound: CDI   Disposition: 01-Home or Self Care  Discharge Instructions    Diet - low sodium heart healthy    Complete by:  As directed    Increase activity slowly    Complete by:  As directed      Allergies as of 10/04/2016      Reactions   Ibuprofen Anaphylaxis   Patient thinks it may have caused her throat to swell   Acetaminophen    Causes  patient to have an upset stomach   Codeine Hives, Itching   Lisinopril Swelling   Penicillins Hives, Swelling   Has patient had a PCN reaction causing immediate rash, facial/tongue/throat swelling, SOB or lightheadedness with hypotension: Yes Has patient had a PCN reaction causing severe rash involving mucus membranes or skin necrosis: unknown Has patient had a PCN reaction that required hospitalization No Has patient had a PCN reaction occurring within the last 10 years: No If all of the above answers are "NO", then may proceed with Cephalosporin use.   Ativan [lorazepam] Anxiety   Hallucinations per patient      Medication List    STOP taking these medications   metoCLOPramide 10 MG tablet Commonly known  as:  REGLAN   ranitidine 150 MG tablet Commonly known as:  ZANTAC     TAKE these medications   cloNIDine 0.1 MG tablet Commonly known as:  CATAPRES TAKE 1 TABLET BY MOUTH TWICE DAILY.   escitalopram 10 MG tablet Commonly known as:  LEXAPRO Take 1 tablet (10 mg total) by mouth daily. After 1 week may increase to 2 tablets (20 mg total) by mouth daily   famotidine 20 MG tablet Commonly known as:  PEPCID Take 1 tablet (20 mg total) by mouth 2 (two) times daily.   HYDROcodone-acetaminophen 5-325 MG tablet Commonly known as:  NORCO Take 1-2 tablets by mouth every 6 (six) hours as needed for moderate pain. What changed:  how much to take  how to take this  when to take this  reasons to take this  additional instructions   hydrOXYzine 25 MG capsule Commonly known as:  VISTARIL Take 1 capsule (25 mg total) by mouth 3 (three) times daily as needed.   hydrOXYzine 25 MG tablet Commonly known as:  ATARAX/VISTARIL Take 1 tablet (25 mg total) by mouth every 6 (six) hours as needed for anxiety.   omeprazole 20 MG capsule Commonly known as:  PRILOSEC Take 1 capsule (20 mg total) by mouth daily.   promethazine 25 MG tablet Commonly known as:  PHENERGAN Take 1 tablet (25 mg total) by mouth every 6 (six) hours as needed for nausea or vomiting.   propranolol 40 MG tablet Commonly known as:  INDERAL Take 1 tablet (40 mg  total) by mouth 2 (two) times daily.      Follow-up Information    [facility] Follow up in 2 week(s).   Specialty:  General Surgery Why:  Please call monday for appointment in 2 weeks  Contact information: [address] [place] [address] [phone]          Signed: Harriette Bouillon A. 10/04/2016, 9:01 AM

## 2016-10-04 NOTE — Discharge Planning (Signed)
Patient discharged in stable condition. Verbalizes understanding of all discharge instructions, including home medications and follow up appointments.

## 2016-10-05 ENCOUNTER — Encounter (HOSPITAL_COMMUNITY): Payer: Self-pay | Admitting: General Surgery

## 2016-10-05 NOTE — Telephone Encounter (Signed)
Medical Assistant left message on patient's home and cell voicemail. Voicemail states to give a call back to [place] with [facility] at [phone].  !!!Please inform patient of labs being normal. Advise patient to avoid fatty foods and alcohol due to history of fatty liver!!!

## 2016-10-05 NOTE — Telephone Encounter (Signed)
-----   Message from Lizbeth Bark, [place] sent at 09/29/2016  5:20 PM EST ----- Microalbumin/creatinine ratio level was normal. This tests for protein in your urine that can indicate early signs of kidney damage.  Labs were normal. Kidney function normal. Due to your history of fatty liver disease.  Avoid alcohol and Tylenol. Begin eating a diet lower in fat. Decrease your intake of fried foods and whole milk.

## 2016-10-09 ENCOUNTER — Ambulatory Visit (HOSPITAL_COMMUNITY)
Admission: RE | Admit: 2016-10-09 | Discharge: 2016-10-09 | Disposition: A | Discharge status: Home / Self Care | Payer: Self-pay | Source: Ambulatory Visit | Attending: Family Medicine | Admitting: Family Medicine

## 2016-10-09 DIAGNOSIS — R109 Unspecified abdominal pain: Secondary | ICD-10-CM | POA: Insufficient documentation

## 2016-10-09 DIAGNOSIS — K760 Fatty (change of) liver, not elsewhere classified: Secondary | ICD-10-CM | POA: Insufficient documentation

## 2016-10-09 DIAGNOSIS — R112 Nausea with vomiting, unspecified: Secondary | ICD-10-CM | POA: Insufficient documentation

## 2016-10-12 ENCOUNTER — Telehealth: Payer: Self-pay | Admitting: Family Medicine

## 2016-10-12 NOTE — Telephone Encounter (Signed)
Patient called the office to check on the status of her ultrasound results. Please follow up.  Thank you.

## 2016-10-13 NOTE — Telephone Encounter (Signed)
Patient called the office to check on the status of her ultrasound results. Please advice.

## 2016-10-14 NOTE — Telephone Encounter (Signed)
CMA call to inform abdominal ultrasound   Patient did not answer  CMA left a VM stating the reason of the call and to call us back

## 2016-10-14 NOTE — Telephone Encounter (Signed)
Please notify patient that results of abdominal ultrasound showed no gallstones, kidney stones, or enlargement of the aorta which is a blood major blood vessel. It did show fatty liver. Start eating a diet low in saturated and trans fats. Avoid alcohol and products containing acetaminophen like Tylenol.

## 2016-10-15 NOTE — Telephone Encounter (Signed)
CMA call to go over lab results  CMA spoke with boyfriend, CMA let him know that I was calling to go over lab results & to call me back

## 2016-10-15 NOTE — Telephone Encounter (Signed)
-----   Message from Lizbeth Bark, [place] sent at 10/15/2016 12:19 PM EST ----- -Abdominal ultrasound showed no gallstones, kidney stones, or enlargement of the aorta which is a blood major blood vessel. It did show fatty liver.  -Start eating a diet low in saturated and trans fats. Avoid alcohol and products containing acetaminophen like Tylenol.

## 2016-10-15 NOTE — Telephone Encounter (Signed)
Patient return CMA call  Patient verify DOB   Patient was aware and understood  

## 2016-11-30 MED FILL — ?ESCITALOPRAM 10 MG TABLET: 10 | 30 days supply | Qty: 60 | Fill #0

## 2016-11-30 MED FILL — PROPRANOLOL 40 MG TABLET: 40 | 30 days supply | Qty: 60 | Fill #0

## 2016-11-30 MED FILL — ?CLONIDINE HCL 0.1 MG TABL: 0.1 | 30 days supply | Qty: 60 | Fill #2

## 2017-01-25 NOTE — Addendum Note (Signed)
Addendum  created 01/25/17 1101 by Eilene Ghazi, MD   Sign clinical note

## 2017-02-23 ENCOUNTER — Emergency Department (HOSPITAL_COMMUNITY): Payer: Self-pay

## 2017-02-23 ENCOUNTER — Encounter (HOSPITAL_COMMUNITY): Payer: Self-pay

## 2017-02-23 ENCOUNTER — Emergency Department (HOSPITAL_COMMUNITY)
Admission: EM | Admit: 2017-02-23 | Discharge: 2017-02-23 | Disposition: A | Discharge status: Home / Self Care | Payer: Self-pay | Attending: Emergency Medicine | Admitting: Emergency Medicine

## 2017-02-23 DIAGNOSIS — Y929 Unspecified place or not applicable: Secondary | ICD-10-CM | POA: Insufficient documentation

## 2017-02-23 DIAGNOSIS — Z760 Encounter for issue of repeat prescription: Secondary | ICD-10-CM | POA: Insufficient documentation

## 2017-02-23 DIAGNOSIS — F17210 Nicotine dependence, cigarettes, uncomplicated: Secondary | ICD-10-CM | POA: Insufficient documentation

## 2017-02-23 DIAGNOSIS — W1789XA Other fall from one level to another, initial encounter: Secondary | ICD-10-CM | POA: Insufficient documentation

## 2017-02-23 DIAGNOSIS — Y9389 Activity, other specified: Secondary | ICD-10-CM | POA: Insufficient documentation

## 2017-02-23 DIAGNOSIS — Y998 Other external cause status: Secondary | ICD-10-CM | POA: Insufficient documentation

## 2017-02-23 DIAGNOSIS — Z91010 Allergy to peanuts: Secondary | ICD-10-CM | POA: Insufficient documentation

## 2017-02-23 DIAGNOSIS — S93602A Unspecified sprain of left foot, initial encounter: Secondary | ICD-10-CM | POA: Insufficient documentation

## 2017-02-23 DIAGNOSIS — F419 Anxiety disorder, unspecified: Secondary | ICD-10-CM | POA: Insufficient documentation

## 2017-02-23 DIAGNOSIS — Z79899 Other long term (current) drug therapy: Secondary | ICD-10-CM | POA: Insufficient documentation

## 2017-02-23 DIAGNOSIS — I10 Essential (primary) hypertension: Secondary | ICD-10-CM | POA: Insufficient documentation

## 2017-02-23 MED ORDER — PROPRANOLOL HCL 40 MG PO TABS
40.0000 mg | ORAL_TABLET | Freq: Once | ORAL | Status: AC
  Administered 2017-02-23: 40 mg via ORAL
  Filled 2017-02-23: qty 1

## 2017-02-23 MED ORDER — CLONIDINE HCL 0.1 MG PO TABS
0.1000 mg | ORAL_TABLET | Freq: Two times a day (BID) | ORAL | 0 refills | Status: DC
Start: 2017-02-23 — End: 2017-03-05

## 2017-02-23 MED ORDER — ACETAMINOPHEN 500 MG PO TABS
1000.0000 mg | ORAL_TABLET | Freq: Once | ORAL | Status: AC
  Administered 2017-02-23: 1000 mg via ORAL
  Filled 2017-02-23: qty 2

## 2017-02-23 MED ORDER — ALPRAZOLAM 0.25 MG PO TABS
0.5000 mg | ORAL_TABLET | Freq: Once | ORAL | Status: AC
  Administered 2017-02-23: 0.5 mg via ORAL
  Filled 2017-02-23: qty 2

## 2017-02-23 MED ORDER — PROPRANOLOL HCL 40 MG PO TABS
40.0000 mg | ORAL_TABLET | Freq: Two times a day (BID) | ORAL | 0 refills | Status: DC
Start: 2017-02-23 — End: 2017-03-05

## 2017-02-23 MED ORDER — CLONIDINE HCL 0.1 MG PO TABS
0.1000 mg | ORAL_TABLET | Freq: Once | ORAL | Status: AC
  Administered 2017-02-23: 0.1 mg via ORAL
  Filled 2017-02-23: qty 1

## 2017-02-23 NOTE — ED Notes (Signed)
PT states understanding of care given, follow up care, and medication prescribed. PT ambulated from ED to car with a steady gait. 

## 2017-02-23 NOTE — Progress Notes (Signed)
Orthopedic Tech Progress Note Patient Details:  [name] 1986/04/25 [id number]  Ortho Devices Type of Ortho Device: Postop shoe/boot Ortho Device/Splint Location: LLE Ortho Device/Splint Interventions: Ordered, Application   [name] 02/23/2017, 10:15 PM

## 2017-02-23 NOTE — ED Notes (Addendum)
Pt states haven't had any meds in over 6  Days ran out and couldn't get a refill on all of my meds.

## 2017-02-23 NOTE — ED Triage Notes (Signed)
Pt states she fell off a truck 2 weeks ago and injured her left ankle. Foot noted to be swollen. Pt also states she needs her HTN meds refilled. She also reports having anxiety due to being in the hospital.

## 2017-02-23 NOTE — ED Provider Notes (Signed)
[facility]-EMERGENCY DEPT Provider Note   CSN: [id number] Arrival date & time: 02/23/17  1819     History   Chief Complaint Chief Complaint  Patient presents with  . Foot Pain    HPI [name] is a 31 y.o. female.  31 year old female with past medical history including anxiety, hypertension who presents with left foot pain. 2 weeks ago, the patient was stepping off of a tractor trailer truck and missed her step, rolling her feet forward and straining the top of her left foot. She reports severe, constant pain in her dorsal left foot and behind her middle 3 toes. Pain worse with ambulation. She also requests refill of HTN medications. She also notes that she is very anxious.   The history is provided by the patient.  Foot Pain     Past Medical History:  Diagnosis Date  . Anxiety   . Cirrhosis of liver (HCC)   . Hypertension   . Renal disorder    Kidney Infection     Patient Active Problem List   Diagnosis Date Noted  . Ventral hernia 10/04/2016  . Incarcerated ventral hernia 10/03/2016  . Anxiety 02/15/2014  . Hypertension 02/15/2014    Past Surgical History:  Procedure Laterality Date  . ARTERY REPAIR    . CESAREAN SECTION     x 5  . MOUTH SURGERY    . TEAR DUCT PROBING    . TUBAL LIGATION    . VENTRAL HERNIA REPAIR N/A 10/03/2016   Procedure: OPEN INCARCERATED HERNIA REPAIR VENTRAL ADULT;  Surgeon: Axel Filler, MD;  Location: [facility] OR;  Service: General;  Laterality: N/A;    OB History    No data available       Home Medications    Prior to Admission medications   Medication Sig Start Date End Date Taking? Authorizing Provider  cloNIDine (CATAPRES) 0.1 MG tablet TAKE 1 TABLET BY MOUTH TWICE DAILY. 08/04/16   Quentin Angst, MD  escitalopram (LEXAPRO) 10 MG tablet Take 1 tablet (10 mg total) by mouth daily. After 1 week may increase to 2 tablets (20 mg total) by mouth daily Patient not taking: Reported on 10/03/2016 09/23/16   Lizbeth Bark, FNP  famotidine (PEPCID) 20 MG tablet Take 1 tablet (20 mg total) by mouth 2 (two) times daily. Patient not taking: Reported on 09/23/2016 02/24/16   Jaynie Crumble, PA-C  HYDROcodone-acetaminophen (NORCO) 5-325 MG tablet Take 1-2 tablets by mouth every 6 (six) hours as needed for moderate pain. 10/04/16   Harriette Bouillon, MD  hydrOXYzine (ATARAX/VISTARIL) 25 MG tablet Take 1 tablet (25 mg total) by mouth every 6 (six) hours as needed for anxiety. Patient not taking: Reported on 09/23/2016 05/08/16   Pisciotta, Joni Reining, PA-C  hydrOXYzine (VISTARIL) 25 MG capsule Take 1 capsule (25 mg total) by mouth 3 (three) times daily as needed. Patient not taking: Reported on 02/24/2016 06/17/15   Ambrose Finland, NP  omeprazole (PRILOSEC) 20 MG capsule Take 1 capsule (20 mg total) by mouth daily. Patient not taking: Reported on 09/23/2016 02/24/16   Jaynie Crumble, PA-C  promethazine (PHENERGAN) 25 MG tablet Take 1 tablet (25 mg total) by mouth every 6 (six) hours as needed for nausea or vomiting. Patient not taking: Reported on 09/23/2016 05/08/16   Pisciotta, Joni Reining, PA-C  propranolol (INDERAL) 40 MG tablet Take 1 tablet (40 mg total) by mouth 2 (two) times daily. 09/23/16   Lizbeth Bark, FNP    Family History Family History  Problem  Relation Age of Onset  . CAD Other   . Diabetes Other   . Hypertension Other   . Cancer Other   . Thyroid disease Other     Social History Social History  Substance Use Topics  . Smoking status: Current Every Day Smoker    Packs/day: 0.50    Types: Cigarettes  . Smokeless tobacco: Never Used  . Alcohol use Yes     Comment: 4 0r 5 fourty oz per day     Allergies   Ibuprofen; Peanut-containing drug products; Acetaminophen; Codeine; Lisinopril; Penicillins; and Ativan [lorazepam]   Review of Systems Review of Systems All other systems reviewed and are negative except that which was mentioned in HPI   Physical Exam Updated Vital Signs BP  (!) 142/108   Pulse 100   Temp 98.3 F (36.8 C) (Oral)   Resp 17   LMP 01/30/2017 (Within Days)   SpO2 95%   Physical Exam  Constitutional: She is oriented to person, place, and time. She appears well-developed and well-nourished. No distress.  HENT:  Head: Normocephalic and atraumatic.  Eyes: Conjunctivae are normal.  Neck: Neck supple.  Cardiovascular: Intact distal pulses.   Musculoskeletal: She exhibits edema and tenderness.  Mild edema and tenderness dorsal L foot, no medial/lateral malleolus tenderness, normal ROM at ankle  Neurological: She is alert and oriented to person, place, and time.  Skin: Skin is warm and dry.  No ecchymoses  Psychiatric:  Anxious, jittery  Nursing note and vitals reviewed.    ED Treatments / Results  Labs (all labs ordered are listed, but only abnormal results are displayed) Labs Reviewed - No data to display  EKG  EKG Interpretation None       Radiology Dg Ankle Complete Left  Result Date: 02/23/2017 CLINICAL DATA:  Fall off truck 2 weeks ago.  Left ankle injury. EXAM: LEFT ANKLE COMPLETE - 3+ VIEW COMPARISON:  None. FINDINGS: Mild lateral soft tissue swelling. No underlying bony abnormality. No fracture, subluxation or dislocation. Joint spaces maintained. IMPRESSION: No acute bony abnormality. Electronically Signed   By: Charlett Nose M.D.   On: 02/23/2017 19:19   Dg Foot Complete Left  Result Date: 02/23/2017 CLINICAL DATA:  Fall off truck 2 weeks ago. EXAM: LEFT FOOT - COMPLETE 3+ VIEW COMPARISON:  None. FINDINGS: Soft tissue swelling along the dorsum of the foot. No acute bony abnormality. Specifically, no fracture, subluxation, or dislocation. Soft tissues are intact. IMPRESSION: No acute bony abnormality. Electronically Signed   By: Charlett Nose M.D.   On: 02/23/2017 19:19    Procedures Procedures (including critical care time)  Medications Ordered in ED Medications  propranolol (INDERAL) tablet 40 mg (not administered)    cloNIDine (CATAPRES) tablet 0.1 mg (not administered)  ALPRAZolam (XANAX) tablet 0.5 mg (not administered)     Initial Impression / Assessment and Plan / ED Course  I have reviewed the triage vital signs and the nursing notes.  Pertinent imaging results that were available during my care of the patient were reviewed by me and considered in my medical decision making (see chart for details).     2 weeks of foot pain after foot injury. Neurovascularly intact, mild dorsal foot swelling but no deformity and no evidence of infection. Plain films of foot and ankle negative acute. Gave post-op shoe and Discussed supportive measures. Regarding her hypertension, provided with one refill of her medications but instructed to [facility] for any further refills.   Final  Clinical Impressions(s) / ED Diagnoses   Final diagnoses:  None    New Prescriptions New Prescriptions   No medications on file     Little, Ambrose Finland, MD 02/23/17 2149

## 2017-03-05 ENCOUNTER — Encounter (HOSPITAL_BASED_OUTPATIENT_CLINIC_OR_DEPARTMENT_OTHER): Payer: Self-pay | Admitting: *Deleted

## 2017-03-05 ENCOUNTER — Emergency Department (HOSPITAL_BASED_OUTPATIENT_CLINIC_OR_DEPARTMENT_OTHER): Payer: Self-pay

## 2017-03-05 ENCOUNTER — Emergency Department (HOSPITAL_BASED_OUTPATIENT_CLINIC_OR_DEPARTMENT_OTHER)
Admission: EM | Admit: 2017-03-05 | Discharge: 2017-03-05 | Disposition: A | Discharge status: Home / Self Care | Payer: Self-pay | Attending: Physician Assistant | Admitting: Physician Assistant

## 2017-03-05 DIAGNOSIS — N83201 Unspecified ovarian cyst, right side: Secondary | ICD-10-CM

## 2017-03-05 DIAGNOSIS — Z91010 Allergy to peanuts: Secondary | ICD-10-CM | POA: Insufficient documentation

## 2017-03-05 DIAGNOSIS — F17210 Nicotine dependence, cigarettes, uncomplicated: Secondary | ICD-10-CM | POA: Insufficient documentation

## 2017-03-05 DIAGNOSIS — I10 Essential (primary) hypertension: Secondary | ICD-10-CM | POA: Insufficient documentation

## 2017-03-05 DIAGNOSIS — K709 Alcoholic liver disease, unspecified: Secondary | ICD-10-CM

## 2017-03-05 DIAGNOSIS — N3000 Acute cystitis without hematuria: Secondary | ICD-10-CM

## 2017-03-05 DIAGNOSIS — Z79899 Other long term (current) drug therapy: Secondary | ICD-10-CM | POA: Insufficient documentation

## 2017-03-05 DIAGNOSIS — R1031 Right lower quadrant pain: Secondary | ICD-10-CM | POA: Insufficient documentation

## 2017-03-05 HISTORY — DX: Other psychoactive substance abuse, uncomplicated: F19.10

## 2017-03-05 LAB — LIPASE, BLOOD: LIPASE: 32 U/L (ref 11–51)

## 2017-03-05 LAB — URINALYSIS, MICROSCOPIC (REFLEX)

## 2017-03-05 LAB — COMPREHENSIVE METABOLIC PANEL
ALBUMIN: 4.4 g/dL (ref 3.5–5.0)
ALT: 151 U/L — ABNORMAL HIGH (ref 14–54)
ANION GAP: 14 (ref 5–15)
AST: 128 U/L — AB (ref 15–41)
Alkaline Phosphatase: 97 U/L (ref 38–126)
BUN: 7 mg/dL (ref 6–20)
CHLORIDE: 97 mmol/L — AB (ref 101–111)
CO2: 25 mmol/L (ref 22–32)
Calcium: 9.8 mg/dL (ref 8.9–10.3)
Creatinine, Ser: 0.65 mg/dL (ref 0.44–1.00)
GFR calc Af Amer: >60 mL/min (ref >60)
Glucose, Bld: 133 mg/dL — ABNORMAL HIGH (ref 65–99)
POTASSIUM: 3.3 mmol/L — AB (ref 3.5–5.1)
Sodium: 136 mmol/L (ref 135–145)
Total Bilirubin: 0.5 mg/dL (ref 0.3–1.2)
Total Protein: 8.1 g/dL (ref 6.5–8.1)

## 2017-03-05 LAB — URINALYSIS, ROUTINE W REFLEX MICROSCOPIC
Bilirubin Urine: NEGATIVE
Glucose, UA: NEGATIVE mg/dL
Hgb urine dipstick: NEGATIVE
KETONES UR: NEGATIVE mg/dL
Nitrite: NEGATIVE
PROTEIN: NEGATIVE mg/dL
Specific Gravity, Urine: 1.013 (ref 1.005–1.030)
pH: 5.5 (ref 5.0–8.0)

## 2017-03-05 LAB — CBC WITH DIFFERENTIAL/PLATELET
BASOS ABS: 0 10*3/uL (ref 0.0–0.1)
Basophils Relative: 0 %
EOS PCT: 1 %
Eosinophils Absolute: 0.2 10*3/uL (ref 0.0–0.7)
HEMATOCRIT: 42.9 % (ref 36.0–46.0)
HEMOGLOBIN: 15.1 g/dL — AB (ref 12.0–15.0)
LYMPHS ABS: 1.2 10*3/uL (ref 0.7–4.0)
LYMPHS PCT: 10 %
MCH: 33.3 pg (ref 26.0–34.0)
MCHC: 35.2 g/dL (ref 30.0–36.0)
MCV: 94.7 fL (ref 78.0–100.0)
Monocytes Absolute: 0.9 10*3/uL (ref 0.1–1.0)
Monocytes Relative: 7 %
NEUTROS ABS: 10.3 10*3/uL — AB (ref 1.7–7.7)
NEUTROS PCT: 82 %
Platelets: 230 10*3/uL (ref 150–400)
RBC: 4.53 MIL/uL (ref 3.87–5.11)
RDW: 13.9 % (ref 11.5–15.5)
WBC: 12.6 10*3/uL — AB (ref 4.0–10.5)

## 2017-03-05 LAB — PREGNANCY, URINE: PREG TEST UR: NEGATIVE

## 2017-03-05 LAB — ETHANOL: Alcohol, Ethyl (B): 16 mg/dL — ABNORMAL HIGH (ref <5)

## 2017-03-05 MED ORDER — PROPRANOLOL HCL 40 MG PO TABS: 40.0000 mg | ORAL_TABLET | Freq: Two times a day (BID) | ORAL | 0 refills | Status: DC

## 2017-03-05 MED ORDER — DEXTROSE 5 % IV SOLN
1.0000 g | Freq: Once | INTRAVENOUS | Status: AC
  Administered 2017-03-05: 1 g via INTRAVENOUS
  Filled 2017-03-05: qty 10

## 2017-03-05 MED ORDER — HYDROMORPHONE HCL 1 MG/ML IJ SOLN
0.5000 mg | Freq: Once | INTRAMUSCULAR | Status: AC
  Administered 2017-03-05: 0.5 mg via INTRAVENOUS
  Filled 2017-03-05: qty 1

## 2017-03-05 MED ORDER — CLONIDINE HCL 0.1 MG PO TABS: 0.1000 mg | ORAL_TABLET | Freq: Two times a day (BID) | ORAL | 0 refills | Status: DC

## 2017-03-05 MED ORDER — OXYCODONE HCL 5 MG PO TABS: 2.5000 mg | ORAL_TABLET | ORAL | 0 refills | Status: DC | PRN

## 2017-03-05 MED ORDER — ONDANSETRON HCL 4 MG/2ML IJ SOLN
4.0000 mg | Freq: Once | INTRAMUSCULAR | Status: AC | PRN
  Administered 2017-03-05: 4 mg via INTRAVENOUS
  Filled 2017-03-05: qty 2

## 2017-03-05 MED ORDER — CEPHALEXIN 500 MG PO CAPS: ORAL_CAPSULE | ORAL | 0 refills | Status: DC

## 2017-03-05 MED ORDER — ONDANSETRON HCL 4 MG PO TABS: 4.0000 mg | ORAL_TABLET | Freq: Three times a day (TID) | ORAL | 0 refills | Status: DC | PRN

## 2017-03-05 NOTE — ED Triage Notes (Signed)
Abdominal pain since 3am. Hx liver cirrhosis. Vomiting.

## 2017-03-05 NOTE — Discharge Instructions (Signed)
Many ovarian cysts do not cause symptoms. If symptoms are present, they may include: Pelvic pain or pressure. Pain in the lower abdomen. Pain during sex. Abdominal swelling. Abnormal menstrual periods. Increasing pain with menstrual periods.  How is this diagnosed? These cysts are commonly found during a routine pelvic exam. You may have tests to find out more about the cyst, such as: Ultrasound. X-ray of the pelvis. CT scan. MRI. Blood tests.  How is this treated? Many ovarian cysts go away on their own without treatment. Your health care provider may want to check your cyst regularly for 2-3 months to see if it changes. If you are in menopause, it is especially important to have your cyst monitored closely because menopausal women have a higher rate of ovarian cancer. When treatment is needed, it may include: Medicines to help relieve pain. A procedure to drain the cyst (aspiration). Surgery to remove the whole cyst. Hormone treatment or birth control pills. These methods are sometimes used to help dissolve a cyst.  Follow these instructions at home: Take over-the-counter and prescription medicines only as told by your health care provider. Do not drive or use heavy machinery while taking prescription pain medicine. Get regular pelvic exams and Pap tests as often as told by your health care provider. Return to your normal activities as told by your health care provider. Ask your health care provider what activities are safe for you. Do not use any products that contain nicotine or tobacco, such as cigarettes and e-cigarettes. If you need help quitting, ask your health care provider. Keep all follow-up visits as told by your health care provider. This is important. Contact a health care provider if: Your periods are late, irregular, or painful, or they stop. You have pelvic pain that does not go away. You have pressure on your bladder or trouble emptying your bladder  completely. You have pain during sex. You have any of the following in your abdomen: A feeling of fullness. Pressure. Discomfort. Pain that does not go away. Swelling. You feel generally ill. You become constipated. You lose your appetite. You develop severe acne. You start to have more body hair and facial hair. You are gaining weight or losing weight without changing your exercise and eating habits. You think you may be pregnant. Get help right away if: You have abdominal pain that is severe or gets worse. You cannot eat or drink without vomiting. You suddenly develop a fever. Your menstrual period is much heavier than usual.

## 2017-03-05 NOTE — ED Provider Notes (Signed)
[facility] Provider Note   CSN: [id number] Arrival date & time: 03/05/17  1710  By signing my name below, I, Rosana Fret, attest that this documentation has been prepared under the direction and in the presence of Arthor Captain, PA-C.  Electronically Signed: Rosana Fret, ED Scribe. 03/05/17. 6:54 PM.  History   Chief Complaint Chief Complaint  Patient presents with  . Abdominal Pain  . Emesis   The history is provided by the patient. No language interpreter was used.   HPI Comments: [name] is a 31 y.o. female with a PMHx of liver cirrhosis, who presents to the Emergency Department complaining of sudden onset, severe RLQ abdominal pain onset this morning. Pt describes pain as as radiating into her right groin and right lower back. Pt reports associated vomiting. Pt's pain is exacerbated by urination. Pt has a Fhx of kidney stones. Pt endorses heavy EtOH use each day and tobacco use. No other complaints at this time.  Past Medical History:  Diagnosis Date  . Anxiety   . Cirrhosis of liver (HCC)   . Hypertension   . Polysubstance abuse   . Renal disorder    Kidney Infection     Patient Active Problem List   Diagnosis Date Noted  . Ventral hernia 10/04/2016  . Incarcerated ventral hernia 10/03/2016  . Anxiety 02/15/2014  . Hypertension 02/15/2014    Past Surgical History:  Procedure Laterality Date  . ARTERY REPAIR    . CESAREAN SECTION     x 5  . MOUTH SURGERY    . TEAR DUCT PROBING    . TUBAL LIGATION    . VENTRAL HERNIA REPAIR N/A 10/03/2016   Procedure: OPEN INCARCERATED HERNIA REPAIR VENTRAL ADULT;  Surgeon: Axel Filler, MD;  Location: [facility] OR;  Service: General;  Laterality: N/A;    OB History    No data available       Home Medications    Prior to Admission medications   Medication Sig Start Date End Date Taking? Authorizing Provider  cloNIDine (CATAPRES) 0.1 MG tablet Take 1 tablet (0.1 mg total) by mouth 2 (two)  times daily. 02/23/17   Little, Ambrose Finland, MD  escitalopram (LEXAPRO) 10 MG tablet Take 1 tablet (10 mg total) by mouth daily. After 1 week may increase to 2 tablets (20 mg total) by mouth daily Patient not taking: Reported on 10/03/2016 09/23/16   Lizbeth Bark, FNP  famotidine (PEPCID) 20 MG tablet Take 1 tablet (20 mg total) by mouth 2 (two) times daily. Patient not taking: Reported on 09/23/2016 02/24/16   Jaynie Crumble, PA-C  HYDROcodone-acetaminophen (NORCO) 5-325 MG tablet Take 1-2 tablets by mouth every 6 (six) hours as needed for moderate pain. 10/04/16   Harriette Bouillon, MD  hydrOXYzine (ATARAX/VISTARIL) 25 MG tablet Take 1 tablet (25 mg total) by mouth every 6 (six) hours as needed for anxiety. Patient not taking: Reported on 09/23/2016 05/08/16   Pisciotta, Joni Reining, PA-C  hydrOXYzine (VISTARIL) 25 MG capsule Take 1 capsule (25 mg total) by mouth 3 (three) times daily as needed. Patient not taking: Reported on 02/24/2016 06/17/15   Ambrose Finland, NP  omeprazole (PRILOSEC) 20 MG capsule Take 1 capsule (20 mg total) by mouth daily. Patient not taking: Reported on 09/23/2016 02/24/16   Jaynie Crumble, PA-C  promethazine (PHENERGAN) 25 MG tablet Take 1 tablet (25 mg total) by mouth every 6 (six) hours as needed for nausea or vomiting. Patient not taking: Reported on 09/23/2016 05/08/16  Pisciotta, Joni Reining, PA-C  propranolol (INDERAL) 40 MG tablet Take 1 tablet (40 mg total) by mouth 2 (two) times daily. 02/23/17   Little, Ambrose Finland, MD    Family History Family History  Problem Relation Age of Onset  . CAD Other   . Diabetes Other   . Hypertension Other   . Cancer Other   . Thyroid disease Other     Social History Social History  Substance Use Topics  . Smoking status: Current Every Day Smoker    Packs/day: 0.50    Types: Cigarettes  . Smokeless tobacco: Never Used  . Alcohol use Yes     Comment: 4 0r 5 fourty oz per day     Allergies   Ibuprofen;  Peanut-containing drug products; Acetaminophen; Codeine; Lisinopril; Penicillins; and Ativan [lorazepam]   Review of Systems Review of Systems   All other systems reviewed and are negative for acute change except as noted in the HPI.  Physical Exam Updated Vital Signs BP (!) 165/128   Pulse 87   Temp 98.3 F (36.8 C) (Oral)   Resp 18   Ht 5' (1.524 m)   Wt 130 lb (59 kg)   LMP 02/21/2017   SpO2 94%   BMI 25.39 kg/m   Physical Exam  Constitutional: She is oriented to person, place, and time. She appears well-developed and well-nourished. No distress.  Appears uncomfortable. Pt is tearful.  HENT:  Head: Normocephalic and atraumatic.  Eyes: EOM are normal.  Neck: Normal range of motion.  Cardiovascular: Normal rate, regular rhythm and normal heart sounds.   Pulmonary/Chest: Effort normal and breath sounds normal.  Abdominal: Soft. She exhibits no distension. There is tenderness.  Musculoskeletal: Normal range of motion.  Neurological: She is alert and oriented to person, place, and time.  Skin: Skin is warm and dry.  Psychiatric: She has a normal mood and affect. Judgment normal.  Nursing note and vitals reviewed.    ED Treatments / Results  DIAGNOSTIC STUDIES: Oxygen Saturation is 95% on RA, adequate by my interpretation.   COORDINATION OF CARE: 6:36 PM-Discussed next steps with pt including a renal stone study. Pt verbalized understanding and is agreeable with the plan.   Labs (all labs ordered are listed, but only abnormal results are displayed) Labs Reviewed  URINALYSIS, ROUTINE W REFLEX MICROSCOPIC - Abnormal; Notable for the following:       Result Value   APPearance CLOUDY (*)    Leukocytes, UA MODERATE (*)    All other components within normal limits  COMPREHENSIVE METABOLIC PANEL - Abnormal; Notable for the following:    Potassium 3.3 (*)    Chloride 97 (*)    Glucose, Bld 133 (*)    AST 128 (*)    ALT 151 (*)    All other components within normal  limits  CBC WITH DIFFERENTIAL/PLATELET - Abnormal; Notable for the following:    WBC 12.6 (*)    Hemoglobin 15.1 (*)    Neutro Abs 10.3 (*)    All other components within normal limits  ETHANOL - Abnormal; Notable for the following:    Alcohol, Ethyl (B) 16 (*)    All other components within normal limits  URINALYSIS, MICROSCOPIC (REFLEX) - Abnormal; Notable for the following:    Bacteria, UA MANY (*)    Squamous Epithelial / LPF 6-30 (*)    All other components within normal limits  LIPASE, BLOOD  PREGNANCY, URINE    EKG  EKG Interpretation None  Radiology Ct Renal Stone Study  Result Date: 03/05/2017 CLINICAL DATA:  Right groin and bladder pain with dysuria. Clinical concern for nephrolithiasis. History of cirrhosis. EXAM: CT ABDOMEN AND PELVIS WITHOUT CONTRAST TECHNIQUE: Multidetector CT imaging of the abdomen and pelvis was performed following the standard protocol without IV contrast. COMPARISON:  10/09/2016 abdominal sonogram. FINDINGS: Lower chest: Right middle lobe 3 mm solid pulmonary nodule, for which no further follow-up is required unless the patient has significant risk factors for lung malignancy. Hepatobiliary: Mild hepatomegaly. Prominent heterogeneous hepatic steatosis. No discrete liver mass. No definite liver surface irregularity. Normal gallbladder with no radiopaque cholelithiasis. No biliary ductal dilatation. Pancreas: Normal, with no mass or duct dilation. Spleen: Normal size. No mass. Adrenals/Urinary Tract: Normal adrenals. No renal stones. No hydronephrosis. No contour deforming renal mass. Normal caliber ureters, with no ureteral stones. Under distended and grossly normal bladder. Stomach/Bowel: Grossly normal stomach. Normal caliber small bowel with no small bowel wall thickening. Normal appendix. Normal large bowel with no diverticulosis, large bowel wall thickening or pericolonic fat stranding. Vascular/Lymphatic: Normal caliber abdominal aorta. No  pathologically enlarged lymph nodes in the abdomen or pelvis. Reproductive: Grossly normal anteverted uterus. Left adnexal 1.2 cm simple cyst, for which no further evaluation is required ( This recommendation follows ACR consensus guidelines: White Paper of the ACR Incidental Findings Committee II on Adnexal Findings. J Am Coll Radiol [phone]. ). No additional adnexal lesions. Other: No pneumoperitoneum, ascites or focal fluid collection. Postsurgical changes from right periumbilical hernia repair. No evidence of a recurrent hernia. Musculoskeletal: No aggressive appearing focal osseous lesions. IMPRESSION: 1. No urolithiasis.  No evidence of urinary tract obstruction. 2. No evidence of bowel obstruction or acute bowel inflammation. 3. Mild hepatomegaly. Prominent heterogeneous hepatic steatosis. No overt morphologic changes of cirrhosis. No discrete liver mass. Electronically Signed   By: Delbert Phenix M.D.   On: 03/05/2017 19:53    Procedures Procedures (including critical care time)  Medications Ordered in ED Medications  cefTRIAXone (ROCEPHIN) 1 g in dextrose 5 % 50 mL IVPB (1 g Intravenous New Bag/Given 03/05/17 2010)  ondansetron (ZOFRAN) injection 4 mg (4 mg Intravenous Given 03/05/17 1827)  HYDROmorphone (DILAUDID) injection 0.5 mg (0.5 mg Intravenous Given 03/05/17 1838)     Initial Impression / Assessment and Plan / ED Course  I have reviewed the triage vital signs and the nursing notes.  Pertinent labs & imaging results that were available during my care of the patient were reviewed by me and considered in my medical decision making (see chart for details).      Discussed reduction of EtOH use with pt after reviewing pt's lab work and liver function with her.     Pt diagnosed with a UTI. Pt is afebrile, and without tachycardia, hypotension, or other signs of serious infection.  Pt to be dc home with antibiotics and instructions to follow up with PCP if symptoms persist. Pt's  renal stone study showed an ovarian cyst, which has likely caused the majority of her pain. per radiology, follow up is not needed for this issue. Discussed return precautions. Pt appears safe for discharge.   Final Clinical Impressions(s) / ED Diagnoses   Final diagnoses:  Right groin pain    New Prescriptions New Prescriptions   No medications on file    I personally performed the services described in this documentation, which was scribed in my presence. The recorded information has been reviewed and is accurate.       Arthor Captain, PA-C 03/10/17  [address], [place] 03/11/17 [phone]

## 2017-03-08 MED FILL — ?ONDANSETRON HCL 4 MG TABLE: 4 | 6 days supply | Qty: 20 | Fill #0

## 2017-03-08 MED FILL — PROPRANOLOL 40 MG TABLET: 40 | 30 days supply | Qty: 60 | Fill #1

## 2017-05-01 ENCOUNTER — Emergency Department (HOSPITAL_COMMUNITY): Payer: Self-pay

## 2017-05-01 ENCOUNTER — Emergency Department (HOSPITAL_COMMUNITY)
Admission: EM | Admit: 2017-05-01 | Discharge: 2017-05-01 | Disposition: A | Discharge status: Home / Self Care | Payer: Self-pay | Attending: Emergency Medicine | Admitting: Emergency Medicine

## 2017-05-01 ENCOUNTER — Encounter (HOSPITAL_COMMUNITY): Payer: Self-pay | Admitting: *Deleted

## 2017-05-01 DIAGNOSIS — S0990XA Unspecified injury of head, initial encounter: Secondary | ICD-10-CM | POA: Insufficient documentation

## 2017-05-01 DIAGNOSIS — Y999 Unspecified external cause status: Secondary | ICD-10-CM | POA: Insufficient documentation

## 2017-05-01 DIAGNOSIS — S161XXA Strain of muscle, fascia and tendon at neck level, initial encounter: Secondary | ICD-10-CM | POA: Insufficient documentation

## 2017-05-01 DIAGNOSIS — W228XXA Striking against or struck by other objects, initial encounter: Secondary | ICD-10-CM | POA: Insufficient documentation

## 2017-05-01 DIAGNOSIS — Y929 Unspecified place or not applicable: Secondary | ICD-10-CM | POA: Insufficient documentation

## 2017-05-01 DIAGNOSIS — I10 Essential (primary) hypertension: Secondary | ICD-10-CM | POA: Insufficient documentation

## 2017-05-01 DIAGNOSIS — F17210 Nicotine dependence, cigarettes, uncomplicated: Secondary | ICD-10-CM | POA: Insufficient documentation

## 2017-05-01 DIAGNOSIS — Z79899 Other long term (current) drug therapy: Secondary | ICD-10-CM | POA: Insufficient documentation

## 2017-05-01 DIAGNOSIS — Y9389 Activity, other specified: Secondary | ICD-10-CM | POA: Insufficient documentation

## 2017-05-01 DIAGNOSIS — Z7983 Long term (current) use of bisphosphonates: Secondary | ICD-10-CM | POA: Insufficient documentation

## 2017-05-01 MED ORDER — CYCLOBENZAPRINE HCL 10 MG PO TABS
10.0000 mg | ORAL_TABLET | Freq: Two times a day (BID) | ORAL | 0 refills | Status: DC | PRN
Start: 2017-05-01 — End: 2017-07-31

## 2017-05-01 MED ORDER — CLONIDINE HCL 0.1 MG PO TABS
0.1000 mg | ORAL_TABLET | Freq: Once | ORAL | Status: AC
  Administered 2017-05-01: 0.1 mg via ORAL
  Filled 2017-05-01: qty 1

## 2017-05-01 MED ORDER — ALPRAZOLAM 0.25 MG PO TABS
0.5000 mg | ORAL_TABLET | Freq: Once | ORAL | Status: AC
  Administered 2017-05-01: 0.5 mg via ORAL
  Filled 2017-05-01: qty 2

## 2017-05-01 MED ORDER — PROPRANOLOL HCL 40 MG PO TABS: 40.0000 mg | ORAL_TABLET | Freq: Two times a day (BID) | ORAL | 0 refills | Status: DC

## 2017-05-01 MED ORDER — PROPRANOLOL HCL 40 MG PO TABS
40.0000 mg | ORAL_TABLET | Freq: Once | ORAL | Status: AC
  Administered 2017-05-01: 40 mg via ORAL
  Filled 2017-05-01 (×2): qty 1

## 2017-05-01 MED ORDER — CLONIDINE HCL 0.1 MG PO TABS: 0.1000 mg | ORAL_TABLET | Freq: Two times a day (BID) | ORAL | 0 refills | Status: DC

## 2017-05-01 NOTE — ED Provider Notes (Signed)
[facility]-EMERGENCY DEPT Provider Note   CSN: [id number] Arrival date & time: 05/01/17  0009     History   Chief Complaint Chief Complaint  Patient presents with  . Neck Pain    HPI [name] is a 31 y.o. female.  Patient presents via EMS after diving into the shallow end of a pool and hitting her head on the bottom. She reports headache and right sided neck pain. She states there was a subsequent brief LOC while walking to her car. No nausea or vomiting. The patient states she had one alcoholic beverage after the injury 'for pain'. No other injury.    The history is provided by the patient and a significant other. No language interpreter was used.  Neck Pain   Associated symptoms include headaches. Pertinent negatives include no chest pain and no weakness.    Past Medical History:  Diagnosis Date  . Anxiety   . Cirrhosis of liver (HCC)   . Hypertension   . Polysubstance abuse   . Renal disorder    Kidney Infection     Patient Active Problem List   Diagnosis Date Noted  . Ventral hernia 10/04/2016  . Incarcerated ventral hernia 10/03/2016  . Anxiety 02/15/2014  . Hypertension 02/15/2014    Past Surgical History:  Procedure Laterality Date  . ARTERY REPAIR    . CESAREAN SECTION     x 5  . MOUTH SURGERY    . TEAR DUCT PROBING    . TUBAL LIGATION    . VENTRAL HERNIA REPAIR N/A 10/03/2016   Procedure: OPEN INCARCERATED HERNIA REPAIR VENTRAL ADULT;  Surgeon: Axel Filler, MD;  Location: [facility] OR;  Service: General;  Laterality: N/A;    OB History    No data available       Home Medications    Prior to Admission medications   Medication Sig Start Date End Date Taking? Authorizing Provider  cephALEXin (KEFLEX) 500 MG capsule 2 caps po bid x 7 days 03/05/17   Arthor Captain, PA-C  cloNIDine (CATAPRES) 0.1 MG tablet Take 1 tablet (0.1 mg total) by mouth 2 (two) times daily. 03/05/17   Harris, Abigail, PA-C  escitalopram (LEXAPRO) 10 MG tablet Take 1 tablet  (10 mg total) by mouth daily. After 1 week may increase to 2 tablets (20 mg total) by mouth daily Patient not taking: Reported on 10/03/2016 09/23/16   Lizbeth Bark, FNP  famotidine (PEPCID) 20 MG tablet Take 1 tablet (20 mg total) by mouth 2 (two) times daily. Patient not taking: Reported on 09/23/2016 02/24/16   Jaynie Crumble, PA-C  HYDROcodone-acetaminophen (NORCO) 5-325 MG tablet Take 1-2 tablets by mouth every 6 (six) hours as needed for moderate pain. 10/04/16   Harriette Bouillon, MD  hydrOXYzine (ATARAX/VISTARIL) 25 MG tablet Take 1 tablet (25 mg total) by mouth every 6 (six) hours as needed for anxiety. Patient not taking: Reported on 09/23/2016 05/08/16   Pisciotta, Joni Reining, PA-C  hydrOXYzine (VISTARIL) 25 MG capsule Take 1 capsule (25 mg total) by mouth 3 (three) times daily as needed. Patient not taking: Reported on 02/24/2016 06/17/15   Ambrose Finland, NP  omeprazole (PRILOSEC) 20 MG capsule Take 1 capsule (20 mg total) by mouth daily. Patient not taking: Reported on 09/23/2016 02/24/16   Jaynie Crumble, PA-C  ondansetron (ZOFRAN) 4 MG tablet Take 1 tablet (4 mg total) by mouth every 8 (eight) hours as needed for nausea or vomiting. 03/05/17   Arthor Captain, PA-C  oxyCODONE (OXY IR/ROXICODONE) 5 MG  immediate release tablet Take 0.5-1 tablets (2.5-5 mg total) by mouth every 4 (four) hours as needed for severe pain. 03/05/17   Arthor Captain, PA-C  promethazine (PHENERGAN) 25 MG tablet Take 1 tablet (25 mg total) by mouth every 6 (six) hours as needed for nausea or vomiting. Patient not taking: Reported on 09/23/2016 05/08/16   Pisciotta, Joni Reining, PA-C  propranolol (INDERAL) 40 MG tablet Take 1 tablet (40 mg total) by mouth 2 (two) times daily. 03/05/17   Arthor Captain, PA-C    Family History Family History  Problem Relation Age of Onset  . CAD Other   . Diabetes Other   . Hypertension Other   . Cancer Other   . Thyroid disease Other     Social History Social History    Substance Use Topics  . Smoking status: Current Every Day Smoker    Packs/day: 0.50    Types: Cigarettes  . Smokeless tobacco: Never Used  . Alcohol use Yes     Comment: 4 0r 5 fourty oz per day     Allergies   Ibuprofen; Peanut-containing drug products; Acetaminophen; Codeine; Lisinopril; Penicillins; and Ativan [lorazepam]   Review of Systems Review of Systems  Constitutional: Negative for diaphoresis.  HENT: Negative.  Negative for dental problem, facial swelling and trouble swallowing.   Eyes: Negative.  Negative for pain and visual disturbance.  Respiratory: Negative.  Negative for cough and shortness of breath.   Cardiovascular: Negative.  Negative for chest pain.  Gastrointestinal: Negative.  Negative for abdominal pain and nausea.  Musculoskeletal: Positive for neck pain.  Skin: Negative for wound.  Neurological: Positive for syncope and headaches. Negative for facial asymmetry, speech difficulty and weakness.     Physical Exam Updated Vital Signs BP (!) 145/113   Pulse (!) 115   Temp 98.7 F (37.1 C) (Oral)   Resp (!) 22   LMP 04/27/2017   SpO2 93%   Physical Exam  Constitutional: She is oriented to person, place, and time. She appears well-developed and well-nourished. No distress.  Patient is anxious, mildly agitated but cooperative.   HENT:  Head: Normocephalic.  Nose: Nose normal.  No scalp or facial hematoma. There is a superficial abrasion of parietal scalp. No facial tenderness. No malocclusion.  Eyes: Pupils are equal, round, and reactive to light. Conjunctivae are normal.  Neck: Normal range of motion. Neck supple.  Cardiovascular: Regular rhythm.  Tachycardia present.   No murmur heard. Pulmonary/Chest: Effort normal and breath sounds normal. She has no wheezes. She has no rales. She exhibits no tenderness.  Abdominal: Soft. Bowel sounds are normal. There is no tenderness. There is no rebound and no guarding.  Musculoskeletal: Normal range of  motion. She exhibits no tenderness.  Neurological: She is alert and oriented to person, place, and time.  CN's 3-12 grossly intact. Speech is clear and focused. No facial asymmetry. No lateralizing weakness. No deficits of coordination.     Skin: Skin is warm and dry. No rash noted.  Psychiatric: She has a normal mood and affect.     ED Treatments / Results  Labs (all labs ordered are listed, but only abnormal results are displayed) Labs Reviewed - No data to display  EKG  EKG Interpretation None       Radiology Ct Head Wo Contrast  Result Date: 05/01/2017 CLINICAL DATA:  Patient struck head against concrete in swimming pool. Loss of consciousness. Dizziness and pain to the right side of the face and neck. EXAM: CT HEAD WITHOUT  CONTRAST CT CERVICAL SPINE WITHOUT CONTRAST TECHNIQUE: Multidetector CT imaging of the head and cervical spine was performed following the standard protocol without intravenous contrast. Multiplanar CT image reconstructions of the cervical spine were also generated. COMPARISON:  CT head 01/02/2014.  CT maxillofacial 05/08/2016. FINDINGS: CT HEAD FINDINGS Brain: No evidence of acute infarction, hemorrhage, hydrocephalus, extra-axial collection or mass lesion/mass effect. Vascular: No hyperdense vessel or unexpected calcification. Skull: Normal. Negative for fracture or focal lesion. Sinuses/Orbits: Mucosal thickening in the paranasal sinuses. No acute air-fluid levels. Mastoid air cells are not opacified. Other: Nasal bone deformities without soft tissue swelling likely representing old fracture. CT CERVICAL SPINE FINDINGS Alignment: Reversal of the usual cervical lordosis without anterior subluxation. This is likely due to patient positioning but ligamentous injury or muscle spasm could also have this appearance and are not excluded. Normal alignment of the facet joints. C1-2 articulation appears intact. Skull base and vertebrae: No acute fracture. No primary bone  lesion or focal pathologic process. Soft tissues and spinal canal: No prevertebral fluid or swelling. No visible canal hematoma. Disc levels:  Intervertebral disc space heights are preserved. Upper chest: Negative. Other: None. IMPRESSION: No acute intracranial abnormalities. Nonspecific reversal of the usual cervical lordosis. No acute displaced fractures identified in the cervical spine. Electronically Signed   By: Burman Nieves M.D.   On: 05/01/2017 01:26   Ct Cervical Spine Wo Contrast  Result Date: 05/01/2017 CLINICAL DATA:  Patient struck head against concrete in swimming pool. Loss of consciousness. Dizziness and pain to the right side of the face and neck. EXAM: CT HEAD WITHOUT CONTRAST CT CERVICAL SPINE WITHOUT CONTRAST TECHNIQUE: Multidetector CT imaging of the head and cervical spine was performed following the standard protocol without intravenous contrast. Multiplanar CT image reconstructions of the cervical spine were also generated. COMPARISON:  CT head 01/02/2014.  CT maxillofacial 05/08/2016. FINDINGS: CT HEAD FINDINGS Brain: No evidence of acute infarction, hemorrhage, hydrocephalus, extra-axial collection or mass lesion/mass effect. Vascular: No hyperdense vessel or unexpected calcification. Skull: Normal. Negative for fracture or focal lesion. Sinuses/Orbits: Mucosal thickening in the paranasal sinuses. No acute air-fluid levels. Mastoid air cells are not opacified. Other: Nasal bone deformities without soft tissue swelling likely representing old fracture. CT CERVICAL SPINE FINDINGS Alignment: Reversal of the usual cervical lordosis without anterior subluxation. This is likely due to patient positioning but ligamentous injury or muscle spasm could also have this appearance and are not excluded. Normal alignment of the facet joints. C1-2 articulation appears intact. Skull base and vertebrae: No acute fracture. No primary bone lesion or focal pathologic process. Soft tissues and spinal  canal: No prevertebral fluid or swelling. No visible canal hematoma. Disc levels:  Intervertebral disc space heights are preserved. Upper chest: Negative. Other: None. IMPRESSION: No acute intracranial abnormalities. Nonspecific reversal of the usual cervical lordosis. No acute displaced fractures identified in the cervical spine. Electronically Signed   By: Burman Nieves M.D.   On: 05/01/2017 01:26    Procedures Procedures (including critical care time)  Medications Ordered in ED Medications  cloNIDine (CATAPRES) tablet 0.1 mg (not administered)  propranolol (INDERAL) tablet 40 mg (not administered)  ALPRAZolam (XANAX) tablet 0.5 mg (0.5 mg Oral Given 05/01/17 0127)     Initial Impression / Assessment and Plan / ED Course  I have reviewed the triage vital signs and the nursing notes.  Pertinent labs & imaging results that were available during my care of the patient were reviewed by me and considered in my medical decision making (see  chart for details).     The patient presents after head injury while diving into shallow end of a pool. Subsequent LOC, brief. No nausea, or visual changes.   Neurologic exam is without deficit. She is oriented, suspect mild intoxication but alert, reasonable. Xanax given for anxiety.  ST head and neck and negative. Collar removed by me. No change to her neurologic exam over time. Friend at bedside. Patient is conversing rationally.   She requests her blood pressure medication and states she has been out for the past 2 days. She confirms she takes clonidine and propranolol. These were ordered for her and will provide Rx for 30 days.     Final Clinical Impressions(s) / ED Diagnoses   Final diagnoses:  None   1. Head injury 2. Neck pain 3. Hypertension  New Prescriptions New Prescriptions   No medications on file     Elpidio Anis, Cordelia Poche 05/01/17 Simeon Craft, MD 05/01/17 2127

## 2017-05-01 NOTE — ED Notes (Signed)
The pt keeps calling out about rt face pain and she wants bp med

## 2017-05-01 NOTE — ED Notes (Signed)
The pt returned  From c-t she is still wet from the swimming pool.  Wet clothes and wet sheets removed and warm blankets placed under her and on top of her  Med given  C/o the neck brace and neck pain

## 2017-05-01 NOTE — ED Notes (Signed)
No bp med for 3 days.

## 2017-05-01 NOTE — ED Notes (Signed)
[name]- [phone]

## 2017-05-01 NOTE — ED Triage Notes (Addendum)
Pt dove into a pool hitting her head on the bottom of the pool; pt has abrasion to top of head. Pt tender to thoracic and cervical spine. Reports LOC, has dizziness, able to move all extremities without difficulty. EMS placed towel collar because pt refused c-collar. C-collar placed on arrival. Pt very anxious

## 2017-05-01 NOTE — ED Notes (Signed)
Pt given sandwich to eat.  

## 2017-07-05 MED FILL — ?PROPRANOLOL 40 MG TABLET: 40 | 30 days supply | Qty: 60 | Fill #2

## 2017-07-05 MED FILL — ESCITALOPRAM 10 MG TABLET: 10 | 30 days supply | Qty: 60 | Fill #1

## 2017-07-05 MED FILL — ?CLONIDINE HCL 0.1 MG TABL: 0.1 | 30 days supply | Qty: 60 | Fill #3

## 2017-07-22 ENCOUNTER — Ambulatory Visit: Payer: Self-pay | Admitting: Family Medicine

## 2017-07-31 ENCOUNTER — Other Ambulatory Visit: Payer: Self-pay

## 2017-07-31 ENCOUNTER — Inpatient Hospital Stay (HOSPITAL_BASED_OUTPATIENT_CLINIC_OR_DEPARTMENT_OTHER)
Admission: EM | Admit: 2017-07-31 | Discharge: 2017-08-05 | DRG: 896 | Disposition: A | Discharge status: Home / Self Care | Payer: Self-pay | Attending: Internal Medicine | Admitting: Internal Medicine

## 2017-07-31 ENCOUNTER — Encounter (HOSPITAL_BASED_OUTPATIENT_CLINIC_OR_DEPARTMENT_OTHER): Payer: Self-pay | Admitting: Emergency Medicine

## 2017-07-31 ENCOUNTER — Emergency Department (HOSPITAL_BASED_OUTPATIENT_CLINIC_OR_DEPARTMENT_OTHER): Payer: Self-pay

## 2017-07-31 DIAGNOSIS — E876 Hypokalemia: Secondary | ICD-10-CM | POA: Diagnosis not present

## 2017-07-31 DIAGNOSIS — F10229 Alcohol dependence with intoxication, unspecified: Secondary | ICD-10-CM | POA: Diagnosis present

## 2017-07-31 DIAGNOSIS — E8809 Other disorders of plasma-protein metabolism, not elsewhere classified: Secondary | ICD-10-CM | POA: Diagnosis present

## 2017-07-31 DIAGNOSIS — Z888 Allergy status to other drugs, medicaments and biological substances status: Secondary | ICD-10-CM

## 2017-07-31 DIAGNOSIS — Y906 Blood alcohol level of 120-199 mg/100 ml: Secondary | ICD-10-CM | POA: Diagnosis present

## 2017-07-31 DIAGNOSIS — F10230 Alcohol dependence with withdrawal, uncomplicated: Secondary | ICD-10-CM

## 2017-07-31 DIAGNOSIS — F10939 Alcohol use, unspecified with withdrawal, unspecified: Secondary | ICD-10-CM | POA: Diagnosis present

## 2017-07-31 DIAGNOSIS — Z885 Allergy status to narcotic agent status: Secondary | ICD-10-CM

## 2017-07-31 DIAGNOSIS — J9601 Acute respiratory failure with hypoxia: Secondary | ICD-10-CM | POA: Diagnosis present

## 2017-07-31 DIAGNOSIS — K7040 Alcoholic hepatic failure without coma: Secondary | ICD-10-CM

## 2017-07-31 DIAGNOSIS — K5900 Constipation, unspecified: Secondary | ICD-10-CM | POA: Diagnosis present

## 2017-07-31 DIAGNOSIS — F1910 Other psychoactive substance abuse, uncomplicated: Secondary | ICD-10-CM | POA: Diagnosis present

## 2017-07-31 DIAGNOSIS — Z886 Allergy status to analgesic agent status: Secondary | ICD-10-CM

## 2017-07-31 DIAGNOSIS — R0902 Hypoxemia: Secondary | ICD-10-CM

## 2017-07-31 DIAGNOSIS — F419 Anxiety disorder, unspecified: Secondary | ICD-10-CM | POA: Diagnosis present

## 2017-07-31 DIAGNOSIS — F1410 Cocaine abuse, uncomplicated: Secondary | ICD-10-CM | POA: Diagnosis present

## 2017-07-31 DIAGNOSIS — K625 Hemorrhage of anus and rectum: Secondary | ICD-10-CM | POA: Diagnosis present

## 2017-07-31 DIAGNOSIS — J209 Acute bronchitis, unspecified: Secondary | ICD-10-CM | POA: Diagnosis present

## 2017-07-31 DIAGNOSIS — D638 Anemia in other chronic diseases classified elsewhere: Secondary | ICD-10-CM | POA: Diagnosis present

## 2017-07-31 DIAGNOSIS — K7010 Alcoholic hepatitis without ascites: Secondary | ICD-10-CM | POA: Diagnosis present

## 2017-07-31 DIAGNOSIS — D649 Anemia, unspecified: Secondary | ICD-10-CM | POA: Diagnosis present

## 2017-07-31 DIAGNOSIS — F410 Panic disorder [episodic paroxysmal anxiety] without agoraphobia: Secondary | ICD-10-CM | POA: Diagnosis present

## 2017-07-31 DIAGNOSIS — R945 Abnormal results of liver function studies: Secondary | ICD-10-CM

## 2017-07-31 DIAGNOSIS — F10930 Alcohol use, unspecified with withdrawal, uncomplicated: Secondary | ICD-10-CM

## 2017-07-31 DIAGNOSIS — F17210 Nicotine dependence, cigarettes, uncomplicated: Secondary | ICD-10-CM | POA: Diagnosis present

## 2017-07-31 DIAGNOSIS — Z23 Encounter for immunization: Secondary | ICD-10-CM

## 2017-07-31 DIAGNOSIS — K219 Gastro-esophageal reflux disease without esophagitis: Secondary | ICD-10-CM | POA: Diagnosis present

## 2017-07-31 DIAGNOSIS — F10239 Alcohol dependence with withdrawal, unspecified: Principal | ICD-10-CM | POA: Diagnosis present

## 2017-07-31 DIAGNOSIS — I10 Essential (primary) hypertension: Secondary | ICD-10-CM | POA: Diagnosis present

## 2017-07-31 DIAGNOSIS — K7460 Unspecified cirrhosis of liver: Secondary | ICD-10-CM | POA: Diagnosis present

## 2017-07-31 DIAGNOSIS — R7989 Other specified abnormal findings of blood chemistry: Secondary | ICD-10-CM

## 2017-07-31 DIAGNOSIS — E871 Hypo-osmolality and hyponatremia: Secondary | ICD-10-CM | POA: Diagnosis not present

## 2017-07-31 DIAGNOSIS — Z880 Allergy status to penicillin: Secondary | ICD-10-CM

## 2017-07-31 DIAGNOSIS — R0602 Shortness of breath: Secondary | ICD-10-CM

## 2017-07-31 DIAGNOSIS — K649 Unspecified hemorrhoids: Secondary | ICD-10-CM | POA: Diagnosis present

## 2017-07-31 DIAGNOSIS — Z91010 Allergy to peanuts: Secondary | ICD-10-CM

## 2017-07-31 DIAGNOSIS — N200 Calculus of kidney: Secondary | ICD-10-CM | POA: Diagnosis present

## 2017-07-31 LAB — COMPREHENSIVE METABOLIC PANEL
ALT: 144 U/L — ABNORMAL HIGH (ref 14–54)
ANION GAP: 12 (ref 5–15)
AST: 429 U/L — ABNORMAL HIGH (ref 15–41)
Albumin: 2.7 g/dL — ABNORMAL LOW (ref 3.5–5.0)
Alkaline Phosphatase: 231 U/L — ABNORMAL HIGH (ref 38–126)
BUN: 5 mg/dL — ABNORMAL LOW (ref 6–20)
CHLORIDE: 105 mmol/L (ref 101–111)
CO2: 15 mmol/L — AB (ref 22–32)
Calcium: 7.6 mg/dL — ABNORMAL LOW (ref 8.9–10.3)
Creatinine, Ser: 0.64 mg/dL (ref 0.44–1.00)
GFR calc non Af Amer: >60 mL/min (ref >60)
Glucose, Bld: 111 mg/dL — ABNORMAL HIGH (ref 65–99)
Potassium: 4.1 mmol/L (ref 3.5–5.1)
SODIUM: 132 mmol/L — AB (ref 135–145)
Total Bilirubin: 5.2 mg/dL — ABNORMAL HIGH (ref 0.3–1.2)
Total Protein: 4.4 g/dL — ABNORMAL LOW (ref 6.5–8.1)

## 2017-07-31 LAB — URINALYSIS, ROUTINE W REFLEX MICROSCOPIC
Glucose, UA: NEGATIVE mg/dL
HGB URINE DIPSTICK: NEGATIVE
KETONES UR: NEGATIVE mg/dL
Leukocytes, UA: NEGATIVE
Nitrite: POSITIVE — AB
PROTEIN: NEGATIVE mg/dL
Specific Gravity, Urine: 1.02 (ref 1.005–1.030)
pH: 5.5 (ref 5.0–8.0)

## 2017-07-31 LAB — CBC
HCT: 27.1 % — ABNORMAL LOW (ref 36.0–46.0)
Hemoglobin: 10.2 g/dL — ABNORMAL LOW (ref 12.0–15.0)
MCH: 34.2 pg — ABNORMAL HIGH (ref 26.0–34.0)
MCHC: 37.6 g/dL — ABNORMAL HIGH (ref 30.0–36.0)
MCV: 90.9 fL (ref 78.0–100.0)
PLATELETS: 196 10*3/uL (ref 150–400)
RBC: 2.98 MIL/uL — ABNORMAL LOW (ref 3.87–5.11)
RDW: 16.8 % — ABNORMAL HIGH (ref 11.5–15.5)
WBC: 7.4 10*3/uL (ref 4.0–10.5)

## 2017-07-31 LAB — PROTIME-INR
INR: 1.07
PROTHROMBIN TIME: 13.8 s (ref 11.4–15.2)

## 2017-07-31 LAB — HCG, SERUM, QUALITATIVE: PREG SERUM: NEGATIVE

## 2017-07-31 LAB — URINALYSIS, MICROSCOPIC (REFLEX): RBC / HPF: NONE SEEN RBC/hpf (ref 0–5)

## 2017-07-31 LAB — APTT: APTT: 34 s (ref 24–36)

## 2017-07-31 LAB — OCCULT BLOOD X 1 CARD TO LAB, STOOL: FECAL OCCULT BLD: POSITIVE — AB

## 2017-07-31 LAB — LIPASE, BLOOD: LIPASE: 24 U/L (ref 11–51)

## 2017-07-31 LAB — ETHANOL: ALCOHOL ETHYL (B): 146 mg/dL — AB (ref <10)

## 2017-07-31 MED ORDER — METHYLPREDNISOLONE SODIUM SUCC 125 MG IJ SOLR
125.0000 mg | Freq: Once | INTRAMUSCULAR | Status: AC
  Administered 2017-07-31: 125 mg via INTRAVENOUS
  Filled 2017-07-31: qty 2

## 2017-07-31 MED ORDER — NITROFURANTOIN MONOHYD MACRO 100 MG PO CAPS: 100.0000 mg | ORAL_CAPSULE | Freq: Once | ORAL | Status: DC

## 2017-07-31 MED ORDER — DIAZEPAM 5 MG/ML IJ SOLN
5.0000 mg | Freq: Once | INTRAMUSCULAR | Status: AC
  Administered 2017-07-31: 5 mg via INTRAVENOUS
  Filled 2017-07-31: qty 2

## 2017-07-31 MED ORDER — ALPRAZOLAM 0.5 MG PO TABS
0.5000 mg | ORAL_TABLET | Freq: Once | ORAL | Status: AC
  Administered 2017-07-31: 0.5 mg via ORAL
  Filled 2017-07-31: qty 1

## 2017-07-31 MED ORDER — ALBUTEROL SULFATE (2.5 MG/3ML) 0.083% IN NEBU
2.5000 mg | INHALATION_SOLUTION | Freq: Once | RESPIRATORY_TRACT | Status: AC
  Administered 2017-07-31: 2.5 mg via RESPIRATORY_TRACT
  Filled 2017-07-31: qty 3

## 2017-07-31 MED ORDER — IBUPROFEN 800 MG PO TABS
ORAL_TABLET | ORAL | Status: AC
  Filled 2017-07-31: qty 1

## 2017-07-31 MED ORDER — SULFAMETHOXAZOLE-TRIMETHOPRIM 800-160 MG PO TABS: 1.0000 | ORAL_TABLET | Freq: Once | ORAL | Status: DC

## 2017-07-31 MED ORDER — NICOTINE 21 MG/24HR TD PT24
21.0000 mg | MEDICATED_PATCH | Freq: Once | TRANSDERMAL | Status: DC
  Administered 2017-07-31: 21 mg via TRANSDERMAL
  Filled 2017-07-31: qty 1

## 2017-07-31 MED ORDER — SODIUM CHLORIDE 0.9 % IV BOLUS (SEPSIS)
1000.0000 mL | Freq: Once | INTRAVENOUS | Status: AC
  Administered 2017-07-31: 1000 mL via INTRAVENOUS

## 2017-07-31 MED ORDER — METOCLOPRAMIDE HCL 5 MG/ML IJ SOLN
10.0000 mg | Freq: Once | INTRAMUSCULAR | Status: AC
  Administered 2017-07-31: 10 mg via INTRAVENOUS
  Filled 2017-07-31: qty 2

## 2017-07-31 MED ORDER — IOPAMIDOL (ISOVUE-370) INJECTION 76%
100.0000 mL | Freq: Once | INTRAVENOUS | Status: AC | PRN
  Administered 2017-07-31: 100 mL via INTRAVENOUS

## 2017-07-31 MED ORDER — IPRATROPIUM-ALBUTEROL 0.5-2.5 (3) MG/3ML IN SOLN
3.0000 mL | Freq: Four times a day (QID) | RESPIRATORY_TRACT | Status: DC
  Administered 2017-07-31 – 2017-08-01 (×2): 3 mL via RESPIRATORY_TRACT
  Filled 2017-07-31 (×2): qty 3

## 2017-07-31 NOTE — ED Notes (Signed)
ED Provider at bedside. 

## 2017-07-31 NOTE — H&P (Addendum)
History and Physical    [name] XWR:[id number] DOB: 12-Jul-1986 DOA: 07/31/2017  Referring MD/NP/PA:  Dr. Sharyon Medicus PCP: Lizbeth Bark, FNP  Patient coming from: [facility] transfer  Chief Complaint: Constipation  I have personally briefly reviewed patient's old medical records in [facility]  HPI: [name] is a 31 y.o. female with medical history significant of alcohol abuse, cirrhosis, HTN, anxiety/panic attacks, polysubstance abuse; who presented with multiple complaints.  Notes that she has been constipated for the last 6 days, and therefore has been unable to pass a bowel movement.  Reports significant straining and when she wipes notes that blood is present only on the paper towel.  Tried over-the-counter laxatives without significant relief.  As s result of her not having a bowel movement she reports having nausea and nonbloody emesis. Furthermore, she complains of a productive cough with clear sputum, wheezing, and shortness of breath over the last week.  Coughing causes her to have associated chest discomfort.  Patient admits to smoking cigarettes on a daily basis.  She used to drink approximately 15 40-ounce cans of beer per day and has cut back to 2. Associated symptoms include right-sided abdominal pain that is chronic, increased anxiety, and intially reported difficulty urinating.  denies any recent travel, prolonged immobilization, fever, chills, or dysuria symptoms at this time.  Admits that her last drink was yesterday morning due to her nerves being so bad.  ED Course: Upon admission into the emergency department patient was noted to be febrile, temperature 101-131, respirations 12-26, blood pressure 101/63 - 153/105, and O2 saturation noted to be as low as 86% on room air.  Patient was placed on 3 L of nasal cannula oxygen with improvement of O2 saturations greater than 92%. labs revealed WBC 7.4, hemoglobin 10.2, sodium 132, chloride 105, CO2 15, BUN 5,  creatinine 0.64, alcohol level 146, lipase 24, albumin 2.7, AST 429, ALT 144, alkaline phosphatase 231, and total bilirubin 5.2.  The patient urinalysis was thought to be contaminated.  Chest x-ray showed no acute abnormalities.  Patient was noted to be wheezing  and was given 125 mg of Solu-Medrol albuterol breathing treatment.  It appears patient was initially given Xanax as Ativan reportedly causes her to have hallucinations.  Thereafter patient was placed on IV Valium for her withdrawal symptoms.  CT angiogram of the chest was of poor quality, but did not show any clear signs of a pulmonary embolus.  Stool guaiacs were also noted to be positive,  but there were no gross signs of blood noted on rectal exam.  Patient was accepted to a stepdown bed.  Upon arrival to [facility] patient makes it known that she has not vomited since being in the hospital, and is ready to eat. She notes that she still able to pass flatus and had small bowel movement   Review of Systems  Constitutional: Positive for malaise/fatigue. Negative for chills and fever.  HENT: Negative for ear discharge and nosebleeds.   Eyes: Negative for photophobia and pain.  Respiratory: Positive for cough, sputum production, shortness of breath and wheezing.   Cardiovascular: Positive for chest pain (From coughing).  Gastrointestinal: Positive for abdominal pain, constipation, nausea and vomiting.  Genitourinary: Negative for dysuria and frequency.  Musculoskeletal: Positive for falls.  Skin: Negative for itching and rash.  Psychiatric/Behavioral: The patient is nervous/anxious.     Past Medical History:  Diagnosis Date  . Anxiety   . Cirrhosis of liver (HCC)   . Hypertension   .  Polysubstance abuse (HCC)   . Renal disorder    Kidney Infection     Past Surgical History:  Procedure Laterality Date  . ARTERY REPAIR    . CESAREAN SECTION     x 5  . MOUTH SURGERY    . TEAR DUCT PROBING    . TUBAL LIGATION    . VENTRAL HERNIA  REPAIR N/A 10/03/2016   Procedure: OPEN INCARCERATED HERNIA REPAIR VENTRAL ADULT;  Surgeon: Axel FillerArmando Ramirez, MD;  Location: [facility] OR;  Service: General;  Laterality: N/A;     reports that she has been smoking cigarettes.  She has been smoking about 0.50 packs per day. she has never used smokeless tobacco. She reports that she drinks alcohol. She reports that she uses drugs. Drug: Cocaine. Frequency: 2.00 times per week.  Allergies  Allergen Reactions  . Ibuprofen Anaphylaxis    Patient thinks it may have caused her throat to swell  . Peanut-Containing Drug Products Anaphylaxis  . Acetaminophen     Causes  patient to have an upset stomach  . Codeine Hives and Itching  . Lisinopril Swelling  . Penicillins Hives and Swelling    Has patient had a PCN reaction causing immediate rash, facial/tongue/throat swelling, SOB or lightheadedness with hypotension: Yes Has patient had a PCN reaction causing severe rash involving mucus membranes or skin necrosis: unknown Has patient had a PCN reaction that required hospitalization No Has patient had a PCN reaction occurring within the last 10 years: No If all of the above answers are "NO", then may proceed with Cephalosporin use.   . Ativan [Lorazepam] Anxiety    Hallucinations per patient    Family History  Problem Relation Age of Onset  . CAD Other   . Diabetes Other   . Hypertension Other   . Cancer Other   . Thyroid disease Other     Prior to Admission medications   Medication Sig Start Date End Date Taking? Authorizing Provider  cloNIDine (CATAPRES) 0.1 MG tablet Take 1 tablet (0.1 mg total) by mouth 2 (two) times daily. 05/01/17   Elpidio AnisUpstill, Shari, PA-C  escitalopram (LEXAPRO) 10 MG tablet Take 1 tablet (10 mg total) by mouth daily. After 1 week may increase to 2 tablets (20 mg total) by mouth daily Patient not taking: Reported on 10/03/2016 09/23/16   Lizbeth BarkHairston, Mandesia R, FNP  HYDROcodone-acetaminophen (NORCO) 5-325 MG tablet Take 1-2 tablets by  mouth every 6 (six) hours as needed for moderate pain. 10/04/16   Harriette Bouillonornett, Thomas, MD  hydrOXYzine (ATARAX/VISTARIL) 25 MG tablet Take 1 tablet (25 mg total) by mouth every 6 (six) hours as needed for anxiety. Patient not taking: Reported on 09/23/2016 05/08/16   Pisciotta, Joni ReiningNicole, PA-C  hydrOXYzine (VISTARIL) 25 MG capsule Take 1 capsule (25 mg total) by mouth 3 (three) times daily as needed. Patient not taking: Reported on 02/24/2016 06/17/15   Ambrose FinlandKeck, Valerie A, NP  omeprazole (PRILOSEC) 20 MG capsule Take 1 capsule (20 mg total) by mouth daily. Patient not taking: Reported on 09/23/2016 02/24/16   Jaynie CrumbleKirichenko, Tatyana, PA-C  ondansetron (ZOFRAN) 4 MG tablet Take 1 tablet (4 mg total) by mouth every 8 (eight) hours as needed for nausea or vomiting. 03/05/17   Arthor CaptainHarris, Abigail, PA-C  oxyCODONE (OXY IR/ROXICODONE) 5 MG immediate release tablet Take 0.5-1 tablets (2.5-5 mg total) by mouth every 4 (four) hours as needed for severe pain. 03/05/17   Arthor CaptainHarris, Abigail, PA-C  promethazine (PHENERGAN) 25 MG tablet Take 1 tablet (25 mg total) by mouth every  6 (six) hours as needed for nausea or vomiting. Patient not taking: Reported on 09/23/2016 05/08/16   Pisciotta, Joni Reining, PA-C  propranolol (INDERAL) 40 MG tablet Take 1 tablet (40 mg total) by mouth 2 (two) times daily. 05/01/17   Elpidio Anis, PA-C    Physical Exam:  Constitutional: Young female who appears to be fidgety, but able to follow commands Vitals:   07/31/17 2100 07/31/17 2130 07/31/17 2200 07/31/17 2230  BP: 101/63 (!) 153/105 (!) 137/99 (!) 137/100  Pulse: (!) 110 (!) 115 (!) 105 (!) 101  Resp: 17 (!) 24 (!) 22 (!) 21  Temp:      TempSrc:      SpO2: 98% 98% 97% 100%  Weight:      Height:       Eyes: PERRL, lids and conjunctivae normal ENMT: Mucous membranes are m dry. Posterior pharynx clear of any exudate or lesions.  Neck: normal, supple, no masses, no thyromegaly Respiratory: mildly decreased aeration at this time but no appreciable wheezes  or rhonchi. Cardiovascular: Tachycardic, no murmurs / rubs / gallops. No extremity edema. 2+ pedal pulses. No carotid bruits.  Abdomen: Mildly protuberant abdomen.  Tenderness to palpation in the right upper quadrant, bowel sounds are present.  No peritoneal signs appreciated  musculoskeletal: no clubbing / cyanosis. No joint deformity upper and lower extremities. Good ROM, no contractures. Normal muscle tone.  Skin: no rashes, lesions, ulcers. No induration Neurologic: CN 2-12 grossly intact. Sensation intact, DTR normal. Strength 5/5 in all 4.  Psychiatric: Normal judgment and insight. Alert and oriented x 3.  Anxious mood.     Labs on Admission: I have personally reviewed following labs and imaging studies  CBC: Recent Labs  Lab 07/31/17 1226  WBC 7.4  HGB 10.2*  HCT 27.1*  MCV 90.9  PLT 196   Basic Metabolic Panel: Recent Labs  Lab 07/31/17 1325  NA 132*  K 4.1  CL 105  CO2 15*  GLUCOSE 111*  BUN 5*  CREATININE 0.64  CALCIUM 7.6*   GFR: Estimated Creatinine Clearance: 84.6 mL/min (by C-G formula based on SCr of 0.64 mg/dL). Liver Function Tests: Recent Labs  Lab 07/31/17 1325  AST 429*  ALT 144*  ALKPHOS 231*  BILITOT 5.2*  PROT 4.4*  ALBUMIN 2.7*   Recent Labs  Lab 07/31/17 1325  LIPASE 24   No results for input(s): AMMONIA in the last 168 hours. Coagulation Profile: Recent Labs  Lab 07/31/17 1811  INR 1.07   Cardiac Enzymes: No results for input(s): CKTOTAL, CKMB, CKMBINDEX, TROPONINI in the last 168 hours. BNP (last 3 results) No results for input(s): PROBNP in the last 8760 hours. HbA1C: No results for input(s): HGBA1C in the last 72 hours. CBG: No results for input(s): GLUCAP in the last 168 hours. Lipid Profile: No results for input(s): CHOL, HDL, LDLCALC, TRIG, CHOLHDL, LDLDIRECT in the last 72 hours. Thyroid Function Tests: No results for input(s): TSH, T4TOTAL, FREET4, T3FREE, THYROIDAB in the last 72 hours. Anemia Panel: No  results for input(s): VITAMINB12, FOLATE, FERRITIN, TIBC, IRON, RETICCTPCT in the last 72 hours. Urine analysis:    Component Value Date/Time   COLORURINE AMBER (A) 07/31/2017 1432   APPEARANCEUR CLOUDY (A) 07/31/2017 1432   LABSPEC 1.020 07/31/2017 1432   PHURINE 5.5 07/31/2017 1432   GLUCOSEU NEGATIVE 07/31/2017 1432   HGBUR NEGATIVE 07/31/2017 1432   BILIRUBINUR MODERATE (A) 07/31/2017 1432   KETONESUR NEGATIVE 07/31/2017 1432   PROTEINUR NEGATIVE 07/31/2017 1432   UROBILINOGEN 0.2 02/23/2013  1447   NITRITE POSITIVE (A) 07/31/2017 1432   LEUKOCYTESUR NEGATIVE 07/31/2017 1432   Sepsis Labs: No results found for this or any previous visit (from the past 240 hour(s)).   Radiological Exams on Admission: Dg Chest 2 View  Result Date: 07/31/2017 CLINICAL DATA:  Pt has been having SOB, cough, nausea, vomiting, trouble urinating X 6 days. EXAM: CHEST  2 VIEW COMPARISON:  05/28/2011 FINDINGS: The heart size and mediastinal contours are within normal limits. Both lungs are clear. No pleural effusion or pneumothorax. The visualized skeletal structures are unremarkable. IMPRESSION: Normal chest radiographs. Electronically Signed   By: Amie Portland M.D.   On: 07/31/2017 12:31   Ct Angio Chest Pe W And/or Wo Contrast  Result Date: 07/31/2017 CLINICAL DATA:  Shortness of breath. Hypoxia. Wheezing and bronchitis. EXAM: CT ANGIOGRAPHY CHEST WITH CONTRAST TECHNIQUE: Multidetector CT imaging of the chest was performed using the standard protocol during bolus administration of intravenous contrast. Multiplanar CT image reconstructions and MIPs were obtained to evaluate the vascular anatomy. CONTRAST:  ISOVUE-370 IOPAMIDOL (ISOVUE-370) INJECTION 76% COMPARISON:  Chest x-ray from earlier tonight FINDINGS: Cardiovascular: The heart size is normal. The thoracic aorta demonstrates no atherosclerosis, aneurysm, or dissection. Evaluation for pulmonary emboli is somewhat limited due to respiratory motion  and stairstep artifact. Evaluation in the right upper lobe is particularly limited due to streak artifact off of dense contrast in the SVC. Some regions of decreased attenuation in the right upper lobe pulmonary arteries is favored to be artifactual rather than emboli. No other evidence of emboli seen. Mediastinum/Nodes: No enlarged mediastinal, hilar, or axillary lymph nodes. Thyroid gland, trachea, and esophagus demonstrate no significant findings. Lungs/Pleura: Central airways are normal. No pneumothorax. No pulmonary nodules, masses, or focal infiltrates identified. Upper Abdomen: PICC steatosis is identified. No acute abnormalities are seen in the upper abdomen. Musculoskeletal: No chest wall abnormality. No acute or significant osseous findings. Review of the MIP images confirms the above findings. IMPRESSION: 1. Evaluation of pulmonary emboli is limited in the right upper lobe due to streak artifact off of dense contrast in the SVC. Elsewhere, there is limitation due to respiratory motion and stairstep artifact. Taking these artifacts into account, there is no convincing evidence of pulmonary emboli. 2. No acute abnormalities identified. Electronically Signed   By: Gerome Sam III M.D   On: 07/31/2017 20:03    EKG: Independently reviewed.  Sinus tachycardia at 119 bpm  Assessment/Plan Alcohol intoxication with acute withdrawals: Patient presents with complaints of significant anxiety.  Notes last drink yesterday morning.  Found to have alcohol level of 146 on admission.  Initially given Xanax, but reports asked to be given Valium for alcohol withdrawal symptoms. - Admit to stepdown bed - CWIA protocols with Valium IV - social work consult polysubstance abuse  Acute respiratory failure with hypoxia, bronchitis: Acute.  Patient with history of smoking.  O2 saturations noted to be as low as 86% on room air.  Chest x-ray was otherwise clear and although of suboptimal quality CT angiogram showed no  clear signs of a pulmonary embolus. Found to have wheezes for which she was given 125 mg of Solu-Medrol IV and albuterol breathing treatment treatments with improvement of respirations, but still requiring 3 L of nasal cannula oxygen to maintain O2 saturations.  Question  recent cocaine use. - Continuous pulse oximetry with nasal cannula oxygen - Wean to room air as tolerated - Duonebs Q6hr - May consider continuing Solu-Medrol, if wheezing persists    Right-sided abdominal  pain  Acute on Chronic.   - Oxycodone prn pain   Nausea and vomiting: Patient reports having no further complaints of nausea/vomiting and requests to eat immediately.  Discussed risks and benefits of eating with noted history of rectal bleeding.  - IV fluids of normal saline at 100 mL/h - Advance diet as tolerated per patient  Rectal bleeding: Acute.  Patient relates symptoms to straining, and only notes blood when she wipes.  Stool guaiacs were positive, but no gross signs of blood were noted. - Continue to monitor - Consider need of consult to gastroenterology in a.m. if needed  Anemia: Acute.  Hemoglobin 10.2 on admission at 12.  Appears previously patient hemoglobin noted to be within normal limits. - Type and screen - Check CBC now - Will transfuse blood products if needed  Constipation: Acute.  At this time cannot patient still having bowel movements and able to pass flatus. 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  Labs revealed alkaline phosphatase 231, AST 429, ALT 144, and total bilirubin 5.2.  Suspect alcoholic hepatitis.  Patient's discriminant factor is less than 32 considering PT of 13.8 and total bilirubin.  - Continue propranolol - Check Abdominal ultrasound - Check hepatitis panel                             - Repeat CMP in a.m   Polysubstance abuse including H/O tobacco, cocaine, and  alcohol - Continue nicotine patch - Check UDS question if recent cocaine use could be because of her presentation  Abnormal UA: Patient currently denying any urinary complaints. - Follow-up urine culture  Hypoalbuminemia: Albumin 2.7 on admission. - check prealbumin in a.m.  Anxiety: Patient notes taking Xanax for anxiety.  Patient currently on CWIA protocols with valium.   GERD - Protonix   DVT prophylaxis: SCD   Code Status: Full Family Communication: No family present at bedside Disposition Plan: TBD  Consults called: None  Admission status: Inpatient  Clydie Braun MD [facility] Pager [phone]   If 7PM-7AM, please contact night-coverage [email or web address] Password [email or web address]  07/31/2017, 11:52 PM

## 2017-07-31 NOTE — ED Notes (Signed)
Pt given snacks and water per PA approval.

## 2017-07-31 NOTE — ED Notes (Signed)
Pt' was trialed on R/A SpO2 dropped to 86%. Pt placed back on 2L via N/C. EDP notified.

## 2017-07-31 NOTE — ED Provider Notes (Signed)
5:22 PM Handoff from [place] PA-C at shift change.   Patient with history of alcoholic liver disease, continual daily drinking presented with shortness of breath.  Patient was found to be hypoxic into the mid 80% range in the emergency department.  She feels better on oxygen.  She has had some wheezing and shortness of breath consistent with bronchitis recently.  In addition, patient complains of constipation.  Rectal exam performed with occult bleeding.  Patient found to be mildly anemic.  Liver function is abnormal with a total bili greater than 5 and transaminitis.  I discussed with the patient her results.  We discussed that as she would be admitted to the hospital and be detoxed from alcohol in addition to her other treatments, this would be a good time to cease alcohol intake.  7:21 PM Spoke with Dr. Sharyon Medicus who accepts patient for admission.  He requests consideration of chest CT to rule out PE.  This was ordered here.  Patient to be transferred for further treatment.  CIWA ordered. Patient is intolerant of Ativan.  Will treat with IV Valium.  Also given nicotine patch.  BP 130/88   Pulse (!) 112   Temp 99.2 F (37.3 C) (Oral)   Resp 19   Ht 5' (1.524 m)   Wt 65.8 kg (145 lb)   LMP 07/13/2017   SpO2 95%   BMI 28.32 kg/m      [name], Cordelia Poche 07/31/17 Herbert Spires, MD 08/01/17 [phone]

## 2017-07-31 NOTE — ED Notes (Signed)
EDP at bedside for rectal exam 

## 2017-07-31 NOTE — Progress Notes (Signed)
31 year old female with past medical history significant for alcoholism presenting with acute hypoxemia, tachycardia and probably alcohol withdrawal. May need CT of the chest and GI consult

## 2017-07-31 NOTE — ED Notes (Signed)
Awaiting lab values from main lab at [facility]. Called lab and they will return call to advise ETA on results.

## 2017-07-31 NOTE — ED Provider Notes (Signed)
[facility] EMERGENCY DEPARTMENT Provider Note   CSN: [id number] Arrival date & time: 07/31/17  1134     History   Chief Complaint Chief Complaint  Patient presents with  . Cough  . Anxiety  . Constipation    HPI [name] is a 31 y.o. female.  HPI  Ms. Pulse is a 31yo female history of anxiety, panic attacks, polysubstance abuse, cirrhosis, hypertension who presents emergency department for multiple complaints.  Patient states that her rectum hurts, that she has been constipated and been unable to pass a bowel movement for the past 5 days.  She reports heavy straining when she tries to pass a bowel movemnand passes some blood when she wipes.  She tried taking over-the-counter women's laxative medication 3 days ago without relief.  He also reports that it is very painful to urinate and endorses urinary frequency.  Denies hematuria or flank pain.  She also reports chronic right upper quadrant pain for the past year now.  She has associated vomiting.  Denies vomiting in the ER today.  Patient states that she feels excessively anxious.  She drank some beer this morning to help ease her nerves.  States that she has a history of panic attacks, requiring Xanax.  Patient also states that she has a cough with wheezing and shortness of breath.  This is been ongoing for the past several days now.  Her cough is productive of a clear mucus.  She does have some associated chest pain with cough.  She denies chest pain at rest.  Denies history of DVT/PE, no recent travel or immobility, denies leg swelling, exogenous estrogen use.  She is a tobacco user.   Past Medical History:  Diagnosis Date  . Anxiety   . Cirrhosis of liver (HCC)   . Hypertension   . Polysubstance abuse (HCC)   . Renal disorder    Kidney Infection     Patient Active Problem List   Diagnosis Date Noted  . Ventral hernia 10/04/2016  . Incarcerated ventral hernia 10/03/2016  . Anxiety 02/15/2014  .  Hypertension 02/15/2014    Past Surgical History:  Procedure Laterality Date  . ARTERY REPAIR    . CESAREAN SECTION     x 5  . MOUTH SURGERY    . TEAR DUCT PROBING    . TUBAL LIGATION    . VENTRAL HERNIA REPAIR N/A 10/03/2016   Procedure: OPEN INCARCERATED HERNIA REPAIR VENTRAL ADULT;  Surgeon: Axel Filler, MD;  Location: [facility] OR;  Service: General;  Laterality: N/A;    OB History    No data available       Home Medications    Prior to Admission medications   Medication Sig Start Date End Date Taking? Authorizing Provider  cephALEXin (KEFLEX) 500 MG capsule 2 caps po bid x 7 days 03/05/17   Arthor Captain, PA-C  cloNIDine (CATAPRES) 0.1 MG tablet Take 1 tablet (0.1 mg total) by mouth 2 (two) times daily. 05/01/17   Elpidio Anis, PA-C  cyclobenzaprine (FLEXERIL) 10 MG tablet Take 1 tablet (10 mg total) by mouth 2 (two) times daily as needed for muscle spasms. 05/01/17   Elpidio Anis, PA-C  escitalopram (LEXAPRO) 10 MG tablet Take 1 tablet (10 mg total) by mouth daily. After 1 week may increase to 2 tablets (20 mg total) by mouth daily Patient not taking: Reported on 10/03/2016 09/23/16   Lizbeth Bark, FNP  famotidine (PEPCID) 20 MG tablet Take 1 tablet (20 mg total) by  mouth 2 (two) times daily. Patient not taking: Reported on 09/23/2016 02/24/16   Jaynie CrumbleKirichenko, Tatyana, PA-C  HYDROcodone-acetaminophen (NORCO) 5-325 MG tablet Take 1-2 tablets by mouth every 6 (six) hours as needed for moderate pain. 10/04/16   Harriette Bouillonornett, Thomas, MD  hydrOXYzine (ATARAX/VISTARIL) 25 MG tablet Take 1 tablet (25 mg total) by mouth every 6 (six) hours as needed for anxiety. Patient not taking: Reported on 09/23/2016 05/08/16   Pisciotta, Joni ReiningNicole, PA-C  hydrOXYzine (VISTARIL) 25 MG capsule Take 1 capsule (25 mg total) by mouth 3 (three) times daily as needed. Patient not taking: Reported on 02/24/2016 06/17/15   Ambrose FinlandKeck, Valerie A, NP  omeprazole (PRILOSEC) 20 MG capsule Take 1 capsule (20 mg total) by mouth  daily. Patient not taking: Reported on 09/23/2016 02/24/16   Jaynie CrumbleKirichenko, Tatyana, PA-C  ondansetron (ZOFRAN) 4 MG tablet Take 1 tablet (4 mg total) by mouth every 8 (eight) hours as needed for nausea or vomiting. 03/05/17   Arthor CaptainHarris, Abigail, PA-C  oxyCODONE (OXY IR/ROXICODONE) 5 MG immediate release tablet Take 0.5-1 tablets (2.5-5 mg total) by mouth every 4 (four) hours as needed for severe pain. 03/05/17   Arthor CaptainHarris, Abigail, PA-C  promethazine (PHENERGAN) 25 MG tablet Take 1 tablet (25 mg total) by mouth every 6 (six) hours as needed for nausea or vomiting. Patient not taking: Reported on 09/23/2016 05/08/16   Pisciotta, Joni ReiningNicole, PA-C  propranolol (INDERAL) 40 MG tablet Take 1 tablet (40 mg total) by mouth 2 (two) times daily. 05/01/17   Elpidio AnisUpstill, Shari, PA-C    Family History Family History  Problem Relation Age of Onset  . CAD Other   . Diabetes Other   . Hypertension Other   . Cancer Other   . Thyroid disease Other     Social History Social History   Tobacco Use  . Smoking status: Current Every Day Smoker    Packs/day: 0.50    Types: Cigarettes  . Smokeless tobacco: Never Used  Substance Use Topics  . Alcohol use: Yes    Comment: 4 0r 5 fourty oz per day  . Drug use: Yes    Frequency: 2.0 times per week    Types: Cocaine    Comment: 02/23/17 last used 2 weeks ago     Allergies   Ibuprofen; Peanut-containing drug products; Acetaminophen; Codeine; Lisinopril; Penicillins; and Ativan [lorazepam]   Review of Systems Review of Systems  Constitutional: Negative for chills, fatigue and fever.  HENT: Negative for congestion.   Eyes: Negative for visual disturbance.  Respiratory: Positive for chest tightness, shortness of breath and wheezing.   Cardiovascular: Positive for chest pain (with cough). Negative for leg swelling.  Gastrointestinal: Positive for abdominal pain (right upper quadrant), constipation, nausea and vomiting (chronic). Negative for diarrhea.  Genitourinary: Positive  for dysuria and frequency. Negative for difficulty urinating and flank pain.  Musculoskeletal: Negative for gait problem.  Skin: Negative for rash.  Neurological: Negative for dizziness, weakness, light-headedness, numbness and headaches.  Psychiatric/Behavioral: The patient is nervous/anxious.      Physical Exam Updated Vital Signs BP (!) 125/93   Pulse (!) 101   Temp 99.2 F (37.3 C) (Oral)   Resp 12   Ht 5' (1.524 m)   Wt 65.8 kg (145 lb)   LMP 07/13/2017   SpO2 97%   BMI 28.32 kg/m   Physical Exam  Constitutional: She is oriented to person, place, and time. She appears well-developed and well-nourished.  Patient appears extremely anxious, speaking quickly and loudly.  HENT:  Head: Normocephalic  and atraumatic.  Mouth/Throat: Oropharynx is clear and moist. No oropharyngeal exudate.  Airway patent.  Eyes: Conjunctivae are normal. Pupils are equal, round, and reactive to light. Right eye exhibits no discharge. Left eye exhibits no discharge.  Neck: Normal range of motion. Neck supple. No JVD present. No tracheal deviation present.  Cardiovascular: Intact distal pulses. Exam reveals no friction rub.  No murmur heard. Tachycardic, regular rhythm.  Pulmonary/Chest:  High-pitched inspiratory and expiratory wheezes heard in bilateral upper and lower lung fields.  Patient is tachypneic.  Pulse ox 86% on room air.  No rales, stridor or rhonchi.  No chest tenderness.  Abdominal: Soft. Bowel sounds are normal.  Patient is mildly tender to palpation in the right upper quadrant.  No guarding or rigidity.  No palpable masses.  No CVA tenderness.  Murphy sign negative.  Genitourinary:  Genitourinary Comments: Chaperone present for exam. No gross blood noted on rectal exam, normal tone, no tenderness, no mass or fissure, no hemorrhoids noted.  Musculoskeletal:  No leg swelling, erythema or tenderness.  Lymphadenopathy:    She has no cervical adenopathy.  Neurological: She is alert and  oriented to person, place, and time. Coordination normal.  Skin: Skin is warm and dry. Capillary refill takes less than 2 seconds.  Nursing note and vitals reviewed.    ED Treatments / Results  Labs (all labs ordered are listed, but only abnormal results are displayed) Labs Reviewed  CBC - Abnormal; Notable for the following components:      Result Value   RBC 2.98 (*)    Hemoglobin 10.2 (*)    HCT 27.1 (*)    MCH 34.2 (*)    MCHC 37.6 (*)    RDW 16.8 (*)    All other components within normal limits  HCG, SERUM, QUALITATIVE  URINALYSIS, ROUTINE W REFLEX MICROSCOPIC  PREGNANCY, URINE  ETHANOL  COMPREHENSIVE METABOLIC PANEL  LIPASE, BLOOD  TROPONIN I  POC OCCULT BLOOD, ED    EKG  EKG Interpretation None       Radiology Dg Chest 2 View  Result Date: 07/31/2017 CLINICAL DATA:  Pt has been having SOB, cough, nausea, vomiting, trouble urinating X 6 days. EXAM: CHEST  2 VIEW COMPARISON:  05/28/2011 FINDINGS: The heart size and mediastinal contours are within normal limits. Both lungs are clear. No pleural effusion or pneumothorax. The visualized skeletal structures are unremarkable. IMPRESSION: Normal chest radiographs. Electronically Signed   By: Amie Portland M.D.   On: 07/31/2017 12:31    Procedures Procedures (including critical care time)  Medications Ordered in ED Medications  ALPRAZolam (XANAX) tablet 0.5 mg (0.5 mg Oral Given 07/31/17 1332)  albuterol (PROVENTIL) (2.5 MG/3ML) 0.083% nebulizer solution 2.5 mg (2.5 mg Nebulization Given 07/31/17 1424)     Initial Impression / Assessment and Plan / ED Course  I have reviewed the triage vital signs and the nursing notes.  Pertinent labs & imaging results that were available during my care of the patient were reviewed by me and considered in my medical decision making (see chart for details).  Clinical Course as of Aug 01 1639  Sat Jul 31, 2017  1519 Lungs clear to auscultation following breathing treatment.   Patient denies SOB this time.  [ES]  1600 Patient continues to refuse enema. She is up in the hallways, asking for food.   [ES]    Clinical Course User Index [ES] Kellie Shropshire, PA-C    Patient with history of alcohol use, panic attacks and cirrhosis  presents with multiple complaints.  Notably, she states that he has had a cough for several days with wheezing and shortness of breath.  She has significant wheezing in bilateral lung fields on exam. Two breathing treatments given in the ED.  Lungs CTA following breathing treatment, but her pulse ox continues to be 86% on room air. She has been put on 2 L nasal cannula.  Suspect that she is having COPD exacerbation given history of tobacco use and wheezing on exam.  Given IV Solu-Medrol.  Patient is tachycardic and tachypneic, although suspect this is related to alcohol withdrawal. Have given her Xanax several times (patient states ativan makes her hallucinate.) Doubt PE given no history of DVT/PE, no exogenous estrogen use, no recent surgeries or immobilization, no leg swelling. Discussed this patient with Dr. Madilyn Hook who agrees.   Lab work reviewed. CBC without leukocytosis, does have a mild anemia with hemoglobin 10.2. UA with positive bilirubin. Urine microscopic with many bacteria, few WBC and many squamous cells. Likely contaminated.  Urine pregnancy negative.  Awaiting CMP and lipase.  Plan is to admit this patient for COPD exacerbation and new oxygen requirement.  Have given sign out to oncoming PA Rhea Bleacher who Wiggins call hospitalist to admit this patient following return of blood work.    Final Clinical Impressions(s) / ED Diagnoses   Final diagnoses:  None    ED Discharge Orders    None       Lawrence Marseilles 07/31/17 1717    Tilden Fossa, MD 08/01/17 [phone]

## 2017-07-31 NOTE — ED Notes (Signed)
patient in and out cathed for urine

## 2017-07-31 NOTE — ED Triage Notes (Signed)
Pt appears anxious. Reports multiple complaints including cough, constipation, and difficulty urinating. Pt states she drank some beer this morning to help her relax.

## 2017-07-31 NOTE — ED Notes (Signed)
Pt leaving with [facility] at this time.  

## 2017-07-31 NOTE — ED Notes (Signed)
Patient is very anxious. And asking for fluids - PA encouraged to go see the patient related to her anxious behavior

## 2017-07-31 NOTE — ED Notes (Signed)
Patient ambulatory to the restroom on own without difficulty. ASked for urine sample. Patient states that she can not pee or poop at this time

## 2017-08-01 ENCOUNTER — Inpatient Hospital Stay (HOSPITAL_COMMUNITY): Payer: Self-pay

## 2017-08-01 DIAGNOSIS — K625 Hemorrhage of anus and rectum: Secondary | ICD-10-CM | POA: Diagnosis present

## 2017-08-01 DIAGNOSIS — K5900 Constipation, unspecified: Secondary | ICD-10-CM | POA: Diagnosis present

## 2017-08-01 DIAGNOSIS — F1910 Other psychoactive substance abuse, uncomplicated: Secondary | ICD-10-CM | POA: Diagnosis present

## 2017-08-01 DIAGNOSIS — D649 Anemia, unspecified: Secondary | ICD-10-CM | POA: Diagnosis present

## 2017-08-01 DIAGNOSIS — J9601 Acute respiratory failure with hypoxia: Secondary | ICD-10-CM | POA: Diagnosis present

## 2017-08-01 DIAGNOSIS — F10230 Alcohol dependence with withdrawal, uncomplicated: Secondary | ICD-10-CM

## 2017-08-01 LAB — CBC WITH DIFFERENTIAL/PLATELET
BASOS ABS: 0 10*3/uL (ref 0.0–0.1)
Basophils Relative: 0 %
Eosinophils Absolute: 0 10*3/uL (ref 0.0–0.7)
Eosinophils Relative: 0 %
HEMATOCRIT: 33.4 % — AB (ref 36.0–46.0)
Hemoglobin: 11.8 g/dL — ABNORMAL LOW (ref 12.0–15.0)
LYMPHS PCT: 11 %
Lymphs Abs: 0.6 10*3/uL — ABNORMAL LOW (ref 0.7–4.0)
MCH: 34.2 pg — ABNORMAL HIGH (ref 26.0–34.0)
MCHC: 35.3 g/dL (ref 30.0–36.0)
MCV: 96.8 fL (ref 78.0–100.0)
MONO ABS: 0.1 10*3/uL (ref 0.1–1.0)
Monocytes Relative: 2 %
NEUTROS ABS: 4.5 10*3/uL (ref 1.7–7.7)
NEUTROS PCT: 87 %
Platelets: 177 10*3/uL (ref 150–400)
RBC: 3.45 MIL/uL — AB (ref 3.87–5.11)
RDW: 16.7 % — ABNORMAL HIGH (ref 11.5–15.5)
WBC: 5.2 10*3/uL (ref 4.0–10.5)

## 2017-08-01 LAB — GLUCOSE, CAPILLARY
GLUCOSE-CAPILLARY: 255 mg/dL — AB (ref 65–99)
GLUCOSE-CAPILLARY: 114 mg/dL — AB (ref 65–99)
Glucose-Capillary: 182 mg/dL — ABNORMAL HIGH (ref 65–99)
Glucose-Capillary: 116 mg/dL — ABNORMAL HIGH (ref 65–99)
Glucose-Capillary: 119 mg/dL — ABNORMAL HIGH (ref 65–99)

## 2017-08-01 LAB — COMPREHENSIVE METABOLIC PANEL
ALT: 141 U/L — AB (ref 14–54)
AST: 357 U/L — AB (ref 15–41)
Albumin: 2.9 g/dL — ABNORMAL LOW (ref 3.5–5.0)
Alkaline Phosphatase: 257 U/L — ABNORMAL HIGH (ref 38–126)
Anion gap: 13 (ref 5–15)
BILIRUBIN TOTAL: 4.6 mg/dL — AB (ref 0.3–1.2)
CO2: 16 mmol/L — ABNORMAL LOW (ref 22–32)
CREATININE: 0.57 mg/dL (ref 0.44–1.00)
Calcium: 8.6 mg/dL — ABNORMAL LOW (ref 8.9–10.3)
Chloride: 100 mmol/L — ABNORMAL LOW (ref 101–111)
GFR calc Af Amer: >60 mL/min (ref >60)
Glucose, Bld: 309 mg/dL — ABNORMAL HIGH (ref 65–99)
Potassium: 3.3 mmol/L — ABNORMAL LOW (ref 3.5–5.1)
Sodium: 129 mmol/L — ABNORMAL LOW (ref 135–145)
TOTAL PROTEIN: 5.5 g/dL — AB (ref 6.5–8.1)

## 2017-08-01 LAB — RAPID URINE DRUG SCREEN, HOSP PERFORMED
Amphetamines: NOT DETECTED
BARBITURATES: NOT DETECTED
BENZODIAZEPINES: POSITIVE — AB
COCAINE: POSITIVE — AB
OPIATES: NOT DETECTED
TETRAHYDROCANNABINOL: NOT DETECTED

## 2017-08-01 LAB — PREALBUMIN: PREALBUMIN: 13.1 mg/dL — AB (ref 18–38)

## 2017-08-01 LAB — HEMOGLOBIN A1C
Hgb A1c MFr Bld: 5.6 % (ref 4.8–5.6)
Mean Plasma Glucose: 114.02 mg/dL

## 2017-08-01 LAB — MRSA PCR SCREENING: MRSA BY PCR: NEGATIVE

## 2017-08-01 MED ORDER — PNEUMOCOCCAL VAC POLYVALENT 25 MCG/0.5ML IJ INJ
0.5000 mL | INJECTION | INTRAMUSCULAR | Status: AC
  Administered 2017-08-04: 0.5 mL via INTRAMUSCULAR
  Filled 2017-08-01 (×2): qty 0.5

## 2017-08-01 MED ORDER — ONDANSETRON HCL 4 MG PO TABS
4.0000 mg | ORAL_TABLET | Freq: Four times a day (QID) | ORAL | Status: DC | PRN
  Administered 2017-08-02: 4 mg via ORAL
  Filled 2017-08-01: qty 1

## 2017-08-01 MED ORDER — SENNA 8.6 MG PO TABS
1.0000 | ORAL_TABLET | Freq: Every day | ORAL | Status: DC
  Administered 2017-08-01 – 2017-08-05 (×5): 8.6 mg via ORAL
  Filled 2017-08-01 (×5): qty 1

## 2017-08-01 MED ORDER — ONDANSETRON HCL 4 MG/2ML IJ SOLN
4.0000 mg | Freq: Four times a day (QID) | INTRAMUSCULAR | Status: DC | PRN
  Administered 2017-08-01 – 2017-08-04 (×4): 4 mg via INTRAVENOUS
  Filled 2017-08-01 (×4): qty 2

## 2017-08-01 MED ORDER — INSULIN ASPART 100 UNIT/ML ~~LOC~~ SOLN
0.0000 [IU] | SUBCUTANEOUS | Status: DC
  Administered 2017-08-01: 5 [IU] via SUBCUTANEOUS
  Administered 2017-08-01: 2 [IU] via SUBCUTANEOUS
  Administered 2017-08-02 – 2017-08-05 (×9): 1 [IU] via SUBCUTANEOUS

## 2017-08-01 MED ORDER — SODIUM CHLORIDE 0.9 % IV SOLN
INTRAVENOUS | Status: DC
  Administered 2017-08-01: 1000 mL via INTRAVENOUS
  Administered 2017-08-01 – 2017-08-02 (×2): via INTRAVENOUS

## 2017-08-01 MED ORDER — INFLUENZA VAC SPLIT QUAD 0.5 ML IM SUSY
0.5000 mL | PREFILLED_SYRINGE | INTRAMUSCULAR | Status: AC
  Administered 2017-08-03: 0.5 mL via INTRAMUSCULAR
  Filled 2017-08-01: qty 0.5

## 2017-08-01 MED ORDER — PROPRANOLOL HCL 20 MG PO TABS
20.0000 mg | ORAL_TABLET | Freq: Two times a day (BID) | ORAL | Status: DC
  Administered 2017-08-01 – 2017-08-05 (×9): 20 mg via ORAL
  Filled 2017-08-01 (×10): qty 1

## 2017-08-01 MED ORDER — VITAMIN B-1 100 MG PO TABS
100.0000 mg | ORAL_TABLET | Freq: Every day | ORAL | Status: DC
  Administered 2017-08-02 – 2017-08-05 (×4): 100 mg via ORAL
  Filled 2017-08-01 (×4): qty 1

## 2017-08-01 MED ORDER — SODIUM CHLORIDE 0.9 % IV SOLN: Freq: Once | INTRAVENOUS | Status: DC

## 2017-08-01 MED ORDER — POTASSIUM CHLORIDE CRYS ER 20 MEQ PO TBCR
40.0000 meq | EXTENDED_RELEASE_TABLET | ORAL | Status: AC
  Administered 2017-08-01: 40 meq via ORAL
  Filled 2017-08-01: qty 2

## 2017-08-01 MED ORDER — IPRATROPIUM-ALBUTEROL 0.5-2.5 (3) MG/3ML IN SOLN: 3.0000 mL | RESPIRATORY_TRACT | Status: DC | PRN

## 2017-08-01 MED ORDER — THIAMINE HCL 100 MG/ML IJ SOLN
100.0000 mg | Freq: Every day | INTRAMUSCULAR | Status: DC
  Administered 2017-08-01: 100 mg via INTRAVENOUS
  Filled 2017-08-01 (×2): qty 2

## 2017-08-01 MED ORDER — OXYCODONE HCL 5 MG PO TABS
2.5000 mg | ORAL_TABLET | ORAL | Status: DC | PRN
  Administered 2017-08-01 – 2017-08-05 (×15): 5 mg via ORAL
  Filled 2017-08-01 (×15): qty 1

## 2017-08-01 MED ORDER — MAGNESIUM SULFATE 2 GM/50ML IV SOLN
2.0000 g | Freq: Once | INTRAVENOUS | Status: AC
  Administered 2017-08-01: 2 g via INTRAVENOUS
  Filled 2017-08-01: qty 50

## 2017-08-01 MED ORDER — CALCIUM GLUCONATE 10 % IV SOLN
1.0000 g | Freq: Once | INTRAVENOUS | Status: AC
  Administered 2017-08-01: 1 g via INTRAVENOUS
  Filled 2017-08-01: qty 10

## 2017-08-01 MED ORDER — LORAZEPAM 2 MG/ML IJ SOLN: 1.0000 mg | INTRAMUSCULAR | Status: DC | PRN

## 2017-08-01 MED ORDER — ADULT MULTIVITAMIN W/MINERALS CH
1.0000 | ORAL_TABLET | Freq: Every day | ORAL | Status: DC
  Administered 2017-08-01 – 2017-08-05 (×5): 1 via ORAL
  Filled 2017-08-01 (×5): qty 1

## 2017-08-01 MED ORDER — FOLIC ACID 1 MG PO TABS
1.0000 mg | ORAL_TABLET | Freq: Every day | ORAL | Status: DC
  Administered 2017-08-01 – 2017-08-05 (×5): 1 mg via ORAL
  Filled 2017-08-01 (×5): qty 1

## 2017-08-01 MED ORDER — PANTOPRAZOLE SODIUM 40 MG IV SOLR: 40.0000 mg | Freq: Two times a day (BID) | INTRAVENOUS | Status: AC

## 2017-08-01 MED ORDER — CLONIDINE HCL 0.1 MG PO TABS
0.1000 mg | ORAL_TABLET | Freq: Two times a day (BID) | ORAL | Status: DC
  Administered 2017-08-01 – 2017-08-05 (×9): 0.1 mg via ORAL
  Filled 2017-08-01 (×9): qty 1

## 2017-08-01 MED ORDER — PANTOPRAZOLE SODIUM 40 MG IV SOLR
40.0000 mg | Freq: Two times a day (BID) | INTRAVENOUS | Status: DC
  Administered 2017-08-01: 40 mg via INTRAVENOUS
  Filled 2017-08-01: qty 40

## 2017-08-01 MED ORDER — NICOTINE 21 MG/24HR TD PT24
21.0000 mg | MEDICATED_PATCH | Freq: Every day | TRANSDERMAL | Status: DC
  Administered 2017-08-01 – 2017-08-05 (×5): 21 mg via TRANSDERMAL
  Filled 2017-08-01 (×5): qty 1

## 2017-08-01 MED ORDER — DIAZEPAM 5 MG/ML IJ SOLN
2.5000 mg | Freq: Four times a day (QID) | INTRAMUSCULAR | Status: DC | PRN
  Administered 2017-08-01: 5 mg via INTRAVENOUS
  Administered 2017-08-01: 2.5 mg via INTRAVENOUS
  Administered 2017-08-01 – 2017-08-02 (×6): 5 mg via INTRAVENOUS
  Administered 2017-08-03: 2.5 mg via INTRAVENOUS
  Filled 2017-08-01 (×9): qty 2

## 2017-08-01 MED ORDER — PANTOPRAZOLE SODIUM 40 MG PO TBEC
40.0000 mg | DELAYED_RELEASE_TABLET | Freq: Every day | ORAL | Status: DC
  Administered 2017-08-01 – 2017-08-05 (×5): 40 mg via ORAL
  Filled 2017-08-01 (×5): qty 1

## 2017-08-01 MED ORDER — LIP MEDEX EX OINT
TOPICAL_OINTMENT | CUTANEOUS | Status: AC
  Administered 2017-08-01: 18:00:00
  Filled 2017-08-01: qty 7

## 2017-08-01 NOTE — Plan of Care (Signed)
  Coping: Level of anxiety will decrease 08/01/2017 0216 - Progressing by Antionette Char, RN

## 2017-08-01 NOTE — Progress Notes (Signed)
Patient ID: [name], female   DOB: 03-12-1986, 31 y.o.   MRN: [id number]  PROGRESS NOTE    [name]  WUJ:[id number] DOB: 1986-05-01 DOA: 07/31/2017  PCP: Lizbeth Bark, FNP   Brief Narrative:  31 year old female with history of alcohol abuse, cirrhosis, hypertension, anxiety and panic attacks who presented to ED with multiple complaints including constipation and rectal bleed, productive cough of yellow sputum, right side abdominal pain and withdrawal form alcohol use. She used to drink 15 cans of beer a day and recently cut down to 2. Alcohol level was 146 on admission. CXR showed no acute cardiopulmonary disease. CT angio chest was limited due to artifact. She is not tolerating ativan well so she was started on valium PRN and clonidine for detox.   Assessment & Plan:   Principal Problem:   Alcohol withdrawal (HCC) / Acute alcohol intoxication - CIWA protocol ordered but due to pt intolerance to ativan we are using valium PRN - Continue clonidine - Monitor for withdrawals - Monitor in SDU for next 24 hours   Active Problems:   Anxiety - Continue valium PRN    Hypertension, essential - Slightly hypertensive this am - Continue clonidine     Acute respiratory failure with hypoxia (HCC) - Stable - CXR without acute findings     Hyponatremia - Due to beer protomania - Monitor daily BMP    Hypokalemia - Due to alcohol abuse - Follow up BMP in am - Check magnesium level    Anemia of chronic disease - Due to bone marrow suppression from alcohol abuse     Polysubstance abuse (HCC) / Tobacco abuse - Counseled on cassation    Constipation /  Rectal bleeding - Rectal bleed likely due to constipation and hemorrhoids - Monitor daily CBC    DVT prophylaxis: SCD's Code Status: full code  Family Communication: no family at the bedside  Disposition Plan: monitor for withdrawals, not yet stable for discharge    Consultants:   None   Procedures:     None   Antimicrobials:   None    Subjective: Feels anxious.   Objective: Vitals:   08/01/17 1300 08/01/17 1400 08/01/17 1439 08/01/17 1500  BP:   (!) 150/94 (!) 155/94  Pulse: 86 88 81 80  Resp: (!) 21 17 (!) 21 (!) 23  Temp:      TempSrc:      SpO2: 94% 97% 96% 96%  Weight:      Height:        Intake/Output Summary (Last 24 hours) at 08/01/2017 1644 Last data filed at 08/01/2017 1500 Gross per 24 hour  Intake 2596.67 ml  Output 900 ml  Net 1696.67 ml   Filed Weights   07/31/17 1142  Weight: 65.8 kg (145 lb)    Examination:  General exam: Appears calm and comfortable  Respiratory system: Clear to auscultation. Respiratory effort normal. Cardiovascular system: S1 & S2 heard, tachycardic  Gastrointestinal system: Abdomen is nondistended, soft and nontender. No organomegaly or masses felt. Normal bowel sounds heard. Central nervous system: Alert and oriented. Tremulous Extremities: Symmetric 5 x 5 power. Skin: No rashes, lesions or ulcers Psychiatry: Judgement and insight appear normal. Mood & affect appropriate.   Data Reviewed: I have personally reviewed following labs and imaging studies  CBC: Recent Labs  Lab 07/31/17 1226 08/01/17 0157  WBC 7.4 5.2  NEUTROABS  --  4.5  HGB 10.2* 11.8*  HCT 27.1* 33.4*  MCV 90.9 96.8  PLT 196 177   Basic Metabolic Panel: Recent Labs  Lab 07/31/17 1325 08/01/17 0157  NA 132* 129*  K 4.1 3.3*  CL 105 100*  CO2 15* 16*  GLUCOSE 111* 309*  BUN 5* <5*  CREATININE 0.64 0.57  CALCIUM 7.6* 8.6*   GFR: Estimated Creatinine Clearance: 84.6 mL/min (by C-G formula based on SCr of 0.57 mg/dL). Liver Function Tests: Recent Labs  Lab 07/31/17 1325 08/01/17 0157  AST 429* 357*  ALT 144* 141*  ALKPHOS 231* 257*  BILITOT 5.2* 4.6*  PROT 4.4* 5.5*  ALBUMIN 2.7* 2.9*   Recent Labs  Lab 07/31/17 1325  LIPASE 24   No results for input(s): AMMONIA in the last 168 hours. Coagulation Profile: Recent Labs  Lab  07/31/17 1811  INR 1.07   Cardiac Enzymes: No results for input(s): CKTOTAL, CKMB, CKMBINDEX, TROPONINI in the last 168 hours. BNP (last 3 results) No results for input(s): PROBNP in the last 8760 hours. HbA1C: No results for input(s): HGBA1C in the last 72 hours. CBG: Recent Labs  Lab 08/01/17 0512 08/01/17 0806 08/01/17 1323 08/01/17 1635  GLUCAP 255* 182* 116* 119*   Lipid Profile: No results for input(s): CHOL, HDL, LDLCALC, TRIG, CHOLHDL, LDLDIRECT in the last 72 hours. Thyroid Function Tests: No results for input(s): TSH, T4TOTAL, FREET4, T3FREE, THYROIDAB in the last 72 hours. Anemia Panel: No results for input(s): VITAMINB12, FOLATE, FERRITIN, TIBC, IRON, RETICCTPCT in the last 72 hours. Urine analysis:    Component Value Date/Time   COLORURINE AMBER (A) 07/31/2017 1432   APPEARANCEUR CLOUDY (A) 07/31/2017 1432   LABSPEC 1.020 07/31/2017 1432   PHURINE 5.5 07/31/2017 1432   GLUCOSEU NEGATIVE 07/31/2017 1432   HGBUR NEGATIVE 07/31/2017 1432   BILIRUBINUR MODERATE (A) 07/31/2017 1432   KETONESUR NEGATIVE 07/31/2017 1432   PROTEINUR NEGATIVE 07/31/2017 1432   UROBILINOGEN 0.2 02/23/2013 1447   NITRITE POSITIVE (A) 07/31/2017 1432   LEUKOCYTESUR NEGATIVE 07/31/2017 1432   Sepsis Labs: @LABRCNTIP (procalcitonin:4,lacticidven:4)    Recent Results (from the past 240 hour(s))  MRSA PCR Screening     Status: None   Collection Time: 07/31/17 11:50 PM  Result Value Ref Range Status   MRSA by PCR NEGATIVE NEGATIVE Final    Comment:        The GeneXpert MRSA Assay (FDA approved for NASAL specimens only), is one component of a comprehensive MRSA colonization surveillance program. It is not intended to diagnose MRSA infection nor to guide or monitor treatment for MRSA infections.       Radiology Studies: Dg Chest 2 View  Result Date: 07/31/2017 CLINICAL DATA:  Pt has been having SOB, cough, nausea, vomiting, trouble urinating X 6 days. EXAM: CHEST  2 VIEW  COMPARISON:  05/28/2011 FINDINGS: The heart size and mediastinal contours are within normal limits. Both lungs are clear. No pleural effusion or pneumothorax. The visualized skeletal structures are unremarkable. IMPRESSION: Normal chest radiographs. Electronically Signed   By: Amie Portland M.D.   On: 07/31/2017 12:31   Ct Angio Chest Pe W And/or Wo Contrast  Result Date: 07/31/2017 CLINICAL DATA:  Shortness of breath. Hypoxia. Wheezing and bronchitis. EXAM: CT ANGIOGRAPHY CHEST WITH CONTRAST TECHNIQUE: Multidetector CT imaging of the chest was performed using the standard protocol during bolus administration of intravenous contrast. Multiplanar CT image reconstructions and MIPs were obtained to evaluate the vascular anatomy. CONTRAST:  ISOVUE-370 IOPAMIDOL (ISOVUE-370) INJECTION 76% COMPARISON:  Chest x-ray from earlier tonight FINDINGS: Cardiovascular: The heart size is normal. The thoracic aorta demonstrates  no atherosclerosis, aneurysm, or dissection. Evaluation for pulmonary emboli is somewhat limited due to respiratory motion and stairstep artifact. Evaluation in the right upper lobe is particularly limited due to streak artifact off of dense contrast in the SVC. Some regions of decreased attenuation in the right upper lobe pulmonary arteries is favored to be artifactual rather than emboli. No other evidence of emboli seen. Mediastinum/Nodes: No enlarged mediastinal, hilar, or axillary lymph nodes. Thyroid gland, trachea, and esophagus demonstrate no significant findings. Lungs/Pleura: Central airways are normal. No pneumothorax. No pulmonary nodules, masses, or focal infiltrates identified. Upper Abdomen: PICC steatosis is identified. No acute abnormalities are seen in the upper abdomen. Musculoskeletal: No chest wall abnormality. No acute or significant osseous findings. Review of the MIP images confirms the above findings. IMPRESSION: 1. Evaluation of pulmonary emboli is limited in the right upper  lobe due to streak artifact off of dense contrast in the SVC. Elsewhere, there is limitation due to respiratory motion and stairstep artifact. Taking these artifacts into account, there is no convincing evidence of pulmonary emboli. 2. No acute abnormalities identified. Electronically Signed   By: Gerome Samavid  Williams III M.D   On: 07/31/2017 20:03      Scheduled Meds: . cloNIDine  0.1 mg Oral BID  . folic acid  1 mg Oral Daily  . [START ON 08/02/2017] Influenza vac split quadrivalent PF  0.5 mL Intramuscular Tomorrow-1000  . insulin aspart  0-9 Units Subcutaneous Q4H  . multivitamin with minerals  1 tablet Oral Daily  . nicotine  21 mg Transdermal Daily  . pantoprazole  40 mg Oral Daily  . [START ON 08/02/2017] pneumococcal 23 valent vaccine  0.5 mL Intramuscular Tomorrow-1000  . propranolol  20 mg Oral BID  . senna  1 tablet Oral Daily  . thiamine  100 mg Oral Daily   Or  . thiamine  100 mg Intravenous Daily   Continuous Infusions: . sodium chloride    . sodium chloride 1,000 mL (08/01/17 1228)     LOS: 1 day    Time spent: 25 minutes  Greater than 50% of the time spent on counseling and coordinating the care.   Manson PasseyAlma Terin Cragle, MD [facility] Pager [phone]  If 7PM-7AM, please contact night-coverage [email or web address] Password [email or web address] 08/01/2017, 4:44 PM

## 2017-08-02 LAB — GLUCOSE, CAPILLARY
GLUCOSE-CAPILLARY: 132 mg/dL — AB (ref 65–99)
GLUCOSE-CAPILLARY: 115 mg/dL — AB (ref 65–99)
GLUCOSE-CAPILLARY: 125 mg/dL — AB (ref 65–99)
Glucose-Capillary: 141 mg/dL — ABNORMAL HIGH (ref 65–99)
Glucose-Capillary: 137 mg/dL — ABNORMAL HIGH (ref 65–99)
Glucose-Capillary: 125 mg/dL — ABNORMAL HIGH (ref 65–99)
Glucose-Capillary: 106 mg/dL — ABNORMAL HIGH (ref 65–99)

## 2017-08-02 LAB — ABO/RH: ABO/RH(D): O NEG

## 2017-08-02 LAB — BASIC METABOLIC PANEL
Anion gap: 7 (ref 5–15)
CALCIUM: 8.7 mg/dL — AB (ref 8.9–10.3)
CHLORIDE: 104 mmol/L (ref 101–111)
CO2: 26 mmol/L (ref 22–32)
CREATININE: 0.68 mg/dL (ref 0.44–1.00)
GFR calc non Af Amer: >60 mL/min (ref >60)
GLUCOSE: 133 mg/dL — AB (ref 65–99)
Potassium: 3.2 mmol/L — ABNORMAL LOW (ref 3.5–5.1)
Sodium: 137 mmol/L (ref 135–145)

## 2017-08-02 LAB — CBC
HEMATOCRIT: 31 % — AB (ref 36.0–46.0)
HEMOGLOBIN: 10.5 g/dL — AB (ref 12.0–15.0)
MCH: 33.1 pg (ref 26.0–34.0)
MCHC: 33.9 g/dL (ref 30.0–36.0)
MCV: 97.8 fL (ref 78.0–100.0)
Platelets: 146 10*3/uL — ABNORMAL LOW (ref 150–400)
RBC: 3.17 MIL/uL — ABNORMAL LOW (ref 3.87–5.11)
RDW: 17.5 % — AB (ref 11.5–15.5)
WBC: 5.6 10*3/uL (ref 4.0–10.5)

## 2017-08-02 LAB — TYPE AND SCREEN
ABO/RH(D): O NEG
ANTIBODY SCREEN: NEGATIVE

## 2017-08-02 LAB — MAGNESIUM: Magnesium: 1.7 mg/dL (ref 1.7–2.4)

## 2017-08-02 MED ORDER — POTASSIUM CHLORIDE CRYS ER 20 MEQ PO TBCR
40.0000 meq | EXTENDED_RELEASE_TABLET | Freq: Once | ORAL | Status: AC
  Administered 2017-08-02: 40 meq via ORAL
  Filled 2017-08-02: qty 2

## 2017-08-02 NOTE — Progress Notes (Signed)
Patient ID: [name], female   DOB: 26-May-1986, 31 y.o.   MRN: [id number]  PROGRESS NOTE    [name]  WUJ:[id number] DOB: 31-Mar-1986 DOA: 07/31/2017  PCP: Lizbeth Bark, FNP   Brief Narrative:  31 year old female with history of alcohol abuse, cirrhosis, hypertension, anxiety and panic attacks who presented to ED with multiple complaints including constipation and rectal bleed, productive cough of yellow sputum, right side abdominal pain and withdrawal form alcohol use. She used to drink 15 cans of beer a day and recently cut down to 2. Alcohol level was 146 on admission. CXR showed no acute cardiopulmonary disease. CT angio chest was limited due to artifact. She is not tolerating ativan well so she was started on valium PRN and clonidine for detox.   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 - Sodium WNL this am    Hypokalemia - Due to alcohol abuse - Supplemented - Potassium 3.2 this am so we supplemented it again - Magnesium is WNL     Anemia of chronic disease - Due to bone marrow suppression from alcohol abuse  - Hemoglobin 10.5 this am    Polysubstance abuse (HCC) / Tobacco abuse - Counseled on cassation    Constipation /  Rectal bleeding - Rectal bleed likely due to constipation and hemorrhoids - Hemoglobin stable at 10.5    DVT prophylaxis: SCD's Code Status: full code  Family Communication: no family at  the bedside  Disposition Plan: transfer to telemetry floor today    Consultants:   None   Procedures:   None   Antimicrobials:   None    Subjective: Feels anxious.   Objective: Vitals:   08/02/17 0400 08/02/17 0500 08/02/17 0600 08/02/17 0700  BP:      Pulse: (!) 54 (!) 57 (!) 54 63  Resp: 19 15 19  (!) 21  Temp: 98.2 F (36.8 C)     TempSrc: Oral     SpO2: 95% 95% 94% 93%  Weight:      Height:        Intake/Output Summary (Last 24 hours) at 08/02/2017 0724 Last data filed at 08/02/2017 0700 Gross per 24 hour  Intake 2451.67 ml  Output 1300 ml  Net 1151.67 ml   Filed Weights   07/31/17 1142  Weight: 65.8 kg (145 lb)    Physical Exam  Constitutional: Appears well-developed and well-nourished. No distress.  Neck: Normal ROM. Neck supple. No JVD. CVS: RRR, S1/S2 +, no murmurs, no gallops, no carotid bruit.  Pulmonary: Effort and breath sounds normal, no stridor, rhonchi, wheezes, rales.  Abdominal: Soft. BS +,  no distension, tenderness, rebound or guarding.  Musculoskeletal: Normal range of motion. No edema and no tenderness.  Lymphadenopathy: No lymphadenopathy noted, cervical, inguinal. Neuro: Alert. Normal reflexes, muscle tone coordination. No cranial nerve deficit. Skin: Skin is warm and dry. No rash noted. Not diaphoretic. No erythema. No pallor.  Psychiatric: Normal mood and affect.    Data Reviewed: I have personally reviewed following labs and imaging studies  CBC: Recent Labs  Lab 07/31/17 1226 08/01/17 0157 08/02/17 0332  WBC 7.4 5.2 5.6  NEUTROABS  --  4.5  --   HGB 10.2* 11.8* 10.5*  HCT 27.1* 33.4* 31.0*  MCV 90.9 96.8 97.8  PLT 196 177 146*   Basic Metabolic Panel: Recent Labs  Lab 07/31/17 1325 08/01/17 0157 08/02/17 0332  NA 132* 129* 137  K 4.1 3.3* 3.2*  CL 105 100* 104  CO2 15* 16* 26  GLUCOSE 111* 309* 133*  BUN 5* <5* <5*  CREATININE 0.64 0.57 0.68  CALCIUM 7.6* 8.6* 8.7*  MG  --   --  1.7   GFR: Estimated  Creatinine Clearance: 84.6 mL/min (by C-G formula based on SCr of 0.68 mg/dL). Liver Function Tests: Recent Labs  Lab 07/31/17 1325 08/01/17 0157  AST 429* 357*  ALT 144* 141*  ALKPHOS 231* 257*  BILITOT 5.2* 4.6*  PROT 4.4* 5.5*  ALBUMIN 2.7* 2.9*   Recent Labs  Lab 07/31/17 1325  LIPASE 24   No results for input(s): AMMONIA in the last 168 hours. Coagulation Profile: Recent Labs  Lab 07/31/17 1811  INR 1.07   Cardiac Enzymes: No results for input(s): CKTOTAL, CKMB, CKMBINDEX, TROPONINI in the last 168 hours. BNP (last 3 results) No results for input(s): PROBNP in the last 8760 hours. HbA1C: Recent Labs    08/01/17 0515  HGBA1C 5.6   CBG: Recent Labs  Lab 08/01/17 1323 08/01/17 1635 08/01/17 2026 08/01/17 2355 08/02/17 0410  GLUCAP 116* 119* 114* 141* 132*   Lipid Profile: No results for input(s): CHOL, HDL, LDLCALC, TRIG, CHOLHDL, LDLDIRECT in the last 72 hours. Thyroid Function Tests: No results for input(s): TSH, T4TOTAL, FREET4, T3FREE, THYROIDAB in the last 72 hours. Anemia Panel: No results for input(s): VITAMINB12, FOLATE, FERRITIN, TIBC, IRON, RETICCTPCT in the last 72 hours. Urine analysis:    Component Value Date/Time   COLORURINE AMBER (A) 07/31/2017 1432   APPEARANCEUR CLOUDY (A) 07/31/2017 1432   LABSPEC 1.020 07/31/2017 1432   PHURINE 5.5 07/31/2017 1432   GLUCOSEU NEGATIVE 07/31/2017 1432   HGBUR NEGATIVE 07/31/2017 1432   BILIRUBINUR MODERATE (A) 07/31/2017 1432   KETONESUR NEGATIVE 07/31/2017 1432   PROTEINUR NEGATIVE 07/31/2017 1432   UROBILINOGEN 0.2 02/23/2013 1447   NITRITE POSITIVE (A) 07/31/2017 1432   LEUKOCYTESUR NEGATIVE 07/31/2017 1432   Sepsis Labs: @LABRCNTIP (procalcitonin:4,lacticidven:4)    Recent Results (from the past 240 hour(s))  MRSA PCR Screening     Status: None   Collection Time: 07/31/17 11:50 PM  Result Value Ref Range Status   MRSA by PCR NEGATIVE NEGATIVE Final    Comment:        The GeneXpert  MRSA Assay (FDA approved for NASAL specimens only), is one component of a comprehensive MRSA colonization surveillance program. It is not intended to diagnose MRSA infection nor to guide or monitor treatment for MRSA infections.       Radiology Studies: Dg Chest 2 View  Result Date: 07/31/2017 CLINICAL DATA:  Pt has been having SOB, cough, nausea, vomiting, trouble urinating X 6 days. EXAM: CHEST  2 VIEW COMPARISON:  05/28/2011 FINDINGS: The heart size and mediastinal contours are within normal limits. Both lungs are clear. No pleural effusion or pneumothorax. The visualized skeletal structures are unremarkable. IMPRESSION: Normal chest radiographs. Electronically Signed   By: Amie Portland M.D.   On: 07/31/2017 12:31  Ct Angio Chest Pe W And/or Wo Contrast  Result Date: 07/31/2017 CLINICAL DATA:  Shortness of breath. Hypoxia. Wheezing and bronchitis. EXAM: CT ANGIOGRAPHY CHEST WITH CONTRAST TECHNIQUE: Multidetector CT imaging of the chest was performed using the standard protocol during bolus administration of intravenous contrast. Multiplanar CT image reconstructions and MIPs were obtained to evaluate the vascular anatomy. CONTRAST:  ISOVUE-370 IOPAMIDOL (ISOVUE-370) INJECTION 76% COMPARISON:  Chest x-ray from earlier tonight FINDINGS: Cardiovascular: The heart size is normal. The thoracic aorta demonstrates no atherosclerosis, aneurysm, or dissection. Evaluation for pulmonary emboli is somewhat limited due to respiratory motion and stairstep artifact. Evaluation in the right upper lobe is particularly limited due to streak artifact off of dense contrast in the SVC. Some regions of decreased attenuation in the right upper lobe pulmonary arteries is favored to be artifactual rather than emboli. No other evidence of emboli seen. Mediastinum/Nodes: No enlarged mediastinal, hilar, or axillary lymph nodes. Thyroid gland, trachea, and esophagus demonstrate no significant findings.  Lungs/Pleura: Central airways are normal. No pneumothorax. No pulmonary nodules, masses, or focal infiltrates identified. Upper Abdomen: PICC steatosis is identified. No acute abnormalities are seen in the upper abdomen. Musculoskeletal: No chest wall abnormality. No acute or significant osseous findings. Review of the MIP images confirms the above findings. IMPRESSION: 1. Evaluation of pulmonary emboli is limited in the right upper lobe due to streak artifact off of dense contrast in the SVC. Elsewhere, there is limitation due to respiratory motion and stairstep artifact. Taking these artifacts into account, there is no convincing evidence of pulmonary emboli. 2. No acute abnormalities identified. Electronically Signed   By: Gerome Sam III M.D   On: 07/31/2017 20:03   US Abdomen Complete  Result Date: 08/01/2017 CLINICAL DATA:  RIGHT upper quadrant pain for 1 year. Elevated liver function tests. History of polysubstance abuse. EXAM: ABDOMEN ULTRASOUND COMPLETE COMPARISON:  CT abdomen and pelvis March 05, 2017 FINDINGS: Gallbladder: Mildly echogenic layering gallbladder sludge. No gallbladder wall thickening or pericholecystic fluid. No sonographic Murphy's sign elicited. Common bile duct: Diameter: 5 mm Liver: Mildly echogenic. No intrahepatic biliary dilatation. Portal vein is patent on color Doppler imaging with normal direction of blood flow towards the liver. IVC: No abnormality visualized. Pancreas: Visualized portion unremarkable. Spleen: Size and appearance within normal limits. Right Kidney: Length: 11.1 cm. Echogenicity within normal limits. No mass or hydronephrosis visualized. Left Kidney: Length: 11.4 cm. Echogenicity within normal limits. 5 mm echogenic focus LEFT kidney with acoustic shadowing with kiss consistent with nephrolithiasis. No mass or hydronephrosis visualized. Abdominal aorta: No aneurysm visualized. Other findings: None. IMPRESSION: 1. Gallbladder sludge without sonographic  findings of acute cholecystitis. 2. Echogenic liver seen with hepatocellular disease/steatosis. 3. 5 mm nonobstructing LEFT nephrolithiasis. Electronically Signed   By: Awilda Metro M.D.   On: 08/01/2017 17:20      Scheduled Meds: . cloNIDine  0.1 mg Oral BID  . folic acid  1 mg Oral Daily  . Influenza vac split quadrivalent PF  0.5 mL Intramuscular Tomorrow-1000  . insulin aspart  0-9 Units Subcutaneous Q4H  . multivitamin with minerals  1 tablet Oral Daily  . nicotine  21 mg Transdermal Daily  . pantoprazole  40 mg Oral Daily  . pneumococcal 23 valent vaccine  0.5 mL Intramuscular Tomorrow-1000  . potassium chloride  40 mEq Oral Once  . propranolol  20 mg Oral BID  . senna  1 tablet Oral Daily  . thiamine  100 mg Oral Daily   Or  . thiamine  100 mg Intravenous Daily   Continuous Infusions: . sodium chloride    . sodium chloride 100 mL/hr at 08/02/17 0700     LOS: 2 days    Time spent: 25 minutes  Greater than 50% of the time spent on counseling and coordinating the care.   Manson PasseyAlma Danijela Vessey, MD [facility] Pager [phone]  If 7PM-7AM, please contact night-coverage [email or web address] Password [email or web address] 08/02/2017, 7:24 AM

## 2017-08-02 NOTE — Care Management Note (Signed)
Case Management Note  Patient Details  Name: [name] MRN: [id number] Date of Birth: [birth year]  Subjective/Objective:                  etoh abuse and cirrhosis   Action/Plan: Will follow for cm needs  Expected Discharge Date:  08/02/17               Expected Discharge Plan:  Home/Self Care  In-House Referral:     Discharge planning Services  CM Consult  Post Acute Care Choice:    Choice offered to:     DME Arranged:    DME Agency:     HH Arranged:    HH Agency:     Status of Service:  In process, will continue to follow  If discussed at Long Length of Stay Meetings, dates discussed:    Additional Comments:  Golda Acre, RN 08/02/2017, 8:38 AM

## 2017-08-03 LAB — GLUCOSE, CAPILLARY
GLUCOSE-CAPILLARY: 102 mg/dL — AB (ref 65–99)
GLUCOSE-CAPILLARY: 108 mg/dL — AB (ref 65–99)
Glucose-Capillary: 117 mg/dL — ABNORMAL HIGH (ref 65–99)
Glucose-Capillary: 115 mg/dL — ABNORMAL HIGH (ref 65–99)
Glucose-Capillary: 105 mg/dL — ABNORMAL HIGH (ref 65–99)

## 2017-08-03 LAB — BASIC METABOLIC PANEL
ANION GAP: 7 (ref 5–15)
BUN: 8 mg/dL (ref 6–20)
CO2: 29 mmol/L (ref 22–32)
Calcium: 9.1 mg/dL (ref 8.9–10.3)
Chloride: 99 mmol/L — ABNORMAL LOW (ref 101–111)
Creatinine, Ser: 0.71 mg/dL (ref 0.44–1.00)
GFR calc Af Amer: >60 mL/min (ref >60)
GLUCOSE: 108 mg/dL — AB (ref 65–99)
POTASSIUM: 3.4 mmol/L — AB (ref 3.5–5.1)
Sodium: 135 mmol/L (ref 135–145)

## 2017-08-03 LAB — HEPATITIS PANEL, ACUTE
Hep A IgM: NEGATIVE
Hep B C IgM: NEGATIVE
Hepatitis B Surface Ag: NEGATIVE

## 2017-08-03 LAB — CBC
HEMATOCRIT: 33.2 % — AB (ref 36.0–46.0)
HEMOGLOBIN: 11 g/dL — AB (ref 12.0–15.0)
MCH: 32.9 pg (ref 26.0–34.0)
MCHC: 33.1 g/dL (ref 30.0–36.0)
MCV: 99.4 fL (ref 78.0–100.0)
Platelets: 136 10*3/uL — ABNORMAL LOW (ref 150–400)
RBC: 3.34 MIL/uL — ABNORMAL LOW (ref 3.87–5.11)
RDW: 17.7 % — ABNORMAL HIGH (ref 11.5–15.5)
WBC: 5 10*3/uL (ref 4.0–10.5)

## 2017-08-03 MED ORDER — POTASSIUM CHLORIDE CRYS ER 20 MEQ PO TBCR
40.0000 meq | EXTENDED_RELEASE_TABLET | Freq: Once | ORAL | Status: AC
  Administered 2017-08-03: 40 meq via ORAL
  Filled 2017-08-03: qty 2

## 2017-08-03 MED ORDER — DIAZEPAM 5 MG PO TABS
2.5000 mg | ORAL_TABLET | Freq: Four times a day (QID) | ORAL | Status: DC | PRN
  Administered 2017-08-03 – 2017-08-05 (×7): 5 mg via ORAL
  Filled 2017-08-03 (×7): qty 1

## 2017-08-03 NOTE — Progress Notes (Signed)
Patient ID: [name], female   DOB: 08-Jan-1986, 31 y.o.   MRN: [id number]  PROGRESS NOTE    [name]  [id number]:[id number] DOB: [birth year] DOA: 07/31/2017  PCP: Lizbeth Bark, FNP   Brief Narrative:  31 year old female with history of alcohol abuse, cirrhosis, hypertension, anxiety and panic attacks who presented to ED with multiple complaints including constipation and rectal bleed, productive cough of yellow sputum, right side abdominal pain and withdrawal form alcohol use. She used to drink 15 cans of beer a day and recently cut down to 2. Alcohol level was 146 on admission. CXR showed no acute cardiopulmonary disease. CT angio chest was limited due to artifact. She is not tolerating ativan well so she was started on valium PRN and clonidine for detox.   Assessment & Plan:   Principal Problem:   Alcohol withdrawal (HCC) / Acute alcohol intoxication - CIWA protocol ordered but due to pt intolerance to ativan we are using valium PRN - Does not tolerate ativan due to severe hallucinations - Transfer to telemetry today   Active Problems:   Anxiety - Valium PRN - Intolerant to ativan  - Stable     Hypertension, essential - BP stable     Acute respiratory failure with hypoxia (HCC) - CXR without acute findings  - Stable     Hyponatremia - Due to beer protomania - Sodium normalized with IV fluids     Hypokalemia - Due to alcohol abuse - Magnesium is WNL - Supplemented     Anemia of chronic disease - Due to bone marrow suppression from alcohol abuse  - Hgb stable, 11.0    Polysubstance abuse (HCC) / Tobacco abuse - Counseled on cessation     Constipation /  Rectal bleeding - Rectal bleed likely due to constipation and hemorrhoids - Resolved     DVT prophylaxis: SCD;s Code Status: full code  Family Communication: no family at bedside  Disposition Plan: d/c in am if potassium WNL and if LFT's improving    Consultants:   None   Procedures:    None   Antimicrobials:   None    Subjective: No overnight events.   Objective: Vitals:   08/03/17 0400 08/03/17 0500 08/03/17 0815 08/03/17 0900  BP:      Pulse: 63 (!) 57  (!) 58  Resp: 16 13  20   Temp:   (!) 97.5 F (36.4 C)   TempSrc:   Oral   SpO2: 94% 95%  95%  Weight:      Height:        Intake/Output Summary (Last 24 hours) at 08/03/2017 1110 Last data filed at 08/03/2017 0500 Gross per 24 hour  Intake 2200 ml  Output 300 ml  Net 1900 ml   Filed Weights   07/31/17 1142  Weight: 65.8 kg (145 lb)    Physical Exam  Constitutional: Appears well-developed and well-nourished. No distress.  CVS: RRR, S1/S2 (+)  Pulmonary: Effort and breath sounds normal, no stridor, rhonchi, wheezes, rales.  Abdominal: Soft. BS +,  no distension, tenderness, rebound or guarding.  Musculoskeletal: No edema and no tenderness.  Lymphadenopathy: No lymphadenopathy noted, cervical, inguinal. Neuro: Alert. No cranial nerve deficit. Little tremulous  Skin: Skin is warm and dry. No rash noted. Marland Kitchen  Psychiatric: Normal mood and affect. Behavior, judgment, thought content normal.    Data Reviewed: I have personally reviewed following labs and imaging studies  CBC: Recent Labs  Lab 07/31/17 1226 08/01/17 0157 08/02/17 5784  08/03/17 0358  WBC 7.4 5.2 5.6 5.0  NEUTROABS  --  4.5  --   --   HGB 10.2* 11.8* 10.5* 11.0*  HCT 27.1* 33.4* 31.0* 33.2*  MCV 90.9 96.8 97.8 99.4  PLT 196 177 146* 136*   Basic Metabolic Panel: Recent Labs  Lab 07/31/17 1325 08/01/17 0157 08/02/17 0332 08/03/17 0358  NA 132* 129* 137 135  K 4.1 3.3* 3.2* 3.4*  CL 105 100* 104 99*  CO2 15* 16* 26 29  GLUCOSE 111* 309* 133* 108*  BUN 5* <5* <5* 8  CREATININE 0.64 0.57 0.68 0.71  CALCIUM 7.6* 8.6* 8.7* 9.1  MG  --   --  1.7  --    GFR: Estimated Creatinine Clearance: 84.6 mL/min (by C-G formula based on SCr of 0.71 mg/dL). Liver Function Tests: Recent Labs  Lab 07/31/17 1325 08/01/17 0157   AST 429* 357*  ALT 144* 141*  ALKPHOS 231* 257*  BILITOT 5.2* 4.6*  PROT 4.4* 5.5*  ALBUMIN 2.7* 2.9*   Recent Labs  Lab 07/31/17 1325  LIPASE 24   No results for input(s): AMMONIA in the last 168 hours. Coagulation Profile: Recent Labs  Lab 07/31/17 1811  INR 1.07   Cardiac Enzymes: No results for input(s): CKTOTAL, CKMB, CKMBINDEX, TROPONINI in the last 168 hours. BNP (last 3 results) No results for input(s): PROBNP in the last 8760 hours. HbA1C: Recent Labs    08/01/17 0515  HGBA1C 5.6   CBG: Recent Labs  Lab 08/02/17 1704 08/02/17 1949 08/02/17 2323 08/03/17 0335 08/03/17 0822  GLUCAP 106* 115* 125* 117* 102*   Lipid Profile: No results for input(s): CHOL, HDL, LDLCALC, TRIG, CHOLHDL, LDLDIRECT in the last 72 hours. Thyroid Function Tests: No results for input(s): TSH, T4TOTAL, FREET4, T3FREE, THYROIDAB in the last 72 hours. Anemia Panel: No results for input(s): VITAMINB12, FOLATE, FERRITIN, TIBC, IRON, RETICCTPCT in the last 72 hours. Urine analysis:    Component Value Date/Time   COLORURINE AMBER (A) 07/31/2017 1432   APPEARANCEUR CLOUDY (A) 07/31/2017 1432   LABSPEC 1.020 07/31/2017 1432   PHURINE 5.5 07/31/2017 1432   GLUCOSEU NEGATIVE 07/31/2017 1432   HGBUR NEGATIVE 07/31/2017 1432   BILIRUBINUR MODERATE (A) 07/31/2017 1432   KETONESUR NEGATIVE 07/31/2017 1432   PROTEINUR NEGATIVE 07/31/2017 1432   UROBILINOGEN 0.2 02/23/2013 1447   NITRITE POSITIVE (A) 07/31/2017 1432   LEUKOCYTESUR NEGATIVE 07/31/2017 1432   Sepsis Labs: @LABRCNTIP (procalcitonin:4,lacticidven:4)    Recent Results (from the past 240 hour(s))  MRSA PCR Screening     Status: None   Collection Time: 07/31/17 11:50 PM  Result Value Ref Range Status   MRSA by PCR NEGATIVE NEGATIVE Final    Comment:        The GeneXpert MRSA Assay (FDA approved for NASAL specimens only), is one component of a comprehensive MRSA colonization surveillance program. It is not intended  to diagnose MRSA infection nor to guide or monitor treatment for MRSA infections.   Urine Culture     Status: Abnormal (Preliminary result)   Collection Time: 08/01/17  1:30 AM  Result Value Ref Range Status   Specimen Description URINE, RANDOM  Final   Special Requests NONE  Final   Culture >=100,000 COLONIES/mL ESCHERICHIA COLI (A)  Final   Report Status PENDING  Incomplete      Radiology Studies: Dg Chest 2 View  Result Date: 07/31/2017 CLINICAL DATA:  Pt has been having SOB, cough, nausea, vomiting, trouble urinating X 6 days. EXAM: CHEST  2 VIEW  COMPARISON:  05/28/2011 FINDINGS: The heart size and mediastinal contours are within normal limits. Both lungs are clear. No pleural effusion or pneumothorax. The visualized skeletal structures are unremarkable. IMPRESSION: Normal chest radiographs. Electronically Signed   By: Amie Portlandavid  Ormond M.D.   On: 07/31/2017 12:31   Ct Angio Chest Pe W And/or Wo Contrast  Result Date: 07/31/2017 CLINICAL DATA:  Shortness of breath. Hypoxia. Wheezing and bronchitis. EXAM: CT ANGIOGRAPHY CHEST WITH CONTRAST TECHNIQUE: Multidetector CT imaging of the chest was performed using the standard protocol during bolus administration of intravenous contrast. Multiplanar CT image reconstructions and MIPs were obtained to evaluate the vascular anatomy. CONTRAST:  100mL ISOVUE-370 IOPAMIDOL (ISOVUE-370) INJECTION 76% COMPARISON:  Chest x-ray from earlier tonight FINDINGS: Cardiovascular: The heart size is normal. The thoracic aorta demonstrates no atherosclerosis, aneurysm, or dissection. Evaluation for pulmonary emboli is somewhat limited due to respiratory motion and stairstep artifact. Evaluation in the right upper lobe is particularly limited due to streak artifact off of dense contrast in the SVC. Some regions of decreased attenuation in the right upper lobe pulmonary arteries is favored to be artifactual rather than emboli. No other evidence of emboli seen.  Mediastinum/Nodes: No enlarged mediastinal, hilar, or axillary lymph nodes. Thyroid gland, trachea, and esophagus demonstrate no significant findings. Lungs/Pleura: Central airways are normal. No pneumothorax. No pulmonary nodules, masses, or focal infiltrates identified. Upper Abdomen: PICC steatosis is identified. No acute abnormalities are seen in the upper abdomen. Musculoskeletal: No chest wall abnormality. No acute or significant osseous findings. Review of the MIP images confirms the above findings. IMPRESSION: 1. Evaluation of pulmonary emboli is limited in the right upper lobe due to streak artifact off of dense contrast in the SVC. Elsewhere, there is limitation due to respiratory motion and stairstep artifact. Taking these artifacts into account, there is no convincing evidence of pulmonary emboli. 2. No acute abnormalities identified. Electronically Signed   By: Gerome Samavid  Williams III M.D   On: 07/31/2017 20:03   [place] Abdomen Complete  Result Date: 08/01/2017 CLINICAL DATA:  RIGHT upper quadrant pain for 1 year. Elevated liver function tests. History of polysubstance abuse. EXAM: ABDOMEN ULTRASOUND COMPLETE COMPARISON:  CT abdomen and pelvis March 05, 2017 FINDINGS: Gallbladder: Mildly echogenic layering gallbladder sludge. No gallbladder wall thickening or pericholecystic fluid. No sonographic Murphy's sign elicited. Common bile duct: Diameter: 5 mm Liver: Mildly echogenic. No intrahepatic biliary dilatation. Portal vein is patent on color Doppler imaging with normal direction of blood flow towards the liver. IVC: No abnormality visualized. Pancreas: Visualized portion unremarkable. Spleen: Size and appearance within normal limits. Right Kidney: Length: 11.1 cm. Echogenicity within normal limits. No mass or hydronephrosis visualized. Left Kidney: Length: 11.4 cm. Echogenicity within normal limits. 5 mm echogenic focus LEFT kidney with acoustic shadowing with kiss consistent with nephrolithiasis. No mass  or hydronephrosis visualized. Abdominal aorta: No aneurysm visualized. Other findings: None. IMPRESSION: 1. Gallbladder sludge without sonographic findings of acute cholecystitis. 2. Echogenic liver seen with hepatocellular disease/steatosis. 3. 5 mm nonobstructing LEFT nephrolithiasis. Electronically Signed   By: Awilda Metroourtnay  Bloomer M.D.   On: 08/01/2017 17:20      Scheduled Meds: . cloNIDine  0.1 mg Oral BID  . folic acid  1 mg Oral Daily  . Influenza vac split quadrivalent PF  0.5 mL Intramuscular Tomorrow-1000  . insulin aspart  0-9 Units Subcutaneous Q4H  . multivitamin with minerals  1 tablet Oral Daily  . nicotine  21 mg Transdermal Daily  . pantoprazole  40 mg Oral Daily  .  pneumococcal 23 valent vaccine  0.5 mL Intramuscular Tomorrow-1000  . potassium chloride  40 mEq Oral Once  . propranolol  20 mg Oral BID  . senna  1 tablet Oral Daily  . thiamine  100 mg Oral Daily   Or  . thiamine  100 mg Intravenous Daily   Continuous Infusions: . sodium chloride    . sodium chloride 100 mL/hr at 08/03/17 0500     LOS: 3 days    Time spent: 25 minutes  Greater than 50% of the time spent on counseling and coordinating the care.   Manson Passey, MD [facility] Pager [phone]  If 7PM-7AM, please contact night-coverage [email or web address] Password [email or web address] 08/03/2017, 11:10 AM

## 2017-08-04 LAB — COMPREHENSIVE METABOLIC PANEL
ALK PHOS: 220 U/L — AB (ref 38–126)
ALT: 103 U/L — AB (ref 14–54)
AST: 147 U/L — ABNORMAL HIGH (ref 15–41)
Albumin: 2.7 g/dL — ABNORMAL LOW (ref 3.5–5.0)
Anion gap: 6 (ref 5–15)
BUN: 9 mg/dL (ref 6–20)
CALCIUM: 8.8 mg/dL — AB (ref 8.9–10.3)
CO2: 32 mmol/L (ref 22–32)
CREATININE: 0.71 mg/dL (ref 0.44–1.00)
Chloride: 97 mmol/L — ABNORMAL LOW (ref 101–111)
GLUCOSE: 118 mg/dL — AB (ref 65–99)
Potassium: 3.8 mmol/L (ref 3.5–5.1)
Sodium: 135 mmol/L (ref 135–145)
TOTAL PROTEIN: 5.7 g/dL — AB (ref 6.5–8.1)
Total Bilirubin: 2 mg/dL — ABNORMAL HIGH (ref 0.3–1.2)

## 2017-08-04 LAB — GLUCOSE, CAPILLARY
GLUCOSE-CAPILLARY: 113 mg/dL — AB (ref 65–99)
GLUCOSE-CAPILLARY: 119 mg/dL — AB (ref 65–99)
Glucose-Capillary: 128 mg/dL — ABNORMAL HIGH (ref 65–99)
Glucose-Capillary: 123 mg/dL — ABNORMAL HIGH (ref 65–99)
Glucose-Capillary: 107 mg/dL — ABNORMAL HIGH (ref 65–99)
Glucose-Capillary: 126 mg/dL — ABNORMAL HIGH (ref 65–99)

## 2017-08-04 LAB — CBC
HCT: 31.8 % — ABNORMAL LOW (ref 36.0–46.0)
Hemoglobin: 10.5 g/dL — ABNORMAL LOW (ref 12.0–15.0)
MCH: 32.8 pg (ref 26.0–34.0)
MCHC: 33 g/dL (ref 30.0–36.0)
MCV: 99.4 fL (ref 78.0–100.0)
PLATELETS: 131 10*3/uL — AB (ref 150–400)
RBC: 3.2 MIL/uL — AB (ref 3.87–5.11)
RDW: 17.8 % — ABNORMAL HIGH (ref 11.5–15.5)
WBC: 4.8 10*3/uL (ref 4.0–10.5)

## 2017-08-04 LAB — URINE CULTURE

## 2017-08-04 LAB — HIV ANTIBODY (ROUTINE TESTING W REFLEX): HIV Screen 4th Generation wRfx: NONREACTIVE

## 2017-08-04 NOTE — Progress Notes (Signed)
Patient ID: [name], female   DOB: Jan 11, 1986, 31 y.o.   MRN: [id number]  PROGRESS NOTE    [name]  WUJ:[id number] DOB: February 01, 1986 DOA: 07/31/2017  PCP: Lizbeth Bark, FNP   Brief Narrative:  31 year old female with history of alcohol abuse, cirrhosis, hypertension, anxiety and panic attacks who presented to ED with multiple complaints including constipation and rectal bleed, productive cough of yellow sputum, right side abdominal pain and withdrawal form alcohol use. She used to drink 15 cans of beer a day and recently cut down to 2. Alcohol level was 146 on admission. CXR showed no acute cardiopulmonary disease. CT angio chest was limited due to artifact. She is not tolerating ativan well so she was started on valium PRN and clonidine for detox.   Assessment & Plan:   Principal Problem:   Alcohol withdrawal (HCC) / Acute alcohol intoxication - CIWA protocol ordered but due to pt intolerance to ativan we are using valium PRN - Does not tolerate ativan due to severe hallucinations - Pt still tremulous this am, will monitor for next 24 hours   Active Problems:   Anxiety - Valium PRN - Intolerant to ativan  - Stable     Hypertension, essential - BP 140/85    Acute respiratory failure with hypoxia (HCC) - CXR without acute findings  - Stable     Hyponatremia - Due to beer protomania - Sodium improved with IV fluids     Hypokalemia - Due to alcohol abuse - Magnesium is WNL - Potassium normalized with supplementation     Anemia of chronic disease - Due to bone marrow suppression from alcohol abuse  - Hgb remains stable     Polysubstance abuse (HCC) / Tobacco abuse - Counseled on cessation     Constipation /  Rectal bleeding - Rectal bleed likely due to constipation and hemorrhoids - Resolved     DVT prophylaxis: SCD's Code Status: full code  Family Communication: no family at the bedside Disposition Plan: d/c in am, tremulous this  am   Consultants:   None  Procedures:   None   Antimicrobials:   None    Subjective: No overnight events.  Objective: Vitals:   08/03/17 1200 08/03/17 1635 08/03/17 2045 08/04/17 0453  BP:  (!) 142/92 134/80 140/85  Pulse:  (!) 57 60 60  Resp:  18 18 18   Temp: 97.6 F (36.4 C) 98.3 F (36.8 C) 98.1 F (36.7 C) 97.9 F (36.6 C)  TempSrc: Oral Oral Oral Oral  SpO2:  98% 98% 96%  Weight:  66.3 kg (146 lb 2.6 oz)    Height:  5' (1.524 m)      Intake/Output Summary (Last 24 hours) at 08/04/2017 0806 Last data filed at 08/03/2017 1700 Gross per 24 hour  Intake 240 ml  Output -  Net 240 ml   Filed Weights   07/31/17 1142 08/03/17 1635  Weight: 65.8 kg (145 lb) 66.3 kg (146 lb 2.6 oz)    Physical Exam  Constitutional: Appears well-developed and well-nourished. No distress.  CVS: RRR, S1/S2 + Pulmonary: Effort and breath sounds normal, no stridor, rhonchi, wheezes, rales.  Abdominal: Soft. BS +,  no distension, tenderness, rebound or guarding.  Musculoskeletal: Normal range of motion. No edema and no tenderness.  Lymphadenopathy: No lymphadenopathy noted, cervical, inguinal. Neuro: Alert. Tremulous  Skin: Skin is warm and dry. No rash noted.  Psychiatric: Normal mood and affect.    Data Reviewed: I have personally reviewed  following labs and imaging studies  CBC: Recent Labs  Lab 07/31/17 1226 08/01/17 0157 08/02/17 0332 08/03/17 0358 08/04/17 0508  WBC 7.4 5.2 5.6 5.0 4.8  NEUTROABS  --  4.5  --   --   --   HGB 10.2* 11.8* 10.5* 11.0* 10.5*  HCT 27.1* 33.4* 31.0* 33.2* 31.8*  MCV 90.9 96.8 97.8 99.4 99.4  PLT 196 177 146* 136* 131*   Basic Metabolic Panel: Recent Labs  Lab 07/31/17 1325 08/01/17 0157 08/02/17 0332 08/03/17 0358 08/04/17 0508  NA 132* 129* 137 135 135  K 4.1 3.3* 3.2* 3.4* 3.8  CL 105 100* 104 99* 97*  CO2 15* 16* 26 29 32  GLUCOSE 111* 309* 133* 108* 118*  BUN 5* <5* <5* 8 9  CREATININE 0.64 0.57 0.68 0.71 0.71   CALCIUM 7.6* 8.6* 8.7* 9.1 8.8*  MG  --   --  1.7  --   --    GFR: Estimated Creatinine Clearance: 84.9 mL/min (by C-G formula based on SCr of 0.71 mg/dL). Liver Function Tests: Recent Labs  Lab 07/31/17 1325 08/01/17 0157 08/04/17 0508  AST 429* 357* 147*  ALT 144* 141* 103*  ALKPHOS 231* 257* 220*  BILITOT 5.2* 4.6* 2.0*  PROT 4.4* 5.5* 5.7*  ALBUMIN 2.7* 2.9* 2.7*   Recent Labs  Lab 07/31/17 1325  LIPASE 24   No results for input(s): AMMONIA in the last 168 hours. Coagulation Profile: Recent Labs  Lab 07/31/17 1811  INR 1.07   Cardiac Enzymes: No results for input(s): CKTOTAL, CKMB, CKMBINDEX, TROPONINI in the last 168 hours. BNP (last 3 results) No results for input(s): PROBNP in the last 8760 hours. HbA1C: No results for input(s): HGBA1C in the last 72 hours. CBG: Recent Labs  Lab 08/03/17 1603 08/03/17 2043 08/04/17 0006 08/04/17 0440 08/04/17 0733  GLUCAP 115* 105* 119* 128* 123*   Lipid Profile: No results for input(s): CHOL, HDL, LDLCALC, TRIG, CHOLHDL, LDLDIRECT in the last 72 hours. Thyroid Function Tests: No results for input(s): TSH, T4TOTAL, FREET4, T3FREE, THYROIDAB in the last 72 hours. Anemia Panel: No results for input(s): VITAMINB12, FOLATE, FERRITIN, TIBC, IRON, RETICCTPCT in the last 72 hours. Urine analysis:    Component Value Date/Time   COLORURINE AMBER (A) 07/31/2017 1432   APPEARANCEUR CLOUDY (A) 07/31/2017 1432   LABSPEC 1.020 07/31/2017 1432   PHURINE 5.5 07/31/2017 1432   GLUCOSEU NEGATIVE 07/31/2017 1432   HGBUR NEGATIVE 07/31/2017 1432   BILIRUBINUR MODERATE (A) 07/31/2017 1432   KETONESUR NEGATIVE 07/31/2017 1432   PROTEINUR NEGATIVE 07/31/2017 1432   UROBILINOGEN 0.2 02/23/2013 1447   NITRITE POSITIVE (A) 07/31/2017 1432   LEUKOCYTESUR NEGATIVE 07/31/2017 1432   Sepsis Labs: @LABRCNTIP (procalcitonin:4,lacticidven:4)    Recent Results (from the past 240 hour(s))  MRSA PCR Screening     Status: None    Collection Time: 07/31/17 11:50 PM  Result Value Ref Range Status   MRSA by PCR NEGATIVE NEGATIVE Final    Comment:        The GeneXpert MRSA Assay (FDA approved for NASAL specimens only), is one component of a comprehensive MRSA colonization surveillance program. It is not intended to diagnose MRSA infection nor to guide or monitor treatment for MRSA infections.   Urine Culture     Status: Abnormal   Collection Time: 08/01/17  1:30 AM  Result Value Ref Range Status   Specimen Description URINE, RANDOM  Final   Special Requests NONE  Final   Culture >=100,000 COLONIES/mL ESCHERICHIA COLI (A)  Final   Report Status 08/04/2017 FINAL  Final   Organism ID, Bacteria ESCHERICHIA COLI (A)  Final      Susceptibility   Escherichia coli - MIC*    AMPICILLIN 8 SENSITIVE Sensitive     CEFAZOLIN <=4 SENSITIVE Sensitive     CEFTRIAXONE <=1 SENSITIVE Sensitive     CIPROFLOXACIN <=0.25 SENSITIVE Sensitive     GENTAMICIN <=1 SENSITIVE Sensitive     IMIPENEM <=0.25 SENSITIVE Sensitive     NITROFURANTOIN <=16 SENSITIVE Sensitive     TRIMETH/SULFA <=20 SENSITIVE Sensitive     AMPICILLIN/SULBACTAM 4 SENSITIVE Sensitive     PIP/TAZO <=4 SENSITIVE Sensitive     Extended ESBL NEGATIVE Sensitive     * >=100,000 COLONIES/mL ESCHERICHIA COLI      Radiology Studies: Dg Chest 2 View  Result Date: 07/31/2017 CLINICAL DATA:  Pt has been having SOB, cough, nausea, vomiting, trouble urinating X 6 days. EXAM: CHEST  2 VIEW COMPARISON:  05/28/2011 FINDINGS: The heart size and mediastinal contours are within normal limits. Both lungs are clear. No pleural effusion or pneumothorax. The visualized skeletal structures are unremarkable. IMPRESSION: Normal chest radiographs. Electronically Signed   By: Amie Portland M.D.   On: 07/31/2017 12:31   Ct Angio Chest Pe W And/or Wo Contrast  Result Date: 07/31/2017 CLINICAL DATA:  Shortness of breath. Hypoxia. Wheezing and bronchitis. EXAM: CT ANGIOGRAPHY CHEST WITH  CONTRAST TECHNIQUE: Multidetector CT imaging of the chest was performed using the standard protocol during bolus administration of intravenous contrast. Multiplanar CT image reconstructions and MIPs were obtained to evaluate the vascular anatomy. CONTRAST:  ISOVUE-370 IOPAMIDOL (ISOVUE-370) INJECTION 76% COMPARISON:  Chest x-ray from earlier tonight FINDINGS: Cardiovascular: The heart size is normal. The thoracic aorta demonstrates no atherosclerosis, aneurysm, or dissection. Evaluation for pulmonary emboli is somewhat limited due to respiratory motion and stairstep artifact. Evaluation in the right upper lobe is particularly limited due to streak artifact off of dense contrast in the SVC. Some regions of decreased attenuation in the right upper lobe pulmonary arteries is favored to be artifactual rather than emboli. No other evidence of emboli seen. Mediastinum/Nodes: No enlarged mediastinal, hilar, or axillary lymph nodes. Thyroid gland, trachea, and esophagus demonstrate no significant findings. Lungs/Pleura: Central airways are normal. No pneumothorax. No pulmonary nodules, masses, or focal infiltrates identified. Upper Abdomen: PICC steatosis is identified. No acute abnormalities are seen in the upper abdomen. Musculoskeletal: No chest wall abnormality. No acute or significant osseous findings. Review of the MIP images confirms the above findings. IMPRESSION: 1. Evaluation of pulmonary emboli is limited in the right upper lobe due to streak artifact off of dense contrast in the SVC. Elsewhere, there is limitation due to respiratory motion and stairstep artifact. Taking these artifacts into account, there is no convincing evidence of pulmonary emboli. 2. No acute abnormalities identified. Electronically Signed   By: Gerome Sam III M.D   On: 07/31/2017 20:03   US Abdomen Complete  Result Date: 08/01/2017 CLINICAL DATA:  RIGHT upper quadrant pain for 1 year. Elevated liver function tests. History of  polysubstance abuse. EXAM: ABDOMEN ULTRASOUND COMPLETE COMPARISON:  CT abdomen and pelvis March 05, 2017 FINDINGS: Gallbladder: Mildly echogenic layering gallbladder sludge. No gallbladder wall thickening or pericholecystic fluid. No sonographic Murphy's sign elicited. Common bile duct: Diameter: 5 mm Liver: Mildly echogenic. No intrahepatic biliary dilatation. Portal vein is patent on color Doppler imaging with normal direction of blood flow towards the liver. IVC: No abnormality visualized. Pancreas: Visualized portion unremarkable. Spleen: Size  and appearance within normal limits. Right Kidney: Length: 11.1 cm. Echogenicity within normal limits. No mass or hydronephrosis visualized. Left Kidney: Length: 11.4 cm. Echogenicity within normal limits. 5 mm echogenic focus LEFT kidney with acoustic shadowing with kiss consistent with nephrolithiasis. No mass or hydronephrosis visualized. Abdominal aorta: No aneurysm visualized. Other findings: None. IMPRESSION: 1. Gallbladder sludge without sonographic findings of acute cholecystitis. 2. Echogenic liver seen with hepatocellular disease/steatosis. 3. 5 mm nonobstructing LEFT nephrolithiasis. Electronically Signed   By: Awilda Metro M.D.   On: 08/01/2017 17:20      Scheduled Meds: . cloNIDine  0.1 mg Oral BID  . folic acid  1 mg Oral Daily  . insulin aspart  0-9 Units Subcutaneous Q4H  . multivitamin with minerals  1 tablet Oral Daily  . nicotine  21 mg Transdermal Daily  . pantoprazole  40 mg Oral Daily  . pneumococcal 23 valent vaccine  0.5 mL Intramuscular Tomorrow-1000  . propranolol  20 mg Oral BID  . senna  1 tablet Oral Daily  . thiamine  100 mg Oral Daily   Or  . thiamine  100 mg Intravenous Daily   Continuous Infusions: . sodium chloride    . sodium chloride 100 mL/hr at 08/03/17 0500     LOS: 4 days    Time spent: 25 minutes  Greater than 50% of the time spent on counseling and coordinating the care.   Manson Passey, MD [facility] Pager [phone]  If 7PM-7AM, please contact night-coverage [email or web address] Password [email or web address] 08/04/2017, 8:06 AM

## 2017-08-05 LAB — GLUCOSE, CAPILLARY
GLUCOSE-CAPILLARY: 107 mg/dL — AB (ref 65–99)
GLUCOSE-CAPILLARY: 109 mg/dL — AB (ref 65–99)
GLUCOSE-CAPILLARY: 110 mg/dL — AB (ref 65–99)
Glucose-Capillary: 126 mg/dL — ABNORMAL HIGH (ref 65–99)

## 2017-08-05 MED ORDER — PANTOPRAZOLE SODIUM 40 MG PO TBEC: 40.0000 mg | DELAYED_RELEASE_TABLET | Freq: Every day | ORAL | 0 refills | Status: DC

## 2017-08-05 MED ORDER — ADULT MULTIVITAMIN W/MINERALS CH: 1.0000 | ORAL_TABLET | Freq: Every day | ORAL | 0 refills | Status: DC

## 2017-08-05 MED ORDER — THIAMINE HCL 100 MG PO TABS: 100.0000 mg | ORAL_TABLET | Freq: Every day | ORAL | 0 refills | Status: DC

## 2017-08-05 MED ORDER — OXYCODONE HCL 5 MG PO TABS
2.5000 mg | ORAL_TABLET | ORAL | 0 refills | Status: DC | PRN
Start: 2017-08-05 — End: 2017-09-03

## 2017-08-05 MED ORDER — DIAZEPAM 5 MG PO TABS: 2.5000 mg | ORAL_TABLET | Freq: Four times a day (QID) | ORAL | 0 refills | Status: DC | PRN

## 2017-08-05 MED ORDER — FOLIC ACID 1 MG PO TABS: 1.0000 mg | ORAL_TABLET | Freq: Every day | ORAL | 0 refills | Status: DC

## 2017-08-05 NOTE — Discharge Summary (Signed)
Physician Discharge Summary  [name] LFY:[id number] DOB: 02/08/1986 DOA: 07/31/2017  PCP: Lizbeth Bark, FNP  Admit date: 07/31/2017 Discharge date: 08/05/2017  Recommendations for Outpatient Follow-up:  1. Outpt follow up with PCP in 1-2 weeks 2. Follow up CMP (LFTs)  Discharge Diagnoses:  Principal Problem:   Alcohol withdrawal (HCC) Active Problems:   Anxiety   Hypertension   Acute respiratory failure with hypoxia (HCC)   Polysubstance abuse (HCC)   Constipation   Anemia   Rectal bleeding    Discharge Condition: stable   Diet recommendation: as tolerated   History of present illness:  31 year old female with history of alcohol abuse, cirrhosis, hypertension, anxiety and panic attacks who presented to ED with multiple complaints including constipation and rectal bleed, productive cough of yellow sputum, right side abdominal pain and withdrawal form alcohol use. She used to drink 15 cans of beer a day and recently cut down to 2. Alcohol level was 146 on admission. CXR showed no acute cardiopulmonary disease. CT angio chest was limited due to artifact. She is not tolerating ativan well so she was started on valium PRN and clonidine for detox.  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 - LFTs improving  - Out follow up    Anxiety - Valium PRN - Intolerant to ativan  - Stable    Hypertension, essential - Stable     Acute respiratory failure with hypoxia (HCC) - CXR without acute findings  - Stable      Hyponatremia - Due to beer protomania - Sodium improved with IV fluids  - Stable    Hypokalemia - Due to alcohol abuse - Magnesium is WNL -  Electrolytes WNL    Anemia of chronic disease - Due to bone marrow suppression from alcohol abuse  - Stable     Polysubstance abuse (HCC) / Tobacco abuse - Counseled on cessation     Constipation /  Rectal bleeding - Rectal bleed likely due to constipation and hemorrhoids - Resolved     DVT prophylaxis: SCD's Code Status: full code  Family Communication: nbofrien at bedside; spoke with pt mom 12/11 and 12/12   Consultants:   None  Procedures:   None   Antimicrobials:   None      Signed:  Manson Passey, MD  [facility] 08/05/2017, 6:12 AM  Pager #: [phone]  Time spent in minutes: more than 30 minutes  Discharge Exam: Vitals:   08/04/17 2028 08/05/17 0438  BP: (!) 155/87 128/89  Pulse: (!) 55 (!) 58  Resp: 16 15  Temp: 97.8 F (36.6 C) 97.7 F (36.5 C)  SpO2: 98% 98%   Vitals:   08/04/17 0453 08/04/17 0935 08/04/17 2028 08/05/17 0438  BP: 140/85 127/84 (!) 155/87 128/89  Pulse: 60 (!) 57 (!) 55 (!) 58  Resp: 18 18 16 15   Temp: 97.9 F (36.6 C) 98.1 F (36.7 C) 97.8 F (36.6 C) 97.7 F (36.5 C)  TempSrc: Oral Oral Oral Oral  SpO2: 96% 96% 98% 98%  Weight:      Height:        General: Pt is alert, follows commands appropriately, not in  acute distress Cardiovascular: Regular rate and rhythm, S1/S2 + Respiratory: Clear to auscultation bilaterally, no wheezing, no crackles, no rhonchi Abdominal: Soft, non tender, non distended, bowel sounds +, no guarding Extremities: no edema, no cyanosis, pulses palpable bilaterally DP and PT Neuro: Grossly nonfocal  Discharge Instructions  Discharge Instructions    Call MD for:  persistant nausea and vomiting   Complete by:  As directed    Call MD for:  redness, tenderness, or signs of infection (pain, swelling, redness, odor or green/yellow discharge around incision site)   Complete by:  As directed    Call MD for:  severe uncontrolled pain   Complete by:  As directed    Diet -  low sodium heart healthy   Complete by:  As directed    Increase activity slowly   Complete by:  As directed      Allergies as of 08/05/2017      Reactions   Ibuprofen Anaphylaxis   Patient thinks it may have caused her throat to swell   Peanut-containing Drug Products Anaphylaxis   Acetaminophen    Causes  patient to have an upset stomach   Codeine Hives, Itching   Lisinopril Swelling   Penicillins Hives, Swelling   Has patient had a PCN reaction causing immediate rash, facial/tongue/throat swelling, SOB or lightheadedness with hypotension: Yes Has patient had a PCN reaction causing severe rash involving mucus membranes or skin necrosis: unknown Has patient had a PCN reaction that required hospitalization No Has patient had a PCN reaction occurring within the last 10 years: No If all of the above answers are "NO", then may proceed with Cephalosporin use.   Ativan [lorazepam] Anxiety   Hallucinations per patient      Medication List    STOP taking these medications   cephALEXin 500 MG capsule Commonly known as:  KEFLEX   cyclobenzaprine 10 MG tablet Commonly known as:  FLEXERIL   escitalopram 10 MG tablet Commonly known as:  LEXAPRO   famotidine 20 MG tablet Commonly known as:  PEPCID   hydrOXYzine 25 MG capsule Commonly known as:  VISTARIL   hydrOXYzine 25 MG tablet Commonly known as:  ATARAX/VISTARIL   ondansetron 4 MG tablet Commonly known as:  ZOFRAN     TAKE these medications   cloNIDine 0.1 MG tablet Commonly known as:  CATAPRES Take 1 tablet (0.1 mg total) by mouth 2 (two) times daily.   diazepam 5 MG tablet Commonly known as:  VALIUM Take 0.5-1 tablets (2.5-5 mg total) by mouth every 6 (six) hours as needed for anxiety.   folic acid 1 MG tablet Commonly known as:  FOLVITE Take 1 tablet (1 mg total) by mouth daily.   multivitamin with minerals Tabs tablet Take 1 tablet by mouth daily.   omeprazole 20 MG capsule Commonly known as:   PRILOSEC Take 1 capsule (20 mg total) by mouth daily.   oxyCODONE 5 MG immediate release tablet Commonly known as:  Oxy IR/ROXICODONE Take 0.5-1 tablets (2.5-5 mg total) by mouth every 4 (four) hours as needed for severe pain.   pantoprazole 40 MG tablet Commonly known as:  PROTONIX Take 1 tablet (40 mg total) by mouth daily.   propranolol 40 MG tablet Commonly known as:  INDERAL Take 1 tablet (40 mg total) by mouth 2 (two) times daily. What changed:  how much to take   thiamine 100 MG tablet Take 1 tablet (100 mg total) by mouth daily.      Follow-up Information  Lizbeth BarkHairston, Mandesia R, FNP. Schedule an appointment as soon as possible for a visit in 1 week(s).   Specialty:  Family Medicine Contact information: [address] [place] [address] [phone]            The results of significant diagnostics from this hospitalization (including imaging, microbiology, ancillary and laboratory) are listed below for reference.    Significant Diagnostic Studies: Dg Chest 2 View  Result Date: 07/31/2017 CLINICAL DATA:  Pt has been having SOB, cough, nausea, vomiting, trouble urinating X 6 days. EXAM: CHEST  2 VIEW COMPARISON:  05/28/2011 FINDINGS: The heart size and mediastinal contours are within normal limits. Both lungs are clear. No pleural effusion or pneumothorax. The visualized skeletal structures are unremarkable. IMPRESSION: Normal chest radiographs. Electronically Signed   By: Amie Portlandavid  Ormond M.D.   On: 07/31/2017 12:31   Ct Angio Chest Pe W And/or Wo Contrast  Result Date: 07/31/2017 CLINICAL DATA:  Shortness of breath. Hypoxia. Wheezing and bronchitis. EXAM: CT ANGIOGRAPHY CHEST WITH CONTRAST TECHNIQUE: Multidetector CT imaging of the chest was performed using the standard protocol during bolus administration of intravenous contrast. Multiplanar CT image reconstructions and MIPs were obtained to evaluate the vascular anatomy. CONTRAST:  100mL ISOVUE-370 IOPAMIDOL  (ISOVUE-370) INJECTION 76% COMPARISON:  Chest x-ray from earlier tonight FINDINGS: Cardiovascular: The heart size is normal. The thoracic aorta demonstrates no atherosclerosis, aneurysm, or dissection. Evaluation for pulmonary emboli is somewhat limited due to respiratory motion and stairstep artifact. Evaluation in the right upper lobe is particularly limited due to streak artifact off of dense contrast in the SVC. Some regions of decreased attenuation in the right upper lobe pulmonary arteries is favored to be artifactual rather than emboli. No other evidence of emboli seen. Mediastinum/Nodes: No enlarged mediastinal, hilar, or axillary lymph nodes. Thyroid gland, trachea, and esophagus demonstrate no significant findings. Lungs/Pleura: Central airways are normal. No pneumothorax. No pulmonary nodules, masses, or focal infiltrates identified. Upper Abdomen: PICC steatosis is identified. No acute abnormalities are seen in the upper abdomen. Musculoskeletal: No chest wall abnormality. No acute or significant osseous findings. Review of the MIP images confirms the above findings. IMPRESSION: 1. Evaluation of pulmonary emboli is limited in the right upper lobe due to streak artifact off of dense contrast in the SVC. Elsewhere, there is limitation due to respiratory motion and stairstep artifact. Taking these artifacts into account, there is no convincing evidence of pulmonary emboli. 2. No acute abnormalities identified. Electronically Signed   By: Gerome Samavid  Williams III M.D   On: 07/31/2017 20:03   [place] Abdomen Complete  Result Date: 08/01/2017 CLINICAL DATA:  RIGHT upper quadrant pain for 1 year. Elevated liver function tests. History of polysubstance abuse. EXAM: ABDOMEN ULTRASOUND COMPLETE COMPARISON:  CT abdomen and pelvis March 05, 2017 FINDINGS: Gallbladder: Mildly echogenic layering gallbladder sludge. No gallbladder wall thickening or pericholecystic fluid. No sonographic Murphy's sign elicited. Common bile  duct: Diameter: 5 mm Liver: Mildly echogenic. No intrahepatic biliary dilatation. Portal vein is patent on color Doppler imaging with normal direction of blood flow towards the liver. IVC: No abnormality visualized. Pancreas: Visualized portion unremarkable. Spleen: Size and appearance within normal limits. Right Kidney: Length: 11.1 cm. Echogenicity within normal limits. No mass or hydronephrosis visualized. Left Kidney: Length: 11.4 cm. Echogenicity within normal limits. 5 mm echogenic focus LEFT kidney with acoustic shadowing with kiss consistent with nephrolithiasis. No mass or hydronephrosis visualized. Abdominal aorta: No aneurysm visualized. Other findings: None. IMPRESSION: 1. Gallbladder sludge without sonographic findings of acute cholecystitis.  2. Echogenic liver seen with hepatocellular disease/steatosis. 3. 5 mm nonobstructing LEFT nephrolithiasis. Electronically Signed   By: Awilda Metro M.D.   On: 08/01/2017 17:20    Microbiology: Recent Results (from the past 240 hour(s))  MRSA PCR Screening     Status: None   Collection Time: 07/31/17 11:50 PM  Result Value Ref Range Status   MRSA by PCR NEGATIVE NEGATIVE Final    Comment:        The GeneXpert MRSA Assay (FDA approved for NASAL specimens only), is one component of a comprehensive MRSA colonization surveillance program. It is not intended to diagnose MRSA infection nor to guide or monitor treatment for MRSA infections.   Urine Culture     Status: Abnormal   Collection Time: 08/01/17  1:30 AM  Result Value Ref Range Status   Specimen Description URINE, RANDOM  Final   Special Requests NONE  Final   Culture >=100,000 COLONIES/mL ESCHERICHIA COLI (A)  Final   Report Status 08/04/2017 FINAL  Final   Organism ID, Bacteria ESCHERICHIA COLI (A)  Final      Susceptibility   Escherichia coli - MIC*    AMPICILLIN 8 SENSITIVE Sensitive     CEFAZOLIN <=4 SENSITIVE Sensitive     CEFTRIAXONE <=1 SENSITIVE Sensitive      CIPROFLOXACIN <=0.25 SENSITIVE Sensitive     GENTAMICIN <=1 SENSITIVE Sensitive     IMIPENEM <=0.25 SENSITIVE Sensitive     NITROFURANTOIN <=16 SENSITIVE Sensitive     TRIMETH/SULFA <=20 SENSITIVE Sensitive     AMPICILLIN/SULBACTAM 4 SENSITIVE Sensitive     PIP/TAZO <=4 SENSITIVE Sensitive     Extended ESBL NEGATIVE Sensitive     * >=100,000 COLONIES/mL ESCHERICHIA COLI     Labs: Basic Metabolic Panel: Recent Labs  Lab 07/31/17 1325 08/01/17 0157 08/02/17 0332 08/03/17 0358 08/04/17 0508  NA 132* 129* 137 135 135  K 4.1 3.3* 3.2* 3.4* 3.8  CL 105 100* 104 99* 97*  CO2 15* 16* 26 29 32  GLUCOSE 111* 309* 133* 108* 118*  BUN 5* <5* <5* 8 9  CREATININE 0.64 0.57 0.68 0.71 0.71  CALCIUM 7.6* 8.6* 8.7* 9.1 8.8*  MG  --   --  1.7  --   --    Liver Function Tests: Recent Labs  Lab 07/31/17 1325 08/01/17 0157 08/04/17 0508  AST 429* 357* 147*  ALT 144* 141* 103*  ALKPHOS 231* 257* 220*  BILITOT 5.2* 4.6* 2.0*  PROT 4.4* 5.5* 5.7*  ALBUMIN 2.7* 2.9* 2.7*   Recent Labs  Lab 07/31/17 1325  LIPASE 24   No results for input(s): AMMONIA in the last 168 hours. CBC: Recent Labs  Lab 07/31/17 1226 08/01/17 0157 08/02/17 0332 08/03/17 0358 08/04/17 0508  WBC 7.4 5.2 5.6 5.0 4.8  NEUTROABS  --  4.5  --   --   --   HGB 10.2* 11.8* 10.5* 11.0* 10.5*  HCT 27.1* 33.4* 31.0* 33.2* 31.8*  MCV 90.9 96.8 97.8 99.4 99.4  PLT 196 177 146* 136* 131*   Cardiac Enzymes: No results for input(s): CKTOTAL, CKMB, CKMBINDEX, TROPONINI in the last 168 hours. BNP: BNP (last 3 results) No results for input(s): BNP in the last 8760 hours.  ProBNP (last 3 results) No results for input(s): PROBNP in the last 8760 hours.  CBG: Recent Labs  Lab 08/04/17 1157 08/04/17 1637 08/04/17 2030 08/04/17 2342 08/05/17 0437  GLUCAP 113* 107* 126* 110* 126*

## 2017-08-05 NOTE — Discharge Instructions (Signed)
Alcohol Withdrawal °Alcohol withdrawal is a group of symptoms that can develop when a person who drinks heavily and regularly stops drinking or drinks less. °What are the causes? °Heavy and regular drinking can cause chemicals that send signals from the brain to the body (neurotransmitters) to deactivate. Alcohol withdrawal develops when deactivated neurotransmitters reactivate because a person stops drinking or drinks less. °What increases the risk? °The more a person drinks and the longer he or she drinks, the greater the risk of alcohol withdrawal. Severe withdrawal is more likely to develop in someone who: °· Had severe alcohol withdrawal in the past. °· Had a seizure during a previous episode of alcohol withdrawal. °· Is elderly. °· Is pregnant. °· Has been abusing drugs. °· Has other medical problems, including: °? Infection. °? Heart, lung, or liver disease. °? Seizures. °? Mental health problems. ° °What are the signs or symptoms? °Symptoms of this condition can be mild to moderate, or they can be severe. °Mild to moderate symptoms may include: °· Fatigue. °· Nightmares. °· Trouble sleeping. °· Depression. °· Anxiety. °· Inability to think clearly. °· Mood swings. °· Irritability. °· Loss of appetite. °· Nausea or vomiting. °· Clammy skin. °· Extreme sweating. °· Rapid heartbeat. °· Shakiness. °· Uncontrollable shaking (tremor). ° °Severe symptoms may include: °· Fever. °· Seizures. °· Severe confusion. °· Feeling or seeing things that are not there (hallucinations). ° °Symptoms usually begin within eight hours after a person stops drinking or drinks less. They can last for weeks. °How is this diagnosed? °Alcohol withdrawal is diagnosed with a medical history and physical exam. Sometimes, urine and blood tests are also done. °How is this treated? °Treatment may involve: °· Monitoring blood pressure, pulse, and breathing. °· Getting fluids through an IV tube. °· Medicine to reduce anxiety. °· Medicine to  prevent or control seizures. °· Multivitamins and B vitamins. °· Having a health care provider check on you daily. ° °If symptoms are moderate to severe or if there is a risk of severe withdrawal, treatment may be done at a hospital or treatment center. °Follow these instructions at home: °· Take medicines and vitamin supplements only as directed by your health care provider. °· Do not drink alcohol. °· Have someone stay with you or be available if you need help. °· Drink enough fluid to keep your urine clear or pale yellow. °· Consider joining a 12-step program or another alcohol support group. °Contact a health care provider if: °· Your symptoms get worse or do not go away. °· You cannot keep food or water in your stomach. °· You are struggling with not drinking alcohol. °· You cannot stop drinking alcohol. °Get help right away if: °· You have an irregular heartbeat. °· You have chest pain. °· You have trouble breathing. °· You have symptoms of severe withdrawal, such as: °? A fever. °? Seizures. °? Severe confusion. °? Hallucinations. °This information is not intended to replace advice given to you by your health care provider. Make sure you discuss any questions you have with your health care provider. °Document Released: 05/20/2005 Document Revised: 12/18/2015 Document Reviewed: 05/29/2014 °Elsevier Interactive Patient Education © 2018 [employer] ° °

## 2017-09-02 ENCOUNTER — Encounter (HOSPITAL_COMMUNITY): Payer: Self-pay

## 2017-09-02 DIAGNOSIS — Z79899 Other long term (current) drug therapy: Secondary | ICD-10-CM | POA: Insufficient documentation

## 2017-09-02 DIAGNOSIS — J209 Acute bronchitis, unspecified: Secondary | ICD-10-CM | POA: Insufficient documentation

## 2017-09-02 DIAGNOSIS — I10 Essential (primary) hypertension: Secondary | ICD-10-CM | POA: Insufficient documentation

## 2017-09-02 DIAGNOSIS — N3001 Acute cystitis with hematuria: Secondary | ICD-10-CM | POA: Insufficient documentation

## 2017-09-02 DIAGNOSIS — F17210 Nicotine dependence, cigarettes, uncomplicated: Secondary | ICD-10-CM | POA: Insufficient documentation

## 2017-09-02 DIAGNOSIS — K2920 Alcoholic gastritis without bleeding: Secondary | ICD-10-CM | POA: Insufficient documentation

## 2017-09-02 DIAGNOSIS — F1010 Alcohol abuse, uncomplicated: Secondary | ICD-10-CM | POA: Insufficient documentation

## 2017-09-02 LAB — CBC
HCT: 50.3 % — ABNORMAL HIGH (ref 36.0–46.0)
HEMOGLOBIN: 17.7 g/dL — AB (ref 12.0–15.0)
MCH: 34.6 pg — ABNORMAL HIGH (ref 26.0–34.0)
MCHC: 35.2 g/dL (ref 30.0–36.0)
MCV: 98.4 fL (ref 78.0–100.0)
PLATELETS: 166 10*3/uL (ref 150–400)
RBC: 5.11 MIL/uL (ref 3.87–5.11)
RDW: 15.4 % (ref 11.5–15.5)
WBC: 8.3 10*3/uL (ref 4.0–10.5)

## 2017-09-02 LAB — URINALYSIS, ROUTINE W REFLEX MICROSCOPIC
BILIRUBIN URINE: NEGATIVE
GLUCOSE, UA: NEGATIVE mg/dL
KETONES UR: NEGATIVE mg/dL
NITRITE: POSITIVE — AB
PH: 6 (ref 5.0–8.0)
Protein, ur: NEGATIVE mg/dL
SPECIFIC GRAVITY, URINE: 1.013 (ref 1.005–1.030)

## 2017-09-02 LAB — COMPREHENSIVE METABOLIC PANEL
ALK PHOS: 285 U/L — AB (ref 38–126)
ALT: 111 U/L — AB (ref 14–54)
ANION GAP: 14 (ref 5–15)
AST: 308 U/L — ABNORMAL HIGH (ref 15–41)
Albumin: 3.3 g/dL — ABNORMAL LOW (ref 3.5–5.0)
BILIRUBIN TOTAL: 1.6 mg/dL — AB (ref 0.3–1.2)
BUN: 6 mg/dL (ref 6–20)
CO2: 16 mmol/L — ABNORMAL LOW (ref 22–32)
CREATININE: 0.65 mg/dL (ref 0.44–1.00)
Calcium: 8.4 mg/dL — ABNORMAL LOW (ref 8.9–10.3)
Chloride: 101 mmol/L (ref 101–111)
Glucose, Bld: 109 mg/dL — ABNORMAL HIGH (ref 65–99)
Potassium: 4.5 mmol/L (ref 3.5–5.1)
SODIUM: 131 mmol/L — AB (ref 135–145)
TOTAL PROTEIN: 6.7 g/dL (ref 6.5–8.1)

## 2017-09-02 LAB — RAPID URINE DRUG SCREEN, HOSP PERFORMED
AMPHETAMINES: NOT DETECTED
BARBITURATES: NOT DETECTED
BENZODIAZEPINES: POSITIVE — AB
COCAINE: POSITIVE — AB
Opiates: NOT DETECTED
Tetrahydrocannabinol: NOT DETECTED

## 2017-09-02 LAB — I-STAT BETA HCG BLOOD, ED (MC, WL, AP ONLY): I-stat hCG, quantitative: <5 m[IU]/mL (ref <5)

## 2017-09-02 LAB — LIPASE, BLOOD: Lipase: 27 U/L (ref 11–51)

## 2017-09-02 LAB — ETHANOL

## 2017-09-02 NOTE — ED Triage Notes (Signed)
Pt complains of urinary retention, hematuria and vomiting blood Pt was released from ICU before Christmas with cirrhoses and hematuria Pt said she started drinking again right after Christmas and she's been vomiting blood for a few days She also has severe anxiety which caused her to start to drinking Pt denies SI/HI

## 2017-09-03 ENCOUNTER — Emergency Department (HOSPITAL_COMMUNITY)
Admission: EM | Admit: 2017-09-03 | Discharge: 2017-09-03 | Disposition: A | Discharge status: Home / Self Care | Payer: Self-pay | Attending: Emergency Medicine | Admitting: Emergency Medicine

## 2017-09-03 ENCOUNTER — Emergency Department (HOSPITAL_COMMUNITY): Payer: Self-pay

## 2017-09-03 ENCOUNTER — Encounter (HOSPITAL_COMMUNITY): Payer: Self-pay

## 2017-09-03 DIAGNOSIS — K2920 Alcoholic gastritis without bleeding: Secondary | ICD-10-CM

## 2017-09-03 DIAGNOSIS — F1010 Alcohol abuse, uncomplicated: Secondary | ICD-10-CM

## 2017-09-03 DIAGNOSIS — N3001 Acute cystitis with hematuria: Secondary | ICD-10-CM

## 2017-09-03 DIAGNOSIS — J209 Acute bronchitis, unspecified: Secondary | ICD-10-CM

## 2017-09-03 LAB — POC OCCULT BLOOD, ED: Fecal Occult Bld: POSITIVE — AB

## 2017-09-03 MED ORDER — METHYLPREDNISOLONE SODIUM SUCC 125 MG IJ SOLR
125.0000 mg | Freq: Once | INTRAMUSCULAR | Status: AC
  Administered 2017-09-03: 125 mg via INTRAVENOUS
  Filled 2017-09-03: qty 2

## 2017-09-03 MED ORDER — ONDANSETRON 4 MG PO TBDP: 4.0000 mg | ORAL_TABLET | Freq: Three times a day (TID) | ORAL | 0 refills | Status: DC | PRN

## 2017-09-03 MED ORDER — DEXTROSE 5 % IV SOLN
1.0000 g | Freq: Once | INTRAVENOUS | Status: AC
  Administered 2017-09-03: 1 g via INTRAVENOUS
  Filled 2017-09-03: qty 10

## 2017-09-03 MED ORDER — PREDNISONE 20 MG PO TABS: 60.0000 mg | ORAL_TABLET | Freq: Every day | ORAL | 0 refills | Status: DC

## 2017-09-03 MED ORDER — ONDANSETRON HCL 4 MG/2ML IJ SOLN
4.0000 mg | Freq: Once | INTRAMUSCULAR | Status: AC
  Administered 2017-09-03: 4 mg via INTRAVENOUS
  Filled 2017-09-03: qty 2

## 2017-09-03 MED ORDER — CEPHALEXIN 500 MG PO CAPS: 500.0000 mg | ORAL_CAPSULE | Freq: Two times a day (BID) | ORAL | 0 refills | Status: DC

## 2017-09-03 MED ORDER — OMEPRAZOLE 40 MG PO CPDR: 40.0000 mg | DELAYED_RELEASE_CAPSULE | Freq: Every day | ORAL | 1 refills | Status: DC

## 2017-09-03 MED ORDER — GI COCKTAIL ~~LOC~~
30.0000 mL | Freq: Once | ORAL | Status: AC
  Administered 2017-09-03: 30 mL via ORAL
  Filled 2017-09-03: qty 30

## 2017-09-03 MED ORDER — VITAMIN B-1 100 MG PO TABS
100.0000 mg | ORAL_TABLET | Freq: Once | ORAL | Status: AC
  Administered 2017-09-03: 100 mg via ORAL
  Filled 2017-09-03: qty 1

## 2017-09-03 MED ORDER — SODIUM CHLORIDE 0.9 % IV BOLUS (SEPSIS)
1000.0000 mL | Freq: Once | INTRAVENOUS | Status: AC
  Administered 2017-09-03: 1000 mL via INTRAVENOUS

## 2017-09-03 MED ORDER — IOPAMIDOL (ISOVUE-300) INJECTION 61%
INTRAVENOUS | Status: AC
  Administered 2017-09-03: 100 mL via INTRAVENOUS
  Filled 2017-09-03: qty 100

## 2017-09-03 MED ORDER — IOPAMIDOL (ISOVUE-300) INJECTION 61%
100.0000 mL | Freq: Once | INTRAVENOUS | Status: AC | PRN
  Administered 2017-09-03: 100 mL via INTRAVENOUS

## 2017-09-03 MED ORDER — ALBUTEROL (5 MG/ML) CONTINUOUS INHALATION SOLN
15.0000 mg/h | INHALATION_SOLUTION | Freq: Once | RESPIRATORY_TRACT | Status: AC
  Administered 2017-09-03: 15 mg/h via RESPIRATORY_TRACT
  Filled 2017-09-03: qty 20

## 2017-09-03 MED ORDER — VITAMIN B-1 100 MG PO TABS: 100.0000 mg | ORAL_TABLET | Freq: Every day | ORAL | 1 refills | Status: DC

## 2017-09-03 MED ORDER — LORAZEPAM 2 MG/ML IJ SOLN
1.0000 mg | Freq: Once | INTRAMUSCULAR | Status: AC
  Administered 2017-09-03: 1 mg via INTRAVENOUS
  Filled 2017-09-03: qty 1

## 2017-09-03 MED ORDER — ADULT MULTIVITAMIN W/MINERALS CH
1.0000 | ORAL_TABLET | Freq: Once | ORAL | Status: AC
  Administered 2017-09-03: 1 via ORAL
  Filled 2017-09-03: qty 1

## 2017-09-03 MED ORDER — IPRATROPIUM BROMIDE 0.02 % IN SOLN
0.5000 mg | Freq: Once | RESPIRATORY_TRACT | Status: AC
  Administered 2017-09-03: 0.5 mg via RESPIRATORY_TRACT
  Filled 2017-09-03: qty 2.5

## 2017-09-03 MED ORDER — PANTOPRAZOLE SODIUM 40 MG IV SOLR
40.0000 mg | Freq: Once | INTRAVENOUS | Status: AC
  Administered 2017-09-03: 40 mg via INTRAVENOUS
  Filled 2017-09-03: qty 40

## 2017-09-03 MED ORDER — ALBUTEROL SULFATE HFA 108 (90 BASE) MCG/ACT IN AERS
2.0000 | INHALATION_SPRAY | Freq: Once | RESPIRATORY_TRACT | Status: AC
  Administered 2017-09-03: 2 via RESPIRATORY_TRACT
  Filled 2017-09-03: qty 6.7

## 2017-09-03 NOTE — Discharge Instructions (Signed)
Please take a multivitamin every day.  Please stop drinking.  We have given you outpatient resources to help you stop drinking.  Please take thiamine every day as well.   Please use your albuterol inhaler 2-4 puffs every 2-4 hours as needed for shortness of breath and wheezing.  Please stop smoking.   We are discharging you with a prescription of antibiotics for your urinary tract infection.  The CT scan of your abdomen was normal as was your chest x-ray.  Your labs show elevation of your liver function tests secondary to alcohol abuse.  Your drug screen was positive for cocaine.  I recommend you stop using cocaine.

## 2017-09-03 NOTE — ED Provider Notes (Signed)
TIME SEEN: 1:43 AM  CHIEF COMPLAINT: Multiple complaints  HPI: Patient is a 32 year old female with history of polysubstance abuse, hypertension, cirrhosis, previous pyelonephritis who presents to the emergency department with multiple complaints.  Patient was just admitted to the hospital for acute withdrawal, fever, hypoxia and was admitted to the ICU.  States that when she was discharged she immediately last drink was yesterday.  She is also a smoker.  Reports nonproductive cough and shortness of breath.  No hypoxia here.  Does not wear oxygen chronically.  She is wheezing.  Also complains of right upper abdominal pain that goes into her right flank.  She is also had lower abdominal pain, hematuria, dysuria and difficulty urinating.  Has had previous kidney infections.  No previous history of kidney stones.  Denies abdominal surgery.  Patient also reports that she feels like she is going through withdrawal.  She is shaking currently.  No visual hallucinations or seizures.  Also states that she has been vomiting blood and having blood in her stool.  No melena.  She has not vomited any blood in 24 hours.  No history of endoscopy.  ROS: See HPI Constitutional: no fever  Eyes: no drainage  ENT: no runny nose   Cardiovascular:  no chest pain  Resp: SOB  GI:  vomiting GU:  dysuria Integumentary: no rash  Allergy: no hives  Musculoskeletal: no leg swelling  Neurological: no slurred speech ROS otherwise negative  PAST MEDICAL HISTORY/PAST SURGICAL HISTORY:  Past Medical History:  Diagnosis Date  . Anxiety   . Cirrhosis of liver (HCC)   . Hypertension   . Polysubstance abuse (HCC)   . Renal disorder    Kidney Infection     MEDICATIONS:  Prior to Admission medications   Medication Sig Start Date End Date Taking? Authorizing Provider  cloNIDine (CATAPRES) 0.1 MG tablet Take 1 tablet (0.1 mg total) by mouth 2 (two) times daily. 05/01/17   Elpidio Anis, PA-C  diazepam (VALIUM) 5 MG tablet  Take 0.5-1 tablets (2.5-5 mg total) by mouth every 6 (six) hours as needed for anxiety. 08/05/17   Alison Murray, MD  folic acid (FOLVITE) 1 MG tablet Take 1 tablet (1 mg total) by mouth daily. 08/05/17   Alison Murray, MD  Multiple Vitamin (MULTIVITAMIN WITH MINERALS) TABS tablet Take 1 tablet by mouth daily. 08/05/17   Alison Murray, MD  omeprazole (PRILOSEC) 20 MG capsule Take 1 capsule (20 mg total) by mouth daily. Patient not taking: Reported on 09/23/2016 02/24/16   Jaynie Crumble, PA-C  oxyCODONE (OXY IR/ROXICODONE) 5 MG immediate release tablet Take 0.5-1 tablets (2.5-5 mg total) by mouth every 4 (four) hours as needed for severe pain. 08/05/17   Alison Murray, MD  pantoprazole (PROTONIX) 40 MG tablet Take 1 tablet (40 mg total) by mouth daily. 08/05/17   Alison Murray, MD  propranolol (INDERAL) 40 MG tablet Take 1 tablet (40 mg total) by mouth 2 (two) times daily. Patient taking differently: Take 20 mg by mouth 2 (two) times daily.  05/01/17   Elpidio Anis, PA-C  thiamine 100 MG tablet Take 1 tablet (100 mg total) by mouth daily. 08/05/17   Alison Murray, MD    ALLERGIES:  Allergies  Allergen Reactions  . Ibuprofen Anaphylaxis    Patient thinks it may have caused her throat to swell  . Peanut-Containing Drug Products Anaphylaxis  . Acetaminophen     Causes  patient to have an upset stomach  . Codeine  Hives and Itching  . Lisinopril Swelling  . Penicillins Hives and Swelling    Has patient had a PCN reaction causing immediate rash, facial/tongue/throat swelling, SOB or lightheadedness with hypotension: Yes Has patient had a PCN reaction causing severe rash involving mucus membranes or skin necrosis: unknown Has patient had a PCN reaction that required hospitalization No Has patient had a PCN reaction occurring within the last 10 years: No If all of the above answers are "NO", then may proceed with Cephalosporin use.   . Ativan [Lorazepam] Anxiety    Hallucinations per  patient    SOCIAL HISTORY:  Social History   Tobacco Use  . Smoking status: Current Every Day Smoker    Packs/day: 0.50    Types: Cigarettes  . Smokeless tobacco: Never Used  Substance Use Topics  . Alcohol use: Yes    Comment: 4 0r 5 fourty oz per day    FAMILY HISTORY: Family History  Problem Relation Age of Onset  . CAD Other   . Diabetes Other   . Hypertension Other   . Cancer Other   . Thyroid disease Other     EXAM: BP (!) 133/94 (BP Location: Left Arm)   Pulse 100   Temp 98.2 F (36.8 C) (Oral)   Resp 17   LMP 08/12/2017   SpO2 94%  CONSTITUTIONAL: Alert and oriented and responds appropriately to questions.  Chronically ill-appearing HEAD: Normocephalic EYES: Conjunctivae clear, pupils appear equal, EOMI ENT: normal nose; moist mucous membranes NECK: Supple, no meningismus, no nuchal rigidity, no LAD  CARD: Regular and tachycardic; S1 and S2 appreciated; no murmurs, no clicks, no rubs, no gallops RESP: Normal chest excursion without splinting or tachypnea; breath sounds equal bilaterally but she has significant expiratory wheezes and diminished at bases bilaterally, no hypoxia or respiratory distress, speaking full sentences ABD/GI: Normal bowel sounds; non-distended; soft, tender in the right upper quadrant no rebound, no guarding, no peritoneal signs RECTAL:  Normal rectal tone, no gross blood or melena, guaiac positive, no hemorrhoids appreciated, nontender rectal exam, no fecal impaction BACK:  The back appears normal and is non-tender to palpation, there is no CVA tenderness EXT: Normal ROM in all joints; non-tender to palpation; no edema; normal capillary refill; no cyanosis, no calf tenderness or swelling    SKIN: Normal color for age and race; warm; no rash NEURO: Moves all extremities equally PSYCH: The patient's mood and manner are appropriate. Grooming and personal hygiene are appropriate.  Patient is tremulous here.  No hallucinations.  No SI or  HI.  MEDICAL DECISION MAKING: Patient here with multiple complaints.  She does appear to be going through withdrawal.  Will give IV Ativan and IV fluids.  Labs show hemoconcentration suggestive of dehydration.  She is minimally guaiac positive here but no gross blood or melena and no signs of active hemorrhage.  No hematemesis she reports in over 24 hours.  We will monitor this closely.  She is wheezing and, though shortness of breath.  Will give breathing treatments and steroids.  Will obtain chest x-ray.  Also complaining of abdominal pain, suprapubic pain, flank pain.  Will obtain CT of her abdomen pelvis for further evaluation.  Suspect this could all just be from cirrhosis.  No signs of ascites to suggest SBP.  She does appear to have a urinary tract infection.  Will send cultures and give ceftriaxone here.  Patient may need admission to the hospital.  Had recent CTA of her chest that did not show  any sign of PE, pneumonia, edema.  ED PROGRESS: Patient's chest x-ray is clear.  Her CT scan shows signs consistent with cystitis but no kidney stone or pyelonephritis.  Otherwise unremarkable.  She is now tachycardic but this is after receiving 15 mg of albuterol.  Her lungs are clear and she has no hypoxia.  She is requesting admission for alcohol withdrawal.  Initially her CIWA was on when I walk into the room she is sound asleep after 1 of IV Ativan.  She only has mild tremors on exams and no hallucination at this time I do not feel she needs hospital criteria for inpatient hospitalization for alcohol withdrawal.  I also discussed this with the hospitalist, Dr. Julian Reil who agrees.     On reexamination patient has absolutely no tremulousness.  I suspect that there is no sign of true alcohol withdrawal currently.  She reports several days without alcohol.  Feel she is safe to be discharged.  I do not think that she needs to be on Librium because I am worried that she would go home and take all that medication  and continue to drink.  I will give her outpatient resources.  Will discharge with prescription of Keflex for her UTI, Protonix for her GERD.  She has no GI bleed currently.  I doubt that this is truly esophageal varices and may be more Mallory-Weiss tears but will give GI follow-up.  Will give her an albuterol inhaler.  Will discharge on prednisone burst and discharge with prescriptions of thiamine and multivitamins.  Patient comfortable with this plan.  At this time, I do not feel there is any life-threatening condition present. I have reviewed and discussed all results (EKG, imaging, lab, urine as appropriate) and exam findings with patient/family. I have reviewed nursing notes and appropriate previous records.  I feel the patient is safe to be discharged home without further emergent workup and can continue workup as an outpatient as needed. Discussed usual and customary return precautions. Patient/family verbalize understanding and are comfortable with this plan.  Outpatient follow-up has been provided if needed. All questions have been answered.      Cayle Cordoba, Layla Maw, DO 09/03/17 [phone]

## 2017-09-03 NOTE — ED Notes (Signed)
Pt used restroom before Clinical research associate could obtain a urine sample.

## 2017-09-04 LAB — URINE CULTURE

## 2017-09-19 ENCOUNTER — Encounter (HOSPITAL_COMMUNITY): Payer: Self-pay | Admitting: *Deleted

## 2017-09-19 ENCOUNTER — Other Ambulatory Visit: Payer: Self-pay

## 2017-09-19 ENCOUNTER — Emergency Department (HOSPITAL_COMMUNITY)
Admission: EM | Admit: 2017-09-19 | Discharge: 2017-09-19 | Discharge status: Against Medical Advice | Payer: Self-pay | Attending: Emergency Medicine | Admitting: Emergency Medicine

## 2017-09-19 ENCOUNTER — Emergency Department (HOSPITAL_COMMUNITY): Payer: Self-pay

## 2017-09-19 DIAGNOSIS — F10930 Alcohol use, unspecified with withdrawal, uncomplicated: Secondary | ICD-10-CM

## 2017-09-19 DIAGNOSIS — J069 Acute upper respiratory infection, unspecified: Secondary | ICD-10-CM | POA: Insufficient documentation

## 2017-09-19 DIAGNOSIS — Z91010 Allergy to peanuts: Secondary | ICD-10-CM | POA: Insufficient documentation

## 2017-09-19 DIAGNOSIS — I10 Essential (primary) hypertension: Secondary | ICD-10-CM | POA: Insufficient documentation

## 2017-09-19 DIAGNOSIS — F17210 Nicotine dependence, cigarettes, uncomplicated: Secondary | ICD-10-CM | POA: Insufficient documentation

## 2017-09-19 DIAGNOSIS — M545 Low back pain, unspecified: Secondary | ICD-10-CM

## 2017-09-19 DIAGNOSIS — B9789 Other viral agents as the cause of diseases classified elsewhere: Secondary | ICD-10-CM | POA: Insufficient documentation

## 2017-09-19 DIAGNOSIS — Z79899 Other long term (current) drug therapy: Secondary | ICD-10-CM | POA: Insufficient documentation

## 2017-09-19 DIAGNOSIS — F10230 Alcohol dependence with withdrawal, uncomplicated: Secondary | ICD-10-CM | POA: Insufficient documentation

## 2017-09-19 LAB — RAPID URINE DRUG SCREEN, HOSP PERFORMED
Amphetamines: NOT DETECTED
BARBITURATES: NOT DETECTED
Benzodiazepines: POSITIVE — AB
Cocaine: POSITIVE — AB
Opiates: NOT DETECTED
TETRAHYDROCANNABINOL: NOT DETECTED

## 2017-09-19 LAB — URINALYSIS, ROUTINE W REFLEX MICROSCOPIC
BILIRUBIN URINE: NEGATIVE
Glucose, UA: NEGATIVE mg/dL
Ketones, ur: NEGATIVE mg/dL
LEUKOCYTES UA: NEGATIVE
NITRITE: NEGATIVE
PH: 5 (ref 5.0–8.0)
Protein, ur: NEGATIVE mg/dL
SPECIFIC GRAVITY, URINE: 1.021 (ref 1.005–1.030)

## 2017-09-19 LAB — PREGNANCY, URINE: Preg Test, Ur: NEGATIVE

## 2017-09-19 LAB — CBC WITH DIFFERENTIAL/PLATELET
BASOS ABS: 0.1 10*3/uL (ref 0.0–0.1)
Basophils Relative: 1 %
Eosinophils Absolute: 0.1 10*3/uL (ref 0.0–0.7)
Eosinophils Relative: 1 %
HEMATOCRIT: 32.2 % — AB (ref 36.0–46.0)
Hemoglobin: 11.7 g/dL — ABNORMAL LOW (ref 12.0–15.0)
LYMPHS ABS: 1.9 10*3/uL (ref 0.7–4.0)
Lymphocytes Relative: 34 %
MCH: 37.1 pg — ABNORMAL HIGH (ref 26.0–34.0)
MCHC: 36.3 g/dL — ABNORMAL HIGH (ref 30.0–36.0)
MCV: 102.2 fL — ABNORMAL HIGH (ref 78.0–100.0)
MONOS PCT: 14 %
Monocytes Absolute: 0.8 10*3/uL (ref 0.1–1.0)
Neutro Abs: 2.8 10*3/uL (ref 1.7–7.7)
Neutrophils Relative %: 50 %
PLATELETS: 192 10*3/uL (ref 150–400)
RBC: 3.15 MIL/uL — AB (ref 3.87–5.11)
RDW: 15.8 % — AB (ref 11.5–15.5)
WBC: 5.7 10*3/uL (ref 4.0–10.5)

## 2017-09-19 MED ORDER — ALBUTEROL SULFATE HFA 108 (90 BASE) MCG/ACT IN AERS: 1.0000 | INHALATION_SPRAY | Freq: Four times a day (QID) | RESPIRATORY_TRACT | 0 refills | Status: DC | PRN

## 2017-09-19 MED ORDER — METHOCARBAMOL 500 MG PO TABS
500.0000 mg | ORAL_TABLET | Freq: Two times a day (BID) | ORAL | 0 refills | Status: DC
Start: 2017-09-19 — End: 2022-05-02

## 2017-09-19 MED ORDER — LORAZEPAM 2 MG/ML IJ SOLN
1.0000 mg | Freq: Once | INTRAMUSCULAR | Status: AC
  Administered 2017-09-19: 1 mg via INTRAVENOUS
  Filled 2017-09-19: qty 1

## 2017-09-19 MED ORDER — BENZONATATE 100 MG PO CAPS: 100.0000 mg | ORAL_CAPSULE | Freq: Three times a day (TID) | ORAL | 0 refills | Status: DC

## 2017-09-19 MED ORDER — SODIUM CHLORIDE 0.9 % IV BOLUS (SEPSIS)
1000.0000 mL | Freq: Once | INTRAVENOUS | Status: AC
  Administered 2017-09-19: 1000 mL via INTRAVENOUS

## 2017-09-19 MED ORDER — BENZONATATE 100 MG PO CAPS
100.0000 mg | ORAL_CAPSULE | Freq: Once | ORAL | Status: AC
  Administered 2017-09-19: 100 mg via ORAL
  Filled 2017-09-19: qty 1

## 2017-09-19 MED ORDER — IPRATROPIUM-ALBUTEROL 0.5-2.5 (3) MG/3ML IN SOLN
3.0000 mL | Freq: Once | RESPIRATORY_TRACT | Status: AC
  Administered 2017-09-19: 3 mL via RESPIRATORY_TRACT
  Filled 2017-09-19: qty 3

## 2017-09-19 MED ORDER — PREDNISONE 20 MG PO TABS
60.0000 mg | ORAL_TABLET | Freq: Once | ORAL | Status: AC
Start: 2017-09-19 — End: 2017-09-19
  Administered 2017-09-19: 60 mg via ORAL
  Filled 2017-09-19: qty 3

## 2017-09-19 NOTE — ED Provider Notes (Signed)
[facility] EMERGENCY DEPARTMENT Provider Note   CSN: [id number] Arrival date & time: 09/19/17  1112     History   Chief Complaint Chief Complaint  Patient presents with  . Alcohol Problem  . Back Pain  . Cough    HPI [name] is a 32 y.o. female.  HPI 33 year old Caucasian female past medical history significant for polysubstance abuse, hypertension, cirrhosis, previous pyelonephritis, ureterolithiasis, that presents to the emergency department today with multiple complaints.  Patient complains of a nonproductive cough and wheezing for the past 2-3 days.  Patient reports associated rhinorrhea, nasal congestion, sore throat.  Patient is a daily tobacco user.  She does not wear oxygen at baseline.  She reports sick contacts with URI symptoms.  Patient reports getting the influenza and pneumonia vaccine this year.  Patient does not take anything for her symptoms prior to arrival.  Nothing makes better or worse.  She reports associated chills but denies any fevers at home.  Patient denies any history of DVT/PE, prolonged immobilization, lower extremity edema or calf tenderness, hospitalization less than 4 weeks ago, hemoptysis, and hormone use.  She reports some chest discomfort with cough but denies any chest pain without.  Patient denies any cardiac history.  Denies any history of IV drug use.  Second complaint patient states that she is having bilateral lower back pain near her kidneys.  Patient has history of kidney stones and pyelonephritis is concerned that she may be having these processes.  Patient denies any associated urinary symptoms.  She does report currently having vaginal bleeding from her menstrual cycle.  She denies any associated abdominal pain, vaginal discharge, pelvic pain.  She states that the bilateral lower back pain is sharp and intermittent. Patient has not taking for the pain.  Nothing makes better or worse.  She reports nausea but denies  any emesis.  Patient states the pain was worse after reaching for something yesterday.  Patient describes the pain as sharp and spasms. Pt denies any ha, night sweats, hx of ivdu/cancer, loss or bowel or bladder, urinary retention, saddle paresthesias, lower extremity paresthesias.  Denies any recent trauma.  Patient also complains of wanting alcohol detox.  States her last drink was yesterday.  Patient states that she has tremors.  Denies any associated hallucinations.  She states that she is going through withdrawals and that she is really wanting help for this.  Mother at bedside states that patient is now living with her in [place] and wants to get her help.  Patient states that her last drink was yesterday.  She states that she drinks approximately 8-940 ounce beers a day.  Patient reports Xanax helped in the past with her detox.  She states that Ativan will help however this sometimes causes her hallucinations.  I questioned patient about further alcohol use and she swears that she will not take any further alcohol use if she can get some help.  Patient is shaking currently.  Denies any visual hallucinations or seizures.  Denies any associated hematemesis or bloody stools.  Patient was seen on 1/11 for same symptoms asking for alcohol withdrawal at that time.  Patient was discharged with no signs of alcohol withdrawal currently.  She was given outpatient resources.  She was given a prescription for Keflex for UTI.  Pt denies any fever, chill, ha, vision changes, lightheadedness, dizziness, congestion, neck pain, cp, sob,  abd pain, urinary symptoms, change in bowel habits, melena, hematochezia, lower extremity paresthesias.  Past Medical History:  Diagnosis Date  . Anxiety   . Cirrhosis of liver (HCC)   . Hypertension   . Polysubstance abuse (HCC)   . Renal disorder    Kidney Infection     Patient Active Problem List   Diagnosis Date Noted  . Acute respiratory failure with  hypoxia (HCC) 08/01/2017  . Polysubstance abuse (HCC) 08/01/2017  . Constipation 08/01/2017  . Anemia 08/01/2017  . Rectal bleeding 08/01/2017  . Alcohol withdrawal (HCC) 07/31/2017  . Ventral hernia 10/04/2016  . Incarcerated ventral hernia 10/03/2016  . Anxiety 02/15/2014  . Hypertension 02/15/2014    Past Surgical History:  Procedure Laterality Date  . ARTERY REPAIR    . CESAREAN SECTION     x 5  . MOUTH SURGERY    . TEAR DUCT PROBING    . TUBAL LIGATION    . VENTRAL HERNIA REPAIR N/A 10/03/2016   Procedure: OPEN INCARCERATED HERNIA REPAIR VENTRAL ADULT;  Surgeon: Axel Filler, MD;  Location: [facility] OR;  Service: General;  Laterality: N/A;    OB History    No data available       Home Medications    Prior to Admission medications   Medication Sig Start Date End Date Taking? Authorizing Provider  cephALEXin (KEFLEX) 500 MG capsule Take 1 capsule (500 mg total) by mouth 2 (two) times daily. 09/03/17   Ward, Layla Maw, DO  cloNIDine (CATAPRES) 0.1 MG tablet Take 1 tablet (0.1 mg total) by mouth 2 (two) times daily. 05/01/17   Elpidio Anis, PA-C  diazepam (VALIUM) 5 MG tablet Take 0.5-1 tablets (2.5-5 mg total) by mouth every 6 (six) hours as needed for anxiety. 08/05/17   Alison Murray, MD  folic acid (FOLVITE) 1 MG tablet Take 1 tablet (1 mg total) by mouth daily. 08/05/17   Alison Murray, MD  Multiple Vitamin (MULTIVITAMIN WITH MINERALS) TABS tablet Take 1 tablet by mouth daily. 08/05/17   Alison Murray, MD  omeprazole (PRILOSEC) 40 MG capsule Take 1 capsule (40 mg total) by mouth daily. 09/03/17   Ward, Layla Maw, DO  ondansetron (ZOFRAN ODT) 4 MG disintegrating tablet Take 1 tablet (4 mg total) by mouth every 8 (eight) hours as needed for nausea or vomiting. 09/03/17   Ward, Layla Maw, DO  pantoprazole (PROTONIX) 40 MG tablet Take 1 tablet (40 mg total) by mouth daily. 08/05/17   Alison Murray, MD  predniSONE (DELTASONE) 20 MG tablet Take 3 tablets (60 mg total) by mouth  daily. 09/03/17   Ward, Layla Maw, DO  propranolol (INDERAL) 40 MG tablet Take 1 tablet (40 mg total) by mouth 2 (two) times daily. Patient taking differently: Take 20 mg by mouth 2 (two) times daily.  05/01/17   Elpidio Anis, PA-C  thiamine (VITAMIN B-1) 100 MG tablet Take 1 tablet (100 mg total) by mouth daily. 09/03/17   Ward, Layla Maw, DO  thiamine 100 MG tablet Take 1 tablet (100 mg total) by mouth daily. 08/05/17   Alison Murray, MD  labetalol (NORMODYNE) 100 MG tablet Take 1 tablet (100 mg total) by mouth 2 (two) times daily. 02/23/13 02/24/13  Gilda Crease, MD    Family History Family History  Problem Relation Age of Onset  . CAD Other   . Diabetes Other   . Hypertension Other   . Cancer Other   . Thyroid disease Other     Social History Social History   Tobacco Use  . Smoking status: Current Every  Day Smoker    Packs/day: 0.50    Types: Cigarettes  . Smokeless tobacco: Never Used  Substance Use Topics  . Alcohol use: Yes    Comment: 4 0r 5 fourty oz per day  . Drug use: Yes    Frequency: 2.0 times per week    Types: Cocaine    Comment: 02/23/17 last used 2 weeks ago     Allergies   Ibuprofen; Peanut-containing drug products; Acetaminophen; Codeine; Lisinopril; Penicillins; and Ativan [lorazepam]   Review of Systems Review of Systems  Constitutional: Negative for chills and fever.  HENT: Positive for congestion, postnasal drip, rhinorrhea, sinus pressure, sinus pain and sore throat.   Eyes: Negative for visual disturbance.  Respiratory: Positive for cough and wheezing. Negative for shortness of breath.   Cardiovascular: Negative for chest pain.  Gastrointestinal: Positive for nausea. Negative for abdominal pain, diarrhea and vomiting.  Genitourinary: Positive for flank pain. Negative for dysuria, frequency, hematuria, urgency and vaginal discharge.  Musculoskeletal: Positive for back pain and myalgias. Negative for arthralgias.  Skin: Negative for rash.    Neurological: Negative for dizziness, syncope, weakness, light-headedness, numbness and headaches.  Psychiatric/Behavioral: Negative for confusion, hallucinations, sleep disturbance and suicidal ideas. The patient is not nervous/anxious.      Physical Exam Updated Vital Signs BP 123/87 (BP Location: Right Arm)   Pulse 80   Temp 98.6 F (37 C) (Oral)   Resp 16   LMP 09/19/2017   SpO2 100%   Physical Exam  Constitutional: She is oriented to person, place, and time. She appears well-developed and well-nourished.  Non-toxic appearance. No distress.  HENT:  Head: Normocephalic and atraumatic.  Nose: Nose normal.  Mouth/Throat: Oropharynx is clear and moist.  Eyes: Conjunctivae are normal. Pupils are equal, round, and reactive to light. Right eye exhibits no discharge. Left eye exhibits no discharge.  Neck: Normal range of motion. Neck supple. No JVD present. No tracheal deviation present.  Cardiovascular: Normal rate, regular rhythm, normal heart sounds and intact distal pulses. Exam reveals no gallop and no friction rub.  No murmur heard. Pulmonary/Chest: Effort normal. No stridor. No respiratory distress. She has wheezes (expiratory wheezes). She has no rales. She exhibits no tenderness.  No hypoxia or tachypnea.  Abdominal: Soft. Bowel sounds are normal. She exhibits no distension. There is no tenderness. There is no rigidity, no rebound, no guarding, no CVA tenderness, no tenderness at McBurney's point and negative Murphy's sign.  Musculoskeletal: Normal range of motion. She exhibits no tenderness.  Moves all extremities appropriately.  All joints are with normal range of motion without any erythema or warmth.  No midline L spine tenderness. No deformities or step offs noted. Full ROM. Pelvis is stable.  Patient has some midline L-spine tenderness no deformity step-offs noted.   Lymphadenopathy:    She has no cervical adenopathy.  Neurological: She is alert and oriented to  person, place, and time.  The patient is alert, attentive, and oriented x 3. Speech is clear. Cranial nerve II-VII grossly intact. Negative pronator drift. Sensation intact. Strength 5/5 in all extremities. Reflexes 2+ and symmetric at biceps, triceps, knees, and ankles. Rapid alternating movement and fine finger movements intact.    Skin: Skin is warm and dry. Capillary refill takes less than 2 seconds. She is not diaphoretic.  The patient's mood and manner are appropriate. Patient is tremulous here.  No hallucinations.  No SI or HI.  Psychiatric: Her behavior is normal. Judgment and thought content normal.  Nursing note and  vitals reviewed.    ED Treatments / Results  Labs (all labs ordered are listed, but only abnormal results are displayed) Labs Reviewed  CBC WITH DIFFERENTIAL/PLATELET - Abnormal; Notable for the following components:      Result Value   RBC 3.15 (*)    Hemoglobin 11.7 (*)    HCT 32.2 (*)    MCV 102.2 (*)    MCH 37.1 (*)    MCHC 36.3 (*)    RDW 15.8 (*)    All other components within normal limits  URINALYSIS, ROUTINE W REFLEX MICROSCOPIC - Abnormal; Notable for the following components:   Color, Urine AMBER (*)    APPearance CLOUDY (*)    Hgb urine dipstick LARGE (*)    Bacteria, UA FEW (*)    Squamous Epithelial / LPF TOO NUMEROUS TO COUNT (*)    All other components within normal limits  RAPID URINE DRUG SCREEN, HOSP PERFORMED - Abnormal; Notable for the following components:   Cocaine POSITIVE (*)    Benzodiazepines POSITIVE (*)    All other components within normal limits  URINE CULTURE  PREGNANCY, URINE    EKG  EKG Interpretation None       Radiology Dg Chest 2 View  Result Date: 09/19/2017 CLINICAL DATA:  Productive cough EXAM: CHEST  2 VIEW COMPARISON:  Wall in 2019 FINDINGS: The lungs are clear without focal pneumonia, edema, pneumothorax or pleural effusion. The cardiopericardial silhouette is within normal limits for size. The  visualized bony structures of the thorax are intact. IMPRESSION: No active cardiopulmonary disease. Electronically Signed   By: Kennith Center M.D.   On: 09/19/2017 14:00    Procedures Procedures (including critical care time)  Medications Ordered in ED Medications  ipratropium-albuterol (DUONEB) 0.5-2.5 (3) MG/3ML nebulizer solution 3 mL (3 mLs Nebulization Given 09/19/17 1422)  sodium chloride 0.9 % bolus 1,000 mL (1,000 mLs Intravenous New Bag/Given 09/19/17 1422)  LORazepam (ATIVAN) injection 1 mg (1 mg Intravenous Given 09/19/17 1422)  predniSONE (DELTASONE) tablet 60 mg (60 mg Oral Given 09/19/17 1421)  benzonatate (TESSALON) capsule 100 mg (100 mg Oral Given 09/19/17 1422)     Initial Impression / Assessment and Plan / ED Course  I have reviewed the triage vital signs and the nursing notes.  Pertinent labs & imaging results that were available during my care of the patient were reviewed by me and considered in my medical decision making (see chart for details).     Patient presents to the ED with multiple complaints.  Patient has been seen several times in the ED for same complaints.  She was recently admitted on 12/8 for alcohol withdrawal.  Patient seen on 1/11 for same symptoms today.  Patient is requesting help with withdrawal from alcohol however she states that she is not willing to stop drinking.  Patient also complains of productive cough, URI symptoms, wheezing with tobacco use.  She also reports some bilateral low back pain that she is concerned that is something wrong with her kidneys.  She denies any associated urinary symptoms, vomiting, vaginal bleeding, vaginal discharge, change in bowel habits, fevers or chills, IV drug use.  On exam patient is very much overwhelmed well-appearing and nontoxic.  Her vital signs are reassuring.  She is afebrile, no tachycardia, no hypotension is noted.  She is not hypoxic.  She does have some mild tremors on exam however she has no signs of  hallucinations or seizure-like activity.  Patient is along with mild expiratory wheezes noted without  any focal rhonchi.  Abdominal exam is without any focal tenderness.  No signs of icterus or jaundice.  Patient has no CVA tenderness.  No nuchal rigidity.  Patient recently had complete workup on 12/8 including CTA of chest to rule out PE which was normal.  Patient was treated for UTI on 1/11 with a urine culture that grew out multiple species and suggested recollection.  Patient is lab works have been reassuring.  Patient has no leukocytosis.  Hemoglobin appears at patient's baseline.  UA shows contamination.  Patient denies any associated urinary symptoms.  I doubt pyelonephritis or UTI given recent urine culture that was negative for recent antibiotic use.  I will not treat at this time.  Urine drug screen is positive for cocaine and benzos.   Lab called me and told me that patient's CMP was YP make with history of same.  She has required ultrasensitive irrigation in the past.  Lab has discussed that we could redraw the specimen.  I discussed with patient and patient does not want to stay for this.  I instructed her that I need to find out her kidney function is okay of her left lites are within normal limits.  Also would like to see if her AST and ALT are trending appropriately.  Patient states that she has to leave the makes the bus.  Discussed with patient that I am requesting that she stay and have the blood work and she is refusing.  Patient did have lab work on 1/11 that showed baseline liver functions.  Her kidney function was normal.  Elect lites were reassuring.  However I would like these labs redrawn today however feel that they would likely be close to patient's baseline.  Patient has no signs of acute withdrawal at this time.  No tremors noted.  No signs of hallucinations.  Patient remains hemodynamically stable.  Clinical presentation does not seem consistent with pyelo-, ACS, PE,  dissection, pneumonia.  Will treat for viral bronchitis.  Patient is also told that it is important to follow-up with her primary care doctor to have this checked.  I was going to start patient on lithium taper however patient states that she refuses to stop drinking at this time and she is not ready.  Discussed outpatient follow-up.  Patient encouraged to continue taking her multivitamins and thiamine.  Pt presents to the ED for audible complaints including alcohol withdrawal, URI symptoms, low back pain. They have decided to leave AMA. I have discussed my concerns as a provider and the possibility that this may worsen. We discussed the nature, risks and benefits, and alternatives to treatment. I have specifically discussed that without further evaluation I cannot guarantee there is not a life threatening event occuring.  Time was given to allow the opportunity to ask questions and consider the options, and after the discussion, the patient decided to refuse the offered treatment. Pt is A&Ox4, his own POA and states understanding of my concerns and the possible consequences. After refusal, I made every reasonable opportunity to treat them to the best of my ability. I have made the patient aware that this is an AMA discharge, but they may return at any time for further evaluation and treatment.  Pt seen and eval by my attending who is agreeable with the above plan.     Final Clinical Impressions(s) / ED Diagnoses   Final diagnoses:  Acute bilateral low back pain without sciatica  Viral URI with cough  Alcohol withdrawal syndrome without  complication [facility])    ED Discharge Orders        Ordered    albuterol (PROVENTIL HFA;VENTOLIN HFA) 108 (90 Base) MCG/ACT inhaler  Every 6 hours PRN     09/19/17 1635    benzonatate (TESSALON) 100 MG capsule  Every 8 hours     09/19/17 1635    methocarbamol (ROBAXIN) 500 MG tablet  2 times daily     09/19/17 1635       Rise Mu, PA-C 09/19/17  1639    Gerhard Munch, MD 09/19/17 1651

## 2017-09-19 NOTE — ED Triage Notes (Signed)
Pt has multiple complaints. Reports detoxing from alcohol, last drank last night. Also has recent productive cough, nasal congestion. Having nausea and diarrhea. And also reports bilateral lower back pain that started when reaching for something.

## 2017-09-19 NOTE — ED Notes (Signed)
Asking for food

## 2017-09-19 NOTE — Discharge Instructions (Signed)
Your imaging and lab work that we have obtained was been reassuring.  Your urine shows no signs of infection at this time.  If you develop urinary symptoms may need antibiotics.  I would like for you to stay to have the rest of your blood work redrawn however understand that she have to catch the bus.  Please make sure that you follow-up with [facility] this week to have your blood check or return to the ED. have given you outpatient follow-up for your alcohol withdrawal.  I do think he had a viral bronchitis use the albuterol inhaler and Tessalon for cough.  Take decongestants over-the-counter.  Avoid Tylenol due to your liver enzymes.  In terms of your low back pain is likely musculoskeletal use the Robaxin which is a muscle relaxer.  You may also apply heat and cold compresses.  May take NSAIDs if needed.

## 2017-09-19 NOTE — ED Notes (Signed)
Pt in restroom 

## 2017-09-19 NOTE — ED Notes (Signed)
Upon rounding, this RN updated pt and family to delay. Pt and family asking to speak with EDP. This RN ensured EDP would be notified and will be in as soon as possible. Pt mother visibly irritated and says "I need to be out of here in 30 mins. If not, YOU can get [place] home!"

## 2017-09-20 LAB — URINE CULTURE

## 2017-09-21 MED FILL — !VENTOLIN HFA INHALER: 108 (90 BAS | 25 days supply | Qty: 18 | Fill #0

## 2017-09-21 MED FILL — BENZONATATE 100 MG CAPSULE: 100 | 7 days supply | Qty: 21 | Fill #0

## 2017-09-21 MED FILL — ?PANTOPRAZOLE SOD DR 40MG: 40 MG | 30 days supply | Qty: 30 | Fill #0

## 2017-09-21 MED FILL — METHOCARBAMOL 500 MG TABLET: 500 | 5 days supply | Qty: 10 | Fill #0

## 2018-06-01 ENCOUNTER — Emergency Department (HOSPITAL_COMMUNITY)
Admission: EM | Admit: 2018-06-01 | Discharge: 2018-06-01 | Disposition: A | Discharge status: Home / Self Care | Payer: Self-pay | Attending: Emergency Medicine | Admitting: Emergency Medicine

## 2018-06-01 ENCOUNTER — Emergency Department (HOSPITAL_COMMUNITY): Payer: Self-pay

## 2018-06-01 ENCOUNTER — Encounter (HOSPITAL_COMMUNITY): Payer: Self-pay | Admitting: *Deleted

## 2018-06-01 DIAGNOSIS — M25561 Pain in right knee: Secondary | ICD-10-CM | POA: Insufficient documentation

## 2018-06-01 DIAGNOSIS — Z91010 Allergy to peanuts: Secondary | ICD-10-CM | POA: Insufficient documentation

## 2018-06-01 DIAGNOSIS — Z79899 Other long term (current) drug therapy: Secondary | ICD-10-CM | POA: Insufficient documentation

## 2018-06-01 DIAGNOSIS — F17210 Nicotine dependence, cigarettes, uncomplicated: Secondary | ICD-10-CM | POA: Insufficient documentation

## 2018-06-01 DIAGNOSIS — I10 Essential (primary) hypertension: Secondary | ICD-10-CM | POA: Insufficient documentation

## 2018-06-01 LAB — COMPREHENSIVE METABOLIC PANEL
ALT: 21 U/L (ref 0–44)
AST: 48 U/L — ABNORMAL HIGH (ref 15–41)
Albumin: 3.5 g/dL (ref 3.5–5.0)
Alkaline Phosphatase: 99 U/L (ref 38–126)
Anion gap: 10 (ref 5–15)
BUN: 9 mg/dL (ref 6–20)
CO2: 26 mmol/L (ref 22–32)
CREATININE: 0.57 mg/dL (ref 0.44–1.00)
Calcium: 9 mg/dL (ref 8.9–10.3)
Chloride: 103 mmol/L (ref 98–111)
Glucose, Bld: 91 mg/dL (ref 70–99)
Potassium: 3.3 mmol/L — ABNORMAL LOW (ref 3.5–5.1)
SODIUM: 139 mmol/L (ref 135–145)
TOTAL PROTEIN: 6.7 g/dL (ref 6.5–8.1)
Total Bilirubin: 0.5 mg/dL (ref 0.3–1.2)

## 2018-06-01 LAB — ETHANOL: ALCOHOL ETHYL (B): 179 mg/dL — AB (ref <10)

## 2018-06-01 LAB — CBC WITH DIFFERENTIAL/PLATELET
ABS IMMATURE GRANULOCYTES: 0.01 10*3/uL (ref 0.00–0.07)
BASOS ABS: 0 10*3/uL (ref 0.0–0.1)
Basophils Relative: 0 %
EOS PCT: 3 %
Eosinophils Absolute: 0.2 10*3/uL (ref 0.0–0.5)
HCT: 36.4 % (ref 36.0–46.0)
HEMOGLOBIN: 11 g/dL — AB (ref 12.0–15.0)
Immature Granulocytes: 0 %
LYMPHS ABS: 2.1 10*3/uL (ref 0.7–4.0)
Lymphocytes Relative: 31 %
MCH: 30 pg (ref 26.0–34.0)
MCHC: 30.2 g/dL (ref 30.0–36.0)
MCV: 99.2 fL (ref 80.0–100.0)
MONOS PCT: 10 %
Monocytes Absolute: 0.7 10*3/uL (ref 0.1–1.0)
NEUTROS ABS: 3.8 10*3/uL (ref 1.7–7.7)
NEUTROS PCT: 56 %
NRBC: 0 % (ref 0.0–0.2)
PLATELETS: 257 10*3/uL (ref 150–400)
RBC: 3.67 MIL/uL — AB (ref 3.87–5.11)
RDW: 14.1 % (ref 11.5–15.5)
WBC: 6.8 10*3/uL (ref 4.0–10.5)

## 2018-06-01 MED ORDER — OXYCODONE HCL 5 MG PO TABS
5.0000 mg | ORAL_TABLET | Freq: Once | ORAL | Status: AC
  Administered 2018-06-01: 5 mg via ORAL
  Filled 2018-06-01: qty 1
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
## 2018-06-01 NOTE — ED Notes (Signed)
Dr. Campos at bedside   

## 2018-06-01 NOTE — Discharge Instructions (Signed)
You may have injured a ligament in your knee. Please wear the knee brace and use crutches whenever you are walking. You can take ibuprofen and tylenol with food to decrease the risk of stomach pain or upset from these medications. You will need to see an orthopedic surgeon to follow up your knee injury.   If you experience worsening pain, swelling, weakness, chest pain, shortness of breath please see a doctor immediately.

## 2018-06-01 NOTE — ED Provider Notes (Signed)
[facility] EMERGENCY DEPARTMENT Provider Note   CSN: [id number] Arrival date & time: 06/01/18  4098     History   Chief Complaint Chief Complaint  Patient presents with  . Knee Pain  . Hand Pain    HPI [name] is a 32 y.o. female with anxiety, polysubstance abuse presenting with 4 days of worsening right knee pain/swelling associated with generalized body swelling. She reports inciting trauma Saturday morning when stepping out of bed, her leg got twisted when she stood and she felt and heard a "pop." She is weight bearing. Minimal relief with ibuprofen, acetaminophen, and ice. Denies previous history of joint swelling. She also notes generalized body swelling notable in her lower and upper extremities. She is unable to make a fist due to swelling/pain.   Denies fevers, chills, diarrhea, dysuria, vaginal bleeding/discharge. She is not sexually active. Denies skin rash, tick bite, recent travels or illness or sick contacts.   She drinks two 40oz beers a day (last drink was 10 hours ago) and uses inhaled cocaine occasional (last use was a week ago), and smokes 10 cigarettes a day.   Family history is positive for rheumatoid arthritis.   HPI  Past Medical History:  Diagnosis Date  . Anxiety   . Cirrhosis of liver (HCC)   . Hypertension   . Polysubstance abuse (HCC)   . Renal disorder    Kidney Infection     Patient Active Problem List   Diagnosis Date Noted  . Acute respiratory failure with hypoxia (HCC) 08/01/2017  . Polysubstance abuse (HCC) 08/01/2017  . Constipation 08/01/2017  . Anemia 08/01/2017  . Rectal bleeding 08/01/2017  . Alcohol withdrawal (HCC) 07/31/2017  . Ventral hernia 10/04/2016  . Incarcerated ventral hernia 10/03/2016  . Anxiety 02/15/2014  . Hypertension 02/15/2014    Past Surgical History:  Procedure Laterality Date  . ARTERY REPAIR    . CESAREAN SECTION     x 5  . MOUTH SURGERY    . TEAR DUCT PROBING    .  TUBAL LIGATION    . VENTRAL HERNIA REPAIR N/A 10/03/2016   Procedure: OPEN INCARCERATED HERNIA REPAIR VENTRAL ADULT;  Surgeon: Axel Filler, MD;  Location: [facility] OR;  Service: General;  Laterality: N/A;     OB History   None      Home Medications    Prior to Admission medications   Medication Sig Start Date End Date Taking? Authorizing Provider  albuterol (PROVENTIL HFA;VENTOLIN HFA) 108 (90 Base) MCG/ACT inhaler Inhale 1-2 puffs into the lungs every 6 (six) hours as needed for wheezing or shortness of breath. 09/19/17   Leaphart, Lynann Beaver, PA-C  benzonatate (TESSALON) 100 MG capsule Take 1 capsule (100 mg total) by mouth every 8 (eight) hours. 09/19/17   Rise Mu, PA-C  cephALEXin (KEFLEX) 500 MG capsule Take 1 capsule (500 mg total) by mouth 2 (two) times daily. 09/03/17   Ward, Layla Maw, DO  cloNIDine (CATAPRES) 0.1 MG tablet Take 1 tablet (0.1 mg total) by mouth 2 (two) times daily. 05/01/17   Elpidio Anis, PA-C  diazepam (VALIUM) 5 MG tablet Take 0.5-1 tablets (2.5-5 mg total) by mouth every 6 (six) hours as needed for anxiety. Patient not taking: Reported on 09/19/2017 08/05/17   Alison Murray, MD  folic acid (FOLVITE) 1 MG tablet Take 1 tablet (1 mg total) by mouth daily. Patient not taking: Reported on 09/19/2017 08/05/17   Alison Murray, MD  methocarbamol (ROBAXIN) 500 MG tablet  Take 1 tablet (500 mg total) by mouth 2 (two) times daily. 09/19/17   Rise Mu, PA-C  Multiple Vitamin (MULTIVITAMIN WITH MINERALS) TABS tablet Take 1 tablet by mouth daily. Patient not taking: Reported on 09/19/2017 08/05/17   Alison Murray, MD  neomycin-bacitracin-polymyxin (NEOSPORIN) ointment Apply topically as needed for wound care.    [provider]  omeprazole (PRILOSEC) 40 MG capsule Take 1 capsule (40 mg total) by mouth daily. Patient not taking: Reported on 09/19/2017 09/03/17   Ward, Layla Maw, DO  ondansetron (ZOFRAN ODT) 4 MG disintegrating tablet Take 1 tablet (4  mg total) by mouth every 8 (eight) hours as needed for nausea or vomiting. 09/03/17   Ward, Layla Maw, DO  pantoprazole (PROTONIX) 40 MG tablet Take 1 tablet (40 mg total) by mouth daily. 08/05/17   Alison Murray, MD  predniSONE (DELTASONE) 20 MG tablet Take 3 tablets (60 mg total) by mouth daily. Patient not taking: Reported on 09/19/2017 09/03/17   Ward, Layla Maw, DO  propranolol (INDERAL) 40 MG tablet Take 1 tablet (40 mg total) by mouth 2 (two) times daily. Patient taking differently: Take 20 mg by mouth 2 (two) times daily.  05/01/17   Elpidio Anis, PA-C  thiamine (VITAMIN B-1) 100 MG tablet Take 1 tablet (100 mg total) by mouth daily. Patient not taking: Reported on 09/19/2017 09/03/17   Ward, Layla Maw, DO  thiamine 100 MG tablet Take 1 tablet (100 mg total) by mouth daily. Patient not taking: Reported on 09/19/2017 08/05/17   Alison Murray, MD    Family History Family History  Problem Relation Age of Onset  . CAD Other   . Diabetes Other   . Hypertension Other   . Cancer Other   . Thyroid disease Other     Social History Social History   Tobacco Use  . Smoking status: Current Every Day Smoker    Packs/day: 0.50    Types: Cigarettes  . Smokeless tobacco: Never Used  Substance Use Topics  . Alcohol use: Yes    Comment: 4 0r 5 fourty oz per day  . Drug use: Yes    Frequency: 2.0 times per week    Types: Cocaine    Comment: 02/23/17 last used 2 weeks ago     Allergies   Ibuprofen; Peanut-containing drug products; Acetaminophen; Codeine; Lisinopril; Penicillins; and Ativan [lorazepam]   Review of Systems Review of Systems See HPI  Physical Exam Updated Vital Signs BP (!) 144/104 (BP Location: Right Arm)   Pulse (!) 112   Temp 98.4 F (36.9 C) (Oral)   Resp 18   SpO2 96%   Physical Exam Vitals:   06/01/18 0713  BP: (!) 144/104  Pulse: (!) 112  Resp: 18  Temp: 98.4 F (36.9 C)  TempSrc: Oral  SpO2: 96%   General: Vital signs reviewed.  Patient is  anxious appearing but well-developed and well-nourished, in no acute distress and cooperative with exam.  Cardiovascular: tachycardic, regular rhythm  Pulmonary/Chest: Diffuse expiratory wheezes Musculoskeletal: right knee swollen without redness or warmth. Limited ROM due to pain and significantly tender to palpation diffusely.  Extremities: 1+ bilateral lower extremity pitting edema, DP pulses strong and symmetric.  Neurological: A&O x3, Strength is normal and symmetric bilaterally, cranial nerve II-XII are grossly intact, no focal motor deficit, sensory intact to light touch bilaterally.  Skin: Warm, dry and intact. No rashes or erythema. Psychiatric: Anxious mood and affect. Fast speech. Cognition and memory are normal.  ED Treatments / Results  Labs (all labs ordered are listed, but only abnormal results are displayed) Labs Reviewed  COMPREHENSIVE METABOLIC PANEL - Abnormal; Notable for the following components:      Result Value   Potassium 3.3 (*)    AST 48 (*)    All other components within normal limits  ETHANOL - Abnormal; Notable for the following components:   Alcohol, Ethyl (B) 179 (*)    All other components within normal limits  CBC WITH DIFFERENTIAL/PLATELET - Abnormal; Notable for the following components:   RBC 3.67 (*)    Hemoglobin 11.0 (*)    All other components within normal limits    EKG None  Radiology Dg Knee Complete 4 Views Right  Result Date: 06/01/2018 CLINICAL DATA:  Right knee swelling for 1 week. EXAM: RIGHT KNEE - COMPLETE 4+ VIEW COMPARISON:  None. FINDINGS: No fracture.  No bone lesion. Knee joint is normally spaced and aligned.  No arthropathic changes. No joint effusion. Soft tissues are unremarkable. IMPRESSION: Negative. Electronically Signed   By: Amie Portland M.D.   On: 06/01/2018 08:00   Dg Foot Complete Right  Result Date: 06/01/2018 CLINICAL DATA:  Pain EXAM: RIGHT FOOT COMPLETE - 3+ VIEW COMPARISON:  None. FINDINGS: Frontal,  oblique, and lateral views were obtained. There is diffuse soft tissue swelling dorsally. No fracture or dislocation. Joint spaces appear normal. No erosive change. No radiopaque foreign body. IMPRESSION: Diffuse soft tissue swelling dorsally. No fracture or dislocation. No evident arthropathy. No radiopaque foreign body. Electronically Signed   By: Bretta Bang III M.D.   On: 06/01/2018 08:00    Procedures Procedures (including critical care time)  Medications Ordered in ED Medications  oxyCODONE (Oxy IR/ROXICODONE) immediate release tablet 5 mg (5 mg Oral Given 06/01/18 0930)     Initial Impression / Assessment and Plan / ED Course  I have reviewed the triage vital signs and the nursing notes.  Pertinent labs & imaging results that were available during my care of the patient were reviewed by me and considered in my medical decision making (see chart for details).     32yo female with acutely swollen right knee after standing on twisting leg and hearing a "pop." Likely ligamentous injury. Will place in knee immobilizer and crutches and treat pain with oxycodone. Given referral to orthopedic surgeon for follow up.   Generalized body swelling likely due to anasarca from chronic alcohol use and poor nutrition. Evidence of fatty liver disease on prior CT 10 months ago. CMP with normal t. Bili and renal function. Normal albumin.   Patient is safe to discharge with knee immobilizer and crutches. Instructed to follow up with ortho. Given return precautions.   Final Clinical Impressions(s) / ED Diagnoses   Final diagnoses:  Acute pain of right knee    ED Discharge Orders         Ordered    Ambulatory referral to Orthopedic Surgery     06/01/18 1119           Ali Lowe, MD 06/01/18 1121    Azalia Bilis, MD 06/01/18 1556

## 2018-06-01 NOTE — ED Notes (Signed)
Pt ambulated to and from restroom with crutches, without difficulty.

## 2018-06-01 NOTE — ED Notes (Signed)
Paged ortho for knee mobilizer as well as crutches. Ortho tech at bedside.

## 2018-06-01 NOTE — Progress Notes (Signed)
Orthopedic Tech Progress Note Patient Details:  [name] [birth year] [id number]  Ortho Devices Type of Ortho Device: Crutches, Knee Immobilizer Ortho Device/Splint Location: rle Ortho Device/Splint Interventions: Application   Post Interventions Patient Tolerated: Well Instructions Provided: Care of device   Nikki Dom 06/01/2018, 9:57 AM

## 2018-08-04 ENCOUNTER — Encounter (HOSPITAL_COMMUNITY): Payer: Self-pay

## 2018-08-04 ENCOUNTER — Emergency Department (HOSPITAL_COMMUNITY): Payer: Self-pay

## 2018-08-04 ENCOUNTER — Emergency Department (HOSPITAL_COMMUNITY)
Admission: EM | Admit: 2018-08-04 | Discharge: 2018-08-05 | Disposition: A | Discharge status: Home / Self Care | Payer: Self-pay | Attending: Emergency Medicine | Admitting: Emergency Medicine

## 2018-08-04 ENCOUNTER — Other Ambulatory Visit: Payer: Self-pay

## 2018-08-04 DIAGNOSIS — F17210 Nicotine dependence, cigarettes, uncomplicated: Secondary | ICD-10-CM | POA: Insufficient documentation

## 2018-08-04 DIAGNOSIS — F1490 Cocaine use, unspecified, uncomplicated: Secondary | ICD-10-CM

## 2018-08-04 DIAGNOSIS — R1011 Right upper quadrant pain: Secondary | ICD-10-CM | POA: Insufficient documentation

## 2018-08-04 DIAGNOSIS — Z91010 Allergy to peanuts: Secondary | ICD-10-CM | POA: Insufficient documentation

## 2018-08-04 DIAGNOSIS — Z79899 Other long term (current) drug therapy: Secondary | ICD-10-CM | POA: Insufficient documentation

## 2018-08-04 DIAGNOSIS — K7030 Alcoholic cirrhosis of liver without ascites: Secondary | ICD-10-CM | POA: Insufficient documentation

## 2018-08-04 DIAGNOSIS — Z87898 Personal history of other specified conditions: Secondary | ICD-10-CM

## 2018-08-04 DIAGNOSIS — F1491 Cocaine use, unspecified, in remission: Secondary | ICD-10-CM

## 2018-08-04 DIAGNOSIS — F419 Anxiety disorder, unspecified: Secondary | ICD-10-CM | POA: Insufficient documentation

## 2018-08-04 DIAGNOSIS — R112 Nausea with vomiting, unspecified: Secondary | ICD-10-CM | POA: Insufficient documentation

## 2018-08-04 DIAGNOSIS — Z880 Allergy status to penicillin: Secondary | ICD-10-CM | POA: Insufficient documentation

## 2018-08-04 DIAGNOSIS — I10 Essential (primary) hypertension: Secondary | ICD-10-CM | POA: Insufficient documentation

## 2018-08-04 HISTORY — DX: Personal history of other specified conditions: Z87.898

## 2018-08-04 HISTORY — DX: Cocaine use, unspecified, uncomplicated: F14.90

## 2018-08-04 HISTORY — DX: Cocaine use, unspecified, in remission: F14.91

## 2018-08-04 LAB — CBC WITH DIFFERENTIAL/PLATELET
Abs Immature Granulocytes: 0.02 10*3/uL (ref 0.00–0.07)
BASOS ABS: 0 10*3/uL (ref 0.0–0.1)
Basophils Relative: 0 %
EOS PCT: 1 %
Eosinophils Absolute: 0.1 10*3/uL (ref 0.0–0.5)
HEMATOCRIT: 45.3 % (ref 36.0–46.0)
Hemoglobin: 14.5 g/dL (ref 12.0–15.0)
IMMATURE GRANULOCYTES: 0 %
Lymphocytes Relative: 17 %
Lymphs Abs: 1.2 10*3/uL (ref 0.7–4.0)
MCH: 28.7 pg (ref 26.0–34.0)
MCHC: 32 g/dL (ref 30.0–36.0)
MCV: 89.5 fL (ref 80.0–100.0)
MONO ABS: 0.5 10*3/uL (ref 0.1–1.0)
Monocytes Relative: 8 %
NEUTROS ABS: 5.2 10*3/uL (ref 1.7–7.7)
NRBC: 0 % (ref 0.0–0.2)
Neutrophils Relative %: 74 %
Platelets: 268 10*3/uL (ref 150–400)
RBC: 5.06 MIL/uL (ref 3.87–5.11)
RDW: 14.7 % (ref 11.5–15.5)
WBC: 7 10*3/uL (ref 4.0–10.5)

## 2018-08-04 LAB — COMPREHENSIVE METABOLIC PANEL
ALBUMIN: 4.9 g/dL (ref 3.5–5.0)
ALT: 33 U/L (ref 0–44)
AST: 39 U/L (ref 15–41)
Alkaline Phosphatase: 118 U/L (ref 38–126)
Anion gap: 13 (ref 5–15)
BUN: 7 mg/dL (ref 6–20)
CO2: 24 mmol/L (ref 22–32)
CREATININE: 0.73 mg/dL (ref 0.44–1.00)
Calcium: 10 mg/dL (ref 8.9–10.3)
Chloride: 102 mmol/L (ref 98–111)
GFR calc Af Amer: >60 mL/min (ref >60)
GFR calc non Af Amer: >60 mL/min (ref >60)
Glucose, Bld: 127 mg/dL — ABNORMAL HIGH (ref 70–99)
Potassium: 3.2 mmol/L — ABNORMAL LOW (ref 3.5–5.1)
SODIUM: 139 mmol/L (ref 135–145)
TOTAL PROTEIN: 8.7 g/dL — AB (ref 6.5–8.1)
Total Bilirubin: 1 mg/dL (ref 0.3–1.2)

## 2018-08-04 LAB — I-STAT BETA HCG BLOOD, ED (MC, WL, AP ONLY)

## 2018-08-04 LAB — URINALYSIS, ROUTINE W REFLEX MICROSCOPIC
Bacteria, UA: NONE SEEN
Glucose, UA: 50 mg/dL — AB
Hgb urine dipstick: NEGATIVE
Ketones, ur: 20 mg/dL — AB
Leukocytes, UA: NEGATIVE
NITRITE: NEGATIVE
PH: 5 (ref 5.0–8.0)
Protein, ur: >=300 mg/dL — AB
Specific Gravity, Urine: 1.03 (ref 1.005–1.030)
Squamous Epithelial / LPF: >50 — ABNORMAL HIGH (ref 0–5)

## 2018-08-04 LAB — LIPASE, BLOOD: LIPASE: 38 U/L (ref 11–51)

## 2018-08-04 LAB — ETHANOL

## 2018-08-04 MED ORDER — CLONIDINE HCL 0.1 MG PO TABS: 0.1000 mg | ORAL_TABLET | Freq: Every day | ORAL | 0 refills | Status: DC

## 2018-08-04 MED ORDER — POTASSIUM CHLORIDE 10 MEQ/100ML IV SOLN
10.0000 meq | Freq: Once | INTRAVENOUS | Status: AC
  Administered 2018-08-04: 10 meq via INTRAVENOUS
  Filled 2018-08-04: qty 100

## 2018-08-04 MED ORDER — ALUM & MAG HYDROXIDE-SIMETH 200-200-20 MG/5ML PO SUSP
30.0000 mL | Freq: Once | ORAL | Status: AC
  Administered 2018-08-04: 30 mL via ORAL
  Filled 2018-08-04: qty 30

## 2018-08-04 MED ORDER — CHLORDIAZEPOXIDE HCL 25 MG PO CAPS: 25.0000 mg | ORAL_CAPSULE | Freq: Three times a day (TID) | ORAL | 0 refills | Status: DC | PRN

## 2018-08-04 MED ORDER — CLONIDINE HCL 0.1 MG PO TABS
0.1000 mg | ORAL_TABLET | Freq: Once | ORAL | Status: AC
  Administered 2018-08-04: 0.1 mg via ORAL
  Filled 2018-08-04: qty 1

## 2018-08-04 MED ORDER — IOPAMIDOL (ISOVUE-300) INJECTION 61%
100.0000 mL | Freq: Once | INTRAVENOUS | Status: AC | PRN
  Administered 2018-08-04: 100 mL via INTRAVENOUS

## 2018-08-04 MED ORDER — IOPAMIDOL (ISOVUE-300) INJECTION 61%
INTRAVENOUS | Status: AC
  Filled 2018-08-04: qty 100

## 2018-08-04 MED ORDER — CHLORDIAZEPOXIDE HCL 25 MG PO CAPS
25.0000 mg | ORAL_CAPSULE | Freq: Once | ORAL | Status: AC
  Administered 2018-08-04: 25 mg via ORAL
  Filled 2018-08-04: qty 1

## 2018-08-04 MED ORDER — METOCLOPRAMIDE HCL 10 MG PO TABS: 10.0000 mg | ORAL_TABLET | Freq: Three times a day (TID) | ORAL | 0 refills | Status: DC | PRN

## 2018-08-04 MED ORDER — LORAZEPAM 2 MG/ML IJ SOLN: 0.0000 mg | Freq: Two times a day (BID) | INTRAMUSCULAR | Status: DC

## 2018-08-04 MED ORDER — SODIUM CHLORIDE (PF) 0.9 % IJ SOLN
INTRAMUSCULAR | Status: AC
  Filled 2018-08-04: qty 50

## 2018-08-04 MED ORDER — METOCLOPRAMIDE HCL 5 MG/ML IJ SOLN
10.0000 mg | Freq: Once | INTRAMUSCULAR | Status: AC
  Administered 2018-08-04: 10 mg via INTRAVENOUS
  Filled 2018-08-04: qty 2

## 2018-08-04 MED ORDER — ADULT MULTIVITAMIN W/MINERALS CH
1.0000 | ORAL_TABLET | Freq: Every day | ORAL | Status: DC
  Administered 2018-08-04: 1 via ORAL
  Filled 2018-08-04: qty 1

## 2018-08-04 MED ORDER — VITAMIN B-1 100 MG PO TABS: 100.0000 mg | ORAL_TABLET | Freq: Every day | ORAL | Status: DC

## 2018-08-04 MED ORDER — THIAMINE HCL 100 MG/ML IJ SOLN
100.0000 mg | Freq: Every day | INTRAMUSCULAR | Status: DC
  Administered 2018-08-04: 100 mg via INTRAVENOUS
  Filled 2018-08-04: qty 2

## 2018-08-04 MED ORDER — LORAZEPAM 2 MG/ML IJ SOLN: 1.0000 mg | Freq: Four times a day (QID) | INTRAMUSCULAR | Status: DC | PRN

## 2018-08-04 MED ORDER — ONDANSETRON HCL 4 MG/2ML IJ SOLN
4.0000 mg | Freq: Once | INTRAMUSCULAR | Status: AC
  Administered 2018-08-04: 4 mg via INTRAVENOUS
  Filled 2018-08-04: qty 2

## 2018-08-04 MED ORDER — FOLIC ACID 1 MG PO TABS
1.0000 mg | ORAL_TABLET | Freq: Every day | ORAL | Status: DC
  Administered 2018-08-04: 1 mg via ORAL
  Filled 2018-08-04: qty 1

## 2018-08-04 MED ORDER — LORAZEPAM 1 MG PO TABS: 1.0000 mg | ORAL_TABLET | Freq: Four times a day (QID) | ORAL | Status: DC | PRN

## 2018-08-04 MED ORDER — SODIUM CHLORIDE 0.9 % IV BOLUS
1000.0000 mL | Freq: Once | INTRAVENOUS | Status: AC
  Administered 2018-08-04: 980 mL via INTRAVENOUS

## 2018-08-04 MED ORDER — LORAZEPAM 2 MG/ML IJ SOLN
0.0000 mg | Freq: Four times a day (QID) | INTRAMUSCULAR | Status: DC
  Administered 2018-08-04: 4 mg via INTRAVENOUS
  Filled 2018-08-04: qty 1
  Filled 2018-08-04: qty 2

## 2018-08-04 NOTE — ED Triage Notes (Signed)
Patient BIB [facility] from home c/o RUQ pain through to back, n/v,headache, unable to keep anything down x1 week, worse over last 3 days. Last drink of alcohol was 48 hrs ago. Pt has some dizziness when she stands, anxious, slight tremors. 12 lead unremarkable.

## 2018-08-04 NOTE — ED Notes (Signed)
Patient tolerating ginger ale well

## 2018-08-04 NOTE — ED Notes (Signed)
Coupons from [employer] and given to pt. Pt sts her mom was not able to find anyone to come and pick her up, pt is asking if possible for her to stay here until the morning. Social worker Christiane Ha contacted and he will be able to secure  taxi voucher for pt.

## 2018-08-04 NOTE — ED Provider Notes (Signed)
[facility]-EMERGENCY DEPT Provider Note   CSN: [id number] Arrival date & time: 08/04/18  1348     History   Chief Complaint Chief Complaint  Patient presents with  . Abdominal Pain  . Nausea  . Emesis  . Headache    HPI [name] is a 32 y.o. female.  HPI   32 year old female with history of alcohol abuse, cirrhosis of the liver secondary to alcohol use, anxiety, hypertension presenting for evaluation of abdominal pain.  For the past 3 days patient has had persistent pain to her right upper quadrant.  Pain is described as a sharp throbbing sensation with associated nausea and vomiting.  States she has vomited multiple episodes of nonbloody nonbilious content.  She is unable to keep anything down either fluid all solid.  Last drink was yesterday but unable to keep that down.  States that she normally drinks approximately 2-12 40 ounces beer on a daily basis she report remote cocaine use but none recently.  No associated fever, chest pain, shortness of breath, productive cough, dysuria or rash.  She still has an intact gallbladder.  Past Medical History:  Diagnosis Date  . Anxiety   . Cirrhosis of liver (HCC)   . Hypertension   . Polysubstance abuse (HCC)   . Renal disorder    Kidney Infection     Patient Active Problem List   Diagnosis Date Noted  . Acute respiratory failure with hypoxia (HCC) 08/01/2017  . Polysubstance abuse (HCC) 08/01/2017  . Constipation 08/01/2017  . Anemia 08/01/2017  . Rectal bleeding 08/01/2017  . Alcohol withdrawal (HCC) 07/31/2017  . Ventral hernia 10/04/2016  . Incarcerated ventral hernia 10/03/2016  . Anxiety 02/15/2014  . Hypertension 02/15/2014    Past Surgical History:  Procedure Laterality Date  . ARTERY REPAIR    . CESAREAN SECTION     x 5  . MOUTH SURGERY    . TEAR DUCT PROBING    . TUBAL LIGATION    . VENTRAL HERNIA REPAIR N/A 10/03/2016   Procedure: OPEN INCARCERATED HERNIA REPAIR VENTRAL  ADULT;  Surgeon: Axel Filler, MD;  Location: [facility] OR;  Service: General;  Laterality: N/A;     OB History   No obstetric history on file.      Home Medications    Prior to Admission medications   Medication Sig Start Date End Date Taking? Authorizing Provider  albuterol (PROVENTIL HFA;VENTOLIN HFA) 108 (90 Base) MCG/ACT inhaler Inhale 1-2 puffs into the lungs every 6 (six) hours as needed for wheezing or shortness of breath. 09/19/17   Leaphart, Lynann Beaver, PA-C  benzonatate (TESSALON) 100 MG capsule Take 1 capsule (100 mg total) by mouth every 8 (eight) hours. 09/19/17   Rise Mu, PA-C  cephALEXin (KEFLEX) 500 MG capsule Take 1 capsule (500 mg total) by mouth 2 (two) times daily. 09/03/17   Ward, Layla Maw, DO  cloNIDine (CATAPRES) 0.1 MG tablet Take 1 tablet (0.1 mg total) by mouth 2 (two) times daily. 05/01/17   Elpidio Anis, PA-C  diazepam (VALIUM) 5 MG tablet Take 0.5-1 tablets (2.5-5 mg total) by mouth every 6 (six) hours as needed for anxiety. Patient not taking: Reported on 09/19/2017 08/05/17   Alison Murray, MD  folic acid (FOLVITE) 1 MG tablet Take 1 tablet (1 mg total) by mouth daily. Patient not taking: Reported on 09/19/2017 08/05/17   Alison Murray, MD  methocarbamol (ROBAXIN) 500 MG tablet Take 1 tablet (500 mg total) by mouth 2 (two)  times daily. 09/19/17   Rise Mu, PA-C  Multiple Vitamin (MULTIVITAMIN WITH MINERALS) TABS tablet Take 1 tablet by mouth daily. Patient not taking: Reported on 09/19/2017 08/05/17   Alison Murray, MD  neomycin-bacitracin-polymyxin (NEOSPORIN) ointment Apply topically as needed for wound care.    [provider]  omeprazole (PRILOSEC) 40 MG capsule Take 1 capsule (40 mg total) by mouth daily. Patient not taking: Reported on 09/19/2017 09/03/17   Ward, Layla Maw, DO  ondansetron (ZOFRAN ODT) 4 MG disintegrating tablet Take 1 tablet (4 mg total) by mouth every 8 (eight) hours as needed for nausea or vomiting. 09/03/17    Ward, Layla Maw, DO  pantoprazole (PROTONIX) 40 MG tablet Take 1 tablet (40 mg total) by mouth daily. 08/05/17   Alison Murray, MD  predniSONE (DELTASONE) 20 MG tablet Take 3 tablets (60 mg total) by mouth daily. Patient not taking: Reported on 09/19/2017 09/03/17   Ward, Layla Maw, DO  propranolol (INDERAL) 40 MG tablet Take 1 tablet (40 mg total) by mouth 2 (two) times daily. Patient taking differently: Take 20 mg by mouth 2 (two) times daily.  05/01/17   Elpidio Anis, PA-C  thiamine (VITAMIN B-1) 100 MG tablet Take 1 tablet (100 mg total) by mouth daily. Patient not taking: Reported on 09/19/2017 09/03/17   Ward, Layla Maw, DO  thiamine 100 MG tablet Take 1 tablet (100 mg total) by mouth daily. Patient not taking: Reported on 09/19/2017 08/05/17   Alison Murray, MD    Family History Family History  Problem Relation Age of Onset  . CAD Other   . Diabetes Other   . Hypertension Other   . Cancer Other   . Thyroid disease Other     Social History Social History   Tobacco Use  . Smoking status: Current Every Day Smoker    Packs/day: 0.50    Types: Cigarettes  . Smokeless tobacco: Never Used  Substance Use Topics  . Alcohol use: Yes    Comment: 4 0r 5 fourty oz per day  . Drug use: Yes    Frequency: 2.0 times per week    Types: Cocaine    Comment: 02/23/17 last used 2 weeks ago     Allergies   Ibuprofen; Peanut-containing drug products; Acetaminophen; Codeine; Lisinopril; Penicillins; and Ativan [lorazepam]   Review of Systems Review of Systems  All other systems reviewed and are negative.    Physical Exam Updated Vital Signs SpO2 97% Comment: RA  Physical Exam Vitals signs and nursing note reviewed.  Constitutional:      General: She is not in acute distress.    Appearance: She is well-developed.     Comments: Patient appears slightly agitated, tremulous, but mentating appropriately.  HENT:     Head: Atraumatic.     Mouth/Throat:     Mouth: Mucous membranes are  moist.  Eyes:     Extraocular Movements: Extraocular movements intact.     Conjunctiva/sclera: Conjunctivae normal.     Pupils: Pupils are equal, round, and reactive to light.  Neck:     Musculoskeletal: Neck supple.  Cardiovascular:     Rate and Rhythm: Normal rate and regular rhythm.  Pulmonary:     Effort: Pulmonary effort is normal.     Breath sounds: Normal breath sounds.  Abdominal:     Tenderness: There is abdominal tenderness in the right upper quadrant and epigastric area. Negative signs include Murphy's sign and McBurney's sign.     Hernia: No hernia  is present.  Skin:    Findings: No rash.  Neurological:     Mental Status: She is alert and oriented to person, place, and time.      ED Treatments / Results  Labs (all labs ordered are listed, but only abnormal results are displayed) Labs Reviewed  COMPREHENSIVE METABOLIC PANEL - Abnormal; Notable for the following components:      Result Value   Potassium 3.2 (*)    Glucose, Bld 127 (*)    Total Protein 8.7 (*)    All other components within normal limits  URINALYSIS, ROUTINE W REFLEX MICROSCOPIC - Abnormal; Notable for the following components:   Color, Urine AMBER (*)    APPearance CLOUDY (*)    Glucose, UA 50 (*)    Bilirubin Urine SMALL (*)    Ketones, ur 20 (*)    Protein, ur >=300 (*)    Squamous Epithelial / LPF >50 (*)    All other components within normal limits  CBC WITH DIFFERENTIAL/PLATELET  LIPASE, BLOOD  ETHANOL  I-STAT BETA HCG BLOOD, ED ([facility], [facility], [facility] ONLY)    EKG EKG Interpretation  Date/Time:  Thursday August 04 2018 14:09:12 EST Ventricular Rate:  89 PR Interval:    QRS Duration: 89 QT Interval:  380 QTC Calculation: 463 R Axis:   38 Text Interpretation:  Sinus rhythm RSR' in V1 or V2, probably normal variant Confirmed by Geoffery Lyons ([id number]) on 08/04/2018 2:21:25 PM   Radiology No results found.  Procedures Procedures (including critical care time)  Medications Ordered in  ED Medications  LORazepam (ATIVAN) tablet 1 mg (has no administration in time range)    Or  LORazepam (ATIVAN) injection 1 mg (has no administration in time range)  thiamine (VITAMIN B-1) tablet 100 mg (has no administration in time range)    Or  thiamine (B-1) injection 100 mg (has no administration in time range)  folic acid (FOLVITE) tablet 1 mg (has no administration in time range)  multivitamin with minerals tablet 1 tablet (has no administration in time range)  LORazepam (ATIVAN) injection 0-4 mg (4 mg Intravenous Given 08/04/18 1449)    Followed by  LORazepam (ATIVAN) injection 0-4 mg (has no administration in time range)  potassium chloride 10 mEq in 100 mL IVPB (has no administration in time range)  sodium chloride 0.9 % bolus 1,000 mL (980 mLs Intravenous New Bag/Given 08/04/18 1447)  ondansetron (ZOFRAN) injection 4 mg (4 mg Intravenous Given 08/04/18 1448)     Initial Impression / Assessment and Plan / ED Course  I have reviewed the triage vital signs and the nursing notes.  Pertinent labs & imaging results that were available during my care of the patient were reviewed by me and considered in my medical decision making (see chart for details).     BP (!) 175/122 (BP Location: Right Arm)   Pulse 90   Temp 98.5 F (36.9 C) (Oral)   Resp 20   Ht 5\' 5"  (1.651 m)   Wt 65.8 kg   LMP 06/27/2018 (Within Days)   SpO2 99%   BMI 24.13 kg/m    Final Clinical Impressions(s) / ED Diagnoses   Final diagnoses:  None    ED Discharge Orders    None     3:47 PM Patient with significant history of alcohol abuse here with upper abdominal pain nausea and vomiting and unable to keep anything down for the past 3 days.  She reports last alcohol use was yesterday.  Her labs  are reassuring, normal alcohol level, evidence of mild hypokalemia, potassium supplementation given.  Patient given IV fluid, antinausea medication and a CIWA protocol was placed.  Patient is currently  receiving benzodiazepine to help with her alcohol withdrawal.  Limited abdominal ultrasound ordered to rule out biliary disease.  Patient signed out to Felicie Morn, NP who will follow-up on ultrasound results and reassess patient.  If ultrasound is negative and patient still having abdominal pain she may benefit from an abdominal pelvis CT scan for further evaluation.  She will need hydration and should discharge home if she can tolerate p.o.   Fayrene Helper, PA-C 08/04/18 1549    Geoffery Lyons, MD 08/08/18 [phone]

## 2018-08-04 NOTE — ED Notes (Signed)
Bed: WA04 Expected date:  Expected time:  Means of arrival:  Comments: EMS RUQ pain

## 2018-08-04 NOTE — Progress Notes (Signed)
CSW received a call from pt's RN stating pt is up for D/C and needs transport.  CSW notes time is 11:15pm and is providing a taxi due to time constraints and midnight hour approaching.  RN provided address and CSW bring to RN now.  Address: [address] [place], [place]  CSW will continue to follow for D/C needs.  Dorothe Pea. Jameia Makris, LCSW, LCAS, CSI Clinical Social Worker Ph: [phone]

## 2018-08-04 NOTE — ED Notes (Signed)
Pt's mom Inetta Fermo [phone] would like to be called with any updates or changes

## 2018-08-04 NOTE — ED Provider Notes (Signed)
Patient care assumed from B. Laveda Norman, PA-C. Patient is a heavy alcohol user, last intake 48 hours ago. Patient has been having nausea, vomiting x 3 days, associated with RUQ abdominal pain.. Labs reassuring. Hypokalemia addressed with potassium supplementation.  Patient awaiting completion of [place].  Results for orders placed or performed during the hospital encounter of 08/04/18  CBC with Differential  Result Value Ref Range   WBC 7.0 4.0 - 10.5 K/uL   RBC 5.06 3.87 - 5.11 MIL/uL   Hemoglobin 14.5 12.0 - 15.0 g/dL   HCT 16.1 09.6 - 04.5 %   MCV 89.5 80.0 - 100.0 fL   MCH 28.7 26.0 - 34.0 pg   MCHC 32.0 30.0 - 36.0 g/dL   RDW 40.9 81.1 - 91.4 %   Platelets 268 150 - 400 K/uL   nRBC 0.0 0.0 - 0.2 %   Neutrophils Relative % 74 %   Neutro Abs 5.2 1.7 - 7.7 K/uL   Lymphocytes Relative 17 %   Lymphs Abs 1.2 0.7 - 4.0 K/uL   Monocytes Relative 8 %   Monocytes Absolute 0.5 0.1 - 1.0 K/uL   Eosinophils Relative 1 %   Eosinophils Absolute 0.1 0.0 - 0.5 K/uL   Basophils Relative 0 %   Basophils Absolute 0.0 0.0 - 0.1 K/uL   Immature Granulocytes 0 %   Abs Immature Granulocytes 0.02 0.00 - 0.07 K/uL  Comprehensive metabolic panel  Result Value Ref Range   Sodium 139 135 - 145 mmol/L   Potassium 3.2 (L) 3.5 - 5.1 mmol/L   Chloride 102 98 - 111 mmol/L   CO2 24 22 - 32 mmol/L   Glucose, Bld 127 (H) 70 - 99 mg/dL   BUN 7 6 - 20 mg/dL   Creatinine, Ser 7.82 0.44 - 1.00 mg/dL   Calcium 95.6 8.9 - 21.3 mg/dL   Total Protein 8.7 (H) 6.5 - 8.1 g/dL   Albumin 4.9 3.5 - 5.0 g/dL   AST 39 15 - 41 U/L   ALT 33 0 - 44 U/L   Alkaline Phosphatase 118 38 - 126 U/L   Total Bilirubin 1.0 0.3 - 1.2 mg/dL   GFR calc non Af Amer >60 >60 mL/min   GFR calc Af Amer >60 >60 mL/min   Anion gap 13 5 - 15  Lipase, blood  Result Value Ref Range   Lipase 38 11 - 51 U/L  Urinalysis, Routine w reflex microscopic  Result Value Ref Range   Color, Urine AMBER (A) YELLOW   APPearance CLOUDY (A) CLEAR   Specific  Gravity, Urine 1.030 1.005 - 1.030   pH 5.0 5.0 - 8.0   Glucose, UA 50 (A) NEGATIVE mg/dL   Hgb urine dipstick NEGATIVE NEGATIVE   Bilirubin Urine SMALL (A) NEGATIVE   Ketones, ur 20 (A) NEGATIVE mg/dL   Protein, ur >=086 (A) NEGATIVE mg/dL   Nitrite NEGATIVE NEGATIVE   Leukocytes, UA NEGATIVE NEGATIVE   RBC / HPF 0-5 0 - 5 RBC/hpf   Bacteria, UA NONE SEEN NONE SEEN   Squamous Epithelial / LPF >50 (H) 0 - 5   Mucus PRESENT    Hyaline Casts, UA PRESENT   Ethanol  Result Value Ref Range   Alcohol, Ethyl (B) <10 <10 mg/dL  POC beta hCG blood, ED  Result Value Ref Range   I-stat hCG, quantitative <5.0 <5 mIU/mL   Comment 3           Ct Abdomen Pelvis W Contrast  Result Date: 08/04/2018 CLINICAL  DATA:  Initial evaluation for acute right upper quadrant pain. EXAM: CT ABDOMEN AND PELVIS WITH CONTRAST TECHNIQUE: Multidetector CT imaging of the abdomen and pelvis was performed using the standard protocol following bolus administration of intravenous contrast. CONTRAST:  ISOVUE-300 IOPAMIDOL (ISOVUE-300) INJECTION 61% COMPARISON:  Prior CT from 09/19/2017 FINDINGS: Lower chest: Hazy subsegmental atelectatic changes seen dependently within the visualized lung bases. Visualized lungs are otherwise clear. Hepatobiliary: Subcentimeter hypodensity within the peripheral left hepatic lobe, too small the characterize, but may reflect a small cyst or possibly benign hemangioma. This is of doubtful significance. Liver otherwise unremarkable. Gallbladder within normal limits. No biliary dilatation. Pancreas: Pancreas within normal limits. Spleen: Spleen within normal limits. Adrenals/Urinary Tract: Adrenal glands are normal. Kidneys equal in size with symmetric enhancement. No nephrolithiasis, hydronephrosis, or focal enhancing renal mass. No hydroureter. Bladder partially distended. Mild circumferential bladder wall thickening may be related incomplete distension or possibly acute cystitis. Stomach/Bowel:  Stomach within normal limits. No evidence for bowel obstruction. Normal appendix. No acute inflammatory changes seen about the bowels. Vascular/Lymphatic: Normal intravascular enhancement seen throughout the intra-abdominal aorta. Mesenteric vessels patent proximally. No adenopathy. Reproductive: Uterus within normal limits. Left ovary unremarkable. Somewhat heterogeneous cystic right ovarian lesion measures approximately 3.5 x 2.7 x 3.2 cm (series 2, image 6), indeterminate. Other: No free air or fluid. Musculoskeletal: No acute osseous abnormality. No discrete lytic or blastic osseous lesions. IMPRESSION: 1. Mild circumferential bladder wall thickening. While this finding may in part be related to incomplete distension, possible acute cystitis could also have this appearance. Correlation with urinalysis recommended. 2. 3.5 cm right adnexal cystic lesion, which could reflect either a complex right ovarian cystic mass and/or a cluster of smaller physiologic cysts. Further assessment with dedicated pelvic ultrasound suggested for further evaluation. 3. No other acute abnormality within the abdomen and pelvis. Electronically Signed   By: Rise Mu M.D.   On: 08/04/2018 19:03   US Abdomen Limited  Result Date: 08/04/2018 CLINICAL DATA:  RIGHT upper quadrant pain. EXAM: ULTRASOUND ABDOMEN LIMITED RIGHT UPPER QUADRANT COMPARISON:  None. FINDINGS: Gallbladder: Gallbladder is not distended. No gallbladder wall thickening. Small single small polyp measuring 5 mm. No pericholecystic fluid. Negative sonographic Murphy's sign. Common bile duct: Diameter: Normal at 4 mm. Liver: Mild liver heterogeneity no biliary duct dilatation. Portal vein is patent on color Doppler imaging with normal direction of blood flow towards the liver. IMPRESSION: 1. Normal gallbladder. 2. No biliary duct dilatation. 3. Heterogeneous liver echotexture may represent hepatic steatosis Electronically Signed   By: Genevive Bi M.D.    On: 08/04/2018 15:45   Ultrasound shows normal gall bladder. Patient continued to exhibit pain, CT obtained. Results reviewed and shared with patient. Patient has history of ovarian cysts.  After fluids and medications, hypertension improved, abdominal pain improved. No current vomiting. Mild nausea persists. Patient is tolerating oral fluids. She is able to eat at present without difficulty. Patient a little somnolent, but easily aroused, and then well-oriented. CIWA score of 7.  Patient will be discharged home with librium, clonidine, and reglan. Patient states she does not have money for medications. Care management contacted at patient provided match assistance. Care instructions provided and return precautions discussed. Patient appears safe for discharge at this time.   Felicie Morn, NP 08/04/18 [id number]    Lorre Nick, MD 08/05/18 [phone]

## 2018-08-10 NOTE — Progress Notes (Deleted)
Patient ID: [name], female   DOB: 04/26/1986, 32 y.o.   MRN: [id number]   After being seen in the ED 08/04/2018 for RUQ pain, N/V, and hypokalemia.  From ED note: Ultrasound shows normal gall bladder. Patient continued to exhibit pain, CT obtained. Results reviewed and shared with patient. Patient has history of ovarian cysts.  After fluids and medications, hypertension improved, abdominal pain improved. No current vomiting. Mild nausea persists. Patient is tolerating oral fluids. She is able to eat at present without difficulty. Patient a little somnolent, but easily aroused, and then well-oriented. CIWA score of 7.  Patient will be discharged home with librium, clonidine, and reglan. Patient states she does not have money for medications. Care management contacted at patient provided match assistance. Care instructions provided and return precautions discussed. Patient appears safe for discharge at this time.

## 2018-08-18 ENCOUNTER — Inpatient Hospital Stay: Payer: Self-pay

## 2019-06-03 IMAGING — CT CT ABD-PELV W/ CM
2 of 4 series · 16 of 46 positions shown, 18 images · IV contrast (ISOVUE)
Comparison: Abdominal CT dated 03/05/2017 and ultrasound dated
08/11/2017

CLINICAL DATA: 31-year-old female with abdominal pain.

EXAM:
CT ABDOMEN AND PELVIS WITH CONTRAST
TECHNIQUE: Multidetector CT imaging of the abdomen and pelvis was performed
using the standard protocol following bolus administration of
intravenous contrast.
CONTRAST:  100 cc [id number]

[Series 2: axial st · axial · 0.71mm/px · z∈[+1226,+1616]mm · 13 of 88 slices shown, 15 images]
[im 5/88  soft-tissue]
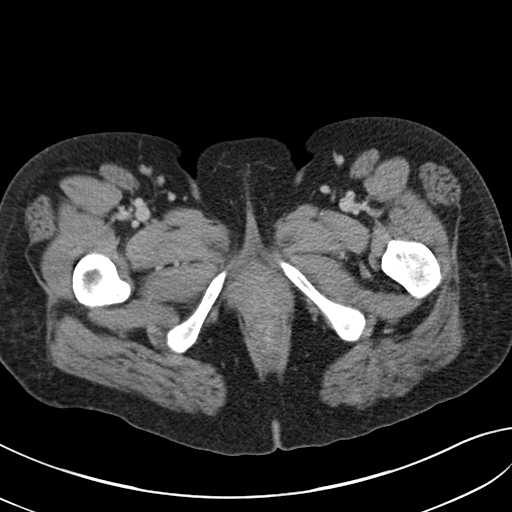
[im 5/88  bone]
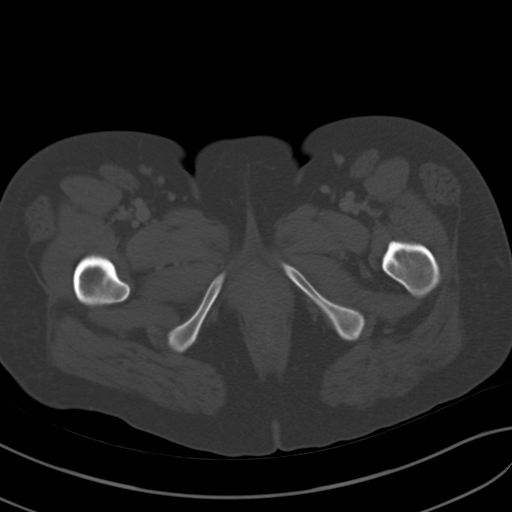
[im 14/88  soft-tissue]
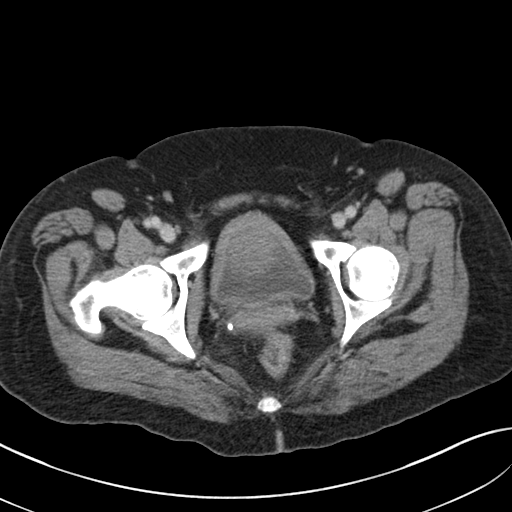
[im 19/88  soft-tissue]
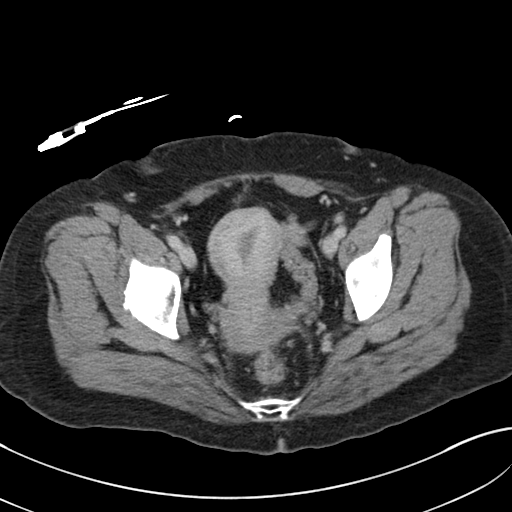
[im 23/88  soft-tissue]
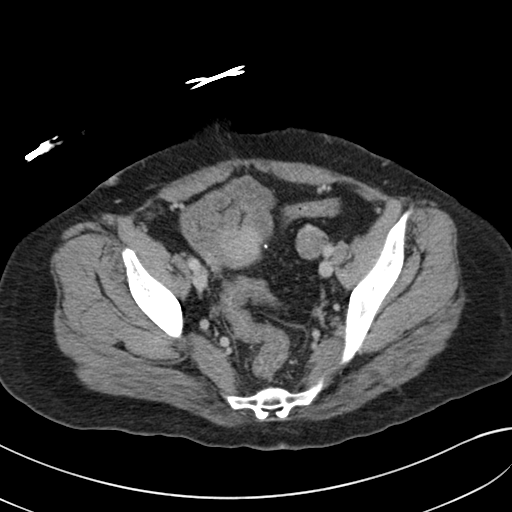
[im 33/88  soft-tissue]
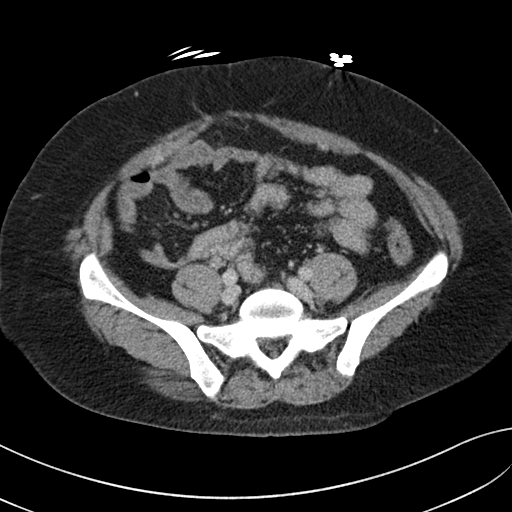
[im 37/88  soft-tissue]
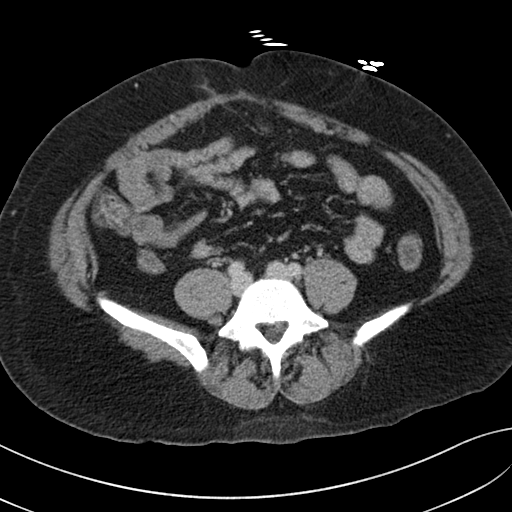
[im 46/88  soft-tissue]
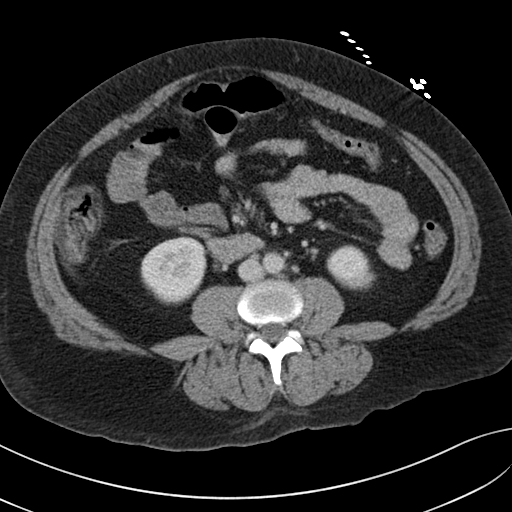
[im 51/88  soft-tissue]
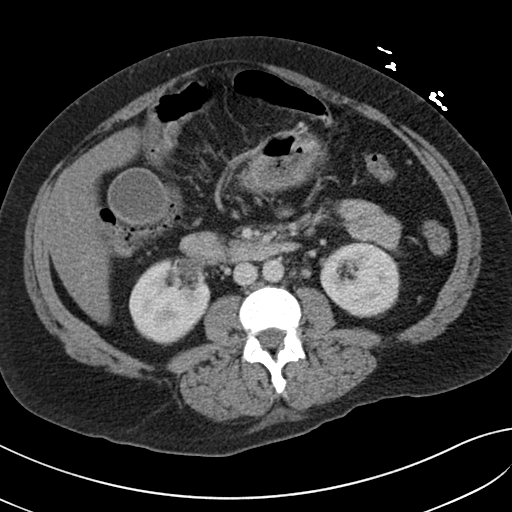
[im 55/88  soft-tissue]
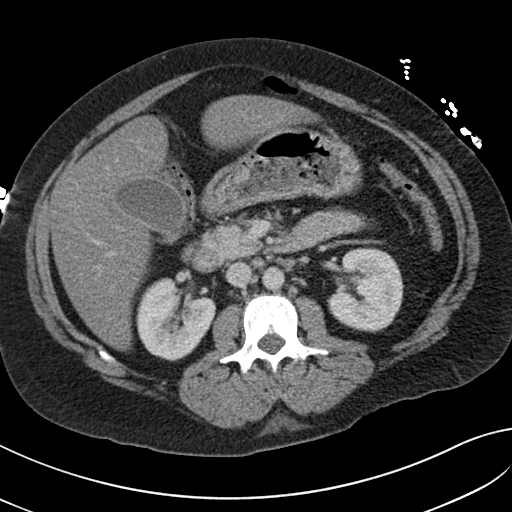
[im 55/88  bone]
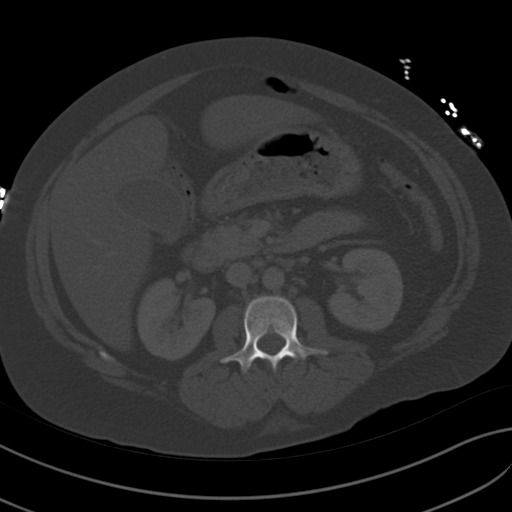
[im 65/88  soft-tissue]
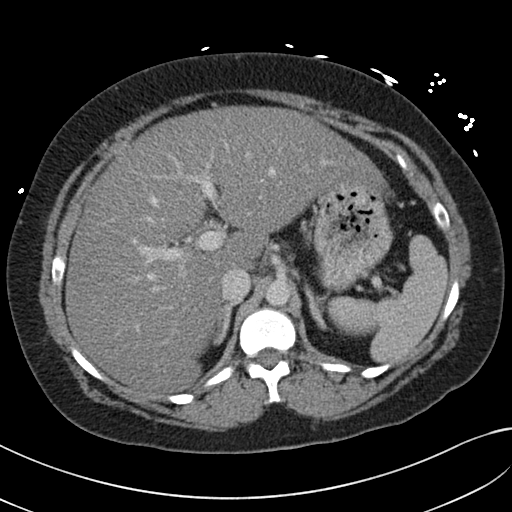
[im 69/88  soft-tissue]
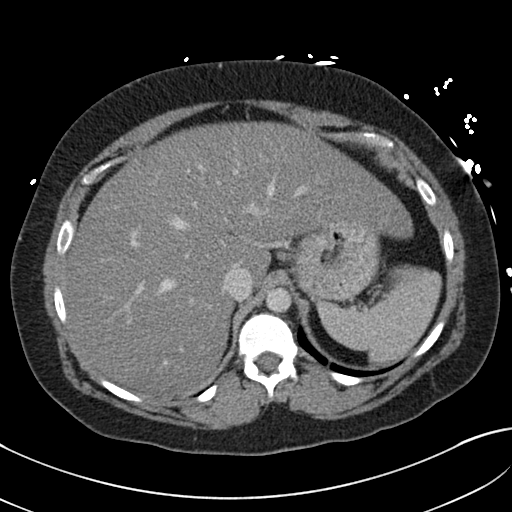
[im 74/88  soft-tissue]
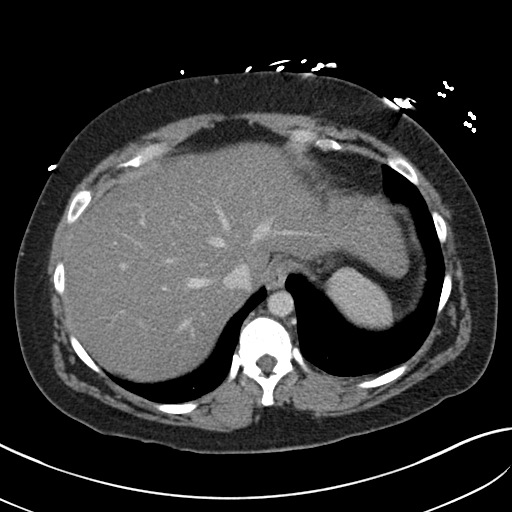
[im 83/88  soft-tissue]
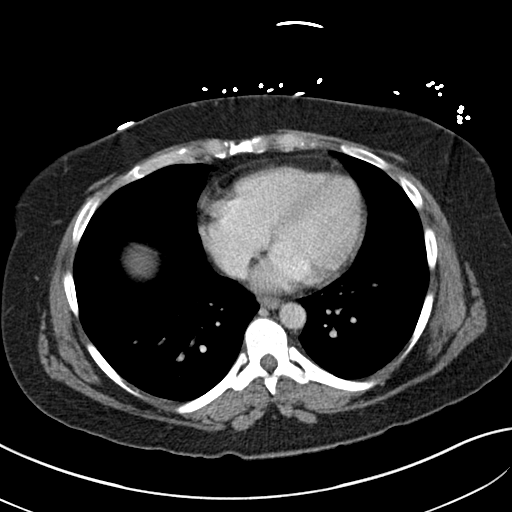

[Series 4: coronal st · coronal · 0.71mm/px · 3 of 95 slices shown]
[im 32/95  soft-tissue]
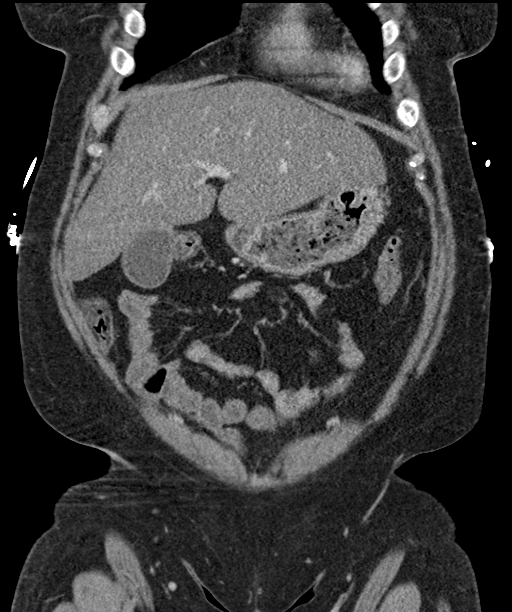
[im 42/95  soft-tissue]
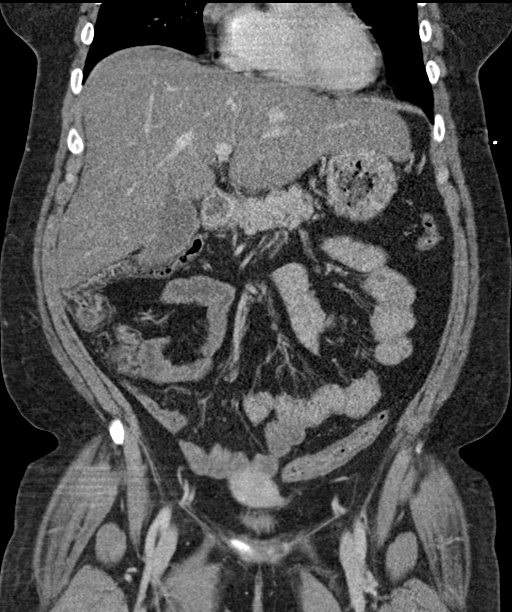
[im 53/95  soft-tissue]
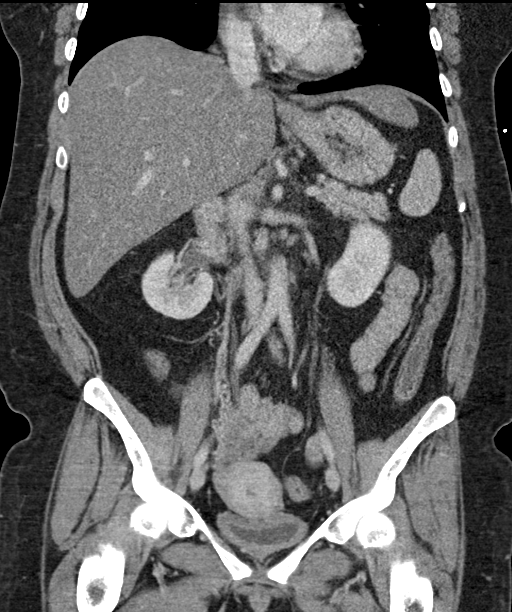

[16 of 46 positions shown; findings below may reference images not displayed]

FINDINGS: Lower chest: The visualized lung bases are clear.

No intra-abdominal free air or free fluid.

Hepatobiliary: Diffuse fatty infiltration of the liver. No
intrahepatic biliary ductal dilatation. The gallbladder is
unremarkable.

Pancreas: Unremarkable. No pancreatic ductal dilatation or
surrounding inflammatory changes.

Spleen: Normal in size without focal abnormality.

Adrenals/Urinary Tract: Adrenal glands are unremarkable. Kidneys are
normal, without renal calculi, focal lesion, or hydronephrosis.
There is apparent diffuse thickening of the bladder wall which may
be partly related to underdistention. Cystitis is not excluded.
Correlation with urinalysis recommended.

Stomach/Bowel: There is no bowel obstruction or active inflammation.
Diffuse thickened appearance of the colon likely related to
underdistention. The appendix is unremarkable.

Vascular/Lymphatic: No significant vascular findings are present. No
enlarged abdominal or pelvic lymph nodes.

Reproductive: The uterus is anteverted. Anterior uterine C-section
scar noted. The ovaries are grossly unremarkable. Multiple follicles
seen in the right ovary.

Other: None

Musculoskeletal: No acute or significant osseous findings.
IMPRESSION: 1. No bowel obstruction or active inflammation.  Normal appendix.
2. Fatty liver.
3. Thickened appearance of the bladder wall may be related to
underdistention. Correlation with clinical exam and urinalysis
recommended to exclude cystitis.

## 2019-06-03 IMAGING — DX DG CHEST 1V PORT
1 series · 1 of 1 positions shown · non-contrast
Comparison: 07/31/2017

CLINICAL DATA: Wheezing and dyspnea

EXAM:
PORTABLE CHEST 1 VIEW

[chest ap]
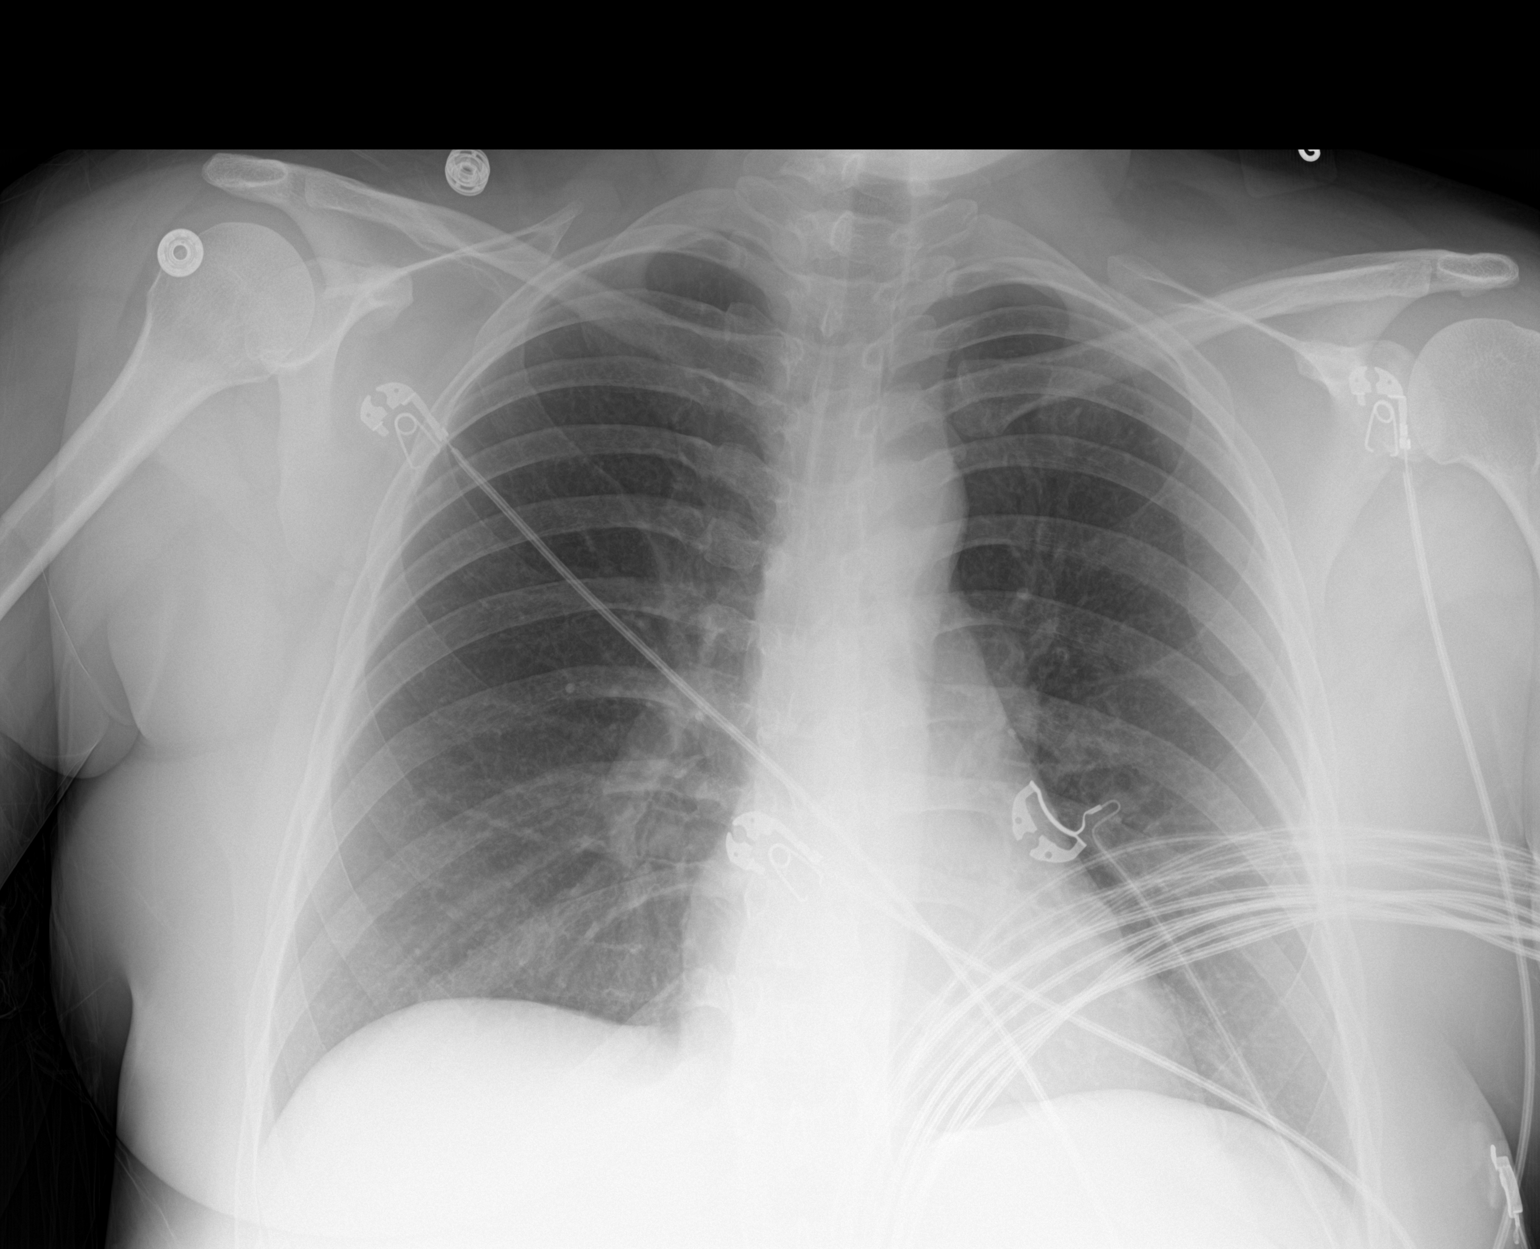

[1 of 1 positions shown; findings below may reference images not displayed]

FINDINGS: The heart size and mediastinal contours are within normal limits.
Both lungs are clear. The visualized skeletal structures are
unremarkable.
IMPRESSION: No active disease.

## 2019-08-16 ENCOUNTER — Other Ambulatory Visit: Payer: Self-pay

## 2019-08-16 ENCOUNTER — Emergency Department (HOSPITAL_COMMUNITY)
Admission: EM | Admit: 2019-08-16 | Discharge: 2019-08-17 | Disposition: A | Discharge status: Home / Self Care | Payer: Self-pay | Attending: Emergency Medicine | Admitting: Emergency Medicine

## 2019-08-16 ENCOUNTER — Encounter (HOSPITAL_COMMUNITY): Payer: Self-pay

## 2019-08-16 DIAGNOSIS — Z79899 Other long term (current) drug therapy: Secondary | ICD-10-CM | POA: Insufficient documentation

## 2019-08-16 DIAGNOSIS — F17210 Nicotine dependence, cigarettes, uncomplicated: Secondary | ICD-10-CM | POA: Insufficient documentation

## 2019-08-16 DIAGNOSIS — Z91010 Allergy to peanuts: Secondary | ICD-10-CM | POA: Insufficient documentation

## 2019-08-16 DIAGNOSIS — F1410 Cocaine abuse, uncomplicated: Secondary | ICD-10-CM | POA: Insufficient documentation

## 2019-08-16 DIAGNOSIS — Y905 Blood alcohol level of 100-119 mg/100 ml: Secondary | ICD-10-CM | POA: Insufficient documentation

## 2019-08-16 DIAGNOSIS — F10921 Alcohol use, unspecified with intoxication delirium: Secondary | ICD-10-CM | POA: Insufficient documentation

## 2019-08-16 DIAGNOSIS — I10 Essential (primary) hypertension: Secondary | ICD-10-CM | POA: Insufficient documentation

## 2019-08-16 LAB — I-STAT CHEM 8, ED
BUN: 13 mg/dL (ref 6–20)
Calcium, Ion: 1.03 mmol/L — ABNORMAL LOW (ref 1.15–1.40)
Chloride: 106 mmol/L (ref 98–111)
Creatinine, Ser: 1 mg/dL (ref 0.44–1.00)
Glucose, Bld: 166 mg/dL — ABNORMAL HIGH (ref 70–99)
HCT: 46 % (ref 36.0–46.0)
Hemoglobin: 15.6 g/dL — ABNORMAL HIGH (ref 12.0–15.0)
Potassium: 3.8 mmol/L (ref 3.5–5.1)
Sodium: 133 mmol/L — ABNORMAL LOW (ref 135–145)
TCO2: 20 mmol/L — ABNORMAL LOW (ref 22–32)

## 2019-08-16 LAB — I-STAT BETA HCG BLOOD, ED (MC, WL, AP ONLY): I-stat hCG, quantitative: <5 m[IU]/mL (ref <5)

## 2019-08-16 MED ORDER — SODIUM CHLORIDE 0.9 % IV BOLUS
1000.0000 mL | Freq: Once | INTRAVENOUS | Status: AC
  Administered 2019-08-16: 1000 mL via INTRAVENOUS

## 2019-08-16 NOTE — ED Notes (Signed)
Pt continuously yelling at this writer stating "You are so mean to me. I've never done anything to you." after again encouraging pt to wipe and clean herself. Pt yelling at staff saying, "I can't feel my hands." while grasping toilet paper, pointing, and moving hands without complication.

## 2019-08-16 NOTE — ED Notes (Signed)
Mother- Morene Rankins [phone]

## 2019-08-16 NOTE — ED Notes (Signed)
Pt ambulated to the bathroom with minimal assistance

## 2019-08-16 NOTE — ED Notes (Signed)
Pt using phone in the room and able to locate each number herself on the phone to dial. Pt also stated she was able to see the trash can.

## 2019-08-16 NOTE — ED Provider Notes (Addendum)
[facility]-EMERGENCY DEPT Provider Note   CSN: [id number] Arrival date & time: 08/16/19  2203     History Chief Complaint  Patient presents with  . Alcohol Intoxication    LEVEL 5 CAVEAT 2/2 INTOXICATION  [name] is a 33 y.o. female.  33 y/o female with hx of liver cirrhosis, polysubstance and cocaine abuse, ETOH abuse presents to the ED c/o bilateral blindness. Patient claiming she is unable to see out of both of her eyes. Unable to expand on symptoms or tell me when they started. She continues to make sentiments such as "I'm a good person", "I'm not trailer trash", "why are you being mean to me?". Patient clinically intoxicated. EMS received reports of consumption of 3 40oz beers PTA as well as a 6 pack of [employer] Hard. Patient denies consuming anything other than alcohol tonight. Denies drug use. EMS state that patient was ambulatory on scene and able to crawl to her boyfriend. She pulled the Christmas tree off of the registration counter upon arrival.  The history is provided by the patient. No language interpreter was used.  Alcohol Intoxication       Past Medical History:  Diagnosis Date  . Anxiety   . Cirrhosis of liver (HCC)   . Cocaine use 08/04/2018  . History of cocaine use 08/04/2018  . Hypertension   . Polysubstance abuse (HCC)   . Renal disorder    Kidney Infection     Patient Active Problem List   Diagnosis Date Noted  . Acute respiratory failure with hypoxia (HCC) 08/01/2017  . Polysubstance abuse (HCC) 08/01/2017  . Constipation 08/01/2017  . Anemia 08/01/2017  . Rectal bleeding 08/01/2017  . Alcohol withdrawal (HCC) 07/31/2017  . Ventral hernia 10/04/2016  . Incarcerated ventral hernia 10/03/2016  . Anxiety 02/15/2014  . Hypertension 02/15/2014    Past Surgical History:  Procedure Laterality Date  . ARTERY REPAIR    . CESAREAN SECTION     x 5  . MOUTH SURGERY    . TEAR DUCT PROBING    . TUBAL LIGATION    .  VENTRAL HERNIA REPAIR N/A 10/03/2016   Procedure: OPEN INCARCERATED HERNIA REPAIR VENTRAL ADULT;  Surgeon: Axel FillerArmando Ramirez, MD;  Location: [facility] OR;  Service: General;  Laterality: N/A;     OB History   No obstetric history on file.     Family History  Problem Relation Age of Onset  . CAD Other   . Diabetes Other   . Hypertension Other   . Cancer Other   . Thyroid disease Other     Social History   Tobacco Use  . Smoking status: Current Every Day Smoker    Packs/day: 0.50    Types: Cigarettes  . Smokeless tobacco: Never Used  Substance Use Topics  . Alcohol use: Yes    Comment: 4 0r 5 fourty oz per day  . Drug use: Yes    Frequency: 2.0 times per week    Types: Cocaine    Comment: Last time used cocaine was couple months ago, today is 08/04/2018    Home Medications Prior to Admission medications   Medication Sig Start Date End Date Taking? Authorizing Provider  albuterol (PROVENTIL HFA;VENTOLIN HFA) 108 (90 Base) MCG/ACT inhaler Inhale 1-2 puffs into the lungs every 6 (six) hours as needed for wheezing or shortness of breath. 09/19/17   Leaphart, Lynann BeaverKenneth T, PA-C  chlordiazePOXIDE (LIBRIUM) 25 MG capsule Take 1 capsule (25 mg total) by mouth 3 (three) times  daily as needed for withdrawal. 08/04/18   Etta Quill, NP  cloNIDine (CATAPRES) 0.1 MG tablet Take 1 tablet (0.1 mg total) by mouth 2 (two) times daily. Patient not taking: Reported on 08/04/2018 05/01/17   Charlann Lange, PA-C  cloNIDine (CATAPRES) 0.1 MG tablet Take 1 tablet (0.1 mg total) by mouth daily. 08/04/18   Etta Quill, NP  diazepam (VALIUM) 5 MG tablet Take 0.5-1 tablets (2.5-5 mg total) by mouth every 6 (six) hours as needed for anxiety. Patient not taking: Reported on 09/19/2017 08/05/17   Robbie Lis, MD  folic acid (FOLVITE) 1 MG tablet Take 1 tablet (1 mg total) by mouth daily. Patient not taking: Reported on 09/19/2017 08/05/17   Robbie Lis, MD  methocarbamol (ROBAXIN) 500 MG tablet Take 1 tablet  (500 mg total) by mouth 2 (two) times daily. Patient not taking: Reported on 08/04/2018 09/19/17   Ocie Cornfield T, PA-C  metoCLOPramide (REGLAN) 10 MG tablet Take 1 tablet (10 mg total) by mouth every 8 (eight) hours as needed for nausea. 08/04/18   Etta Quill, NP  Multiple Vitamin (MULTIVITAMIN WITH MINERALS) TABS tablet Take 1 tablet by mouth daily. Patient not taking: Reported on 09/19/2017 08/05/17   Robbie Lis, MD  omeprazole (PRILOSEC) 40 MG capsule Take 1 capsule (40 mg total) by mouth daily. 09/03/17   Ward, Delice Bison, DO  ondansetron (ZOFRAN ODT) 4 MG disintegrating tablet Take 1 tablet (4 mg total) by mouth every 8 (eight) hours as needed for nausea or vomiting. 09/03/17   Ward, Delice Bison, DO  predniSONE (DELTASONE) 20 MG tablet Take 3 tablets (60 mg total) by mouth daily. Patient not taking: Reported on 09/19/2017 09/03/17   Ward, Delice Bison, DO  propranolol (INDERAL) 40 MG tablet Take 1 tablet (40 mg total) by mouth 2 (two) times daily. Patient not taking: Reported on 08/04/2018 05/01/17   Charlann Lange, PA-C  thiamine (VITAMIN B-1) 100 MG tablet Take 1 tablet (100 mg total) by mouth daily. Patient not taking: Reported on 09/19/2017 09/03/17   Ward, Delice Bison, DO    Allergies    Ibuprofen, Peanut-containing drug products, Penicillins, Acetaminophen, Codeine, Lisinopril, and Ativan [lorazepam]  Review of Systems   Review of Systems  Unable to perform ROS: Mental status change   Patient intoxicated    Physical Exam Updated Vital Signs BP (!) 137/105 (BP Location: Right Arm)   Pulse 94   Temp 97.9 F (36.6 C) (Oral)   Resp 18   SpO2 98%   Physical Exam Vitals and nursing note reviewed.  Constitutional:      Appearance: She is well-developed. She is not diaphoretic.     Comments: Alert, waving hands around. Agitated. Clinically intoxicated with slurred speech.  HENT:     Head: Normocephalic and atraumatic.  Eyes:     General: No scleral icterus.    Extraocular  Movements: Extraocular movements intact.     Conjunctiva/sclera: Conjunctivae normal.     Pupils: Pupils are equal, round, and reactive to light.     Comments: 86mm, sluggish  Cardiovascular:     Comments: Borderline tachycardia; HR mostly in upper 90's Pulmonary:     Effort: Pulmonary effort is normal. No respiratory distress.     Comments: Respirations even and unlabored Musculoskeletal:        General: Normal range of motion.     Cervical back: Normal range of motion.  Skin:    General: Skin is warm and dry.     Coloration: Skin  is not pale.     Findings: No erythema or rash.  Neurological:     Comments: GCS 15. Will make direct eye contact with myself and staff when increasingly agitated. Patient moving all extremities spontaneously.   Psychiatric:        Mood and Affect: Mood is anxious.        Speech: Speech is slurred.        Behavior: Behavior is agitated and slowed.     ED Results / Procedures / Treatments   Labs (all labs ordered are listed, but only abnormal results are displayed) Labs Reviewed  ETHANOL - Abnormal; Notable for the following components:      Result Value   Alcohol, Ethyl (B) 114 (*)    All other components within normal limits  RAPID URINE DRUG SCREEN, HOSP PERFORMED - Abnormal; Notable for the following components:   Cocaine POSITIVE (*)    All other components within normal limits  HEPATIC FUNCTION PANEL - Abnormal; Notable for the following components:   AST 239 (*)    ALT 105 (*)    Alkaline Phosphatase 129 (*)    All other components within normal limits  I-STAT CHEM 8, ED - Abnormal; Notable for the following components:   Sodium 133 (*)    Glucose, Bld 166 (*)    Calcium, Ion 1.03 (*)    TCO2 20 (*)    Hemoglobin 15.6 (*)    All other components within normal limits  OSMOLALITY  I-STAT BETA HCG BLOOD, ED ([facility], [facility], [facility] ONLY)    EKG None  Radiology No results found.  Procedures Procedures (including critical care  time)  Medications Ordered in ED Medications  sodium chloride 0.9 % bolus 1,000 mL (1,000 mLs Intravenous New Bag/Given 08/16/19 2351)  ondansetron (ZOFRAN) injection 4 mg (4 mg Intravenous Given 08/17/19 0115)  pantoprazole (PROTONIX) injection 40 mg (40 mg Intravenous Given 08/17/19 0114)    ED Course  I have reviewed the triage vital signs and the nursing notes.  Pertinent labs & imaging results that were available during my care of the patient were reviewed by me and considered in my medical decision making (see chart for details).  See A/P for detailed course.    MDM Rules/Calculators/A&P                       10:30 PM Patient arriving to ED clinically intoxicated. Reporting bilateral vision loss, but certain gestures suggestive of intact vision. Patient very agitated, but nontoxic. Moving all extremities spontaneously. Ambulatory unassisted with EMS. No evidence of facial asymmetry. PERRL.  Suspect hysteria; however, will add serum osmolality to assess for methanol toxicity. Chemistry added as well as UDS, ethanol level. Will forgo imaging at this time pending metabolization of alcohol and patient reassessment. No focal deficits noted on exam.  12:13 AM Per RN, patient now reporting that her vision has returned. She is requesting something to drink.  12:50 AM Mother requested to speak with me over the phone. Mother states that she is used to her child drinking and she is concerned that she may have had a stroke tonight. Mother reports history of ETOH cirrhosis in the patient and "knows something is wrong"; states she "wouldn't have called the ambulance for no reason" and "you think I want to expose my daughter to COVID".   I tried to reassure the mother that the patient's condition was stable and improving and that our work up in the ED today  has not been suggestive of any acute process. Mother went on to state that she, herself, has history of a stroke associated with vision  loss in one of her eyes. I attempted to explain that BILATERAL vision loss in a patient would not be associated with a single stroke episode; this would require bilateral isolated infarcts which is not only extremely rare, but the patient's vision changes have also spontaneously resolved which is inconsistent with acute CVA concern.  Mother's voice continued to become raised and more aggressive. Mother went on to accuse me of not doing anything for her daughter. States "I'm going to come there and whoop your ass and that's not a threat it's a promise". I calmly told mother that I was willing to continue my conversation with her if she would lower her voice and not use threatening language. Mother continued yelling at me and shouting profanities at which time the decision was made by myself to terminate the phone call. RN notified that I would be happy to speak with mother again should she call back and converse in a calm and civilized manner.  1:02 AM Went to reassess patient who has ambulated back to her room with IV pole in tow. She denies any changes to her vision; states her vision is normal and intact. Patient makes good eye contact during conversation. She is only complaining of nausea. She states her nausea is chronic and associated with her drinking. Patient requesting ginger ale and nausea medication. RN notified to give IV Zofran ordered. Will add protonix given ETOH hx. Ginger ale provided. Patient now calling me the "nicest person ever".  Osmolality pending; however return of vision negates concern for methanol ingestion. UDS pending, but would not impact clinical course or disposition. No significant electrolyte derangements on work up. Anion gap is 7 w/o concern for metabolic acidosis. Normal creatinine. Stable hemoglobin compared to 1 year ago. Hepatic function added given maternal concern.  1:17 AM Per RN, patient's boyfriend is en route to pick up from the ED. Tolerating PO water and  ginger ale.  1:28 AM Boyfriend has arrived to pick up patient. Patient reports to RN that she would like to f/u on her LFTs through MyChart. Given chronic ETOH abuse with known liver disease history, feel this is reasonable. On chart review she does have hx of intermittent transaminitis which was improved as of 05/2018. These results can be followed by her PCP, if indicated.   I previously told patient that she would benefit from outpatient GI follow up. Patient verbalized understanding, but expressed that this is difficult for her as she is uninsured. Departed the ED in stable condition.  2:06 AM LFTs reviewed after discharge. Elevated AST/ALT today, but similar in comparison to 2 years ago with waxing and waning LFT changes documented since 2016. Do not feel this warrants emergent ED return or patient call back.   Final Clinical Impression(s) / ED Diagnoses Final diagnoses:  Alcohol intoxication with delirium [facility])    Rx / DC Orders ED Discharge Orders    None       Antony Madura, PA-C 08/17/19 0208    Antony Madura, PA-C 08/17/19 5701    Paula Libra, MD 08/17/19 [phone]

## 2019-08-16 NOTE — ED Triage Notes (Signed)
Per ems: Pt coming from home c/o "I cant see. I cant see." Pt has her eyes closed. Reports ETOH use of 3 40oz and a 6 pack of strawberry [employer]ikes hard. Pt ambulatory on scene and able to see boyfriend and crawl to him.

## 2019-08-16 NOTE — ED Notes (Addendum)
Pt's mother called to talk to pt. Pt stated "I cant talk right now mom. I am trying to throw up."

## 2019-08-16 NOTE — ED Notes (Signed)
Pt upset that we cannot provide her with a rubber band for her hair.

## 2019-08-16 NOTE — ED Notes (Signed)
Pt walking around the room without assistance.

## 2019-08-16 NOTE — ED Notes (Signed)
Pt found her own hair tie

## 2019-08-16 NOTE — ED Notes (Signed)
Pt upset due to this writer encouraging pt to wipe and clean herself.

## 2019-08-16 NOTE — ED Notes (Signed)
Pt upset and yelling at staff because we will not give her a ginger ale. Pt purposely trying to induce vomiting in trash can without success.

## 2019-08-17 LAB — HEPATIC FUNCTION PANEL
ALT: 105 U/L — ABNORMAL HIGH (ref 0–44)
AST: 239 U/L — ABNORMAL HIGH (ref 15–41)
Albumin: 3.9 g/dL (ref 3.5–5.0)
Alkaline Phosphatase: 129 U/L — ABNORMAL HIGH (ref 38–126)
Bilirubin, Direct: 0.2 mg/dL (ref 0.0–0.2)
Indirect Bilirubin: 0.5 mg/dL (ref 0.3–0.9)
Total Bilirubin: 0.7 mg/dL (ref 0.3–1.2)
Total Protein: 7.5 g/dL (ref 6.5–8.1)

## 2019-08-17 LAB — RAPID URINE DRUG SCREEN, HOSP PERFORMED
Amphetamines: NOT DETECTED
Barbiturates: NOT DETECTED
Benzodiazepines: NOT DETECTED
Cocaine: POSITIVE — AB
Opiates: NOT DETECTED
Tetrahydrocannabinol: NOT DETECTED

## 2019-08-17 LAB — ETHANOL: Alcohol, Ethyl (B): 114 mg/dL — ABNORMAL HIGH (ref <10)

## 2019-08-17 MED ORDER — PANTOPRAZOLE SODIUM 40 MG IV SOLR
40.0000 mg | Freq: Once | INTRAVENOUS | Status: AC
  Administered 2019-08-17: 01:00:00 40 mg via INTRAVENOUS
  Filled 2019-08-17: qty 40

## 2019-08-17 MED ORDER — ONDANSETRON HCL 4 MG/2ML IJ SOLN
4.0000 mg | Freq: Once | INTRAMUSCULAR | Status: AC
  Administered 2019-08-17: 4 mg via INTRAVENOUS
  Filled 2019-08-17: qty 2

## 2019-08-17 NOTE — ED Notes (Signed)
Pt found drinking water out of the sink. Pt informed that she should not be drinking water out of the sink. Pt upset at staff and yelling "yall are being so mean to me"

## 2019-08-17 NOTE — ED Notes (Signed)
Pt pointing at and requesting the trash can. I asked her if she could see the trash can and she indicated that she could.

## 2019-08-17 NOTE — ED Notes (Signed)
Pt stated her ride is here. Pt given the option to wait for her hepatic function panel results or look at them on mychart. Pt stated she wants to leave and look online.

## 2019-08-17 NOTE — ED Notes (Signed)
Pt's mother called the hospital and yelled at staff requesting that pt have a CT scan because "I know her and I know she had a stroke." Staff transferred the called to provider to further discuss plan of care.

## 2020-03-20 ENCOUNTER — Emergency Department (HOSPITAL_COMMUNITY): Payer: Self-pay

## 2020-03-20 ENCOUNTER — Inpatient Hospital Stay (HOSPITAL_COMMUNITY)
Admission: EM | Admit: 2020-03-20 | Discharge: 2020-03-22 | DRG: 065 | Discharge status: Against Medical Advice | Payer: Self-pay | Attending: Internal Medicine | Admitting: Internal Medicine

## 2020-03-20 DIAGNOSIS — Z888 Allergy status to other drugs, medicaments and biological substances status: Secondary | ICD-10-CM

## 2020-03-20 DIAGNOSIS — Z880 Allergy status to penicillin: Secondary | ICD-10-CM

## 2020-03-20 DIAGNOSIS — I639 Cerebral infarction, unspecified: Secondary | ICD-10-CM | POA: Diagnosis present

## 2020-03-20 DIAGNOSIS — I160 Hypertensive urgency: Secondary | ICD-10-CM

## 2020-03-20 DIAGNOSIS — Z885 Allergy status to narcotic agent status: Secondary | ICD-10-CM

## 2020-03-20 DIAGNOSIS — Z886 Allergy status to analgesic agent status: Secondary | ICD-10-CM

## 2020-03-20 DIAGNOSIS — I10 Essential (primary) hypertension: Secondary | ICD-10-CM | POA: Diagnosis present

## 2020-03-20 DIAGNOSIS — Z9114 Patient's other noncompliance with medication regimen: Secondary | ICD-10-CM

## 2020-03-20 DIAGNOSIS — I6381 Other cerebral infarction due to occlusion or stenosis of small artery: Principal | ICD-10-CM | POA: Diagnosis present

## 2020-03-20 DIAGNOSIS — F1010 Alcohol abuse, uncomplicated: Secondary | ICD-10-CM | POA: Diagnosis present

## 2020-03-20 DIAGNOSIS — Z91138 Patient's unintentional underdosing of medication regimen for other reason: Secondary | ICD-10-CM

## 2020-03-20 DIAGNOSIS — Z5329 Procedure and treatment not carried out because of patient's decision for other reasons: Secondary | ICD-10-CM | POA: Diagnosis present

## 2020-03-20 DIAGNOSIS — J40 Bronchitis, not specified as acute or chronic: Secondary | ICD-10-CM

## 2020-03-20 DIAGNOSIS — F1410 Cocaine abuse, uncomplicated: Secondary | ICD-10-CM | POA: Diagnosis present

## 2020-03-20 DIAGNOSIS — F17210 Nicotine dependence, cigarettes, uncomplicated: Secondary | ICD-10-CM | POA: Diagnosis present

## 2020-03-20 DIAGNOSIS — Z79899 Other long term (current) drug therapy: Secondary | ICD-10-CM

## 2020-03-20 DIAGNOSIS — Z91010 Allergy to peanuts: Secondary | ICD-10-CM

## 2020-03-20 DIAGNOSIS — F1910 Other psychoactive substance abuse, uncomplicated: Secondary | ICD-10-CM

## 2020-03-20 DIAGNOSIS — K7460 Unspecified cirrhosis of liver: Secondary | ICD-10-CM | POA: Diagnosis present

## 2020-03-20 DIAGNOSIS — R8271 Bacteriuria: Secondary | ICD-10-CM

## 2020-03-20 DIAGNOSIS — Z20822 Contact with and (suspected) exposure to covid-19: Secondary | ICD-10-CM | POA: Diagnosis present

## 2020-03-20 DIAGNOSIS — J209 Acute bronchitis, unspecified: Secondary | ICD-10-CM | POA: Diagnosis present

## 2020-03-20 DIAGNOSIS — G8191 Hemiplegia, unspecified affecting right dominant side: Secondary | ICD-10-CM | POA: Diagnosis present

## 2020-03-20 DIAGNOSIS — I161 Hypertensive emergency: Secondary | ICD-10-CM | POA: Diagnosis present

## 2020-03-20 DIAGNOSIS — Z8249 Family history of ischemic heart disease and other diseases of the circulatory system: Secondary | ICD-10-CM

## 2020-03-20 DIAGNOSIS — Z87892 Personal history of anaphylaxis: Secondary | ICD-10-CM

## 2020-03-20 DIAGNOSIS — F40240 Claustrophobia: Secondary | ICD-10-CM | POA: Diagnosis present

## 2020-03-20 DIAGNOSIS — I739 Peripheral vascular disease, unspecified: Secondary | ICD-10-CM | POA: Diagnosis present

## 2020-03-20 DIAGNOSIS — R200 Anesthesia of skin: Secondary | ICD-10-CM | POA: Diagnosis present

## 2020-03-20 DIAGNOSIS — Y92009 Unspecified place in unspecified non-institutional (private) residence as the place of occurrence of the external cause: Secondary | ICD-10-CM

## 2020-03-20 DIAGNOSIS — T465X6A Underdosing of other antihypertensive drugs, initial encounter: Secondary | ICD-10-CM | POA: Diagnosis present

## 2020-03-20 DIAGNOSIS — R29701 NIHSS score 1: Secondary | ICD-10-CM | POA: Diagnosis present

## 2020-03-20 LAB — CBC
HCT: 40.2 % (ref 36.0–46.0)
HCT: 41.8 % (ref 36.0–46.0)
Hemoglobin: 12.3 g/dL (ref 12.0–15.0)
Hemoglobin: 12.9 g/dL (ref 12.0–15.0)
MCH: 27.1 pg (ref 26.0–34.0)
MCH: 27.2 pg (ref 26.0–34.0)
MCHC: 30.6 g/dL (ref 30.0–36.0)
MCHC: 30.9 g/dL (ref 30.0–36.0)
MCV: 87.8 fL (ref 80.0–100.0)
MCV: 88.7 fL (ref 80.0–100.0)
Platelets: 267 10*3/uL (ref 150–400)
Platelets: 275 10*3/uL (ref 150–400)
RBC: 4.53 MIL/uL (ref 3.87–5.11)
RBC: 4.76 MIL/uL (ref 3.87–5.11)
RDW: 18.7 % — ABNORMAL HIGH (ref 11.5–15.5)
RDW: 18.8 % — ABNORMAL HIGH (ref 11.5–15.5)
WBC: 7.6 10*3/uL (ref 4.0–10.5)
WBC: 7.7 10*3/uL (ref 4.0–10.5)
nRBC: 0 % (ref 0.0–0.2)
nRBC: 0 % (ref 0.0–0.2)

## 2020-03-20 LAB — URINALYSIS, ROUTINE W REFLEX MICROSCOPIC
Bilirubin Urine: NEGATIVE
Glucose, UA: NEGATIVE mg/dL
Hgb urine dipstick: NEGATIVE
Ketones, ur: NEGATIVE mg/dL
Nitrite: NEGATIVE
Protein, ur: 100 mg/dL — AB
Specific Gravity, Urine: 1.029 (ref 1.005–1.030)
WBC, UA: >50 WBC/hpf — ABNORMAL HIGH (ref 0–5)
pH: 5 (ref 5.0–8.0)

## 2020-03-20 LAB — DIFFERENTIAL
Abs Immature Granulocytes: 0.02 10*3/uL (ref 0.00–0.07)
Basophils Absolute: 0.1 10*3/uL (ref 0.0–0.1)
Basophils Relative: 1 %
Eosinophils Absolute: 0.3 10*3/uL (ref 0.0–0.5)
Eosinophils Relative: 4 %
Immature Granulocytes: 0 %
Lymphocytes Relative: 29 %
Lymphs Abs: 2.3 10*3/uL (ref 0.7–4.0)
Monocytes Absolute: 0.6 10*3/uL (ref 0.1–1.0)
Monocytes Relative: 7 %
Neutro Abs: 4.5 10*3/uL (ref 1.7–7.7)
Neutrophils Relative %: 59 %

## 2020-03-20 LAB — COMPREHENSIVE METABOLIC PANEL
ALT: 39 U/L (ref 0–44)
AST: 43 U/L — ABNORMAL HIGH (ref 15–41)
Albumin: 4 g/dL (ref 3.5–5.0)
Alkaline Phosphatase: 100 U/L (ref 38–126)
Anion gap: 12 (ref 5–15)
BUN: 9 mg/dL (ref 6–20)
CO2: 25 mmol/L (ref 22–32)
Calcium: 9.5 mg/dL (ref 8.9–10.3)
Chloride: 101 mmol/L (ref 98–111)
Creatinine, Ser: 0.63 mg/dL (ref 0.44–1.00)
GFR calc Af Amer: >60 mL/min (ref >60)
GFR calc non Af Amer: >60 mL/min (ref >60)
Glucose, Bld: 95 mg/dL (ref 70–99)
Potassium: 3.6 mmol/L (ref 3.5–5.1)
Sodium: 138 mmol/L (ref 135–145)
Total Bilirubin: 0.4 mg/dL (ref 0.3–1.2)
Total Protein: 8.3 g/dL — ABNORMAL HIGH (ref 6.5–8.1)

## 2020-03-20 LAB — RAPID URINE DRUG SCREEN, HOSP PERFORMED
Amphetamines: NOT DETECTED
Barbiturates: NOT DETECTED
Benzodiazepines: NOT DETECTED
Cocaine: POSITIVE — AB
Opiates: POSITIVE — AB
Tetrahydrocannabinol: NOT DETECTED

## 2020-03-20 LAB — I-STAT BETA HCG BLOOD, ED (MC, WL, AP ONLY): I-stat hCG, quantitative: <5 m[IU]/mL (ref <5)

## 2020-03-20 LAB — ETHANOL: Alcohol, Ethyl (B): 14 mg/dL — ABNORMAL HIGH (ref <10)

## 2020-03-20 MED ORDER — DIAZEPAM 5 MG/ML IJ SOLN
5.0000 mg | Freq: Once | INTRAMUSCULAR | Status: AC
  Administered 2020-03-20: 5 mg via INTRAVENOUS
  Filled 2020-03-20: qty 2

## 2020-03-20 MED ORDER — LORAZEPAM 1 MG PO TABS
1.0000 mg | ORAL_TABLET | Freq: Once | ORAL | Status: DC
  Filled 2020-03-20: qty 1

## 2020-03-20 MED ORDER — ONDANSETRON HCL 4 MG/2ML IJ SOLN
4.0000 mg | Freq: Once | INTRAMUSCULAR | Status: AC
  Administered 2020-03-20: 4 mg via INTRAVENOUS
  Filled 2020-03-20: qty 2

## 2020-03-20 MED ORDER — CLONIDINE HCL 0.1 MG PO TABS
0.1000 mg | ORAL_TABLET | Freq: Once | ORAL | Status: AC
  Administered 2020-03-20: 0.1 mg via ORAL
  Filled 2020-03-20: qty 1

## 2020-03-20 MED ORDER — DIAZEPAM 5 MG PO TABS
5.0000 mg | ORAL_TABLET | Freq: Once | ORAL | Status: AC
  Administered 2020-03-20: 5 mg via ORAL
  Filled 2020-03-20: qty 1

## 2020-03-20 NOTE — ED Provider Notes (Addendum)
Care assumed from [facility].  Please see her full H&P.  In short,  [name] is a 34 y.o. female presents for c/o alternating numbness with gait disturbance onset 1 week ago.  CT performed by initial provider noted lacunar infarct of indeterminate age.  MRI pending.   Physical Exam  BP (!) 190/123   Pulse 105   Temp 98 F (36.7 C) (Oral)   Resp 18   Ht 5' (1.524 m)   Wt 68.9 kg   SpO2 98%   BMI 29.69 kg/m   Physical Exam Vitals and nursing note reviewed.  Constitutional:      General: She is not in acute distress.    Appearance: She is well-developed.  HENT:     Head: Normocephalic.  Eyes:     General: No scleral icterus.    Conjunctiva/sclera: Conjunctivae normal.  Cardiovascular:     Rate and Rhythm: Normal rate.  Pulmonary:     Effort: Pulmonary effort is normal.  Musculoskeletal:        General: Normal range of motion.     Cervical back: Normal range of motion.  Skin:    General: Skin is warm and dry.  Neurological:     Mental Status: She is alert.  Psychiatric:        Mood and Affect: Mood is anxious. Affect is tearful.       ED Course/Procedures   Clinical Course as of Mar 21 25  Wed Mar 20, 2020  2244 Noted.  Pt given valium by initial provider as she appears intoxicated with cocaine.  Will recheck.    BP(!): 190/123 [HM]  2247 Plan: MRI pending.  Dispo per MRI findings.   [HM]    Clinical Course User Index [HM] Adela Esteban, Boyd Kerbs   Results for orders placed or performed during the hospital encounter of 03/20/20  CBC  Result Value Ref Range   WBC 7.6 4.0 - 10.5 K/uL   RBC 4.53 3.87 - 5.11 MIL/uL   Hemoglobin 12.3 12.0 - 15.0 g/dL   HCT 94.7 36 - 46 %   MCV 88.7 80.0 - 100.0 fL   MCH 27.2 26.0 - 34.0 pg   MCHC 30.6 30.0 - 36.0 g/dL   RDW 65.4 (H) 65.0 - 35.4 %   Platelets 275 150 - 400 K/uL   nRBC 0.0 0.0 - 0.2 %  Urinalysis, Routine w reflex microscopic  Result Value Ref Range   Color, Urine YELLOW YELLOW   APPearance  CLOUDY (A) CLEAR   Specific Gravity, Urine 1.029 1.005 - 1.030   pH 5.0 5.0 - 8.0   Glucose, UA NEGATIVE NEGATIVE mg/dL   Hgb urine dipstick NEGATIVE NEGATIVE   Bilirubin Urine NEGATIVE NEGATIVE   Ketones, ur NEGATIVE NEGATIVE mg/dL   Protein, ur 656 (A) NEGATIVE mg/dL   Nitrite NEGATIVE NEGATIVE   Leukocytes,Ua SMALL (A) NEGATIVE   RBC / HPF 6-10 0 - 5 RBC/hpf   WBC, UA >50 (H) 0 - 5 WBC/hpf   Bacteria, UA FEW (A) NONE SEEN   Squamous Epithelial / LPF 21-50 0 - 5   Mucus PRESENT    Hyaline Casts, UA PRESENT    Non Squamous Epithelial 0-5 (A) NONE SEEN  Rapid urine drug screen (hospital performed)  Result Value Ref Range   Opiates POSITIVE (A) NONE DETECTED   Cocaine POSITIVE (A) NONE DETECTED   Benzodiazepines NONE DETECTED NONE DETECTED   Amphetamines NONE DETECTED NONE DETECTED   Tetrahydrocannabinol NONE DETECTED NONE DETECTED  Barbiturates NONE DETECTED NONE DETECTED  Ethanol  Result Value Ref Range   Alcohol, Ethyl (B) 14 (H) <10 mg/dL  Comprehensive metabolic panel  Result Value Ref Range   Sodium 138 135 - 145 mmol/L   Potassium 3.6 3.5 - 5.1 mmol/L   Chloride 101 98 - 111 mmol/L   CO2 25 22 - 32 mmol/L   Glucose, Bld 95 70 - 99 mg/dL   BUN 9 6 - 20 mg/dL   Creatinine, Ser 8.34 0.44 - 1.00 mg/dL   Calcium 9.5 8.9 - 19.6 mg/dL   Total Protein 8.3 (H) 6.5 - 8.1 g/dL   Albumin 4.0 3.5 - 5.0 g/dL   AST 43 (H) 15 - 41 U/L   ALT 39 0 - 44 U/L   Alkaline Phosphatase 100 38 - 126 U/L   Total Bilirubin 0.4 0.3 - 1.2 mg/dL   GFR calc non Af Amer >60 >60 mL/min   GFR calc Af Amer >60 >60 mL/min   Anion gap 12 5 - 15  Protime-INR  Result Value Ref Range   Prothrombin Time 13.6 11.4 - 15.2 seconds   INR 1.1 0.8 - 1.2  APTT  Result Value Ref Range   aPTT 31 24 - 36 seconds  Differential  Result Value Ref Range   Neutrophils Relative % 59 %   Neutro Abs 4.5 1.7 - 7.7 K/uL   Lymphocytes Relative 29 %   Lymphs Abs 2.3 0.7 - 4.0 K/uL   Monocytes Relative 7 %    Monocytes Absolute 0.6 0 - 1 K/uL   Eosinophils Relative 4 %   Eosinophils Absolute 0.3 0 - 0 K/uL   Basophils Relative 1 %   Basophils Absolute 0.1 0 - 0 K/uL   Immature Granulocytes 0 %   Abs Immature Granulocytes 0.02 0.00 - 0.07 K/uL  CBC  Result Value Ref Range   WBC 7.7 4.0 - 10.5 K/uL   RBC 4.76 3.87 - 5.11 MIL/uL   Hemoglobin 12.9 12.0 - 15.0 g/dL   HCT 22.2 36 - 46 %   MCV 87.8 80.0 - 100.0 fL   MCH 27.1 26.0 - 34.0 pg   MCHC 30.9 30.0 - 36.0 g/dL   RDW 97.9 (H) 89.2 - 11.9 %   Platelets 267 150 - 400 K/uL   nRBC 0.0 0.0 - 0.2 %  I-Stat beta hCG blood, ED  Result Value Ref Range   I-stat hCG, quantitative <5.0 <5 mIU/mL   Comment 3           CT Head Wo Contrast  Result Date: 03/20/2020 CLINICAL DATA:  34 year old female with altered mental status. EXAM: CT HEAD WITHOUT CONTRAST TECHNIQUE: Contiguous axial images were obtained from the base of the skull through the vertex without intravenous contrast. COMPARISON:  None. FINDINGS: Brain: The ventricles and sulci appropriate size for patient's age. Faint subcentimeter hypodense focus in the left periventricular white matter (18/2) as well as an additional focus in the left thalamus (15/2) consistent with age indeterminate lacunar infarcts, new since the prior CT. Acute infarct is not excluded clinical correlation recommended. MRI may provide better evaluation. There is no acute intracranial hemorrhage. No mass effect or midline shift. No extra-axial fluid collection. Vascular: No hyperdense vessel or unexpected calcification. Skull: Normal. Negative for fracture or focal lesion. Sinuses/Orbits: No acute finding. Other: None IMPRESSION: 1. No acute intracranial hemorrhage. 2. Age indeterminate left thalamic and left periventricular lacunar infarcts. Acute infarct is not excluded. MRI may provide better evaluation. These results  were called by telephone at the time of interpretation on 03/20/2020 at 9:36 pm to provider [facility] , who  verbally acknowledged these results. Electronically Signed   By: Elgie Collard M.D.   On: 03/20/2020 21:37   MR BRAIN WO CONTRAST  Result Date: 03/20/2020 CLINICAL DATA:  34 year old female with altered mental status, neurologic deficits. Abnormal plain head CT tonight. Polysubstance abuse, cirrhosis. EXAM: MRI HEAD WITHOUT CONTRAST TECHNIQUE: Multiplanar, multiecho pulse sequences of the brain and surrounding structures were obtained without intravenous contrast. COMPARISON:  Head CT 2122 hours.  Prior head CT 05/01/2017. FINDINGS: Brain: Several foci of abnormal diffusion, corresponding to the areas of hypodensity on the earlier CT. The largest tracks from the posterior left corona radiata toward the posterior limb of the left internal capsule/lentiform (series 5, image 85). And there are bilateral thalamic foci of restricted diffusion, the larger on the left is 6 mm. On ADC these are isointense to restricted. T2 and FLAIR hyperintensity in each of these areas with no hemorrhage or mass effect. No other restricted diffusion. No superimposed chronic encephalomalacia identified. No chronic cerebral blood products. The brainstem and cerebellum appear within normal limits. No midline shift, mass effect, evidence of mass lesion, ventriculomegaly, extra-axial collection or acute intracranial hemorrhage. Cervicomedullary junction and pituitary are within normal limits. Vascular: Major intracranial vascular flow voids are preserved. Skull and upper cervical spine: Negative visible cervical spine. Nonspecific decreased T1 signal in the visible skeleton, with no destructive osseous lesion identified. Sinuses/Orbits: Negative orbits. Paranasal sinuses and mastoids are stable and well pneumatized. Other: Scalp and face soft tissues appear IMPRESSION: 1. Three acute to early subacute lacunar infarcts corresponding to the CT abnormalities: Left corona radiata/internal capsule, and bilateral thalami. No associated  hemorrhage or mass effect. Elsewhere the brain parenchyma appears normal. 2. Decreased but nonspecific marrow signal in the visible skeleton. Electronically Signed   By: Odessa Fleming M.D.   On: 03/20/2020 22:45    .Critical Care Performed by: Dierdre Forth, PA-C Authorized by: Dierdre Forth, PA-C   Critical care provider statement:    Critical care time (minutes):  45   Critical care time was exclusive of:  Separately billable procedures and treating other patients and teaching time   Critical care was necessary to treat or prevent imminent or life-threatening deterioration of the following conditions:  CNS failure or compromise   Critical care was time spent personally by me on the following activities:  Discussions with consultants, evaluation of patient's response to treatment, examination of patient, ordering and performing treatments and interventions, ordering and review of laboratory studies, ordering and review of radiographic studies, pulse oximetry, re-evaluation of patient's condition, obtaining history from patient or surrogate and review of old charts    MDM   Patient presents with alternating numbness.  History of polysubstance abuse.  Reports her last cocaine usage was 5 days ago.  Has a history of hypertension and has been taking her medications recently.  At signout, MRI pending for abnormal CT scan.  11:42 PM MRI confirms 3 acute to early subacute lacunar infarcts corresponding to the CT abnormalities in the left corona and bilateral thalami.  Code stroke order set utilized.  Discussed with patient who does agree to admission.  Will discuss with neurology.  Noted to be hypertensive.  She is very anxious.  IV Valium ordered along with home clonidine.  BP (!) 181/101   Pulse 94   Temp 98 F (36.7 C) (Oral)   Resp 18   Ht 5' (  1.524 m)   Wt 68.9 kg   SpO2 98%   BMI 29.69 kg/m   The patient was discussed with and seen by Dr. Particia Nearing who agrees with the  treatment plan.  12:23 AM Discussed with Dr. Loney Loh who will admit.  Discussed with Dr. Wilford Corner who recommends admission to [facility].  He will discuss further treatment plans with Dr. Loney Loh directly.     Lacunar infarct, acute (HCC)  Polysubstance abuse (HCC)  Hypertension, unspecified type       Milta Deiters 03/21/20 0026    Jacalyn Lefevre, MD 03/21/20 [phone]

## 2020-03-20 NOTE — ED Provider Notes (Signed)
[facility]-EMERGENCY DEPT Provider Note   CSN: [id number] Arrival date & time: 03/20/20  2023     History Chief Complaint  Patient presents with  . Insect Bite    [name] is a 34 y.o. female.  HPI 34 year old female with hypertension, cocaine use, polysubstance abuse, liver cirrhosis presents to the ER with complaints of numbness on different sides of her body.  Patient states that she has was bitten by a earwig 1 week ago, and since then has been having right-sided numbness which then passes, however then states that she will have left-sided numbness.  She states that for the past week she feels like she has been walking funny.  Patient on presentation is speaking very fast, jumbling words together.  Having difficulty keeping her train of thought.  She states that she is having a panic attack right now because of the numbness.  She states that she had some alcohol 2 days ago, but denies any drug use.  She denies any chest pain or shortness of breath, nausea, vomiting, vision changes.  No fevers or chills.  Denies any SI/HI.    Past Medical History:  Diagnosis Date  . Anxiety   . Cirrhosis of liver (HCC)   . Cocaine use 08/04/2018  . History of cocaine use 08/04/2018  . Hypertension   . Polysubstance abuse (HCC)   . Renal disorder    Kidney Infection     Patient Active Problem List   Diagnosis Date Noted  . Acute respiratory failure with hypoxia (HCC) 08/01/2017  . Polysubstance abuse (HCC) 08/01/2017  . Constipation 08/01/2017  . Anemia 08/01/2017  . Rectal bleeding 08/01/2017  . Alcohol withdrawal (HCC) 07/31/2017  . Ventral hernia 10/04/2016  . Incarcerated ventral hernia 10/03/2016  . Anxiety 02/15/2014  . Hypertension 02/15/2014    Past Surgical History:  Procedure Laterality Date  . ARTERY REPAIR    . CESAREAN SECTION     x 5  . MOUTH SURGERY    . TEAR DUCT PROBING    . TUBAL LIGATION    . VENTRAL HERNIA REPAIR N/A 10/03/2016     Procedure: OPEN INCARCERATED HERNIA REPAIR VENTRAL ADULT;  Surgeon: Axel Filler, MD;  Location: [facility] OR;  Service: General;  Laterality: N/A;     OB History   No obstetric history on file.     Family History  Problem Relation Age of Onset  . CAD Other   . Diabetes Other   . Hypertension Other   . Cancer Other   . Thyroid disease Other     Social History   Tobacco Use  . Smoking status: Current Every Day Smoker    Packs/day: 0.50    Types: Cigarettes  . Smokeless tobacco: Never Used  Vaping Use  . Vaping Use: Never used  Substance Use Topics  . Alcohol use: Yes    Comment: 4 0r 5 fourty oz per day  . Drug use: Yes    Frequency: 2.0 times per week    Types: Cocaine    Comment: Last time used cocaine was couple months ago, today is 08/04/2018    Home Medications Prior to Admission medications   Medication Sig Start Date End Date Taking? Authorizing Provider  albuterol (PROVENTIL HFA;VENTOLIN HFA) 108 (90 Base) MCG/ACT inhaler Inhale 1-2 puffs into the lungs every 6 (six) hours as needed for wheezing or shortness of breath. 09/19/17   Leaphart, Lynann Beaver, PA-C  chlordiazePOXIDE (LIBRIUM) 25 MG capsule Take 1 capsule (25  mg total) by mouth 3 (three) times daily as needed for withdrawal. 08/04/18   Felicie Morn, NP  cloNIDine (CATAPRES) 0.1 MG tablet Take 1 tablet (0.1 mg total) by mouth 2 (two) times daily. Patient not taking: Reported on 08/04/2018 05/01/17   Elpidio Anis, PA-C  cloNIDine (CATAPRES) 0.1 MG tablet Take 1 tablet (0.1 mg total) by mouth daily. 08/04/18   Felicie Morn, NP  diazepam (VALIUM) 5 MG tablet Take 0.5-1 tablets (2.5-5 mg total) by mouth every 6 (six) hours as needed for anxiety. Patient not taking: Reported on 09/19/2017 08/05/17   Alison Murray, MD  folic acid (FOLVITE) 1 MG tablet Take 1 tablet (1 mg total) by mouth daily. Patient not taking: Reported on 09/19/2017 08/05/17   Alison Murray, MD  methocarbamol (ROBAXIN) 500 MG tablet Take 1 tablet  (500 mg total) by mouth 2 (two) times daily. Patient not taking: Reported on 08/04/2018 09/19/17   Demetrios Loll T, PA-C  metoCLOPramide (REGLAN) 10 MG tablet Take 1 tablet (10 mg total) by mouth every 8 (eight) hours as needed for nausea. 08/04/18   Felicie Morn, NP  Multiple Vitamin (MULTIVITAMIN WITH MINERALS) TABS tablet Take 1 tablet by mouth daily. Patient not taking: Reported on 09/19/2017 08/05/17   Alison Murray, MD  omeprazole (PRILOSEC) 40 MG capsule Take 1 capsule (40 mg total) by mouth daily. 09/03/17   Ward, Layla Maw, DO  ondansetron (ZOFRAN ODT) 4 MG disintegrating tablet Take 1 tablet (4 mg total) by mouth every 8 (eight) hours as needed for nausea or vomiting. 09/03/17   Ward, Layla Maw, DO  predniSONE (DELTASONE) 20 MG tablet Take 3 tablets (60 mg total) by mouth daily. Patient not taking: Reported on 09/19/2017 09/03/17   Ward, Layla Maw, DO  propranolol (INDERAL) 40 MG tablet Take 1 tablet (40 mg total) by mouth 2 (two) times daily. Patient not taking: Reported on 08/04/2018 05/01/17   Elpidio Anis, PA-C  thiamine (VITAMIN B-1) 100 MG tablet Take 1 tablet (100 mg total) by mouth daily. Patient not taking: Reported on 09/19/2017 09/03/17   Ward, Layla Maw, DO    Allergies    Ibuprofen, Peanut-containing drug products, Penicillins, Acetaminophen, Codeine, Lisinopril, and Ativan [lorazepam]  Review of Systems   Review of Systems  Constitutional: Negative for chills and fever.  HENT: Negative for ear pain and sore throat.   Eyes: Negative for pain and visual disturbance.  Respiratory: Negative for cough and shortness of breath.   Cardiovascular: Negative for chest pain and palpitations.  Gastrointestinal: Negative for abdominal pain and vomiting.  Genitourinary: Negative for dysuria and hematuria.  Musculoskeletal: Negative for arthralgias and back pain.  Skin: Negative for color change and rash.  Neurological: Positive for weakness and numbness. Negative for seizures and  syncope.  Psychiatric/Behavioral: Positive for agitation. The patient is nervous/anxious.   All other systems reviewed and are negative.   Physical Exam Updated Vital Signs BP (!) 190/123   Pulse 105   Temp 98 F (36.7 C) (Oral)   Resp 18   Ht 5' (1.524 m)   Wt 68.9 kg   SpO2 98%   BMI 29.69 kg/m   Physical Exam Vitals and nursing note reviewed.  Constitutional:      General: She is not in acute distress.    Appearance: Normal appearance. She is well-developed. She is not ill-appearing or diaphoretic.  HENT:     Head: Normocephalic and atraumatic.     Mouth/Throat:     Mouth: Mucous membranes  are moist.     Pharynx: Oropharynx is clear.  Eyes:     Conjunctiva/sclera: Conjunctivae normal.  Cardiovascular:     Rate and Rhythm: Normal rate and regular rhythm.     Heart sounds: No murmur heard.   Pulmonary:     Effort: Pulmonary effort is normal. No respiratory distress.     Breath sounds: Normal breath sounds.  Abdominal:     General: Abdomen is flat.     Palpations: Abdomen is soft.     Tenderness: There is no abdominal tenderness.  Musculoskeletal:        General: No tenderness.     Cervical back: Neck supple.     Right lower leg: No edema.     Left lower leg: No edema.  Skin:    General: Skin is warm and dry.     Capillary Refill: Capillary refill takes less than 2 seconds.     Findings: No erythema.  Neurological:     Mental Status: She is alert and oriented to person, place, and time.     Cranial Nerves: No cranial nerve deficit.     Sensory: No sensory deficit.     Motor: Weakness present.     Coordination: Coordination normal.     Gait: Gait normal.     Comments: Mental Status:  Alert, thought content appropriate, able to give a coherent history.  Speech pressured.  Able to follow 2 step commands without difficulty.  Cranial Nerves:  II: Peripheral visual fields grossly normal, pupils equal, round, reactive to light III,IV, VI: ptosis not present,  extra-ocular motions intact bilaterally  V,VII: smile symmetric, facial light touch sensation equal VIII: hearing grossly normal to voice  X: uvula elevates symmetrically  XI: bilateral shoulder shrug symmetric and strong XII: midline tongue extension without fassiculations Motor:  Normal tone.  4/5, 5/5 left grip strength.  Gross sensations intact in both upper and lower extremities.  5/5 dorsiflexion/plantar flexion Sensory: light touch normal in all extremities. Cerebellar: normal finger-to-nose with bilateral upper extremities, Romberg sign absent Gait: normal gait and balance. Able to walk on toes and heels with ease.    Psychiatric:        Mood and Affect: Mood is anxious.        Speech: Speech is rapid and pressured.        Behavior: Behavior is hyperactive.        Thought Content: Thought content is not paranoid or delusional. Thought content does not include homicidal or suicidal ideation. Thought content does not include homicidal or suicidal plan.     ED Results / Procedures / Treatments   Labs (all labs ordered are listed, but only abnormal results are displayed) Labs Reviewed  CBC - Abnormal; Notable for the following components:      Result Value   RDW 18.8 (*)    All other components within normal limits  URINALYSIS, ROUTINE W REFLEX MICROSCOPIC - Abnormal; Notable for the following components:   APPearance CLOUDY (*)    Protein, ur 100 (*)    Leukocytes,Ua SMALL (*)    WBC, UA >50 (*)    Bacteria, UA FEW (*)    Non Squamous Epithelial 0-5 (*)    All other components within normal limits  RAPID URINE DRUG SCREEN, HOSP PERFORMED - Abnormal; Notable for the following components:   Opiates POSITIVE (*)    Cocaine POSITIVE (*)    All other components within normal limits  ETHANOL - Abnormal; Notable for the following  components:   Alcohol, Ethyl (B) 14 (*)    All other components within normal limits  COMPREHENSIVE METABOLIC PANEL - Abnormal; Notable for the  following components:   Total Protein 8.3 (*)    AST 43 (*)    All other components within normal limits  URINE CULTURE    EKG None  Radiology CT Head Wo Contrast  Result Date: 03/20/2020 CLINICAL DATA:  34 year old female with altered mental status. EXAM: CT HEAD WITHOUT CONTRAST TECHNIQUE: Contiguous axial images were obtained from the base of the skull through the vertex without intravenous contrast. COMPARISON:  None. FINDINGS: Brain: The ventricles and sulci appropriate size for patient's age. Faint subcentimeter hypodense focus in the left periventricular white matter (18/2) as well as an additional focus in the left thalamus (15/2) consistent with age indeterminate lacunar infarcts, new since the prior CT. Acute infarct is not excluded clinical correlation recommended. MRI may provide better evaluation. There is no acute intracranial hemorrhage. No mass effect or midline shift. No extra-axial fluid collection. Vascular: No hyperdense vessel or unexpected calcification. Skull: Normal. Negative for fracture or focal lesion. Sinuses/Orbits: No acute finding. Other: None IMPRESSION: 1. No acute intracranial hemorrhage. 2. Age indeterminate left thalamic and left periventricular lacunar infarcts. Acute infarct is not excluded. MRI may provide better evaluation. These results were called by telephone at the time of interpretation on 03/20/2020 at 9:36 pm to provider [facility] , who verbally acknowledged these results. Electronically Signed   By: Elgie Collard M.D.   On: 03/20/2020 21:37    Procedures Procedures (including critical care time)  Medications Ordered in ED Medications  LORazepam (ATIVAN) tablet 1 mg (1 mg Oral Refused 03/20/20 2153)  diazepam (VALIUM) tablet 5 mg (5 mg Oral Given 03/20/20 2209)    ED Course  I have reviewed the triage vital signs and the nursing notes.  Pertinent labs & imaging results that were available during my care of the patient were reviewed by me  and considered in my medical decision making (see chart for details).  Clinical Course as of Mar 20 2244  Wed Mar 20, 2020  2244 Noted.  Pt given valium by initial provider as she appears intoxicated with cocaine.  Will recheck.    BP(!): 190/123 [HM]    Clinical Course User Index [HM] Muthersbaugh, Boyd Kerbs   MDM Rules/Calculators/A&P                         34 year old female with history of polysubstance abuse presents to the ER with intermittent right-sided and left-sided weakness which changes On presentation, the patient is alert, oriented, however her speech is rapid and pressured, she appears to have difficulty staying on topic.  She is repeats the same thing over and over.  She is very anxious appearing, states that she is having a panic attack because she is freaking out about her numbness.  No focal neuro deficits noted, however her right grip strength was slightly less than her left.  Gross sensations intact.  She was able to ambulate in the ED without difficulty.  She arrived with her blood pressure at 190/123, denies any chest pain, headaches, vision changes. Pt given valium for anxiety and attempt to bring down BP.  This is likely due to the cocaine that is in her system prior her UDS.  UDS also positive for opiates.  Ethanol level of 14.  CMP without any electrolyte abnormalities, normal renal function.  AST mildly elevated at  43, normal ALT.  CBC without leukocytosis.  UA  Small leukocytes and few WBC's, however patient does not have urinary complaints at this time. Will send for culture.  Most notably, received a call from the radiologist that the patient has a indeterminate age left thalamic and left periventricular lacunar infarct.  Acute infarct not excluded.  Patient is outside of the TPA window, however will order MRI for further evaluation.  Care signed out to Muthersbaugh PA-C who will oversee the MRI results and will dispo accordingly.  Final Clinical Impression(s) / ED  Diagnoses Final diagnoses:  None    Rx / DC Orders ED Discharge Orders    None       Leone Brand 03/20/20 2245    Jacalyn Lefevre, MD 03/21/20 1506

## 2020-03-20 NOTE — ED Triage Notes (Signed)
Patient states she was possibly bitten by an [place] on her right hand over a week ago. Patient states initially she had numbness and pain on right side of body, then shifted to left side of body, went away, patient vomited, and then pain returned to right side of body. Patient states she is currently having panic issues and appears manic. This RN having difficulty understanding patient and what is currently hurting.

## 2020-03-20 NOTE — ED Notes (Signed)
Pt to MRI

## 2020-03-20 NOTE — ED Notes (Signed)
Pt crying and scared, she said she didn't know the MRI was going to be so frightening, she states that she needs something to eat and something for her blood pressure

## 2020-03-21 ENCOUNTER — Inpatient Hospital Stay (HOSPITAL_COMMUNITY): Payer: Self-pay

## 2020-03-21 ENCOUNTER — Encounter (HOSPITAL_COMMUNITY): Payer: Self-pay | Admitting: Internal Medicine

## 2020-03-21 ENCOUNTER — Other Ambulatory Visit: Payer: Self-pay

## 2020-03-21 DIAGNOSIS — I160 Hypertensive urgency: Secondary | ICD-10-CM

## 2020-03-21 DIAGNOSIS — R8271 Bacteriuria: Secondary | ICD-10-CM

## 2020-03-21 DIAGNOSIS — J40 Bronchitis, not specified as acute or chronic: Secondary | ICD-10-CM

## 2020-03-21 DIAGNOSIS — I639 Cerebral infarction, unspecified: Secondary | ICD-10-CM | POA: Diagnosis present

## 2020-03-21 DIAGNOSIS — I10 Essential (primary) hypertension: Secondary | ICD-10-CM

## 2020-03-21 DIAGNOSIS — I6389 Other cerebral infarction: Secondary | ICD-10-CM

## 2020-03-21 LAB — LIPID PANEL
Cholesterol: 143 mg/dL (ref 0–200)
HDL: 35 mg/dL — ABNORMAL LOW (ref >40)
LDL Cholesterol: 81 mg/dL (ref 0–99)
Total CHOL/HDL Ratio: 4.1 RATIO
Triglycerides: 137 mg/dL (ref <150)
VLDL: 27 mg/dL (ref 0–40)

## 2020-03-21 LAB — ECHOCARDIOGRAM COMPLETE
Area-P 1/2: 3.53 cm2
Height: 60 in
S' Lateral: 2.53 cm
Weight: 2432 oz

## 2020-03-21 LAB — MAGNESIUM: Magnesium: 1.8 mg/dL (ref 1.7–2.4)

## 2020-03-21 LAB — HEMOGLOBIN A1C
Hgb A1c MFr Bld: 5.6 % (ref 4.8–5.6)
Mean Plasma Glucose: 114.02 mg/dL

## 2020-03-21 LAB — APTT: aPTT: 31 seconds (ref 24–36)

## 2020-03-21 LAB — HIV ANTIBODY (ROUTINE TESTING W REFLEX): HIV Screen 4th Generation wRfx: NONREACTIVE

## 2020-03-21 LAB — SARS CORONAVIRUS 2 BY RT PCR (HOSPITAL ORDER, PERFORMED IN ~~LOC~~ HOSPITAL LAB): SARS Coronavirus 2: NEGATIVE

## 2020-03-21 LAB — PROTIME-INR
INR: 1.1 (ref 0.8–1.2)
Prothrombin Time: 13.6 seconds (ref 11.4–15.2)

## 2020-03-21 LAB — PHOSPHORUS: Phosphorus: 4.1 mg/dL (ref 2.5–4.6)

## 2020-03-21 MED ORDER — ENOXAPARIN SODIUM 40 MG/0.4ML ~~LOC~~ SOLN
40.0000 mg | SUBCUTANEOUS | Status: DC
  Administered 2020-03-21 – 2020-03-22 (×2): 40 mg via SUBCUTANEOUS
  Filled 2020-03-21 (×2): qty 0.4

## 2020-03-21 MED ORDER — HYDRALAZINE HCL 20 MG/ML IJ SOLN
5.0000 mg | INTRAMUSCULAR | Status: DC | PRN
  Administered 2020-03-21 – 2020-03-22 (×3): 5 mg via INTRAVENOUS
  Filled 2020-03-21 (×3): qty 1

## 2020-03-21 MED ORDER — THIAMINE HCL 100 MG/ML IJ SOLN
100.0000 mg | Freq: Every day | INTRAMUSCULAR | Status: DC
  Filled 2020-03-21: qty 2

## 2020-03-21 MED ORDER — LORAZEPAM 1 MG PO TABS
1.0000 mg | ORAL_TABLET | ORAL | Status: DC | PRN
  Administered 2020-03-21: 2 mg via ORAL
  Administered 2020-03-21 (×3): 1 mg via ORAL
  Filled 2020-03-21 (×3): qty 1
  Filled 2020-03-21: qty 2

## 2020-03-21 MED ORDER — LIDOCAINE 5 % EX PTCH
1.0000 | MEDICATED_PATCH | CUTANEOUS | Status: DC
  Administered 2020-03-22: 1 via TRANSDERMAL
  Filled 2020-03-21: qty 1

## 2020-03-21 MED ORDER — ATORVASTATIN CALCIUM 40 MG PO TABS
40.0000 mg | ORAL_TABLET | Freq: Every day | ORAL | Status: DC
  Administered 2020-03-21 – 2020-03-22 (×2): 40 mg via ORAL
  Filled 2020-03-21 (×2): qty 1

## 2020-03-21 MED ORDER — IOHEXOL 350 MG/ML SOLN
100.0000 mL | Freq: Once | INTRAVENOUS | Status: AC | PRN
  Administered 2020-03-21: 100 mL via INTRAVENOUS

## 2020-03-21 MED ORDER — FOLIC ACID 1 MG PO TABS
1.0000 mg | ORAL_TABLET | Freq: Every day | ORAL | Status: DC
  Administered 2020-03-21 – 2020-03-22 (×2): 1 mg via ORAL
  Filled 2020-03-21 (×2): qty 1

## 2020-03-21 MED ORDER — ALBUTEROL SULFATE (2.5 MG/3ML) 0.083% IN NEBU
2.5000 mg | INHALATION_SOLUTION | Freq: Four times a day (QID) | RESPIRATORY_TRACT | Status: DC
  Administered 2020-03-21 (×3): 2.5 mg via RESPIRATORY_TRACT
  Filled 2020-03-21 (×4): qty 3

## 2020-03-21 MED ORDER — NICOTINE 14 MG/24HR TD PT24
14.0000 mg | MEDICATED_PATCH | Freq: Every day | TRANSDERMAL | Status: DC
  Administered 2020-03-21 – 2020-03-22 (×2): 14 mg via TRANSDERMAL
  Filled 2020-03-21 (×2): qty 1

## 2020-03-21 MED ORDER — CLOPIDOGREL BISULFATE 75 MG PO TABS
75.0000 mg | ORAL_TABLET | Freq: Every day | ORAL | Status: DC
  Administered 2020-03-21 – 2020-03-22 (×2): 75 mg via ORAL
  Filled 2020-03-21 (×2): qty 1

## 2020-03-21 MED ORDER — ALBUTEROL SULFATE (2.5 MG/3ML) 0.083% IN NEBU: 2.5000 mg | INHALATION_SOLUTION | RESPIRATORY_TRACT | Status: DC | PRN

## 2020-03-21 MED ORDER — SODIUM CHLORIDE (PF) 0.9 % IJ SOLN
INTRAMUSCULAR | Status: AC
  Filled 2020-03-21: qty 50

## 2020-03-21 MED ORDER — AMLODIPINE BESYLATE 5 MG PO TABS
5.0000 mg | ORAL_TABLET | Freq: Every day | ORAL | Status: DC
  Administered 2020-03-21: 5 mg via ORAL
  Filled 2020-03-21: qty 1

## 2020-03-21 MED ORDER — THIAMINE HCL 100 MG PO TABS
100.0000 mg | ORAL_TABLET | Freq: Every day | ORAL | Status: DC
  Administered 2020-03-21 – 2020-03-22 (×2): 100 mg via ORAL
  Filled 2020-03-21 (×2): qty 1

## 2020-03-21 MED ORDER — OXYCODONE HCL 5 MG PO TABS
5.0000 mg | ORAL_TABLET | Freq: Once | ORAL | Status: AC
  Administered 2020-03-21: 5 mg via ORAL
  Filled 2020-03-21: qty 1

## 2020-03-21 MED ORDER — GUAIFENESIN ER 600 MG PO TB12
600.0000 mg | ORAL_TABLET | Freq: Two times a day (BID) | ORAL | Status: DC
  Administered 2020-03-21 – 2020-03-22 (×4): 600 mg via ORAL
  Filled 2020-03-21 (×4): qty 1

## 2020-03-21 MED ORDER — NICOTINE 21 MG/24HR TD PT24
21.0000 mg | MEDICATED_PATCH | Freq: Every day | TRANSDERMAL | Status: DC
  Administered 2020-03-21: 21 mg via TRANSDERMAL
  Filled 2020-03-21: qty 1

## 2020-03-21 MED ORDER — ASPIRIN EC 81 MG PO TBEC
81.0000 mg | DELAYED_RELEASE_TABLET | Freq: Every day | ORAL | Status: DC
  Administered 2020-03-21 – 2020-03-22 (×2): 81 mg via ORAL
  Filled 2020-03-21 (×2): qty 1

## 2020-03-21 MED ORDER — LORAZEPAM 2 MG/ML IJ SOLN
1.0000 mg | INTRAMUSCULAR | Status: DC | PRN
  Administered 2020-03-21 – 2020-03-22 (×2): 2 mg via INTRAVENOUS
  Filled 2020-03-21 (×2): qty 1

## 2020-03-21 MED ORDER — ADULT MULTIVITAMIN W/MINERALS CH
1.0000 | ORAL_TABLET | Freq: Every day | ORAL | Status: DC
  Administered 2020-03-21 – 2020-03-22 (×2): 1 via ORAL
  Filled 2020-03-21 (×2): qty 1

## 2020-03-21 MED ORDER — STROKE: EARLY STAGES OF RECOVERY BOOK
Freq: Once | Status: DC
  Filled 2020-03-21: qty 1

## 2020-03-21 NOTE — Evaluation (Signed)
Occupational Therapy Evaluation Patient Details Name: [name] MRN: [id number] DOB: [birth year] Today's Date: 03/21/2020    History of Present Illness : [name] is a 34 y.o. female with medical history significant of anxiety, polysubstance abuse (cocaine, alcohol, tobacco), liver cirrhosis presenting with complaints of numbness and weakness on different sides of her body.  Patient states a week ago she noticed that the right side of her body was numb and she could not walk.  She later had numbness and weakness in her left arm.  She has had alternating weakness and numbness on both sides of her body this past week.  Also reports having problems with her memory for the past 1 week.  Reports snorting cocaine.  Denies IV drug use.  Reports drinking up to four 40 ounce cans of beer daily.  She was previously on clonidine and propranolol for high blood pressure and stopped taking medications a year ago.  No additional history could be obtained from her.   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 On evaluation patient demonstrates functional ROM, strength and coordination of bilateral upper extremities with reports of numbness/tingling in tips of digits 3-5. In regards to strength patient able to exhibit 5/5 strength in RUE but when tested bilaterally patient unable to maintain  Full resistance in right shoulder. Patient's coordination normal if focusing - did exhibit slight dysmetria to the right with finger to finger test - but when cued to focus - performed well. Patient's FMC functional though slightly decreased compared to normal - patient reports writing is "sloppy" compared to normal and patient exhibiting some difficulty with finger walking pen and performing rapid alternating movement with fingers with pen. However, patient did well with in hand  manipulation with crumbling paper towel and opening food and drink. At this time patient does not require any further OT services as she is able to perform mobility and ADLs without difficulty. Patient educated to pursue OT outpatient services (educated patient she has to obtain order from PCP) if she has difficulty doing normal tasks on return home - difficulty lifting heavier objects or if arm becomes weaker.     Follow Up Recommendations  No OT follow up    Equipment Recommendations  None recommended by OT    Recommendations for Other Services       Precautions / Restrictions Precautions Precautions: None      Mobility Bed Mobility Overal bed mobility: Independent                Transfers Overall transfer level: Independent                    Balance Overall balance assessment:  (see PT note for balance deficits.)                                         ADL either performed or assessed with clinical judgement   ADL Overall ADL's : Independent                                             Vision Baseline Vision/History: No visual deficits Vision Assessment?:  No apparent visual deficits     Perception     Praxis      Pertinent Vitals/Pain Pain Assessment: No/denies pain     Hand Dominance Right   Extremity/Trunk Assessment Upper Extremity Assessment Upper Extremity Assessment: RUE deficits/detail;LUE deficits/detail RUE Deficits / Details: 4/5 shoulder strength when testing shoulder strength bilaterally initially but then 5/5 when tested unilaterally. Otherwise 5/5 strength. RUE Sensation:  (Reports numbness and decreased light touch in fingertips.) RUE Coordination: WNL (Able to perform finger to thumb, finger to nose, and rapid alternativing movement. Need to be cues to focus to improve hitting target.) LUE Deficits / Details: 5/5 ROM and strength. LUE Sensation: WNL LUE Coordination: WNL   Lower Extremity  Assessment Lower Extremity Assessment: Defer to PT evaluation   Cervical / Trunk Assessment Cervical / Trunk Assessment: Normal   Communication Communication Communication: No difficulties   Cognition Arousal/Alertness: Awake/alert Behavior During Therapy: WFL for tasks assessed/performed Overall Cognitive Status: Within Functional Limits for tasks assessed                                 General Comments: Patient awakened from deep sleep. Initially unable to state the day of the week but eventually able to.   General Comments  Functional balance for daily tasks.    Exercises     Shoulder Instructions      Home Living Family/patient expects to be discharged to:: Private residence Living Arrangements: Other relatives Available Help at Discharge: Friend(s) Type of Home: Mobile home Home Access: Stairs to enter Entrance Stairs-Number of Steps: 6   Home Layout: One level     Bathroom Shower/Tub: Chief Strategy Officer: Standard     Home Equipment: None          Prior Functioning/Environment Level of Independence: Independent        Comments: Reports difficulties for last weak with right side but able to perform all tasks including dressing and showering and ambulating up and down steps.        OT Problem List: Decreased strength      OT Treatment/Interventions:      OT Goals(Current goals can be found in the care plan section) Acute Rehab OT Goals OT Goal Formulation: All assessment and education complete, DC therapy  OT Frequency:     Barriers to D/C:            Co-evaluation              AM-PAC OT "6 Clicks" Daily Activity     Outcome Measure Help from another person eating meals?: None Help from another person taking care of personal grooming?: None Help from another person toileting, which includes using toliet, bedpan, or urinal?: None Help from another person bathing (including washing, rinsing, drying)?:  None Help from another person to put on and taking off regular upper body clothing?: None Help from another person to put on and taking off regular lower body clothing?: None 6 Click Score: 24   End of Session Nurse Communication: Mobility status  Activity Tolerance: Patient tolerated treatment well Patient left: in bed  OT Visit Diagnosis: Muscle weakness (generalized) (M62.81)                Time: 1610-9604 OT Time Calculation (min): 17 min Charges:  OT General Charges $OT Visit: 1 Visit OT Evaluation $OT Eval Moderate Complexity: 1 Mod  Shanavia Makela, OTR/L Acute Care Rehab Services  Office [phone] Pager: [phone]   Kelli Churn 03/21/2020, 4:17 PM

## 2020-03-21 NOTE — ED Notes (Signed)
ECHO at bedside.

## 2020-03-21 NOTE — Plan of Care (Signed)
Of note, pt demanded AMA this am before I was able to see her. According to note, she came in for right arm hand numbness with weakness. MRI showed acute and subacute left CR and b/l thalamic infarcts. CTA head and neck unremarkable. EF 65-70% on TTE. UDS positive for cocaine.A1C 5.6. LDL 81. Her stroke felt to be small vessel source given risk factors and stroke location. She was put on DAPT and statin. She can call [facility] for neuro follow up if she desires.   Marvel Plan, MD PhD Stroke Neurology 03/22/2020 12:21 PM

## 2020-03-21 NOTE — Evaluation (Signed)
Physical Therapy Evaluation Patient Details Name: [name] MRN: [id number] DOB: 12-Jun-1986 Today's Date: 03/21/2020   History of Present Illness  : [name] is a 34 y.o. female with medical history significant of anxiety, polysubstance abuse (cocaine, alcohol, tobacco), liver cirrhosis presenting with complaints of numbness and weakness on different sides of her body.  Patient states a week ago she noticed that the right side of her body was numb and she could not walk.  She later had numbness and weakness in her left arm.  She has had alternating weakness and numbness on both sides of her body this past week.  Also reports having problems with her memory for the past 1 week.  Reports snorting cocaine.  Denies IV drug use.  Reports drinking up to four 40 ounce cans of beer daily.  She was previously on clonidine and propranolol for high blood pressure and stopped taking medications a year ago.  No additional history could be obtained from her.  Clinical Impression  The patient is ambulating without assistancewith No balance deficits noted. Mild RLE decreased strength to single limb stance. Patient does not require further PT unless deficits change. PT will sign off.    Follow Up Recommendations No PT follow up    Equipment Recommendations  None recommended by PT    Recommendations for Other Services       Precautions / Restrictions Precautions Precautions: None      Mobility  Bed Mobility Overal bed mobility: Independent                Transfers Overall transfer level: Independent                  Ambulation/Gait Ambulation/Gait assistance: Supervision Gait Distance (Feet): 125 Feet Assistive device: None Gait Pattern/deviations: Step-through pattern     General Gait Details: very mild sway  Stairs            Wheelchair Mobility    Modified Rankin (Stroke Patients Only)       Balance Overall balance assessment: Needs  assistance             Standing balance comment: able to assusme single leg stance, decreased stance on R leg, required 2 attempts. , no sway with eyes closed, able to assusme tandem and balance                             Pertinent Vitals/Pain Pain Assessment: No/denies pain    Home Living Family/patient expects to be discharged to:: Private residence Living Arrangements: Other relatives Available Help at Discharge: Friend(s) Type of Home: Mobile home Home Access: Stairs to enter   Secretary/administrator of Steps: 6 Home Layout: One level Home Equipment: None      Prior Function Level of Independence: Independent         Comments: Reports difficulties for last weak with right side but able to perform all tasks including dressing and showering and ambulating up and down steps.     Hand Dominance   Dominant Hand: Right    Extremity/Trunk Assessment   Upper Extremity Assessment Upper Extremity Assessment: RUE deficits/detail;LUE deficits/detail RUE Deficits / Details: 4/5 shoulder strength when testing shoulder strength bilaterally initially but then 5/5 when tested unilaterally. Otherwise 5/5 strength. RUE Sensation:  (Reports numbness and decreased light touch in fingertips.) RUE Coordination: WNL (Able to perform finger to thumb, finger to nose, and rapid alternativing movement. Need to be  cues to focus to improve hitting target.) LUE Deficits / Details: 5/5 ROM and strength. LUE Sensation: WNL LUE Coordination: WNL    Lower Extremity Assessment Lower Extremity Assessment: Overall WFL for tasks assessed    Cervical / Trunk Assessment Cervical / Trunk Assessment: Normal  Communication   Communication: No difficulties  Cognition Arousal/Alertness: Awake/alert Behavior During Therapy: WFL for tasks assessed/performed Overall Cognitive Status: Within Functional Limits for tasks assessed                                 General  Comments: Patient awakened from deep sleep. Initially unable to state the day of the week but eventually able to.      General Comments General comments (skin integrity, edema, etc.): Functional balance for daily tasks.    Exercises     Assessment/Plan    PT Assessment Patent does not need any further PT services  PT Problem List         PT Treatment Interventions      PT Goals (Current goals can be found in the Care Plan section)  Acute Rehab PT Goals Patient Stated Goal: to go home PT Goal Formulation: All assessment and education complete, DC therapy    Frequency     Barriers to discharge        Co-evaluation               AM-PAC PT "6 Clicks" Mobility  Outcome Measure Help needed turning from your back to your side while in a flat bed without using bedrails?: None Help needed moving from lying on your back to sitting on the side of a flat bed without using bedrails?: None Help needed moving to and from a bed to a chair (including a wheelchair)?: None Help needed standing up from a chair using your arms (e.g., wheelchair or bedside chair)?: None Help needed to walk in hospital room?: None Help needed climbing 3-5 steps with a railing? : None 6 Click Score: 24    End of Session   Activity Tolerance: Patient tolerated treatment well Patient left: in bed Nurse Communication: Mobility status PT Visit Diagnosis: Other symptoms and signs involving the nervous system (R29.898)    Time: 9323-5573 PT Time Calculation (min) (ACUTE ONLY): 20 min   Charges:   PT Evaluation $PT Eval Low Complexity: 1 Low          Blanchard Kelch PT Acute Rehabilitation Services Pager [phone] Office [phone]   Rada Hay 03/21/2020, 4:30 PM

## 2020-03-21 NOTE — Progress Notes (Signed)
Patient seen and examined, admitted by Dr. Loney Loh this morning  Briefly 34 year old female with polysubstance abuse, uncontrolled hypertension presented with bilateral numbness, right-sided weakness x 1week. MRI brain showed 3 acute to early subacute lacunar infarcts, left corona radiator/internal capsule, bilateral thalami, no associated hemorrhage.  Imaging and labs reviewed, discussed with the patient and her husband at the bedside On exam, right-sided weakness improving  Acute multiple lacunar CVA likely due to uncontrolled hypertension, polysubstance abuse -Continue full stroke work-up, seen by neurology, recommended BP control -Started on Plavix, statin, negative CTA -LDL 81, hemoglobin A1c 5.6 -Passed RN swallow screen, continue heart healthy diet -Follow 2D echo results -Neurology recommended transfer to [facility], follow-up with stroke team for further recommendations -BP improving, placed on Norvasc 5 mg daily, continue IV hydralazine as needed with parameters  Alcohol abuse -Currently stable, not in acute alcohol withdrawals -Continue CIWA protocol with Ativan, thiamine, folate   Quoc Tome M.D. [facility] 03/21/2020, 11:09 AM

## 2020-03-21 NOTE — H&P (Signed)
History and Physical    SERAPHIM AFFINITO BSW:[id number] DOB: Oct 13, 1985 DOA: 03/20/2020  PCP: Patient, No Pcp Per Patient coming from: Home  Chief Complaint: Numbness, weakness  HPI: [name] is a 34 y.o. female with medical history significant of anxiety, polysubstance abuse (cocaine, alcohol, tobacco), liver cirrhosis presenting with complaints of numbness and weakness on different sides of her body.  Patient states a week ago she noticed that the right side of her body was numb and she could not walk.  She later had numbness and weakness in her left arm.  She has had alternating weakness and numbness on both sides of her body this past week.  Also reports having problems with her memory for the past 1 week.  Reports snorting cocaine.  Denies IV drug use.  Reports drinking up to four 40 ounce cans of beer daily.  She was previously on clonidine and propranolol for high blood pressure and stopped taking medications a year ago.  No additional history could be obtained from her.  ED Course: Afebrile.  Blood pressure elevated with systolic in the 190s.  Labs showing no leukocytosis.  Blood ethanol level 14.  UDS positive for opiates and cocaine.  UA (not clean-catch) with small amount of leukocytes, greater than 50 WBCs, and few bacteria.  Urine culture pending.  SARS-CoV-2 PCR test negative.  Beta hCG negative.  Head CT showing age-indeterminate left thalamic and left periventricular lacunar infarcts.  Brain MRI showing 3 acute to early subacute lacunar infarcts-left corona radiata/internal capsule and bilateral thalami.  No associated hemorrhage or mass-effect.  Patient was given clonidine, Valium, and Zofran.  Neurology consulted.  Review of Systems:  All systems reviewed and apart from history of presenting illness, are negative.  Past Medical History:  Diagnosis Date  . Anxiety   . Cirrhosis of liver (HCC)   . Cocaine use 08/04/2018  . History of cocaine use 08/04/2018  .  Hypertension   . Polysubstance abuse (HCC)   . Renal disorder    Kidney Infection     Past Surgical History:  Procedure Laterality Date  . ARTERY REPAIR    . CESAREAN SECTION     x 5  . MOUTH SURGERY    . TEAR DUCT PROBING    . TUBAL LIGATION    . VENTRAL HERNIA REPAIR N/A 10/03/2016   Procedure: OPEN INCARCERATED HERNIA REPAIR VENTRAL ADULT;  Surgeon: Axel Filler, MD;  Location: [facility] OR;  Service: General;  Laterality: N/A;     reports that she has been smoking cigarettes. She has been smoking about 0.50 packs per day. She has never used smokeless tobacco. She reports current alcohol use. She reports current drug use. Frequency: 2.00 times per week. Drug: Cocaine.  Allergies  Allergen Reactions  . Ibuprofen Anaphylaxis    Patient thinks it may have caused her throat to swell  . Peanut-Containing Drug Products Anaphylaxis  . Penicillins Anaphylaxis and Hives    Has patient had a PCN reaction causing immediate rash, facial/tongue/throat swelling, SOB or lightheadedness with hypotension: Yes Has patient had a PCN reaction causing severe rash involving mucus membranes or skin necrosis: unknown Has patient had a PCN reaction that required hospitalization No Has patient had a PCN reaction occurring within the last 10 years: No If all of the above answers are "NO", then may proceed with Cephalosporin use.   . Acetaminophen     Causes  patient to have an upset stomach  . Codeine Hives and Itching  .  Lisinopril Swelling  . Ativan [Lorazepam] Anxiety    Hallucinations per patient    Family History  Problem Relation Age of Onset  . CAD Other   . Diabetes Other   . Hypertension Other   . Cancer Other   . Thyroid disease Other     Prior to Admission medications   Medication Sig Start Date End Date Taking? Authorizing Provider  albuterol (PROVENTIL HFA;VENTOLIN HFA) 108 (90 Base) MCG/ACT inhaler Inhale 1-2 puffs into the lungs every 6 (six) hours as needed for wheezing or  shortness of breath. 09/19/17   Leaphart, Lynann Beaver, PA-C  chlordiazePOXIDE (LIBRIUM) 25 MG capsule Take 1 capsule (25 mg total) by mouth 3 (three) times daily as needed for withdrawal. 08/04/18   Felicie Morn, NP  cloNIDine (CATAPRES) 0.1 MG tablet Take 1 tablet (0.1 mg total) by mouth 2 (two) times daily. Patient not taking: Reported on 08/04/2018 05/01/17   Elpidio Anis, PA-C  cloNIDine (CATAPRES) 0.1 MG tablet Take 1 tablet (0.1 mg total) by mouth daily. 08/04/18   Felicie Morn, NP  diazepam (VALIUM) 5 MG tablet Take 0.5-1 tablets (2.5-5 mg total) by mouth every 6 (six) hours as needed for anxiety. Patient not taking: Reported on 09/19/2017 08/05/17   Alison Murray, MD  folic acid (FOLVITE) 1 MG tablet Take 1 tablet (1 mg total) by mouth daily. Patient not taking: Reported on 09/19/2017 08/05/17   Alison Murray, MD  methocarbamol (ROBAXIN) 500 MG tablet Take 1 tablet (500 mg total) by mouth 2 (two) times daily. Patient not taking: Reported on 08/04/2018 09/19/17   Demetrios Loll T, PA-C  metoCLOPramide (REGLAN) 10 MG tablet Take 1 tablet (10 mg total) by mouth every 8 (eight) hours as needed for nausea. 08/04/18   Felicie Morn, NP  Multiple Vitamin (MULTIVITAMIN WITH MINERALS) TABS tablet Take 1 tablet by mouth daily. Patient not taking: Reported on 09/19/2017 08/05/17   Alison Murray, MD  omeprazole (PRILOSEC) 40 MG capsule Take 1 capsule (40 mg total) by mouth daily. 09/03/17   Ward, Layla Maw, DO  ondansetron (ZOFRAN ODT) 4 MG disintegrating tablet Take 1 tablet (4 mg total) by mouth every 8 (eight) hours as needed for nausea or vomiting. 09/03/17   Ward, Layla Maw, DO  predniSONE (DELTASONE) 20 MG tablet Take 3 tablets (60 mg total) by mouth daily. Patient not taking: Reported on 09/19/2017 09/03/17   Ward, Layla Maw, DO  propranolol (INDERAL) 40 MG tablet Take 1 tablet (40 mg total) by mouth 2 (two) times daily. Patient not taking: Reported on 08/04/2018 05/01/17   Elpidio Anis, PA-C    thiamine (VITAMIN B-1) 100 MG tablet Take 1 tablet (100 mg total) by mouth daily. Patient not taking: Reported on 09/19/2017 09/03/17   Ward, Layla Maw, DO    Physical Exam: Vitals:   03/21/20 0054 03/21/20 0100 03/21/20 0203 03/21/20 0300  BP: (!) 162/85 (!) 156/117 (!) 146/95 (!) 145/90  Pulse: 85 85 73 76  Resp: Temp:      TempSrc:      SpO2: 98% 98% 98% 97%  Weight:      Height:        Physical Exam Constitutional:      General: She is not in acute distress. HENT:     Head: Normocephalic and atraumatic.  Eyes:     Extraocular Movements: Extraocular movements intact.     Conjunctiva/sclera: Conjunctivae normal.  Cardiovascular:     Rate and Rhythm: Normal  rate and regular rhythm.     Pulses: Normal pulses.  Pulmonary:     Effort: Pulmonary effort is normal. No respiratory distress.     Breath sounds: Rhonchi present. No wheezing or rales.     Comments: Coughing Abdominal:     General: Bowel sounds are normal. There is no distension.     Palpations: Abdomen is soft.     Tenderness: There is no abdominal tenderness. There is no guarding.  Musculoskeletal:        General: Swelling and tenderness present.     Cervical back: Normal range of motion and neck supple.  Skin:    General: Skin is warm and dry.  Neurological:     General: No focal deficit present.     Mental Status: She is alert and oriented to person, place, and time.     Cranial Nerves: No cranial nerve deficit.     Sensory: No sensory deficit.     Motor: No weakness.  Psychiatric:        Speech: Speech is rapid and pressured.        Behavior: Behavior is hyperactive.     Labs on Admission: I have personally reviewed following labs and imaging studies  CBC: Recent Labs  Lab 03/20/20 2201 03/20/20 2336  WBC 7.6 7.7  NEUTROABS  --  4.5  HGB 12.3 12.9  HCT 40.2 41.8  MCV 88.7 87.8  PLT 275 267   Basic Metabolic Panel: Recent Labs  Lab 03/20/20 2201  NA 138  K 3.6  CL 101   CO2 25  GLUCOSE 95  BUN 9  CREATININE 0.63  CALCIUM 9.5   GFR: Estimated Creatinine Clearance: 84.3 mL/min (by C-G formula based on SCr of 0.63 mg/dL). Liver Function Tests: Recent Labs  Lab 03/20/20 2201  AST 43*  ALT 39  ALKPHOS 100  BILITOT 0.4  PROT 8.3*  ALBUMIN 4.0   No results for input(s): LIPASE, AMYLASE in the last 168 hours. No results for input(s): AMMONIA in the last 168 hours. Coagulation Profile: Recent Labs  Lab 03/20/20 2201  INR 1.1   Cardiac Enzymes: No results for input(s): CKTOTAL, CKMB, CKMBINDEX, TROPONINI in the last 168 hours. BNP (last 3 results) No results for input(s): PROBNP in the last 8760 hours. HbA1C: No results for input(s): HGBA1C in the last 72 hours. CBG: No results for input(s): GLUCAP in the last 168 hours. Lipid Profile: No results for input(s): CHOL, HDL, LDLCALC, TRIG, CHOLHDL, LDLDIRECT in the last 72 hours. Thyroid Function Tests: No results for input(s): TSH, T4TOTAL, FREET4, T3FREE, THYROIDAB in the last 72 hours. Anemia Panel: No results for input(s): VITAMINB12, FOLATE, FERRITIN, TIBC, IRON, RETICCTPCT in the last 72 hours. Urine analysis:    Component Value Date/Time   COLORURINE YELLOW 03/20/2020 2103   APPEARANCEUR CLOUDY (A) 03/20/2020 2103   LABSPEC 1.029 03/20/2020 2103   PHURINE 5.0 03/20/2020 2103   GLUCOSEU NEGATIVE 03/20/2020 2103   HGBUR NEGATIVE 03/20/2020 2103   BILIRUBINUR NEGATIVE 03/20/2020 2103   KETONESUR NEGATIVE 03/20/2020 2103   PROTEINUR 100 (A) 03/20/2020 2103   UROBILINOGEN 0.2 02/23/2013 1447   NITRITE NEGATIVE 03/20/2020 2103   LEUKOCYTESUR SMALL (A) 03/20/2020 2103    Radiological Exams on Admission: CT Head Wo Contrast  Result Date: 03/20/2020 CLINICAL DATA:  34 year old female with altered mental status. EXAM: CT HEAD WITHOUT CONTRAST TECHNIQUE: Contiguous axial images were obtained from the base of the skull through the vertex without intravenous contrast. COMPARISON:  None.  FINDINGS:  Brain: The ventricles and sulci appropriate size for patient's age. Faint subcentimeter hypodense focus in the left periventricular white matter (18/2) as well as an additional focus in the left thalamus (15/2) consistent with age indeterminate lacunar infarcts, new since the prior CT. Acute infarct is not excluded clinical correlation recommended. MRI may provide better evaluation. There is no acute intracranial hemorrhage. No mass effect or midline shift. No extra-axial fluid collection. Vascular: No hyperdense vessel or unexpected calcification. Skull: Normal. Negative for fracture or focal lesion. Sinuses/Orbits: No acute finding. Other: None IMPRESSION: 1. No acute intracranial hemorrhage. 2. Age indeterminate left thalamic and left periventricular lacunar infarcts. Acute infarct is not excluded. MRI may provide better evaluation. These results were called by telephone at the time of interpretation on 03/20/2020 at 9:36 pm to provider [facility] , who verbally acknowledged these results. Electronically Signed   By: Elgie Collard M.D.   On: 03/20/2020 21:37   MR BRAIN WO CONTRAST  Result Date: 03/20/2020 CLINICAL DATA:  34 year old female with altered mental status, neurologic deficits. Abnormal plain head CT tonight. Polysubstance abuse, cirrhosis. EXAM: MRI HEAD WITHOUT CONTRAST TECHNIQUE: Multiplanar, multiecho pulse sequences of the brain and surrounding structures were obtained without intravenous contrast. COMPARISON:  Head CT 2122 hours.  Prior head CT 05/01/2017. FINDINGS: Brain: Several foci of abnormal diffusion, corresponding to the areas of hypodensity on the earlier CT. The largest tracks from the posterior left corona radiata toward the posterior limb of the left internal capsule/lentiform (series 5, image 85). And there are bilateral thalamic foci of restricted diffusion, the larger on the left is 6 mm. On ADC these are isointense to restricted. T2 and FLAIR hyperintensity in  each of these areas with no hemorrhage or mass effect. No other restricted diffusion. No superimposed chronic encephalomalacia identified. No chronic cerebral blood products. The brainstem and cerebellum appear within normal limits. No midline shift, mass effect, evidence of mass lesion, ventriculomegaly, extra-axial collection or acute intracranial hemorrhage. Cervicomedullary junction and pituitary are within normal limits. Vascular: Major intracranial vascular flow voids are preserved. Skull and upper cervical spine: Negative visible cervical spine. Nonspecific decreased T1 signal in the visible skeleton, with no destructive osseous lesion identified. Sinuses/Orbits: Negative orbits. Paranasal sinuses and mastoids are stable and well pneumatized. Other: Scalp and face soft tissues appear IMPRESSION: 1. Three acute to early subacute lacunar infarcts corresponding to the CT abnormalities: Left corona radiata/internal capsule, and bilateral thalami. No associated hemorrhage or mass effect. Elsewhere the brain parenchyma appears normal. 2. Decreased but nonspecific marrow signal in the visible skeleton. Electronically Signed   By: Odessa Fleming M.D.   On: 03/20/2020 22:45    EKG: Independently reviewed.  Sinus rhythm.  RSR prime in V1 or V2.  Assessment/Plan Principal Problem:   Acute CVA (cerebrovascular accident) [facility]) Active Problems:   Substance abuse (HCC)   Hypertensive urgency   Bronchitis   Asymptomatic bacteriuria   Acute ischemic stroke: Presenting with complaints of alternating weakness and numbness on both sides of her body for the past 1 week.  Neuro exam currently nonfocal.  Brain MRI showing 3 acute to early subacute lacunar infarcts-left corona radiata/internal capsule and bilateral thalami.  No associated hemorrhage or mass-effect.  Neurology feels this is likely secondary to concomitant small vessel disease in the setting of cocaine use and uncontrolled hypertension or secondary to  vasospasm.  Cardioembolic source is also possibility. -Telemetry monitoring -No need for permissive hypertension as symptoms have been ongoing for a week -Neurology recommending holding off  aspirin in case there is any evidence of endocarditis or mycotic aneurysms on vessel imaging -CTA head and neck -2D echocardiogram -Hemoglobin A1c, fasting lipid panel -Defer decision regarding starting statin to neurology/stroke team -Defer decision regarding doing hypercoagulable work-up to neurology/stroke team -Frequent neurochecks -PT, OT, speech therapy. -N.p.o. until cleared by bedside swallow evaluation or formal speech evaluation  Hypertensive urgency: Likely secondary to medication noncompliance.  Blood pressure elevated with systolic in the 190s initially.  Blood pressure now improved with systolic in the 140s and diastolic in the 90s. -Hydralazine as needed for SBP >160 and monitor blood pressure closely  Acute bronchitis: Cough and rhonchi on exam.  Not tachypneic or hypoxic.  No fever or leukocytosis.  SARS-CoV-2 PCR test negative. -Mucinex, albuterol nebulizer every 6 hours  Substance abuse: UDS positive for opiates and cocaine. -Social work consult and counseling  Alcohol use disorder: Patient reports drinking up to four 40 ounce cans of beer daily. -CIWA protocol; Ativan as needed.  Thiamine, folate, and multivitamin.  Tobacco use: Smokes 1/2 pack of cigarettes daily. -NicoDerm patch and counseling  Asymptomatic bacteriuria: UA (not clean-catch) with small amount of leukocytes, greater than 50 WBCs, and few bacteria.  Patient is not endorsing any UTI symptoms. -Urine culture pending  DVT prophylaxis: Lovenox Code Status: Full code Family Communication: Boyfriend at bedside. Disposition Plan: Status is: Inpatient  Remains inpatient appropriate because:Ongoing diagnostic testing needed not appropriate for outpatient work up and Inpatient level of care appropriate due to severity  of illness   Dispo: The patient is from: Home              Anticipated d/c is to: Home              Anticipated d/c date is: 2 days              Patient currently is not medically stable to d/c.  The medical decision making on this patient was of high complexity and the patient is at high risk for clinical deterioration, therefore this is a level 3 visit.  John Giovanni MD [facility]  If 7PM-7AM, please contact night-coverage [email or web address]  03/21/2020, 3:29 AM

## 2020-03-21 NOTE — Progress Notes (Signed)
  Echocardiogram 2D Echocardiogram has been performed.  Nicoletta Hush G Iridessa Harrow 03/21/2020, 11:50 AM

## 2020-03-21 NOTE — Consult Note (Signed)
Neurology Consultation  Reason for Consult: Bilateral numbness, right-sided weakness, abnormal MRI showing strokes Referring Physician: Dierdre Forth, PA-C  CC: Bilateral numbness, right-sided weakness  History is obtained from: Patient, chart  HPI: [name] is a 34 y.o. female past medical history of cocaine abuse, polysubstance abuse, cirrhosis, anxiety, hypertension, noncompliant to antihypertensives, presenting to the emergency room for evaluation of 1 to 2 weeks worth of neurological symptoms as described below. Reports that about a week or so ago started noticing numbness that started on the right arm and then resolved after a few days.  Then she started noticing numbness in the left arm which also remained for a few days and then resolved.  The only residual deficits from that are numbness in 3 fingers in the right hand.  She also then started noticing some weakness of the right arm which has persisted-not improved or worsened. She came into the emergency room at [facility], was evaluated by the ED providers and due to the multitude of complaints and difficulty localizing her exam, they obtained an MRI of the brain which revealed bilateral thalamic infarctions that look acute to subacute as well as in the left corona radiata/internal capsule. Patient was hypertensive in the emergency room with systolics as high as 195. She reports that she is noncompliant to medications because she ran out of medications and had no way to procure them.  This is the first time she has been without antihypertensives according to her. Denies any visual changes.  Reports some anxiety especially after having had the MRI done due to claustrophobia.  Reports some back pain but otherwise review of systems completed and unremarkable. Reports abusing cocaine off and on.  Also reports having abused benzodiazepine as a young adult but not anymore.  LKW: 1 week ago tpa given?: no, outside the  window Premorbid modified Rankin scale (mRS):0  ROS: Performed and negative except as noted in HPI  Past Medical History:  Diagnosis Date  . Anxiety   . Cirrhosis of liver (HCC)   . Cocaine use 08/04/2018  . History of cocaine use 08/04/2018  . Hypertension   . Polysubstance abuse (HCC)   . Renal disorder    Kidney Infection     Family History  Problem Relation Age of Onset  . CAD Other   . Diabetes Other   . Hypertension Other   . Cancer Other   . Thyroid disease Other     Social History:   reports that she has been smoking cigarettes. She has been smoking about 0.50 packs per day. She has never used smokeless tobacco. She reports current alcohol use. She reports current drug use. Frequency: 2.00 times per week. Drug: Cocaine.  Medications  Current Facility-Administered Medications:  .  nicotine (NICODERM CQ - dosed in mg/24 hours) patch 21 mg, 21 mg, Transdermal, Daily, Muthersbaugh, Hannah, PA-C, 21 mg at 03/21/20 0121  Current Outpatient Medications:  .  albuterol (PROVENTIL HFA;VENTOLIN HFA) 108 (90 Base) MCG/ACT inhaler, Inhale 1-2 puffs into the lungs every 6 (six) hours as needed for wheezing or shortness of breath., Disp: 1 Inhaler, Rfl: 0 .  chlordiazePOXIDE (LIBRIUM) 25 MG capsule, Take 1 capsule (25 mg total) by mouth 3 (three) times daily as needed for withdrawal., Disp: 20 capsule, Rfl: 0 .  cloNIDine (CATAPRES) 0.1 MG tablet, Take 1 tablet (0.1 mg total) by mouth 2 (two) times daily. (Patient not taking: Reported on 08/04/2018), Disp: 60 tablet, Rfl: 0 .  cloNIDine (CATAPRES) 0.1 MG  tablet, Take 1 tablet (0.1 mg total) by mouth daily., Disp: 10 tablet, Rfl: 0 .  diazepam (VALIUM) 5 MG tablet, Take 0.5-1 tablets (2.5-5 mg total) by mouth every 6 (six) hours as needed for anxiety. (Patient not taking: Reported on 09/19/2017), Disp: 15 tablet, Rfl: 0 .  folic acid (FOLVITE) 1 MG tablet, Take 1 tablet (1 mg total) by mouth daily. (Patient not taking: Reported on  09/19/2017), Disp: 30 tablet, Rfl: 0 .  methocarbamol (ROBAXIN) 500 MG tablet, Take 1 tablet (500 mg total) by mouth 2 (two) times daily. (Patient not taking: Reported on 08/04/2018), Disp: 10 tablet, Rfl: 0 .  metoCLOPramide (REGLAN) 10 MG tablet, Take 1 tablet (10 mg total) by mouth every 8 (eight) hours as needed for nausea., Disp: 20 tablet, Rfl: 0 .  Multiple Vitamin (MULTIVITAMIN WITH MINERALS) TABS tablet, Take 1 tablet by mouth daily. (Patient not taking: Reported on 09/19/2017), Disp: 30 tablet, Rfl: 0 .  omeprazole (PRILOSEC) 40 MG capsule, Take 1 capsule (40 mg total) by mouth daily., Disp: 30 capsule, Rfl: 1 .  ondansetron (ZOFRAN ODT) 4 MG disintegrating tablet, Take 1 tablet (4 mg total) by mouth every 8 (eight) hours as needed for nausea or vomiting., Disp: 20 tablet, Rfl: 0 .  predniSONE (DELTASONE) 20 MG tablet, Take 3 tablets (60 mg total) by mouth daily. (Patient not taking: Reported on 09/19/2017), Disp: 15 tablet, Rfl: 0 .  propranolol (INDERAL) 40 MG tablet, Take 1 tablet (40 mg total) by mouth 2 (two) times daily. (Patient not taking: Reported on 08/04/2018), Disp: 60 tablet, Rfl: 0 .  thiamine (VITAMIN B-1) 100 MG tablet, Take 1 tablet (100 mg total) by mouth daily. (Patient not taking: Reported on 09/19/2017), Disp: 30 tablet, Rfl: 1   Exam: Current vital signs: BP (!) 162/85   Pulse 85   Temp 98 F (36.7 C) (Oral)   Resp 16   Ht 5' (1.524 m)   Wt 68.9 kg   SpO2 98%   BMI 29.69 kg/m  Vital signs in last 24 hours: Temp:  [98 F (36.7 C)] 98 F (36.7 C) (07/28 2036) Pulse Rate:  [85-105] 85 (07/29 0054) Resp:  [16-20] 16 (07/29 0054) BP: (162-190)/(85-123) 162/85 (07/29 0054) SpO2:  [97 %-98 %] 98 % (07/29 0054) Weight:  [68.9 kg] 68.9 kg (07/28 2040) General: Awake alert anxious appearing in no acute distress HEENT: Normocephalic atraumatic dry oral mucous membranes Lungs: Clear Cardiovascular: Regular rate rhythm Abdomen: Nondistended nontender Extremities  warm well perfused without edema Neurological exam Awake alert oriented x3 No dysarthria No aphasia Cranial nerves: Pupils equal round react light, extraocular movements intact, visual fields full, facial sensation intact bilaterally, face is symmetric, tongue and palate midline. Motor exam: No vertical drift in any of the 4 extremities but on individual testing, right upper extremity is 4+/5 in comparison with the 5/5 strength of the left upper extremity. Sensory exam: Diminished sensation on the right hand in comparison to the left. Coordination: No dysmetria NIH stroke scale: 1 for sensation.  Labs I have reviewed labs in epic and the results pertinent to this consultation are:  CBC    Component Value Date/Time   WBC 7.7 03/20/2020 2336   RBC 4.76 03/20/2020 2336   HGB 12.9 03/20/2020 2336   HCT 41.8 03/20/2020 2336   PLT 267 03/20/2020 2336   MCV 87.8 03/20/2020 2336   MCH 27.1 03/20/2020 2336   MCHC 30.9 03/20/2020 2336   RDW 18.7 (H) 03/20/2020 2336   LYMPHSABS  2.3 03/20/2020 2336   MONOABS 0.6 03/20/2020 2336   EOSABS 0.3 03/20/2020 2336   BASOSABS 0.1 03/20/2020 2336    CMP     Component Value Date/Time   NA 138 03/20/2020 2201   K 3.6 03/20/2020 2201   CL 101 03/20/2020 2201   CO2 25 03/20/2020 2201   GLUCOSE 95 03/20/2020 2201   BUN 9 03/20/2020 2201   CREATININE 0.63 03/20/2020 2201   CREATININE 0.69 09/23/2016 1442   CALCIUM 9.5 03/20/2020 2201   PROT 8.3 (H) 03/20/2020 2201   ALBUMIN 4.0 03/20/2020 2201   AST 43 (H) 03/20/2020 2201   ALT 39 03/20/2020 2201   ALKPHOS 100 03/20/2020 2201   BILITOT 0.4 03/20/2020 2201   GFRNONAA >60 03/20/2020 2201   GFRNONAA >89 09/23/2016 1442   GFRAA >60 03/20/2020 2201   GFRAA >89 09/23/2016 1442    Imaging I have reviewed the images obtained:  CT-scan of the brain-no bleed.  Left thalamic, left periventricular subacute to acute infarction suspected.  MRI examination of the brain-confirms acute to early  subacute infarctions-in the left corona radiata/internal capsule, left thalamus and right thalamus.  No bleed.  Brain parenchyma otherwise normal.  Assessment:  34 year old woman with past medical history that includes cocaine and polysubstance abuse and uncontrolled hypertension with noncompliance to medication presents for evaluation of bilateral numbness and right sided weakness that has been ongoing for about a week.  The sensory symptoms lasted for a while and have resolved but do appear off-and-on but right-sided weakness in the upper extremity has been persistent. She has been noted to have early subacute to acute infarcts in the left corona radiata/internal capsule, left thalamus and right thalamus, likely secondary to concomitant small vessel disease in the setting of cocaine use and uncontrolled blood pressures or secondary to vasospasm. Deep gray matter-thalami-are not typical locations for an embolic stroke but given her young age and history of polysubstance abuse, and investigation for an embolic source should be conducted.  Impression: Acute ischemic stroke-small vessel etiology versus cardioembolic Polysubstance abuse-including cocaine-urinary toxicology screen also positive for opiates.  Recommendations: Admit to hospitalist-at [facility]. Due to the symptoms now ongoing for over a week, no need for permissive hypertension.  Treat like hypertensive emergency and bring blood pressures down by up to 20% every day with a goal of normalizing them upon discharge. I will hold off on aspirin for now in case there is any evidence of endocarditis or mycotic aneurysms on vessel imaging. CTA head and neck 2D echo-might need TEE A1c Lipid panel PT OT Speech therapy N.p.o. until cleared by bedside swallow evaluation/stroke screen. I do not see a need to do a hypercoagulable panel because of this being likely related to hypertension as well as cocaine abuse.  I will defer to the  stroke team rounding and availability of more results to see if hypercoagulable Needs to be considered.  Discussed my plan with Dr. Loney Loh, admitting hospitalist.  Also discussed with Dierdre Forth, PA-C in the emergency room.  Stroke team will follow with you once the patient arrives at [facility].  -- Milon Dikes, MD [facility] Pager: [phone] If 7pm to 7am, please call on call as listed on AMION.

## 2020-03-21 NOTE — ED Notes (Signed)
Report called to [facility] and [facility] called for transport.  Patient going to room 27.

## 2020-03-22 ENCOUNTER — Encounter (HOSPITAL_COMMUNITY): Payer: Self-pay

## 2020-03-22 LAB — URINE CULTURE: Culture: 60000 — AB

## 2020-03-22 MED ORDER — POTASSIUM CHLORIDE CRYS ER 20 MEQ PO TBCR
20.0000 meq | EXTENDED_RELEASE_TABLET | Freq: Once | ORAL | Status: AC
  Administered 2020-03-22: 20 meq via ORAL
  Filled 2020-03-22: qty 1

## 2020-03-22 MED ORDER — HYDROCHLOROTHIAZIDE 25 MG PO TABS: 25.0000 mg | ORAL_TABLET | Freq: Every day | ORAL | Status: DC

## 2020-03-22 MED ORDER — GABAPENTIN 300 MG PO CAPS: 300.0000 mg | ORAL_CAPSULE | Freq: Two times a day (BID) | ORAL | Status: DC

## 2020-03-22 MED ORDER — CLONIDINE HCL 0.1 MG PO TABS: 0.1000 mg | ORAL_TABLET | Freq: Two times a day (BID) | ORAL | Status: DC

## 2020-03-22 MED ORDER — AMLODIPINE BESYLATE 10 MG PO TABS
10.0000 mg | ORAL_TABLET | Freq: Every day | ORAL | Status: DC
  Administered 2020-03-22: 10 mg via ORAL
  Filled 2020-03-22: qty 1

## 2020-03-22 NOTE — Progress Notes (Signed)
Pt becoming more and more agitated. She is sitting on side of bed pulling off her cardiac leads. Pt yelling "get me the hell out of here! They've had 3 days to talk to me about whats going on and aint no one done that". Pt got her mom on speaker phone and her mom began cursing and cussing to this nurse stating " get her papers together or else! I'm gonna be there in 30 minutes and theres gonna be holy hell if she aint ready". Attempted to reassure patient and her mom however, they were not receptive and continued cussing stating, "just get me out of here"!! Updated charge nurse who then paged and messaged provider. Pt did leave AMA, IV removed by leadership nurse.

## 2020-03-22 NOTE — Progress Notes (Signed)
Pt could be heard screaming into the hallway by staff that she could not breathe and was in severe pain. RN went into room to assess pt. Pt was complaining of right leg pain 10/10 and was  Tachycardic with HR in the 120s and O2 saturations 99% on room air. Pt refused to allow RN to take blood pressure and kept ripping blood pressure cuff off arm and stated, "Give me something for pain now! I can't take that thing on my arm." Pt continued yelling and screaming at RN to page doctor to get her something for pain. RN paged Dr. Leafy Half and reported pt's symptoms. RN administered 5 mg oxycodone as ordered and notified rapid response RN about pt status Will continue to monitor and assess patient.

## 2020-03-22 NOTE — Discharge Summary (Signed)
Physician Discharge Summary  [name] VKP:[id number] DOB: February 03, 1986 DOA: 03/20/2020  PCP: Patient, No Pcp Per  Admit date: 03/20/2020 Discharge date: 03/22/2020    Recommendations for Outpatient Follow-up:  1. Needs follow up with neurology and PCP  2. Needs controlled BP 3. Patient left AMA, I was not able to see patient prior leaving AMA.    Brief/Interim Summary: Patient admitted for acute a stroke with right side numbness. She was a started on blood pressure medication, statin and aspirin.  I saw patient earlier this morning and counsel her regards cocaine use.  Subsequently patient became agitated demanded leaving AMA.  I was not able to see patient prior to leaving from the hospital, she was gone by the time that I arrived back to the unit.  She will need close follow-up for multiple risk factors adjustment.  Discharge Diagnoses:  Principal Problem:   Acute CVA (cerebrovascular accident) [facility]) Active Problems:   Substance abuse (HCC)   Hypertensive urgency   Bronchitis   Asymptomatic bacteriuria    Discharge Instructions     Allergies  Allergen Reactions  . Ibuprofen Anaphylaxis    Patient thinks it may have caused her throat to swell  . Peanut-Containing Drug Products Anaphylaxis  . Penicillins Anaphylaxis and Hives    Has patient had a PCN reaction causing immediate rash, facial/tongue/throat swelling, SOB or lightheadedness with hypotension: Yes Has patient had a PCN reaction causing severe rash involving mucus membranes or skin necrosis: unknown Has patient had a PCN reaction that required hospitalization No Has patient had a PCN reaction occurring within the last 10 years: No If all of the above answers are "NO", then may proceed with Cephalosporin use.   . Acetaminophen     Causes  patient to have an upset stomach  . Codeine Hives and Itching  . Lisinopril Swelling  . Ativan [Lorazepam] Anxiety    Hallucinations per patient     Consultations:  Neurology    Procedures/Studies: CT ANGIO HEAD W OR WO CONTRAST  Result Date: 03/21/2020 CLINICAL DATA:  Alternating numbness (from side to side) after a bug bite 1 week ago EXAM: CT ANGIOGRAPHY HEAD AND NECK TECHNIQUE: Multidetector CT imaging of the head and neck was performed using the standard protocol during bolus administration of intravenous contrast. Multiplanar CT image reconstructions and MIPs were obtained to evaluate the vascular anatomy. Carotid stenosis measurements (when applicable) are obtained utilizing NASCET criteria, using the distal internal carotid diameter as the denominator. CONTRAST:  OMNIPAQUE IOHEXOL 350 MG/ML SOLN COMPARISON:  Brain MRI from yesterday FINDINGS: CTA NECK FINDINGS Aortic arch: Normal Right carotid system: Tortuous ICA but no stenosis, ulceration, or beading. No noted atheromatous changes. Left carotid system: Tortuous ICA. No atheromatous changes, stenosis, or ulceration. Vertebral arteries: No proximal subclavian stenosis. Codominant vertebral arteries which are smooth and widely patent to the dura. Skeleton: Severe dental caries. Other neck: Negative Upper chest: Negative Review of the MIP images confirms the above findings CTA HEAD FINDINGS Anterior circulation: Vessels are smooth and widely patent. No atheromatous changes, aneurysm, or beading. Posterior circulation: The vertebral and basilar arteries are widely patent. No branch occlusion, beading, or aneurysm. Fetal type left PCA Venous sinuses: Diffusely patent Anatomic variants: As above Review of the MIP images confirms the above findings IMPRESSION: Negative CTA. Electronically Signed   By: Marnee Spring M.D.   On: 03/21/2020 05:58   CT Head Wo Contrast  Result Date: 03/20/2020 CLINICAL DATA:  34 year old female with altered mental status. EXAM:  CT HEAD WITHOUT CONTRAST TECHNIQUE: Contiguous axial images were obtained from the base of the skull through the vertex without  intravenous contrast. COMPARISON:  None. FINDINGS: Brain: The ventricles and sulci appropriate size for patient's age. Faint subcentimeter hypodense focus in the left periventricular white matter (18/2) as well as an additional focus in the left thalamus (15/2) consistent with age indeterminate lacunar infarcts, new since the prior CT. Acute infarct is not excluded clinical correlation recommended. MRI may provide better evaluation. There is no acute intracranial hemorrhage. No mass effect or midline shift. No extra-axial fluid collection. Vascular: No hyperdense vessel or unexpected calcification. Skull: Normal. Negative for fracture or focal lesion. Sinuses/Orbits: No acute finding. Other: None IMPRESSION: 1. No acute intracranial hemorrhage. 2. Age indeterminate left thalamic and left periventricular lacunar infarcts. Acute infarct is not excluded. MRI may provide better evaluation. These results were called by telephone at the time of interpretation on 03/20/2020 at 9:36 pm to provider [facility] , who verbally acknowledged these results. Electronically Signed   By: Elgie Collard M.D.   On: 03/20/2020 21:37   CT ANGIO NECK W OR WO CONTRAST  Result Date: 03/21/2020 CLINICAL DATA:  Alternating numbness (from side to side) after a bug bite 1 week ago EXAM: CT ANGIOGRAPHY HEAD AND NECK TECHNIQUE: Multidetector CT imaging of the head and neck was performed using the standard protocol during bolus administration of intravenous contrast. Multiplanar CT image reconstructions and MIPs were obtained to evaluate the vascular anatomy. Carotid stenosis measurements (when applicable) are obtained utilizing NASCET criteria, using the distal internal carotid diameter as the denominator. CONTRAST:  OMNIPAQUE IOHEXOL 350 MG/ML SOLN COMPARISON:  Brain MRI from yesterday FINDINGS: CTA NECK FINDINGS Aortic arch: Normal Right carotid system: Tortuous ICA but no stenosis, ulceration, or beading. No noted atheromatous  changes. Left carotid system: Tortuous ICA. No atheromatous changes, stenosis, or ulceration. Vertebral arteries: No proximal subclavian stenosis. Codominant vertebral arteries which are smooth and widely patent to the dura. Skeleton: Severe dental caries. Other neck: Negative Upper chest: Negative Review of the MIP images confirms the above findings CTA HEAD FINDINGS Anterior circulation: Vessels are smooth and widely patent. No atheromatous changes, aneurysm, or beading. Posterior circulation: The vertebral and basilar arteries are widely patent. No branch occlusion, beading, or aneurysm. Fetal type left PCA Venous sinuses: Diffusely patent Anatomic variants: As above Review of the MIP images confirms the above findings IMPRESSION: Negative CTA. Electronically Signed   By: Marnee Spring M.D.   On: 03/21/2020 05:58   MR BRAIN WO CONTRAST  Result Date: 03/20/2020 CLINICAL DATA:  34 year old female with altered mental status, neurologic deficits. Abnormal plain head CT tonight. Polysubstance abuse, cirrhosis. EXAM: MRI HEAD WITHOUT CONTRAST TECHNIQUE: Multiplanar, multiecho pulse sequences of the brain and surrounding structures were obtained without intravenous contrast. COMPARISON:  Head CT 2122 hours.  Prior head CT 05/01/2017. FINDINGS: Brain: Several foci of abnormal diffusion, corresponding to the areas of hypodensity on the earlier CT. The largest tracks from the posterior left corona radiata toward the posterior limb of the left internal capsule/lentiform (series 5, image 85). And there are bilateral thalamic foci of restricted diffusion, the larger on the left is 6 mm. On ADC these are isointense to restricted. T2 and FLAIR hyperintensity in each of these areas with no hemorrhage or mass effect. No other restricted diffusion. No superimposed chronic encephalomalacia identified. No chronic cerebral blood products. The brainstem and cerebellum appear within normal limits. No midline shift, mass effect,  evidence of mass  lesion, ventriculomegaly, extra-axial collection or acute intracranial hemorrhage. Cervicomedullary junction and pituitary are within normal limits. Vascular: Major intracranial vascular flow voids are preserved. Skull and upper cervical spine: Negative visible cervical spine. Nonspecific decreased T1 signal in the visible skeleton, with no destructive osseous lesion identified. Sinuses/Orbits: Negative orbits. Paranasal sinuses and mastoids are stable and well pneumatized. Other: Scalp and face soft tissues appear IMPRESSION: 1. Three acute to early subacute lacunar infarcts corresponding to the CT abnormalities: Left corona radiata/internal capsule, and bilateral thalami. No associated hemorrhage or mass effect. Elsewhere the brain parenchyma appears normal. 2. Decreased but nonspecific marrow signal in the visible skeleton. Electronically Signed   By: Odessa Fleming M.D.   On: 03/20/2020 22:45   ECHOCARDIOGRAM COMPLETE  Result Date: 03/21/2020    ECHOCARDIOGRAM REPORT   Patient Name:   [name] Date of Exam: 03/21/2020 Medical Rec #:  [id number]          Height:       60.0 in Accession #:    [id number]         Weight:       152.0 lb Date of Birth:  Aug 26, 1985           BSA:          1.661 m Patient Age:    34 years           BP:           147/92 mmHg Patient Gender: F                  HR:           80 bpm. Exam Location:  Inpatient Procedure: 2D Echo, Cardiac Doppler and Color Doppler Indications:    Stroke 434.91 / I163.9  History:        Patient has no prior history of Echocardiogram examinations.                 Risk Factors:Hypertension. Renal Disorder. Polysubstance abuse.  Sonographer:    Elmarie Shiley Dance Referring Phys: [id number] VASUNDHRA RATHORE IMPRESSIONS  1. Left ventricular ejection fraction, by estimation, is 65 to 70%. The left ventricle has normal function. The left ventricle has no regional wall motion abnormalities. There is moderate left ventricular hypertrophy. Left ventricular  diastolic parameters are consistent with Grade II diastolic dysfunction (pseudonormalization). Elevated left atrial pressure.  2. Right ventricular systolic function is normal. The right ventricular size is normal. Tricuspid regurgitation signal is inadequate for assessing PA pressure.  3. Left atrial size was moderately dilated.  4. Right atrial size was mildly dilated.  5. The mitral valve is normal in structure. No evidence of mitral valve regurgitation.  6. The aortic valve is tricuspid. Aortic valve regurgitation is not visualized. No aortic stenosis is present.  7. The inferior vena cava is normal in size with greater than 50% respiratory variability, suggesting right atrial pressure of 3 mmHg. FINDINGS  Left Ventricle: Left ventricular ejection fraction, by estimation, is 65 to 70%. The left ventricle has normal function. The left ventricle has no regional wall motion abnormalities. The left ventricular internal cavity size was normal in size. There is  moderate left ventricular hypertrophy. Left ventricular diastolic parameters are consistent with Grade II diastolic dysfunction (pseudonormalization). Elevated left atrial pressure. Right Ventricle: The right ventricular size is normal. No increase in right ventricular wall thickness. Right ventricular systolic function is normal. Tricuspid regurgitation signal is inadequate for assessing PA pressure. Left Atrium: Left atrial size was  moderately dilated. Right Atrium: Right atrial size was mildly dilated. Pericardium: There is no evidence of pericardial effusion. Mitral Valve: The mitral valve is normal in structure. No evidence of mitral valve regurgitation. Tricuspid Valve: The tricuspid valve is normal in structure. Tricuspid valve regurgitation is trivial. Aortic Valve: The aortic valve is tricuspid. Aortic valve regurgitation is not visualized. No aortic stenosis is present. Pulmonic Valve: The pulmonic valve was not well visualized. Pulmonic valve  regurgitation is trivial. Aorta: The aortic root is normal in size and structure and the aortic root and ascending aorta are structurally normal, with no evidence of dilitation. Venous: The inferior vena cava is normal in size with greater than 50% respiratory variability, suggesting right atrial pressure of 3 mmHg. IAS/Shunts: The interatrial septum was not well visualized.  LEFT VENTRICLE PLAX 2D LVIDd:         4.00 cm  Diastology LVIDs:         2.53 cm  LV e' lateral:   5.66 cm/s LV PW:         1.58 cm  LV E/e' lateral: 16.7 LV IVS:        1.12 cm  LV e' medial:    5.33 cm/s LVOT diam:     2.20 cm  LV E/e' medial:  17.8 LV SV:         94 LV SV Index:   56 LVOT Area:     3.80 cm  RIGHT VENTRICLE            IVC RV Basal diam:  2.78 cm    IVC diam: 1.24 cm RV S prime:     8.20 cm/s TAPSE (M-mode): 2.0 cm LEFT ATRIUM             Index       RIGHT ATRIUM           Index LA diam:        3.70 cm 2.23 cm/m  RA Area:     17.20 cm LA Vol (A2C):   79.0 ml 47.56 ml/m RA Volume:   46.90 ml  28.23 ml/m LA Vol (A4C):   60.5 ml 36.42 ml/m LA Biplane Vol: 68.9 ml 41.48 ml/m  AORTIC VALVE LVOT Vmax:   114.00 cm/s LVOT Vmean:  80.600 cm/s LVOT VTI:    0.246 m  AORTA Ao Root diam: 3.30 cm Ao Asc diam:  2.90 cm MITRAL VALVE MV Area (PHT): 3.53 cm    SHUNTS MV Decel Time: 215 msec    Systemic VTI:  0.25 m MV E velocity: 94.70 cm/s  Systemic Diam: 2.20 cm MV A velocity: 96.10 cm/s MV E/A ratio:  0.99 Epifanio Lesches MD Electronically signed by Epifanio Lesches MD Signature Date/Time: 03/21/2020/2:29:07 PM    Final          The results of significant diagnostics from this hospitalization (including imaging, microbiology, ancillary and laboratory) are listed below for reference.     Microbiology: Recent Results (from the past 240 hour(s))  Urine culture     Status: Abnormal   Collection Time: 03/20/20 10:39 PM   Specimen: Urine, Clean Catch  Result Value Ref Range Status   Specimen Description   Final     URINE, CLEAN CATCH Performed at [facility], [address] [place], [place] [address]    Special Requests   Final    NONE Performed at [facility], [address] [place], [place] [address]    Culture 60,000 COLONIES/mL  ESCHERICHIA COLI (A)  Final   Report Status 03/22/2020 FINAL  Final   Organism ID, Bacteria ESCHERICHIA COLI (A)  Final      Susceptibility   Escherichia coli - MIC*    AMPICILLIN <=2 SENSITIVE Sensitive     CEFAZOLIN <=4 SENSITIVE Sensitive     CEFTRIAXONE <=0.25 SENSITIVE Sensitive     CIPROFLOXACIN <=0.25 SENSITIVE Sensitive     GENTAMICIN <=1 SENSITIVE Sensitive     IMIPENEM <=0.25 SENSITIVE Sensitive     NITROFURANTOIN <=16 SENSITIVE Sensitive     TRIMETH/SULFA <=20 SENSITIVE Sensitive     AMPICILLIN/SULBACTAM <=2 SENSITIVE Sensitive     PIP/TAZO <=4 SENSITIVE Sensitive     * 60,000 COLONIES/mL ESCHERICHIA COLI  SARS Coronavirus 2 by RT PCR (hospital order, performed in [facility] hospital lab) Nasopharyngeal Nasopharyngeal Swab     Status: None   Collection Time: 03/20/20 11:07 PM   Specimen: Nasopharyngeal Swab  Result Value Ref Range Status   SARS Coronavirus 2 NEGATIVE NEGATIVE Final    Comment: (NOTE) SARS-CoV-2 target nucleic acids are NOT DETECTED.  The SARS-CoV-2 RNA is generally detectable in upper and lower respiratory specimens during the acute phase of infection. The lowest concentration of SARS-CoV-2 viral copies this assay can detect is 250 copies / mL. A negative result does not preclude SARS-CoV-2 infection and should not be used as the sole basis for treatment or other patient management decisions.  A negative result may occur with improper specimen collection / handling, submission of specimen other than nasopharyngeal swab, presence of viral mutation(s) within the areas targeted by this assay, and inadequate number of viral copies (<250 copies / mL). A negative result must be combined with  clinical observations, patient history, and epidemiological information.  Fact Sheet for Patients:   BoilerBrush.com.cy  Fact Sheet for Healthcare Providers: https://pope.com/  This test is not yet approved or  cleared by the [place] FDA and has been authorized for detection and/or diagnosis of SARS-CoV-2 by FDA under an Emergency Use Authorization (EUA).  This EUA will remain in effect (meaning this test can be used) for the duration of the COVID-19 declaration under Section 564(b)(1) of the Act, 21 U.S.C. section 360bbb-3(b)(1), unless the authorization is terminated or revoked sooner.  Performed at [facility], [address], [place], [place] [address]      Labs: BNP (last 3 results) No results for input(s): BNP in the last 8760 hours. Basic Metabolic Panel: Recent Labs  Lab 03/20/20 2201 03/21/20 0450  NA 138  --   K 3.6  --   CL 101  --   CO2 25  --   GLUCOSE 95  --   BUN 9  --   CREATININE 0.63  --   CALCIUM 9.5  --   MG  --  1.8  PHOS  --  4.1   Liver Function Tests: Recent Labs  Lab 03/20/20 2201  AST 43*  ALT 39  ALKPHOS 100  BILITOT 0.4  PROT 8.3*  ALBUMIN 4.0   No results for input(s): LIPASE, AMYLASE in the last 168 hours. No results for input(s): AMMONIA in the last 168 hours. CBC: Recent Labs  Lab 03/20/20 2201 03/20/20 2336  WBC 7.6 7.7  NEUTROABS  --  4.5  HGB 12.3 12.9  HCT 40.2 41.8  MCV 88.7 87.8  PLT 275 267   Cardiac Enzymes: No results for input(s): CKTOTAL, CKMB, CKMBINDEX, TROPONINI in the last 168 hours. BNP: Invalid input(s): POCBNP CBG: No  results for input(s): GLUCAP in the last 168 hours. D-Dimer No results for input(s): DDIMER in the last 72 hours. Hgb A1c Recent Labs    03/21/20 0450  HGBA1C 5.6   Lipid Profile Recent Labs    03/21/20 0450  CHOL 143  HDL 35*  LDLCALC 81  TRIG 161  CHOLHDL 4.1   Thyroid function studies No  results for input(s): TSH, T4TOTAL, T3FREE, THYROIDAB in the last 72 hours.  Invalid input(s): FREET3 Anemia work up No results for input(s): VITAMINB12, FOLATE, FERRITIN, TIBC, IRON, RETICCTPCT in the last 72 hours. Urinalysis    Component Value Date/Time   COLORURINE YELLOW 03/20/2020 2103   APPEARANCEUR CLOUDY (A) 03/20/2020 2103   LABSPEC 1.029 03/20/2020 2103   PHURINE 5.0 03/20/2020 2103   GLUCOSEU NEGATIVE 03/20/2020 2103   HGBUR NEGATIVE 03/20/2020 2103   BILIRUBINUR NEGATIVE 03/20/2020 2103   KETONESUR NEGATIVE 03/20/2020 2103   PROTEINUR 100 (A) 03/20/2020 2103   UROBILINOGEN 0.2 02/23/2013 1447   NITRITE NEGATIVE 03/20/2020 2103   LEUKOCYTESUR SMALL (A) 03/20/2020 2103   Sepsis Labs Invalid input(s): PROCALCITONIN,  WBC,  LACTICIDVEN Microbiology Recent Results (from the past 240 hour(s))  Urine culture     Status: Abnormal   Collection Time: 03/20/20 10:39 PM   Specimen: Urine, Clean Catch  Result Value Ref Range Status   Specimen Description   Final    URINE, CLEAN CATCH Performed at [facility] [address] [place], [place] [address]    Special Requests   Final    NONE Performed at [facility], [address] [place], [place] [address]    Culture 60,000 COLONIES/mL ESCHERICHIA COLI (A)  Final   Report Status 03/22/2020 FINAL  Final   Organism ID, Bacteria ESCHERICHIA COLI (A)  Final      Susceptibility   Escherichia coli - MIC*    AMPICILLIN <=2 SENSITIVE Sensitive     CEFAZOLIN <=4 SENSITIVE Sensitive     CEFTRIAXONE <=0.25 SENSITIVE Sensitive     CIPROFLOXACIN <=0.25 SENSITIVE Sensitive     GENTAMICIN <=1 SENSITIVE Sensitive     IMIPENEM <=0.25 SENSITIVE Sensitive     NITROFURANTOIN <=16 SENSITIVE Sensitive     TRIMETH/SULFA <=20 SENSITIVE Sensitive     AMPICILLIN/SULBACTAM <=2 SENSITIVE Sensitive     PIP/TAZO <=4 SENSITIVE Sensitive     * 60,000 COLONIES/mL ESCHERICHIA COLI  SARS Coronavirus 2 by RT PCR  (hospital order, performed in [facility] hospital lab) Nasopharyngeal Nasopharyngeal Swab     Status: None   Collection Time: 03/20/20 11:07 PM   Specimen: Nasopharyngeal Swab  Result Value Ref Range Status   SARS Coronavirus 2 NEGATIVE NEGATIVE Final    Comment: (NOTE) SARS-CoV-2 target nucleic acids are NOT DETECTED.  The SARS-CoV-2 RNA is generally detectable in upper and lower respiratory specimens during the acute phase of infection. The lowest concentration of SARS-CoV-2 viral copies this assay can detect is 250 copies / mL. A negative result does not preclude SARS-CoV-2 infection and should not be used as the sole basis for treatment or other patient management decisions.  A negative result may occur with improper specimen collection / handling, submission of specimen other than nasopharyngeal swab, presence of viral mutation(s) within the areas targeted by this assay, and inadequate number of viral copies (<250 copies / mL). A negative result must be combined with clinical observations, patient history, and epidemiological information.  Fact Sheet for Patients:   BoilerBrush.com.cy  Fact Sheet for Healthcare Providers: https://pope.com/  This test is not yet approved or  cleared by the [place] and has been authorized for detection and/or diagnosis of SARS-CoV-2 by FDA under an Emergency Use Authorization (EUA).  This EUA will remain in effect (meaning this test can be used) for the duration of the COVID-19 declaration under Section 564(b)(1) of the Act, 21 U.S.C. section 360bbb-3(b)(1), unless the authorization is terminated or revoked sooner.  Performed at [facility], [address] [place], [place] [address]      Time coordinating discharge: 40 minutes  SIGNED:   Alba Cory, MD  [facility]

## 2020-03-22 NOTE — Progress Notes (Signed)
Pt continues to refuse blood pressure to be taken. Pt also continues to take on heart monitoring leads and O2 sensor. RN has attempted to take blood pressure on different limbs and pt rips off blood pressure cuff and screams in pain and yells at RN. RN continues to educate and reeducate pt on importance of blood pressure being monitored. Will continue to monitor pt.

## 2020-04-10 ENCOUNTER — Encounter (HOSPITAL_BASED_OUTPATIENT_CLINIC_OR_DEPARTMENT_OTHER): Payer: Self-pay

## 2020-04-10 ENCOUNTER — Other Ambulatory Visit: Payer: Self-pay

## 2020-04-10 ENCOUNTER — Emergency Department (HOSPITAL_BASED_OUTPATIENT_CLINIC_OR_DEPARTMENT_OTHER)
Admission: EM | Admit: 2020-04-10 | Discharge: 2020-04-10 | Disposition: A | Discharge status: Home / Self Care | Payer: Self-pay | Attending: Emergency Medicine | Admitting: Emergency Medicine

## 2020-04-10 DIAGNOSIS — F17210 Nicotine dependence, cigarettes, uncomplicated: Secondary | ICD-10-CM | POA: Insufficient documentation

## 2020-04-10 DIAGNOSIS — R112 Nausea with vomiting, unspecified: Secondary | ICD-10-CM

## 2020-04-10 DIAGNOSIS — I10 Essential (primary) hypertension: Secondary | ICD-10-CM | POA: Insufficient documentation

## 2020-04-10 DIAGNOSIS — Z79899 Other long term (current) drug therapy: Secondary | ICD-10-CM | POA: Insufficient documentation

## 2020-04-10 LAB — COMPREHENSIVE METABOLIC PANEL
ALT: 50 U/L — ABNORMAL HIGH (ref 0–44)
AST: 69 U/L — ABNORMAL HIGH (ref 15–41)
Albumin: 4.4 g/dL (ref 3.5–5.0)
Alkaline Phosphatase: 100 U/L (ref 38–126)
Anion gap: 14 (ref 5–15)
BUN: 12 mg/dL (ref 6–20)
CO2: 21 mmol/L — ABNORMAL LOW (ref 22–32)
Calcium: 9.6 mg/dL (ref 8.9–10.3)
Chloride: 103 mmol/L (ref 98–111)
Creatinine, Ser: 0.82 mg/dL (ref 0.44–1.00)
GFR calc Af Amer: >60 mL/min (ref >60)
GFR calc non Af Amer: >60 mL/min (ref >60)
Glucose, Bld: 114 mg/dL — ABNORMAL HIGH (ref 70–99)
Potassium: 4.4 mmol/L (ref 3.5–5.1)
Sodium: 138 mmol/L (ref 135–145)
Total Bilirubin: 0.4 mg/dL (ref 0.3–1.2)
Total Protein: 8.2 g/dL — ABNORMAL HIGH (ref 6.5–8.1)

## 2020-04-10 LAB — CBC
HCT: 41 % (ref 36.0–46.0)
Hemoglobin: 12.7 g/dL (ref 12.0–15.0)
MCH: 26.8 pg (ref 26.0–34.0)
MCHC: 31 g/dL (ref 30.0–36.0)
MCV: 86.5 fL (ref 80.0–100.0)
Platelets: 289 10*3/uL (ref 150–400)
RBC: 4.74 MIL/uL (ref 3.87–5.11)
RDW: 18.2 % — ABNORMAL HIGH (ref 11.5–15.5)
WBC: 10.8 10*3/uL — ABNORMAL HIGH (ref 4.0–10.5)
nRBC: 0 % (ref 0.0–0.2)

## 2020-04-10 LAB — LIPASE, BLOOD: Lipase: 34 U/L (ref 11–51)

## 2020-04-10 LAB — PREGNANCY, URINE: Preg Test, Ur: NEGATIVE

## 2020-04-10 LAB — ETHANOL: Alcohol, Ethyl (B): <10 mg/dL (ref <10)

## 2020-04-10 MED ORDER — ONDANSETRON 4 MG PO TBDP
4.0000 mg | ORAL_TABLET | Freq: Once | ORAL | Status: AC | PRN
  Administered 2020-04-10: 4 mg via ORAL
  Filled 2020-04-10: qty 1

## 2020-04-10 MED ORDER — ALPRAZOLAM 0.5 MG PO TABS
0.5000 mg | ORAL_TABLET | Freq: Once | ORAL | Status: AC
  Administered 2020-04-10: 0.5 mg via ORAL
  Filled 2020-04-10: qty 1

## 2020-04-10 NOTE — ED Provider Notes (Signed)
[facility] EMERGENCY DEPARTMENT Provider Note   CSN: [id number] Arrival date & time: 04/10/20  1229     History Chief Complaint  Patient presents with  . Emesis    [name] is a 34 y.o. female.  Patient presents to the ED via EMS for nausea and vomiting.  She reports waking up this morning and could only hear muffled sounds and thought she was having a stroke. She was recently admitted for TIA at [facility] but left AMA.   Reports vomiting x2-3 times NBNB emesis earlier this morning.  She reports that she drank 40 oz ETOH last night and drinks daily.  She denies any nausea or vomiting since arrival.  Denies any fevers, headaches, dizziness or visual disturbances, no weakness, numbness or tingling.  Reports no abdominal pain, diarrhea or urinary problems.           Past Medical History:  Diagnosis Date  . Anxiety   . Cirrhosis of liver (HCC)   . Cocaine use 08/04/2018  . History of cocaine use 08/04/2018  . Hypertension   . Polysubstance abuse (HCC)   . Renal disorder    Kidney Infection     Patient Active Problem List   Diagnosis Date Noted  . Acute CVA (cerebrovascular accident) (HCC) 03/21/2020  . Hypertensive urgency 03/21/2020  . Bronchitis 03/21/2020  . Asymptomatic bacteriuria 03/21/2020  . Acute respiratory failure with hypoxia (HCC) 08/01/2017  . Substance abuse (HCC) 08/01/2017  . Constipation 08/01/2017  . Anemia 08/01/2017  . Rectal bleeding 08/01/2017  . Alcohol withdrawal (HCC) 07/31/2017  . Ventral hernia 10/04/2016  . Incarcerated ventral hernia 10/03/2016  . Anxiety 02/15/2014  . Hypertension 02/15/2014    Past Surgical History:  Procedure Laterality Date  . ARTERY REPAIR    . CESAREAN SECTION     x 5  . MOUTH SURGERY    . TEAR DUCT PROBING    . TUBAL LIGATION    . VENTRAL HERNIA REPAIR N/A 10/03/2016   Procedure: OPEN INCARCERATED HERNIA REPAIR VENTRAL ADULT;  Surgeon: Axel Filler, MD;  Location: [facility] OR;  Service:  General;  Laterality: N/A;     OB History   No obstetric history on file.     Family History  Problem Relation Age of Onset  . CAD Other   . Diabetes Other   . Hypertension Other   . Cancer Other   . Thyroid disease Other     Social History   Tobacco Use  . Smoking status: Current Every Day Smoker    Packs/day: 0.50    Types: Cigarettes  . Smokeless tobacco: Never Used  Vaping Use  . Vaping Use: Never used  Substance Use Topics  . Alcohol use: Yes    Comment: daily  . Drug use: Yes    Types: Cocaine    Home Medications Prior to Admission medications   Medication Sig Start Date End Date Taking? Authorizing Provider  acetaminophen (TYLENOL) 325 MG tablet Take 650 mg by mouth every 6 (six) hours as needed for mild pain or headache.    [provider]  albuterol (PROVENTIL HFA;VENTOLIN HFA) 108 (90 Base) MCG/ACT inhaler Inhale 1-2 puffs into the lungs every 6 (six) hours as needed for wheezing or shortness of breath. Patient not taking: Reported on 03/21/2020 09/19/17   Demetrios Loll T, PA-C  chlordiazePOXIDE (LIBRIUM) 25 MG capsule Take 1 capsule (25 mg total) by mouth 3 (three) times daily as needed for withdrawal. Patient not taking: Reported on  03/21/2020 08/04/18   Felicie Morn, NP  cloNIDine (CATAPRES) 0.1 MG tablet Take 1 tablet (0.1 mg total) by mouth 2 (two) times daily. Patient not taking: Reported on 08/04/2018 05/01/17   Elpidio Anis, PA-C  cloNIDine (CATAPRES) 0.1 MG tablet Take 1 tablet (0.1 mg total) by mouth daily. Patient not taking: Reported on 03/21/2020 08/04/18   Felicie Morn, NP  diazepam (VALIUM) 5 MG tablet Take 0.5-1 tablets (2.5-5 mg total) by mouth every 6 (six) hours as needed for anxiety. Patient not taking: Reported on 09/19/2017 08/05/17   Alison [place], MD  folic acid (FOLVITE) 1 MG tablet Take 1 tablet (1 mg total) by mouth daily. Patient not taking: Reported on 09/19/2017 08/05/17   Alison [place], MD  ibuprofen (ADVIL) 200 MG  tablet Take 400 mg by mouth every 6 (six) hours as needed for headache or mild pain.    [provider]  methocarbamol (ROBAXIN) 500 MG tablet Take 1 tablet (500 mg total) by mouth 2 (two) times daily. Patient not taking: Reported on 08/04/2018 09/19/17   Demetrios Loll T, PA-C  metoCLOPramide (REGLAN) 10 MG tablet Take 1 tablet (10 mg total) by mouth every 8 (eight) hours as needed for nausea. Patient not taking: Reported on 03/21/2020 08/04/18   Felicie Morn, NP  Multiple Vitamin (MULTIVITAMIN WITH MINERALS) TABS tablet Take 1 tablet by mouth daily. Patient not taking: Reported on 09/19/2017 08/05/17   Alison [place], MD  omeprazole (PRILOSEC) 40 MG capsule Take 1 capsule (40 mg total) by mouth daily. Patient not taking: Reported on 03/21/2020 09/03/17   Ward, Layla Maw, DO  ondansetron (ZOFRAN ODT) 4 MG disintegrating tablet Take 1 tablet (4 mg total) by mouth every 8 (eight) hours as needed for nausea or vomiting. Patient not taking: Reported on 03/21/2020 09/03/17   Ward, Layla Maw, DO  predniSONE (DELTASONE) 20 MG tablet Take 3 tablets (60 mg total) by mouth daily. Patient not taking: Reported on 09/19/2017 09/03/17   Ward, Layla Maw, DO  propranolol (INDERAL) 40 MG tablet Take 1 tablet (40 mg total) by mouth 2 (two) times daily. Patient not taking: Reported on 08/04/2018 05/01/17   Elpidio Anis, PA-C  thiamine (VITAMIN B-1) 100 MG tablet Take 1 tablet (100 mg total) by mouth daily. Patient not taking: Reported on 09/19/2017 09/03/17   Ward, Layla Maw, DO    Allergies    Ibuprofen, Peanut-containing drug products, Penicillins, Acetaminophen, Codeine, Lisinopril, and Ativan [lorazepam]  Review of Systems   Review of Systems  HENT: Positive for hearing loss.   Respiratory: Positive for cough. Negative for chest tightness and shortness of breath.   Cardiovascular: Negative for chest pain.  Gastrointestinal: Positive for nausea and vomiting. Negative for abdominal pain, blood in stool  and diarrhea.  Genitourinary: Negative for hematuria.  Neurological: Negative for dizziness, seizures, facial asymmetry, speech difficulty, weakness, numbness and headaches.  Psychiatric/Behavioral: Negative for hallucinations and suicidal ideas. The patient is nervous/anxious.     Physical Exam Updated Vital Signs BP (!) 168/98 (BP Location: Left Arm)   Pulse 91   Temp 98.7 F (37.1 C) (Oral)   Resp 20   LMP 04/03/2020   SpO2 99%   Physical Exam Constitutional:      General: She is in acute distress.     Appearance: She is not diaphoretic.  HENT:     Head: Normocephalic.     Right Ear: Tympanic membrane, ear canal and external ear normal.     Left Ear: Tympanic membrane,  ear canal and external ear normal.     Nose: Nose normal.     Mouth/Throat:     Mouth: Mucous membranes are moist.  Eyes:     Extraocular Movements: Extraocular movements intact.     Conjunctiva/sclera: Conjunctivae normal.     Pupils: Pupils are equal, round, and reactive to light.  Cardiovascular:     Rate and Rhythm: Normal rate and regular rhythm.     Pulses: Normal pulses.     Heart sounds: Normal heart sounds.  Pulmonary:     Effort: Pulmonary effort is normal.     Breath sounds: Wheezing present.  Chest:     Chest wall: No tenderness.  Abdominal:     General: Bowel sounds are normal. There is no distension.     Palpations: Abdomen is soft.     Tenderness: There is no abdominal tenderness. There is no right CVA tenderness, left CVA tenderness, guarding or rebound.  Musculoskeletal:        General: Normal range of motion.     Cervical back: Normal range of motion.  Skin:    General: Skin is warm and dry.     Capillary Refill: Capillary refill takes less than 2 seconds.  Neurological:     General: No focal deficit present.     Mental Status: She is alert and oriented to person, place, and time.     Sensory: No sensory deficit.     Motor: No weakness.     Coordination: Coordination normal.       ED Results / Procedures / Treatments   Labs (all labs ordered are listed, but only abnormal results are displayed) Labs Reviewed  COMPREHENSIVE METABOLIC PANEL - Abnormal; Notable for the following components:      Result Value   CO2 21 (*)    Glucose, Bld 114 (*)    Total Protein 8.2 (*)    AST 69 (*)    ALT 50 (*)    All other components within normal limits  CBC - Abnormal; Notable for the following components:   WBC 10.8 (*)    RDW 18.2 (*)    All other components within normal limits  LIPASE, BLOOD  PREGNANCY, URINE  ETHANOL    EKG None  Radiology No results found.  Procedures Procedures (including critical care time)  Medications Ordered in ED Medications  ondansetron (ZOFRAN-ODT) disintegrating tablet 4 mg (4 mg Oral Given 04/10/20 1313)  ALPRAZolam (XANAX) tablet 0.5 mg (0.5 mg Oral Given 04/10/20 1931)    ED Course  I have reviewed the triage vital signs and the nursing notes.  Pertinent labs & imaging results that were available during my care of the patient were reviewed by me and considered in my medical decision making (see chart for details).    MDM Rules/Calculators/A&P                          [name] is a 34 y.o. female who presented to the ED for nausea and vomiting. Vital signs stable.  Labs significant for mild elevated AST/ALT, and mild hypochloremia otherwise electrolytes wnl.  Abdominal and Neuro exam benign.  Likely intractable vomiting secondary to ETOH use previous night.  Less likely TIA/CVA given no focal deficits on exam.  Will give Xanax 0.5mg  x 1 and po challenge.    Patient tolerating food and liquid without nausea or vomiting.  States feeling much better.  Requested prescription for 2 weeks Xanax.  Advise to follow up with PCP.  Mental health and addictions resources provided.  Patient stable for discharge. Final Clinical Impression(s) / ED Diagnoses Final diagnoses:  Nausea and vomiting, intractability of vomiting  not specified, unspecified vomiting type    Rx / DC Orders ED Discharge Orders    None       Dana Allan, MD 04/12/20 1830    Maia Plan, MD 04/15/20 [phone]

## 2020-04-10 NOTE — ED Triage Notes (Addendum)
[facility] report-pt with n/v, panic attack x this am-pt with ETOH x 2 days-no meds given en route-pt was started on O2 with sat pre O2 95 post O2 96-pt c/o n/v x this am-pt c/o lower back pain x 2 days-to triage in w/c-falling asleep in between answering ?s/pt answering all ?s angrily-pt dry heaving then vomiting-requested drink-pt angry when advised of NPO status-began crying and requesting to speak to another nurse that is not "mean"-M Simms, RN spoke with pt and gave her ice chips

## 2020-04-10 NOTE — Discharge Instructions (Signed)
Substance Abuse Treatment Programs ° °Intensive Outpatient Programs °[facility]     °[address]      °[place], [place]                   °[phone]      ° °[facility] °[address] °[place], [place] °[phone] ° °[facility]     °(Inpatient and outpatient)     °[address]           °[phone]   ° °[facility] °[phone] (Suboxone and Methadone) ° °[address]      °[place], [place] [address]      °[phone]      ° °[address] °[place], [place] °[phone] ° °[facility] (Outpatient/Inpatient, Chemical)    °(insurance only) [phone]      °       °[facility] (Groups & Residential) °[place], [place] °[phone] ° °   °[facility]     °[address]     °[place], [place]      °[phone]      ° °[facility] (for caregivers and family) °[address] °[place], [place] °[phone] ° ° ° ° ° °Residential Treatment Programs °[facility]      °[address], [place], [place] [address]  °[phone]      ° °[facility] °[address] [place], [place] [address] °[phone] ° °[facility]        °[phone]      ° °[facility] °[phone] ° °[facility]             °[address]                                         °[place], [place]                                                °[phone] or [phone]                              ° °[facility] [place] °[address] °[place], [address] °[phone] ° °[facility]    °[address]      °[place], [place]     °[phone]      ° °[facility] °[address] °[place], [place] °[phone] ° °[facility]   °[address]     °[place], [place] [address]     °[phone]      °Admissions: 8am-3pm M-F ° °[facility] °[address] °[place],  [place] °[phone] ° °[facility]: Residential Program (90 Days)   °[place], [place]      °336-725-8389 or [phone]    ° °[facility] °[place], [place] °(Walk in Hours over the weekend or by referral) ° °[facility] °[address], [place],  [address] °([phone] ° °Crisis Mobile: [facility]:  1-877-626-1772 (for crisis response 24 hours a day) °[facility] Hotline:      1-800-256-2452 °Outpatient Psychiatry and Counseling ° °[facility]: Mobile Crisis   Management 24 hours:  1-877-626-1772 ° °[facility] sliding scale fee and walk in schedule: M-F 8am-12pm/1pm-3pm °[address]  °[place], [place] [address] °[phone] ° °[facility] °[address] °[place], [place] [address] °[phone] ° °[facility] ([facility] known [facility])- new patient walk-in appointments available Monday - Friday 8am -3pm.          °[address] °[place], [place] [address] °336-676-6840 or crisis line- 336-676-6905 ° °[facility] Intensive Outpatient Therapy Program °[address] °[place], [place] [address] °336-832-9804 ° °[facility]                  °Crisis Services      °[phone]      °[address]     °[place], [place] [address]                ° °[facility]   °[facility] °[phone] °[address] °[place], [place] [address] ° ° °[facility]          °[address]  °[place], [place] [address]       °[phone] ° °[facility] °[address] °[place], [place] [address] °336-292-1510 ° °[facility]    °[address]    °[place], [place] [address]     °[phone]      ° °Parish McKinney, MD     °[address]     °[place] [address]     °[phone]     °  °[facility] °[address] °[place] [address] ° °[facility]     °[address]     °[place], [place]      °336-542-2076      ° °[facility] °Shamsher Ahluwalia, MD °[address] °[place], [place] [address] °[phone] ° °[facility]     °[address]     °[place], [place] [address]     °336-274-1237      ° °[facility] °[address] °[place], [place] [address] °336-854-4450 °Resources for Temporary Residential Assistance/Crisis Centers ° °DAY CENTERS °[facility] °M-F 8am-3pm   °[address] [place], [place] [address]   336-332-0824 °Services include: laundry, barbering, support groups, case management, phone  & computer access, showers, AA/NA mtgs, mental health/substance abuse nurse, job skills class, disability information, VA assistance, spiritual classes, etc.  ° °HOMELESS SHELTERS ° °[facility]     °[facility]   °[address], [place]     °[phone]       °       °[facility] (women and children)       °[address] °[place], [place] [address] °336-275-0820 °[email or web address] for application and process °Application Required ° °[facility]   °[address]    °[place] [address]     °[phone]       °             °[facility] °[address] °,  [address] °[phone] °336-235-0363(schedule application appt.) °Application Required ° °[facility] (women only)    °[address]     °[place],  [address]     °336-884-1039      °  Intake starts 6pm daily °Need valid ID, SSC, & Police report °[facility] [place] °[address] °[place], [place] °[phone] °Application Required ° °[facility] (men only)     °[address]      °[place], [place]     °[phone]      ° °[facility] °(Pregnant women only) °[address] °[place], [place] °336-275-0206 ° °[facility]      °[address]      °[place], [place] [address]     °[phone]      °       °[facility] °[address] °[place], [place] °336-723-1848 °90 day commitment/SA/Application process ° °[facility](men only)     °[address]     °[place], [place]     °[phone]       °Check-in at 7pm     °       °[facility] °[address] °[place], [place] [address] °336-248-6684 °Men/Women/Women and Children must be there by 7 pm ° °[facility] °[place], [place] °336-722-8721                ° °

## 2020-04-10 NOTE — ED Notes (Signed)
Pt was made aware again that we need a urine sample

## 2020-05-09 ENCOUNTER — Other Ambulatory Visit: Payer: Self-pay

## 2020-05-09 ENCOUNTER — Encounter (HOSPITAL_COMMUNITY): Payer: Self-pay

## 2020-05-09 ENCOUNTER — Emergency Department (HOSPITAL_COMMUNITY)
Admission: EM | Admit: 2020-05-09 | Discharge: 2020-05-09 | Disposition: A | Discharge status: Home / Self Care | Payer: Self-pay | Attending: Emergency Medicine | Admitting: Emergency Medicine

## 2020-05-09 DIAGNOSIS — Z79899 Other long term (current) drug therapy: Secondary | ICD-10-CM | POA: Insufficient documentation

## 2020-05-09 DIAGNOSIS — F250 Schizoaffective disorder, bipolar type: Secondary | ICD-10-CM | POA: Insufficient documentation

## 2020-05-09 DIAGNOSIS — Z20822 Contact with and (suspected) exposure to covid-19: Secondary | ICD-10-CM | POA: Insufficient documentation

## 2020-05-09 DIAGNOSIS — F22 Delusional disorders: Secondary | ICD-10-CM | POA: Insufficient documentation

## 2020-05-09 LAB — COMPREHENSIVE METABOLIC PANEL
ALT: 42 U/L (ref 0–44)
AST: 42 U/L — ABNORMAL HIGH (ref 15–41)
Albumin: 4.1 g/dL (ref 3.5–5.0)
Alkaline Phosphatase: 98 U/L (ref 38–126)
Anion gap: 15 (ref 5–15)
BUN: 7 mg/dL (ref 6–20)
CO2: 20 mmol/L — ABNORMAL LOW (ref 22–32)
Calcium: 9.2 mg/dL (ref 8.9–10.3)
Chloride: 102 mmol/L (ref 98–111)
Creatinine, Ser: 0.55 mg/dL (ref 0.44–1.00)
GFR calc Af Amer: >60 mL/min (ref >60)
GFR calc non Af Amer: >60 mL/min (ref >60)
Glucose, Bld: 109 mg/dL — ABNORMAL HIGH (ref 70–99)
Potassium: 3.6 mmol/L (ref 3.5–5.1)
Sodium: 137 mmol/L (ref 135–145)
Total Bilirubin: 0.5 mg/dL (ref 0.3–1.2)
Total Protein: 8 g/dL (ref 6.5–8.1)

## 2020-05-09 LAB — CBC
HCT: 45.8 % (ref 36.0–46.0)
Hemoglobin: 14.1 g/dL (ref 12.0–15.0)
MCH: 27.4 pg (ref 26.0–34.0)
MCHC: 30.8 g/dL (ref 30.0–36.0)
MCV: 88.9 fL (ref 80.0–100.0)
Platelets: 261 10*3/uL (ref 150–400)
RBC: 5.15 MIL/uL — ABNORMAL HIGH (ref 3.87–5.11)
RDW: 16.5 % — ABNORMAL HIGH (ref 11.5–15.5)
WBC: 9.6 10*3/uL (ref 4.0–10.5)
nRBC: 0 % (ref 0.0–0.2)

## 2020-05-09 LAB — LIPASE, BLOOD: Lipase: 33 U/L (ref 11–51)

## 2020-05-09 LAB — I-STAT BETA HCG BLOOD, ED (MC, WL, AP ONLY): I-stat hCG, quantitative: <5 m[IU]/mL (ref <5)

## 2020-05-09 NOTE — ED Provider Notes (Signed)
Patient was slated for bed but did not appear - may have eloped.  She was not seen by myself   Terald Sleeper, MD 05/09/20 [phone]

## 2020-05-09 NOTE — ED Triage Notes (Signed)
Patient arrived with complaints of generalized abdominal pain that started 5 days ago, reports NVD. Patient given of Fentanyl and 4mg  Zofran prior to arrival. After Fentanyl admin, patients eyes were pinpoint and respirations decreased, given 0.5mg  Narcan and patient returned to baseline. Patient admits to heroin use yesterday.

## 2022-05-01 ENCOUNTER — Emergency Department (HOSPITAL_COMMUNITY): Payer: Self-pay

## 2022-05-01 ENCOUNTER — Encounter (HOSPITAL_COMMUNITY): Payer: Self-pay

## 2022-05-01 ENCOUNTER — Other Ambulatory Visit: Payer: Self-pay

## 2022-05-01 ENCOUNTER — Inpatient Hospital Stay (HOSPITAL_COMMUNITY)
Admission: EM | Admit: 2022-05-01 | Discharge: 2022-05-11 | DRG: 304 | Discharge status: Against Medical Advice | Payer: Self-pay | Attending: Internal Medicine | Admitting: Internal Medicine

## 2022-05-01 DIAGNOSIS — E876 Hypokalemia: Secondary | ICD-10-CM

## 2022-05-01 DIAGNOSIS — Z5329 Procedure and treatment not carried out because of patient's decision for other reasons: Secondary | ICD-10-CM | POA: Diagnosis present

## 2022-05-01 DIAGNOSIS — E669 Obesity, unspecified: Secondary | ICD-10-CM | POA: Diagnosis present

## 2022-05-01 DIAGNOSIS — F10239 Alcohol dependence with withdrawal, unspecified: Secondary | ICD-10-CM | POA: Diagnosis present

## 2022-05-01 DIAGNOSIS — F17210 Nicotine dependence, cigarettes, uncomplicated: Secondary | ICD-10-CM | POA: Diagnosis present

## 2022-05-01 DIAGNOSIS — N739 Female pelvic inflammatory disease, unspecified: Secondary | ICD-10-CM

## 2022-05-01 DIAGNOSIS — R112 Nausea with vomiting, unspecified: Secondary | ICD-10-CM | POA: Diagnosis present

## 2022-05-01 DIAGNOSIS — F1410 Cocaine abuse, uncomplicated: Secondary | ICD-10-CM

## 2022-05-01 DIAGNOSIS — K5090 Crohn's disease, unspecified, without complications: Secondary | ICD-10-CM | POA: Diagnosis present

## 2022-05-01 DIAGNOSIS — Z885 Allergy status to narcotic agent status: Secondary | ICD-10-CM

## 2022-05-01 DIAGNOSIS — Z888 Allergy status to other drugs, medicaments and biological substances status: Secondary | ICD-10-CM

## 2022-05-01 DIAGNOSIS — I160 Hypertensive urgency: Secondary | ICD-10-CM | POA: Diagnosis present

## 2022-05-01 DIAGNOSIS — Z91010 Allergy to peanuts: Secondary | ICD-10-CM

## 2022-05-01 DIAGNOSIS — R109 Unspecified abdominal pain: Secondary | ICD-10-CM | POA: Diagnosis present

## 2022-05-01 DIAGNOSIS — R29702 NIHSS score 2: Secondary | ICD-10-CM | POA: Diagnosis not present

## 2022-05-01 DIAGNOSIS — K760 Fatty (change of) liver, not elsewhere classified: Secondary | ICD-10-CM | POA: Diagnosis present

## 2022-05-01 DIAGNOSIS — Z880 Allergy status to penicillin: Secondary | ICD-10-CM

## 2022-05-01 DIAGNOSIS — F1910 Other psychoactive substance abuse, uncomplicated: Secondary | ICD-10-CM | POA: Diagnosis present

## 2022-05-01 DIAGNOSIS — F1020 Alcohol dependence, uncomplicated: Secondary | ICD-10-CM

## 2022-05-01 DIAGNOSIS — I161 Hypertensive emergency: Secondary | ICD-10-CM | POA: Diagnosis present

## 2022-05-01 DIAGNOSIS — Z79899 Other long term (current) drug therapy: Secondary | ICD-10-CM

## 2022-05-01 DIAGNOSIS — Z91148 Patient's other noncompliance with medication regimen for other reason: Secondary | ICD-10-CM

## 2022-05-01 DIAGNOSIS — I639 Cerebral infarction, unspecified: Secondary | ICD-10-CM | POA: Diagnosis not present

## 2022-05-01 DIAGNOSIS — I169 Hypertensive crisis, unspecified: Principal | ICD-10-CM | POA: Diagnosis present

## 2022-05-01 DIAGNOSIS — Z6831 Body mass index (BMI) 31.0-31.9, adult: Secondary | ICD-10-CM

## 2022-05-01 DIAGNOSIS — Z560 Unemployment, unspecified: Secondary | ICD-10-CM

## 2022-05-01 DIAGNOSIS — K7460 Unspecified cirrhosis of liver: Secondary | ICD-10-CM | POA: Diagnosis present

## 2022-05-01 DIAGNOSIS — K651 Peritoneal abscess: Secondary | ICD-10-CM | POA: Diagnosis present

## 2022-05-01 DIAGNOSIS — I10 Essential (primary) hypertension: Secondary | ICD-10-CM | POA: Diagnosis present

## 2022-05-01 DIAGNOSIS — Z8379 Family history of other diseases of the digestive system: Secondary | ICD-10-CM

## 2022-05-01 DIAGNOSIS — G8191 Hemiplegia, unspecified affecting right dominant side: Secondary | ICD-10-CM | POA: Diagnosis not present

## 2022-05-01 DIAGNOSIS — F10939 Alcohol use, unspecified with withdrawal, unspecified: Secondary | ICD-10-CM | POA: Diagnosis present

## 2022-05-01 DIAGNOSIS — Z886 Allergy status to analgesic agent status: Secondary | ICD-10-CM

## 2022-05-01 LAB — URINALYSIS, ROUTINE W REFLEX MICROSCOPIC
Bilirubin Urine: NEGATIVE
Glucose, UA: NEGATIVE mg/dL
Hgb urine dipstick: NEGATIVE
Ketones, ur: 20 mg/dL — AB
Nitrite: NEGATIVE
Protein, ur: 100 mg/dL — AB
Specific Gravity, Urine: 1.03 (ref 1.005–1.030)
pH: 5 (ref 5.0–8.0)

## 2022-05-01 LAB — COMPREHENSIVE METABOLIC PANEL
ALT: 19 U/L (ref 0–44)
AST: 17 U/L (ref 15–41)
Albumin: 4.4 g/dL (ref 3.5–5.0)
Alkaline Phosphatase: 97 U/L (ref 38–126)
Anion gap: 13 (ref 5–15)
BUN: 15 mg/dL (ref 6–20)
CO2: 22 mmol/L (ref 22–32)
Calcium: 9.6 mg/dL (ref 8.9–10.3)
Chloride: 97 mmol/L — ABNORMAL LOW (ref 98–111)
Creatinine, Ser: 0.78 mg/dL (ref 0.44–1.00)
GFR, Estimated: >60 mL/min (ref >60)
Glucose, Bld: 115 mg/dL — ABNORMAL HIGH (ref 70–99)
Potassium: 3.2 mmol/L — ABNORMAL LOW (ref 3.5–5.1)
Sodium: 132 mmol/L — ABNORMAL LOW (ref 135–145)
Total Bilirubin: 1.1 mg/dL (ref 0.3–1.2)
Total Protein: 9 g/dL — ABNORMAL HIGH (ref 6.5–8.1)

## 2022-05-01 LAB — CBC
HCT: 47.6 % — ABNORMAL HIGH (ref 36.0–46.0)
Hemoglobin: 16 g/dL — ABNORMAL HIGH (ref 12.0–15.0)
MCH: 30.1 pg (ref 26.0–34.0)
MCHC: 33.6 g/dL (ref 30.0–36.0)
MCV: 89.5 fL (ref 80.0–100.0)
Platelets: 199 10*3/uL (ref 150–400)
RBC: 5.32 MIL/uL — ABNORMAL HIGH (ref 3.87–5.11)
RDW: 14.3 % (ref 11.5–15.5)
WBC: 11.1 10*3/uL — ABNORMAL HIGH (ref 4.0–10.5)
nRBC: 0 % (ref 0.0–0.2)

## 2022-05-01 LAB — RAPID URINE DRUG SCREEN, HOSP PERFORMED
Amphetamines: NOT DETECTED
Barbiturates: NOT DETECTED
Benzodiazepines: NOT DETECTED
Cocaine: NOT DETECTED
Opiates: POSITIVE — AB
Tetrahydrocannabinol: NOT DETECTED

## 2022-05-01 LAB — MRSA NEXT GEN BY PCR, NASAL: MRSA by PCR Next Gen: NOT DETECTED

## 2022-05-01 LAB — PHOSPHORUS: Phosphorus: 3.5 mg/dL (ref 2.5–4.6)

## 2022-05-01 LAB — MAGNESIUM: Magnesium: 2.1 mg/dL (ref 1.7–2.4)

## 2022-05-01 LAB — LIPASE, BLOOD: Lipase: 39 U/L (ref 11–51)

## 2022-05-01 LAB — I-STAT BETA HCG BLOOD, ED (MC, WL, AP ONLY): I-stat hCG, quantitative: <5 m[IU]/mL (ref <5)

## 2022-05-01 MED ORDER — PANTOPRAZOLE SODIUM 40 MG IV SOLR
40.0000 mg | INTRAVENOUS | Status: DC
Start: 2022-05-01 — End: 2022-05-01

## 2022-05-01 MED ORDER — POTASSIUM CHLORIDE IN NACL 20-0.45 MEQ/L-% IV SOLN
INTRAVENOUS | Status: DC
  Filled 2022-05-01 (×2): qty 1000

## 2022-05-01 MED ORDER — FENTANYL CITRATE PF 50 MCG/ML IJ SOSY
50.0000 ug | PREFILLED_SYRINGE | Freq: Once | INTRAMUSCULAR | Status: AC
  Administered 2022-05-01: 50 ug via INTRAVENOUS
  Filled 2022-05-01: qty 1

## 2022-05-01 MED ORDER — PROPRANOLOL HCL 20 MG PO TABS: 40.0000 mg | ORAL_TABLET | Freq: Two times a day (BID) | ORAL | Status: DC

## 2022-05-01 MED ORDER — ONDANSETRON HCL 4 MG PO TABS
4.0000 mg | ORAL_TABLET | Freq: Four times a day (QID) | ORAL | Status: DC | PRN
  Administered 2022-05-07 – 2022-05-11 (×3): 4 mg via ORAL
  Filled 2022-05-01 (×3): qty 1

## 2022-05-01 MED ORDER — CHLORHEXIDINE GLUCONATE CLOTH 2 % EX PADS
6.0000 | MEDICATED_PAD | Freq: Every day | CUTANEOUS | Status: DC
  Administered 2022-05-01 – 2022-05-08 (×7): 6 via TOPICAL

## 2022-05-01 MED ORDER — ALUM & MAG HYDROXIDE-SIMETH 200-200-20 MG/5ML PO SUSP
15.0000 mL | Freq: Once | ORAL | Status: AC
  Administered 2022-05-02: 15 mL via ORAL
  Filled 2022-05-01: qty 30

## 2022-05-01 MED ORDER — ADULT MULTIVITAMIN W/MINERALS CH
1.0000 | ORAL_TABLET | Freq: Every day | ORAL | Status: DC
  Administered 2022-05-01 – 2022-05-11 (×11): 1 via ORAL
  Filled 2022-05-01 (×11): qty 1

## 2022-05-01 MED ORDER — ONDANSETRON HCL 4 MG/2ML IJ SOLN
4.0000 mg | Freq: Four times a day (QID) | INTRAMUSCULAR | Status: DC | PRN
  Administered 2022-05-09 – 2022-05-10 (×2): 4 mg via INTRAVENOUS
  Filled 2022-05-01 (×4): qty 2

## 2022-05-01 MED ORDER — ORAL CARE MOUTH RINSE: 15.0000 mL | OROMUCOSAL | Status: DC | PRN

## 2022-05-01 MED ORDER — LACTATED RINGERS IV SOLN: INTRAVENOUS | Status: DC

## 2022-05-01 MED ORDER — HYDRALAZINE HCL 20 MG/ML IJ SOLN
5.0000 mg | Freq: Once | INTRAMUSCULAR | Status: AC
  Administered 2022-05-01: 5 mg via INTRAVENOUS
  Filled 2022-05-01: qty 1

## 2022-05-01 MED ORDER — MORPHINE SULFATE (PF) 4 MG/ML IV SOLN
6.0000 mg | Freq: Once | INTRAVENOUS | Status: AC
  Administered 2022-05-01: 6 mg via INTRAVENOUS
  Filled 2022-05-01: qty 2

## 2022-05-01 MED ORDER — THIAMINE MONONITRATE 100 MG PO TABS
100.0000 mg | ORAL_TABLET | Freq: Every day | ORAL | Status: DC
  Administered 2022-05-01 – 2022-05-11 (×11): 100 mg via ORAL
  Filled 2022-05-01 (×11): qty 1

## 2022-05-01 MED ORDER — CLONIDINE HCL 0.1 MG PO TABS
0.1000 mg | ORAL_TABLET | Freq: Two times a day (BID) | ORAL | Status: DC
Start: 2022-05-01 — End: 2022-05-01

## 2022-05-01 MED ORDER — DIPHENHYDRAMINE HCL 50 MG/ML IJ SOLN
12.5000 mg | Freq: Once | INTRAMUSCULAR | Status: AC
  Administered 2022-05-01: 12.5 mg via INTRAVENOUS
  Filled 2022-05-01: qty 1

## 2022-05-01 MED ORDER — LACTATED RINGERS IV BOLUS
2000.0000 mL | Freq: Once | INTRAVENOUS | Status: AC
  Administered 2022-05-01: 2000 mL via INTRAVENOUS

## 2022-05-01 MED ORDER — FOLIC ACID 1 MG PO TABS
1.0000 mg | ORAL_TABLET | Freq: Every day | ORAL | Status: DC
  Administered 2022-05-01 – 2022-05-11 (×11): 1 mg via ORAL
  Filled 2022-05-01 (×11): qty 1

## 2022-05-01 MED ORDER — POTASSIUM CHLORIDE 10 MEQ/100ML IV SOLN
10.0000 meq | INTRAVENOUS | Status: AC
  Administered 2022-05-01 (×4): 10 meq via INTRAVENOUS
  Filled 2022-05-01 (×4): qty 100

## 2022-05-01 MED ORDER — METOCLOPRAMIDE HCL 5 MG/ML IJ SOLN
10.0000 mg | Freq: Four times a day (QID) | INTRAMUSCULAR | Status: DC
  Administered 2022-05-01 – 2022-05-11 (×40): 10 mg via INTRAVENOUS
  Filled 2022-05-01 (×41): qty 2

## 2022-05-01 MED ORDER — HYDRALAZINE HCL 20 MG/ML IJ SOLN: 5.0000 mg | Freq: Once | INTRAMUSCULAR | Status: DC

## 2022-05-01 MED ORDER — HYDRALAZINE HCL 20 MG/ML IJ SOLN
10.0000 mg | Freq: Once | INTRAMUSCULAR | Status: AC
  Administered 2022-05-01: 10 mg via INTRAVENOUS
  Filled 2022-05-01: qty 1

## 2022-05-01 MED ORDER — LABETALOL HCL 5 MG/ML IV SOLN: 10.0000 mg | Freq: Once | INTRAVENOUS | Status: DC

## 2022-05-01 MED ORDER — METOCLOPRAMIDE HCL 5 MG/ML IJ SOLN
5.0000 mg | Freq: Once | INTRAMUSCULAR | Status: AC
  Administered 2022-05-01: 5 mg via INTRAVENOUS
  Filled 2022-05-01: qty 2

## 2022-05-01 MED ORDER — THIAMINE HCL 100 MG/ML IJ SOLN: 100.0000 mg | Freq: Every day | INTRAMUSCULAR | Status: DC

## 2022-05-01 MED ORDER — HYDROMORPHONE HCL 1 MG/ML IJ SOLN
1.0000 mg | INTRAMUSCULAR | Status: DC | PRN
  Administered 2022-05-01 – 2022-05-02 (×7): 1 mg via INTRAVENOUS
  Filled 2022-05-01 (×7): qty 1

## 2022-05-01 MED ORDER — CHLORDIAZEPOXIDE HCL 25 MG PO CAPS
25.0000 mg | ORAL_CAPSULE | Freq: Three times a day (TID) | ORAL | Status: DC | PRN
  Administered 2022-05-01: 25 mg via ORAL
  Filled 2022-05-01: qty 1

## 2022-05-01 MED ORDER — NICOTINE 21 MG/24HR TD PT24
21.0000 mg | MEDICATED_PATCH | Freq: Every day | TRANSDERMAL | Status: DC
  Administered 2022-05-01 – 2022-05-11 (×11): 21 mg via TRANSDERMAL
  Filled 2022-05-01 (×11): qty 1

## 2022-05-01 MED ORDER — NICARDIPINE HCL IN NACL 20-0.86 MG/200ML-% IV SOLN
3.0000 mg/h | INTRAVENOUS | Status: DC
  Administered 2022-05-01: 5 mg/h via INTRAVENOUS
  Administered 2022-05-01 (×2): 10 mg/h via INTRAVENOUS
  Administered 2022-05-01: 7.5 mg/h via INTRAVENOUS
  Administered 2022-05-02: 12.5 mg/h via INTRAVENOUS
  Administered 2022-05-02: 10 mg/h via INTRAVENOUS
  Filled 2022-05-01 (×9): qty 200

## 2022-05-01 MED ORDER — BISACODYL 5 MG PO TBEC
5.0000 mg | DELAYED_RELEASE_TABLET | Freq: Every day | ORAL | Status: DC | PRN
Start: 2022-05-01 — End: 2022-05-03
  Administered 2022-05-02 (×2): 5 mg via ORAL
  Filled 2022-05-01 (×2): qty 1

## 2022-05-01 MED ORDER — PANTOPRAZOLE SODIUM 40 MG PO TBEC
40.0000 mg | DELAYED_RELEASE_TABLET | Freq: Every day | ORAL | Status: DC
  Administered 2022-05-02 – 2022-05-06 (×5): 40 mg via ORAL
  Filled 2022-05-01 (×5): qty 1

## 2022-05-01 MED ORDER — POLYETHYLENE GLYCOL 3350 17 G PO PACK
17.0000 g | PACK | Freq: Every day | ORAL | Status: DC | PRN
  Administered 2022-05-02 (×2): 17 g via ORAL
  Filled 2022-05-01 (×2): qty 1

## 2022-05-01 MED ORDER — MAGNESIUM SULFATE 2 GM/50ML IV SOLN
2.0000 g | Freq: Once | INTRAVENOUS | Status: AC
  Administered 2022-05-01: 2 g via INTRAVENOUS
  Filled 2022-05-01: qty 50

## 2022-05-01 NOTE — H&P (Signed)
History and Physical    Patient: [name] JSH:[id number] DOB: [birth year] DOA: 05/01/2022 DOS: the patient was seen and examined on 05/01/2022 PCP: Patient, No Pcp Per  Patient coming from: Home  Chief Complaint:  Chief Complaint  Patient presents with   Abdominal Pain   HPI: [name] is a 36 y.o. female with medical history significant of anxiety, liver cirrhosis, cocaine abuse, hypertension, unspecified renal disorder, history of other nonhemorrhagic CVA, ventral hernia, constipation who is coming to the emergency department due to abdominal pain associated with nausea, dry heaving and constipation for the past 3 days.  No diarrhea, melena or hematochezia.  She has had chills, but no fever or night sweats. No sore throat, rhinorrhea, dyspnea, wheezing or hemoptysis.  No chest pain, palpitations, diaphoresis, PND, orthopnea or pitting edema of the lower extremities. No flank pain, dysuria, frequency or hematuria.  No polyuria, polydipsia, polyphagia or blurred vision.  She drinks about 40 ounces of beer daily.  ED course: Initial vital signs were temperature 98.4 F, pulse 117, respiration 18, BP 189/128 mmHg and O2 sat 99% on room air.  The patient received 2000 mL of normal saline bolus.  Metoclopramide 5 mg IVP, morphine 6 mg IVP, Benadryl 12.5 mg IVP, fentanyl 50 mcg IVP and was subsequently started on a nicardipine count continuous infusion.  Lab work: Her urinalysis with amber-colored, hazy appearance, ketonuria 20 and proteinuria 100 mg/dL, moderate leukocyte esterase with rare bacteria and budding yeast microscopic examination.  CBC to her white count 11.1, hemoglobin 16.0 g/dL platelets 850.  CMP showed a sodium 132, potassium 3.2, chloride 97 and CO2 22 mmol/L.  Renal function was normal.  Glucose 150 mg deciliter and total protein 9.0 g deciliter.  The rest of the CMP, lipase, phosphorus and magnesium were normal.  Imaging: CT abdomen/pelvis with contrast showed hepatic  asteatosis, cholelithiasis without cholecystitis, fibrofatty infiltration of the terminal ileum and colon as seen with chronic inflammation.  There was no evidence of acute bowel inflammation.  Review of Systems: As mentioned in the history of present illness. All other systems reviewed and are negative. Past Medical History:  Diagnosis Date   Anxiety    Cirrhosis of liver (HCC)    Cocaine use 08/04/2018   History of cocaine use 08/04/2018   Hypertension    Polysubstance abuse (HCC)    Renal disorder    Kidney Infection    Past Surgical History:  Procedure Laterality Date   ARTERY REPAIR     CESAREAN SECTION     x 5   MOUTH SURGERY     TEAR DUCT PROBING     TUBAL LIGATION     VENTRAL HERNIA REPAIR N/A 10/03/2016   Procedure: OPEN INCARCERATED HERNIA REPAIR VENTRAL ADULT;  Surgeon: Axel Filler, MD;  Location: [facility] OR;  Service: General;  Laterality: N/A;   Social History:  reports that she has been smoking cigarettes. She has been smoking an average of .5 packs per day. She has never used smokeless tobacco. She reports current alcohol use. She reports that she does not currently use drugs after having used the following drugs: Cocaine.  Allergies  Allergen Reactions   Ibuprofen Anaphylaxis    Patient thinks it may have caused her throat to swell   Peanut-Containing Drug Products Anaphylaxis   Penicillins Anaphylaxis and Hives    Has patient had a PCN reaction causing immediate rash, facial/tongue/throat swelling, SOB or lightheadedness with hypotension: Yes Has patient had a PCN reaction causing severe rash  involving mucus membranes or skin necrosis: unknown Has patient had a PCN reaction that required hospitalization No Has patient had a PCN reaction occurring within the last 10 years: No If all of the above answers are "NO", then may proceed with Cephalosporin use.    Acetaminophen     Causes  patient to have an upset stomach   Codeine Hives and Itching   Lisinopril  Swelling   Ativan [Lorazepam] Anxiety    Hallucinations per patient    Family History  Problem Relation Age of Onset   CAD Other    Diabetes Other    Hypertension Other    Cancer Other    Thyroid disease Other     Prior to Admission medications   Medication Sig Start Date End Date Taking? Authorizing Provider  acetaminophen (TYLENOL) 325 MG tablet Take 650 mg by mouth every 6 (six) hours as needed for mild pain or headache.    [provider]  albuterol (PROVENTIL HFA;VENTOLIN HFA) 108 (90 Base) MCG/ACT inhaler Inhale 1-2 puffs into the lungs every 6 (six) hours as needed for wheezing or shortness of breath. Patient not taking: Reported on 03/21/2020 09/19/17   Demetrios Loll T, PA-C  chlordiazePOXIDE (LIBRIUM) 25 MG capsule Take 1 capsule (25 mg total) by mouth 3 (three) times daily as needed for withdrawal. Patient not taking: Reported on 03/21/2020 08/04/18   Felicie Morn, NP  cloNIDine (CATAPRES) 0.1 MG tablet Take 1 tablet (0.1 mg total) by mouth 2 (two) times daily. Patient not taking: Reported on 08/04/2018 05/01/17   Elpidio Anis, PA-C  cloNIDine (CATAPRES) 0.1 MG tablet Take 1 tablet (0.1 mg total) by mouth daily. Patient not taking: Reported on 03/21/2020 08/04/18   Felicie Morn, NP  diazepam (VALIUM) 5 MG tablet Take 0.5-1 tablets (2.5-5 mg total) by mouth every 6 (six) hours as needed for anxiety. Patient not taking: Reported on 09/19/2017 08/05/17   Alison Murray, MD  folic acid (FOLVITE) 1 MG tablet Take 1 tablet (1 mg total) by mouth daily. Patient not taking: Reported on 09/19/2017 08/05/17   Alison Murray, MD  ibuprofen (ADVIL) 200 MG tablet Take 400 mg by mouth every 6 (six) hours as needed for headache or mild pain.    [provider]  methocarbamol (ROBAXIN) 500 MG tablet Take 1 tablet (500 mg total) by mouth 2 (two) times daily. Patient not taking: Reported on 08/04/2018 09/19/17   Demetrios Loll T, PA-C  metoCLOPramide (REGLAN) 10 MG tablet  Take 1 tablet (10 mg total) by mouth every 8 (eight) hours as needed for nausea. Patient not taking: Reported on 03/21/2020 08/04/18   Felicie Morn, NP  Multiple Vitamin (MULTIVITAMIN WITH MINERALS) TABS tablet Take 1 tablet by mouth daily. Patient not taking: Reported on 09/19/2017 08/05/17   Alison Murray, MD  omeprazole (PRILOSEC) 40 MG capsule Take 1 capsule (40 mg total) by mouth daily. Patient not taking: Reported on 03/21/2020 09/03/17   Ward, Layla Maw, DO  ondansetron (ZOFRAN ODT) 4 MG disintegrating tablet Take 1 tablet (4 mg total) by mouth every 8 (eight) hours as needed for nausea or vomiting. Patient not taking: Reported on 03/21/2020 09/03/17   Ward, Layla Maw, DO  predniSONE (DELTASONE) 20 MG tablet Take 3 tablets (60 mg total) by mouth daily. Patient not taking: Reported on 09/19/2017 09/03/17   Ward, Layla Maw, DO  propranolol (INDERAL) 40 MG tablet Take 1 tablet (40 mg total) by mouth 2 (two) times daily. Patient not taking: Reported on  08/04/2018 05/01/17   Elpidio Anis, PA-C  thiamine (VITAMIN B-1) 100 MG tablet Take 1 tablet (100 mg total) by mouth daily. Patient not taking: Reported on 09/19/2017 09/03/17   Ward, Layla Maw, DO    Physical Exam: Vitals:   05/01/22 1445 05/01/22 1453 05/01/22 1500 05/01/22 1545  BP: (!) 208/110  (!) 225/171 (!) 200/110  Pulse: (!) 109  (!) 108 (!) 119  Resp: 17  (!) 22 15  Temp:  97.6 F (36.4 C)    TempSrc:  Oral    SpO2: 99%  98% 98%  Weight:      Height:       Physical Exam Vitals and nursing note reviewed.  Constitutional:      General: She is awake. She is not in acute distress.    Appearance: She is well-developed. She is obese. She is ill-appearing.  HENT:     Head: Normocephalic.     Nose: No rhinorrhea.     Mouth/Throat:     Mouth: Mucous membranes are moist.  Eyes:     General: No scleral icterus.    Pupils: Pupils are equal, round, and reactive to light.  Cardiovascular:     Rate and Rhythm: Regular rhythm.  Tachycardia present.  Pulmonary:     Effort: Pulmonary effort is normal.     Breath sounds: Normal breath sounds.  Abdominal:     General: Bowel sounds are increased.     Palpations: Abdomen is soft.     Tenderness: There is abdominal tenderness in the right upper quadrant. There is right CVA tenderness. There is no left CVA tenderness, guarding or rebound.  Musculoskeletal:     Cervical back: Neck supple.     Right lower leg: No edema.     Left lower leg: No edema.  Skin:    General: Skin is warm and dry.  Neurological:     General: No focal deficit present.     Mental Status: She is alert and oriented to person, place, and time.  Psychiatric:        Mood and Affect: Mood normal.        Behavior: Behavior normal.   Data Reviewed:  There are no new results to review at this time.  Assessment and Plan: Principal Problem:   Hypertensive emergency without congestive heart failure Has not had clonidine or propranolol in several days. Showing proteinuria on urine analysis. Observation/stepdown. Continue nicardipine drip. Last cocaine use 2 weeks ago. UDS is negative for cocaine. Resume propranolol 40 mg p.o. daily.   Resume clonidine 0.1 mg p.o. twice daily.  Active Problems:   Abdominal pain   Nausea and vomiting Clear liquid diet. Analgesics as needed. Antiemetics as needed. Gentle IV hydration.    Alcohol withdrawal (HCC) Has side effects to lorazepam. Chlordiazepoxide as needed. Magnesium sulfate supplementation. Folate, MVI and thiamine. Consult TOC.    Hepatic steatosis Alcohol cessation advised.    Substance abuse (HCC) Consult TOC.    Cocaine abuse (HCC) Consult TOC.     Advance Care Planning:   Code Status: Full Code   Consults:   Family Communication:   Severity of Illness: The appropriate patient status for this patient is OBSERVATION. Observation status is judged to be reasonable and necessary in order to provide the required intensity of  service to ensure the patient's safety. The patient's presenting symptoms, physical exam findings, and initial radiographic and laboratory data in the context of their medical condition is felt to place them at decreased  risk for further clinical deterioration. Furthermore, it is anticipated that the patient will be medically stable for discharge from the hospital within 2 midnights of admission.   Author: Bobette Mo, MD 05/01/2022 4:02 PM  For on call review [email or web address].   This document was prepared using Dragon voice recognition software and may contain some unintended transcription errors.

## 2022-05-01 NOTE — ED Notes (Signed)
ED TO INPATIENT HANDOFF REPORT  Name/Age/Gender [name] 36 y.o. female  Code Status    Code Status Orders  (From admission, onward)           Start     Ordered   05/01/22 1513  Full code  Continuous        05/01/22 1514           Code Status History     Date Active Date Inactive Code Status Order ID Comments User Context   03/21/2020 0321 03/22/2020 1717 Full Code [id number]  John Giovanni, MD ED   08/01/2017 0012 08/05/2017 1647 Full Code [id number]  Clydie Braun, MD Inpatient   10/03/2016 0325 10/04/2016 1741 Full Code [id number]  Manus Rudd, MD Inpatient       Home/SNF/Other Home  Chief Complaint Hypertensive emergency without congestive heart failure [I16.1]  Level of Care/Admitting Diagnosis ED Disposition     ED Disposition  Admit   Condition  --   Comment  Hospital Area: [facility] [[id number]]  Level of Care: Stepdown [14]  Admit to SDU based on following criteria: Hemodynamic compromise or significant risk of instability:  Patient requiring short term acute titration and management of vasoactive drips, and invasive monitoring (i.e., CVP and Arterial line).  May place patient in observation at [facility] or [facility] if equivalent level of care is available:: No  Covid Evaluation: Asymptomatic - no recent exposure (last 10 days) testing not required  Diagnosis: Hypertensive emergency without congestive heart failure [683419]  Admitting Physician: Bobette Mo [[id number]]  Attending Physician: Bobette Mo [[id number]]          Medical History Past Medical History:  Diagnosis Date   Anxiety    Cirrhosis of liver (HCC)    Cocaine use 08/04/2018   History of cocaine use 08/04/2018   Hypertension    Polysubstance abuse (HCC)    Renal disorder    Kidney Infection     Allergies Allergies  Allergen Reactions   Ibuprofen Anaphylaxis    Patient thinks it may have caused her throat to swell    Peanut-Containing Drug Products Anaphylaxis   Penicillins Anaphylaxis and Hives    Has patient had a PCN reaction causing immediate rash, facial/tongue/throat swelling, SOB or lightheadedness with hypotension: Yes Has patient had a PCN reaction causing severe rash involving mucus membranes or skin necrosis: unknown Has patient had a PCN reaction that required hospitalization No Has patient had a PCN reaction occurring within the last 10 years: No If all of the above answers are "NO", then may proceed with Cephalosporin use.    Acetaminophen     Causes  patient to have an upset stomach   Codeine Hives and Itching   Lisinopril Swelling   Ativan [Lorazepam] Anxiety    Hallucinations per patient    IV Location/Drains/Wounds Patient Lines/Drains/Airways Status     Active Line/Drains/Airways     Name Placement date Placement time Site Days   Peripheral IV 05/01/22 20 G 1" Left Antecubital 05/01/22  1037  Antecubital  less than 1   Peripheral IV 05/01/22 20 G Right Antecubital 05/01/22  1522  Antecubital  less than 1            Labs/Imaging Results for orders placed or performed during the hospital encounter of 05/01/22 (from the past 48 hour(s))  Lipase, blood     Status: None   Collection Time: 05/01/22 10:25 AM  Result Value Ref Range  Lipase 39 11 - 51 U/L    Comment: Performed at [facility], [address], [place], [place] [address]  Comprehensive metabolic panel     Status: Abnormal   Collection Time: 05/01/22 10:25 AM  Result Value Ref Range   Sodium 132 (L) 135 - 145 mmol/L   Potassium 3.2 (L) 3.5 - 5.1 mmol/L   Chloride 97 (L) 98 - 111 mmol/L   CO2 22 22 - 32 mmol/L   Glucose, Bld 115 (H) 70 - 99 mg/dL    Comment: Glucose reference range applies only to samples taken after fasting for at least 8 hours.   BUN 15 6 - 20 mg/dL   Creatinine, Ser 1.88 0.44 - 1.00 mg/dL   Calcium 9.6 8.9 - 41.6 mg/dL   Total Protein 9.0 (H) 6.5 - 8.1 g/dL    Albumin 4.4 3.5 - 5.0 g/dL   AST 17 15 - 41 U/L   ALT 19 0 - 44 U/L   Alkaline Phosphatase 97 38 - 126 U/L   Total Bilirubin 1.1 0.3 - 1.2 mg/dL   GFR, Estimated >60 >63 mL/min    Comment: (NOTE) Calculated using the CKD-EPI Creatinine Equation (2021)    Anion gap 13 5 - 15    Comment: Performed at [facility], [address], [place], [place] [address]  CBC     Status: Abnormal   Collection Time: 05/01/22 10:25 AM  Result Value Ref Range   WBC 11.1 (H) 4.0 - 10.5 K/uL   RBC 5.32 (H) 3.87 - 5.11 MIL/uL   Hemoglobin 16.0 (H) 12.0 - 15.0 g/dL   HCT 09.3 (H) 23.5 - 57.3 %   MCV 89.5 80.0 - 100.0 fL   MCH 30.1 26.0 - 34.0 pg   MCHC 33.6 30.0 - 36.0 g/dL   RDW 22.0 25.4 - 27.0 %   Platelets 199 150 - 400 K/uL   nRBC 0.0 0.0 - 0.2 %    Comment: Performed at [facility], [address], [place], [place] [address]  Urinalysis, Routine w reflex microscopic Urine, Clean Catch     Status: Abnormal   Collection Time: 05/01/22 10:25 AM  Result Value Ref Range   Color, Urine AMBER (A) YELLOW    Comment: BIOCHEMICALS MAY BE AFFECTED BY COLOR   APPearance HAZY (A) CLEAR   Specific Gravity, Urine 1.030 1.005 - 1.030   pH 5.0 5.0 - 8.0   Glucose, UA NEGATIVE NEGATIVE mg/dL   Hgb urine dipstick NEGATIVE NEGATIVE   Bilirubin Urine NEGATIVE NEGATIVE   Ketones, ur 20 (A) NEGATIVE mg/dL   Protein, ur 283 (A) NEGATIVE mg/dL   Nitrite NEGATIVE NEGATIVE   Leukocytes,Ua MODERATE (A) NEGATIVE   RBC / HPF 0-5 0 - 5 RBC/hpf   WBC, UA 11-20 0 - 5 WBC/hpf   Bacteria, UA RARE (A) NONE SEEN   Squamous Epithelial / LPF 21-50 0 - 5   Mucus PRESENT    Budding Yeast PRESENT    Hyaline Casts, UA PRESENT     Comment: Performed at [facility], [address], [place], [place] [address]  Magnesium     Status: None   Collection Time: 05/01/22 10:38 AM  Result Value Ref Range   Magnesium 2.1 1.7 - 2.4 mg/dL    Comment: Performed at [facility], [address], [place], [place] [address]  Phosphorus     Status: None   Collection Time: 05/01/22 10:38 AM  Result Value Ref Range   Phosphorus 3.5 2.5 - 4.6 mg/dL    Comment: Performed at [facility], [address], [place], [place] [address]  I-Stat beta hCG blood, ED     Status: None   Collection Time: 05/01/22 10:40 AM  Result Value Ref Range   I-stat hCG, quantitative <5.0 <5 mIU/mL   Comment 3            Comment:   GEST. AGE      CONC.  (mIU/mL)   <=1 WEEK        5 - 50     2 WEEKS       50 - 500     3 WEEKS       100 - 10,000     4 WEEKS     1,000 - 30,000        FEMALE AND NON-PREGNANT FEMALE:     LESS THAN 5 mIU/mL    US Pelvis Complete  Result Date: 05/01/2022 CLINICAL DATA:  Pelvic pain EXAM: TRANSABDOMINAL AND TRANSVAGINAL ULTRASOUND OF PELVIS DOPPLER ULTRASOUND OF OVARIES TECHNIQUE: Both transabdominal and transvaginal ultrasound examinations of the pelvis were performed. Transabdominal technique was performed for global imaging of the pelvis including uterus, ovaries, adnexal regions, and pelvic cul-de-sac. It was necessary to proceed with endovaginal exam following the transabdominal exam to visualize the endometrium and ovaries. Color and duplex Doppler ultrasound was utilized to evaluate blood flow to the ovaries. COMPARISON:  CT abdomen 05/01/2022 FINDINGS: Uterus Measurements: 9.4 x 5 x 5.8 cm = volume: 140.8 mL. No fibroids or other mass visualized. Endometrium Thickness: 10 mm.  No focal abnormality visualized. Right ovary Measurements: 3.3 x 2.3 x 3.1 cm = volume: 12 mL. Normal appearance/no adnexal mass. Left ovary Measurements: 3 x 2.5 x 3.1 cm = volume: 12 mL. Normal appearance/no adnexal mass. Pulsed Doppler evaluation of both ovaries demonstrates normal low-resistance arterial and venous waveforms. Other findings No abnormal free fluid. IMPRESSION: 1. Normal pelvic ultrasound. 2. No sonographic findings to explain the  patient's pelvic pain. Electronically Signed   By: Elige Ko M.D.   On: 05/01/2022 14:30   US Transvaginal Non-OB  Result Date: 05/01/2022 CLINICAL DATA:  Pelvic pain EXAM: TRANSABDOMINAL AND TRANSVAGINAL ULTRASOUND OF PELVIS DOPPLER ULTRASOUND OF OVARIES TECHNIQUE: Both transabdominal and transvaginal ultrasound examinations of the pelvis were performed. Transabdominal technique was performed for global imaging of the pelvis including uterus, ovaries, adnexal regions, and pelvic cul-de-sac. It was necessary to proceed with endovaginal exam following the transabdominal exam to visualize the endometrium and ovaries. Color and duplex Doppler ultrasound was utilized to evaluate blood flow to the ovaries. COMPARISON:  CT abdomen 05/01/2022 FINDINGS: Uterus Measurements: 9.4 x 5 x 5.8 cm = volume: 140.8 mL. No fibroids or other mass visualized. Endometrium Thickness: 10 mm.  No focal abnormality visualized. Right ovary Measurements: 3.3 x 2.3 x 3.1 cm = volume: 12 mL. Normal appearance/no adnexal mass. Left ovary Measurements: 3 x 2.5 x 3.1 cm = volume: 12 mL. Normal appearance/no adnexal mass. Pulsed Doppler evaluation of both ovaries demonstrates normal low-resistance arterial and venous waveforms. Other findings No abnormal free fluid. IMPRESSION: 1. Normal pelvic ultrasound. 2. No sonographic findings to explain the patient's pelvic pain. Electronically Signed   By: Elige Ko M.D.   On: 05/01/2022 14:30   [place] Art/Ven Flow Abd Pelv Doppler  Result Date: 05/01/2022 CLINICAL DATA:  Pelvic pain EXAM: TRANSABDOMINAL AND TRANSVAGINAL ULTRASOUND OF PELVIS DOPPLER ULTRASOUND OF OVARIES TECHNIQUE:  Both transabdominal and transvaginal ultrasound examinations of the pelvis were performed. Transabdominal technique was performed for global imaging of the pelvis including uterus, ovaries, adnexal regions, and pelvic cul-de-sac. It was necessary to proceed with endovaginal exam following the transabdominal exam to  visualize the endometrium and ovaries. Color and duplex Doppler ultrasound was utilized to evaluate blood flow to the ovaries. COMPARISON:  CT abdomen 05/01/2022 FINDINGS: Uterus Measurements: 9.4 x 5 x 5.8 cm = volume: 140.8 mL. No fibroids or other mass visualized. Endometrium Thickness: 10 mm.  No focal abnormality visualized. Right ovary Measurements: 3.3 x 2.3 x 3.1 cm = volume: 12 mL. Normal appearance/no adnexal mass. Left ovary Measurements: 3 x 2.5 x 3.1 cm = volume: 12 mL. Normal appearance/no adnexal mass. Pulsed Doppler evaluation of both ovaries demonstrates normal low-resistance arterial and venous waveforms. Other findings No abnormal free fluid. IMPRESSION: 1. Normal pelvic ultrasound. 2. No sonographic findings to explain the patient's pelvic pain. Electronically Signed   By: Elige Ko M.D.   On: 05/01/2022 14:30   CT Abdomen Pelvis Wo Contrast  Result Date: 05/01/2022 CLINICAL DATA:  Three days of lower abdominal pain. Positive hematuria. Prior history of tubal ligation. EXAM: CT ABDOMEN AND PELVIS WITHOUT CONTRAST TECHNIQUE: Multidetector CT imaging of the abdomen and pelvis was performed following the standard protocol without IV contrast. RADIATION DOSE REDUCTION: This exam was performed according to the departmental dose-optimization program which includes automated exposure control, adjustment of the mA and/or kV according to patient size and/or use of iterative reconstruction technique. COMPARISON:  August 04, 2018 FINDINGS: Lower chest: No acute abnormality. Hepatobiliary: Diffuse hepatic steatosis. Subtle hypodensity along the anterior aspect of the liver measures 7 mm on image 14/2 and while technically too small to accurately characterize but is stable dating back to August 04, 2018 consistent with a benign finding. Cholelithiasis without findings of acute cholecystitis. No biliary ductal dilation. Pancreas: No pancreatic ductal dilation or evidence of acute inflammation.  Spleen: No splenomegaly. Adrenals/Urinary Tract: Mild thickening of the bilateral adrenal glands without discrete nodularity, favor hyperplasia. No hydronephrosis. No renal, ureteral or bladder calculi. Urinary bladder is unremarkable for degree of distension. Stomach/Bowel: Hyperdense material in the colon traversing the rectum likely reflects ingested antidiarrheal agent. Stomach is unremarkable for degree of distension. No pathologic dilation of small or large bowel. The appendix appears normal. Colon is predominately decompressed limiting evaluation. Fibrofatty infiltration of the terminal ileum and colon likely reflect sequela of chronic inflammation. No evidence of acute bowel inflammation. Vascular/Lymphatic: Normal caliber abdominal aorta. Prominent retroperitoneal lymph nodes are similar dating back to August 04, 2018 consistent with a benign/indolent process. Reproductive: Uterus and bilateral adnexa are unremarkable in noncontrast CT appearance for reproductive age female. Other: Trace pelvic free fluid is within physiologic normal limits. Musculoskeletal: No acute osseous abnormality. IMPRESSION: 1. No hydronephrosis or evidence of obstructive uropathy. 2. Hepatic steatosis. 3. Cholelithiasis without findings of acute cholecystitis. 4. Fibrofatty infiltration of the terminal ileum and colon as can be seen with chronic inflammation. No evidence of acute bowel inflammation. Electronically Signed   By: Maudry Mayhew M.D.   On: 05/01/2022 12:59    Pending Labs Unresulted Labs (From admission, onward)     Start     Ordered   05/02/22 0500  HIV Antibody (routine testing w rflx)  (HIV Antibody (Routine testing w reflex) panel)  Tomorrow morning,   R        05/01/22 1514   05/02/22 0500  CBC  Tomorrow morning,  R        05/01/22 1514   05/02/22 0500  Comprehensive metabolic panel  Tomorrow morning,   R        05/01/22 1514   05/01/22 1113  Rapid urine drug screen (hospital performed)  ONCE -  STAT,   STAT        05/01/22 1112            Vitals/Pain Today's Vitals   05/01/22 1445 05/01/22 1453 05/01/22 1500 05/01/22 1545  BP: (!) 208/110  (!) 225/171 (!) 200/110  Pulse: (!) 109  (!) 108 (!) 119  Resp: 17  (!) 22 15  Temp:  97.6 F (36.4 C)    TempSrc:  Oral    SpO2: 99%  98% 98%  Weight:      Height:      PainSc:        Isolation Precautions No active isolations  Medications Medications  nicardipine (CARDENE) 20mg  in 0.86% saline IV infusion (0.1 mg/ml) (5 mg/hr Intravenous New Bag/Given 05/01/22 1514)  HYDROmorphone (DILAUDID) injection 1 mg (1 mg Intravenous Given 05/01/22 1527)  ondansetron (ZOFRAN) tablet 4 mg (has no administration in time range)    Or  ondansetron (ZOFRAN) injection 4 mg (has no administration in time range)  0.45 % NaCl with KCl 20 mEq / L infusion (has no administration in time range)  potassium chloride 10 mEq in 100 mL IVPB (has no administration in time range)  magnesium sulfate IVPB 2 g 50 mL (2 g Intravenous New Bag/Given 05/01/22 1527)  metoCLOPramide (REGLAN) injection 10 mg (has no administration in time range)  pantoprazole (PROTONIX) injection 40 mg (has no administration in time range)  lactated ringers bolus 2,000 mL (2,000 mLs Intravenous New Bag/Given 05/01/22 1119)  metoCLOPramide (REGLAN) injection 5 mg (5 mg Intravenous Given 05/01/22 1128)  diphenhydrAMINE (BENADRYL) injection 12.5 mg (12.5 mg Intravenous Given 05/01/22 1123)  fentaNYL (SUBLIMAZE) injection 50 mcg (50 mcg Intravenous Given 05/01/22 1124)  hydrALAZINE (APRESOLINE) injection 5 mg (5 mg Intravenous Given 05/01/22 1122)  morphine (PF) 4 MG/ML injection 6 mg (6 mg Intravenous Given 05/01/22 1308)  hydrALAZINE (APRESOLINE) injection 10 mg (10 mg Intravenous Given 05/01/22 1424)  metoCLOPramide (REGLAN) injection 5 mg (5 mg Intravenous Given 05/01/22 1419)  diphenhydrAMINE (BENADRYL) injection 12.5 mg (12.5 mg Intravenous Given 05/01/22 1420)    Mobility walks with  device

## 2022-05-01 NOTE — ED Provider Notes (Signed)
[facility]-EMERGENCY DEPT Provider Note   CSN: [id number] Arrival date & time: 05/01/22  5188     History  Chief Complaint  Patient presents with   Abdominal Pain    [name] is a 36 y.o. female.  36 year old female presents with several days of lower abdominal pain.  She denies any vaginal bleeding or discharge.  Notes some constipation and states that she has not had a bowel movement in 3 days.  Abdominal pain is characterized as cramping and associated some dysuria.  No flank discomfort.  Does admit to daily alcohol use but has not drank recently.  Has been having dry heaves.  No history of same.  Patient notes that she has been noncompliant with her blood pressure medication.  Does admit to using cocaine about a month ago       Home Medications Prior to Admission medications   Medication Sig Start Date End Date Taking? Authorizing Provider  acetaminophen (TYLENOL) 325 MG tablet Take 650 mg by mouth every 6 (six) hours as needed for mild pain or headache.    [provider]  albuterol (PROVENTIL HFA;VENTOLIN HFA) 108 (90 Base) MCG/ACT inhaler Inhale 1-2 puffs into the lungs every 6 (six) hours as needed for wheezing or shortness of breath. Patient not taking: Reported on 03/21/2020 09/19/17   Demetrios Loll T, PA-C  chlordiazePOXIDE (LIBRIUM) 25 MG capsule Take 1 capsule (25 mg total) by mouth 3 (three) times daily as needed for withdrawal. Patient not taking: Reported on 03/21/2020 08/04/18   Felicie Morn, NP  cloNIDine (CATAPRES) 0.1 MG tablet Take 1 tablet (0.1 mg total) by mouth 2 (two) times daily. Patient not taking: Reported on 08/04/2018 05/01/17   Elpidio Anis, PA-C  cloNIDine (CATAPRES) 0.1 MG tablet Take 1 tablet (0.1 mg total) by mouth daily. Patient not taking: Reported on 03/21/2020 08/04/18   Felicie Morn, NP  diazepam (VALIUM) 5 MG tablet Take 0.5-1 tablets (2.5-5 mg total) by mouth every 6 (six) hours as needed for  anxiety. Patient not taking: Reported on 09/19/2017 08/05/17   Alison Murray, MD  folic acid (FOLVITE) 1 MG tablet Take 1 tablet (1 mg total) by mouth daily. Patient not taking: Reported on 09/19/2017 08/05/17   Alison Murray, MD  ibuprofen (ADVIL) 200 MG tablet Take 400 mg by mouth every 6 (six) hours as needed for headache or mild pain.    [provider]  methocarbamol (ROBAXIN) 500 MG tablet Take 1 tablet (500 mg total) by mouth 2 (two) times daily. Patient not taking: Reported on 08/04/2018 09/19/17   Demetrios Loll T, PA-C  metoCLOPramide (REGLAN) 10 MG tablet Take 1 tablet (10 mg total) by mouth every 8 (eight) hours as needed for nausea. Patient not taking: Reported on 03/21/2020 08/04/18   Felicie Morn, NP  Multiple Vitamin (MULTIVITAMIN WITH MINERALS) TABS tablet Take 1 tablet by mouth daily. Patient not taking: Reported on 09/19/2017 08/05/17   Alison Murray, MD  omeprazole (PRILOSEC) 40 MG capsule Take 1 capsule (40 mg total) by mouth daily. Patient not taking: Reported on 03/21/2020 09/03/17   Ward, Layla Maw, DO  ondansetron (ZOFRAN ODT) 4 MG disintegrating tablet Take 1 tablet (4 mg total) by mouth every 8 (eight) hours as needed for nausea or vomiting. Patient not taking: Reported on 03/21/2020 09/03/17   Ward, Layla Maw, DO  predniSONE (DELTASONE) 20 MG tablet Take 3 tablets (60 mg total) by mouth daily. Patient not taking: Reported on 09/19/2017 09/03/17  Ward, Layla Maw, DO  propranolol (INDERAL) 40 MG tablet Take 1 tablet (40 mg total) by mouth 2 (two) times daily. Patient not taking: Reported on 08/04/2018 05/01/17   Elpidio Anis, PA-C  thiamine (VITAMIN B-1) 100 MG tablet Take 1 tablet (100 mg total) by mouth daily. Patient not taking: Reported on 09/19/2017 09/03/17   Ward, Layla Maw, DO      Allergies    Ibuprofen, Peanut-containing drug products, Penicillins, Acetaminophen, Codeine, Lisinopril, and Ativan [lorazepam]    Review of Systems   Review of Systems   All other systems reviewed and are negative.   Physical Exam Updated Vital Signs BP (!) 200/141   Pulse (!) 112   Temp 98.1 F (36.7 C) (Oral)   Resp 18   Ht 1.524 m (5')   Wt 63.5 kg   LMP 04/19/2022 (Approximate)   SpO2 98%   BMI 27.34 kg/m  Physical Exam Vitals and nursing note reviewed.  Constitutional:      General: She is not in acute distress.    Appearance: Normal appearance. She is well-developed. She is not toxic-appearing.  HENT:     Head: Normocephalic and atraumatic.  Eyes:     General: Lids are normal.     Conjunctiva/sclera: Conjunctivae normal.     Pupils: Pupils are equal, round, and reactive to light.  Neck:     Thyroid: No thyroid mass.     Trachea: No tracheal deviation.  Cardiovascular:     Rate and Rhythm: Normal rate and regular rhythm.     Heart sounds: Normal heart sounds. No murmur heard.    No gallop.  Pulmonary:     Effort: Pulmonary effort is normal. No respiratory distress.     Breath sounds: Normal breath sounds. No stridor. No decreased breath sounds, wheezing, rhonchi or rales.  Abdominal:     General: There is no distension.     Palpations: Abdomen is soft.     Tenderness: There is abdominal tenderness in the right lower quadrant, suprapubic area and left lower quadrant. There is no guarding or rebound.  Musculoskeletal:        General: No tenderness. Normal range of motion.     Cervical back: Normal range of motion and neck supple.  Skin:    General: Skin is warm and dry.     Findings: No abrasion or rash.  Neurological:     Mental Status: She is alert and oriented to person, place, and time. Mental status is at baseline.     GCS: GCS eye subscore is 4. GCS verbal subscore is 5. GCS motor subscore is 6.     Cranial Nerves: No cranial nerve deficit.     Sensory: No sensory deficit.     Motor: Motor function is intact.  Psychiatric:        Attention and Perception: Attention normal.        Speech: Speech normal.         Behavior: Behavior normal.     ED Results / Procedures / Treatments   Labs (all labs ordered are listed, but only abnormal results are displayed) Labs Reviewed  COMPREHENSIVE METABOLIC PANEL - Abnormal; Notable for the following components:      Result Value   Sodium 132 (*)    Potassium 3.2 (*)    Chloride 97 (*)    Glucose, Bld 115 (*)    Total Protein 9.0 (*)    All other components within normal limits  CBC - Abnormal; Notable  for the following components:   WBC 11.1 (*)    RBC 5.32 (*)    Hemoglobin 16.0 (*)    HCT 47.6 (*)    All other components within normal limits  URINALYSIS, ROUTINE W REFLEX MICROSCOPIC - Abnormal; Notable for the following components:   Color, Urine AMBER (*)    APPearance HAZY (*)    Ketones, ur 20 (*)    Protein, ur 100 (*)    Leukocytes,Ua MODERATE (*)    Bacteria, UA RARE (*)    All other components within normal limits  LIPASE, BLOOD  I-STAT BETA HCG BLOOD, ED ([facility], [facility], [facility] ONLY)    EKG None  Radiology No results found.  Procedures Procedures    Medications Ordered in ED Medications  lactated ringers bolus 2,000 mL (has no administration in time range)  lactated ringers infusion (has no administration in time range)  metoCLOPramide (REGLAN) injection 5 mg (has no administration in time range)  diphenhydrAMINE (BENADRYL) injection 12.5 mg (has no administration in time range)  fentaNYL (SUBLIMAZE) injection 50 mcg (has no administration in time range)  hydrALAZINE (APRESOLINE) injection 5 mg (has no administration in time range)    ED Course/ Medical Decision Making/ A&P                           Medical Decision Making Amount and/or Complexity of Data Reviewed Labs: ordered. Radiology: ordered.  Risk Prescription drug management. Decision regarding hospitalization.   Given IV fluids for her emesis here.  Was given hydralazine for her blood pressure which did not really help.  She remains profoundly hypertensive.  Will  be given additional dose of hydralazine.  Had long discussion with patient about when her last cocaine use was.  It was at least over 3 weeks ago.  This conversation was done with from the nurse.  Patient has been out of her antihypertensives for several weeks.  I believe this is why her blood pressure still high and could contribute to her emesis.  She denies any headache at this time.  Will give patient dose of labetalol.  Monitor  1:21 PM Patient is now changing her mind about her cocaine use.  Will not give patient beta-blocker.  We will instead give additional dose of hydralazine as well as antiemetic.  Patient complained of having some right-sided pelvic pain.  Her abdominal CT per my review interpretation did not show any acute findings.  Will order pelvic ultrasound.  To rule out torsion  3:12 PM Patient remained hypertensive despite multiple medications for her blood pressure.  Started on Cardene drip.  Pelvic ultrasound per my interpretation showed no acute findings.  Suspect that she is having rebound hypertension.  Will require hospitalization.  Discussed with hospitalist and she will admit to stepdown.  CRITICAL CARE Performed by: Toy Baker Total critical care time: 60 minutes Critical care time was exclusive of separately billable procedures and treating other patients. Critical care was necessary to treat or prevent imminent or life-threatening deterioration. Critical care was time spent personally by me on the following activities: development of treatment plan with patient and/or surrogate as well as nursing, discussions with consultants, evaluation of patient's response to treatment, examination of patient, obtaining history from patient or surrogate, ordering and performing treatments and interventions, ordering and review of laboratory studies, ordering and review of radiographic studies, pulse oximetry and re-evaluation of patient's condition.          Final  Clinical  Impression(s) / ED Diagnoses Final diagnoses:  None    Rx / DC Orders ED Discharge Orders     None         Lorre Nick, MD 05/01/22 1514

## 2022-05-01 NOTE — ED Notes (Signed)
Patient transported to CT 

## 2022-05-01 NOTE — ED Notes (Signed)
Provider aware patient continues to request pain medication.

## 2022-05-01 NOTE — ED Notes (Signed)
Patient ambulatory to restroom independently

## 2022-05-01 NOTE — ED Notes (Signed)
Patient reports having small BM.

## 2022-05-01 NOTE — ED Triage Notes (Addendum)
Pt coming from home via EMS with c/o lower abdominal pain. Pt states she has not had a BM in 3 days. Pt also c/o nausea with dry heaves.

## 2022-05-01 NOTE — ED Notes (Signed)
Patient ambulatory to restroom independently. [place] at bedside. Will administer medications after [place].

## 2022-05-02 DIAGNOSIS — I160 Hypertensive urgency: Secondary | ICD-10-CM

## 2022-05-02 DIAGNOSIS — E876 Hypokalemia: Secondary | ICD-10-CM

## 2022-05-02 DIAGNOSIS — F1020 Alcohol dependence, uncomplicated: Secondary | ICD-10-CM

## 2022-05-02 LAB — LACTIC ACID, PLASMA: Lactic Acid, Venous: 1.6 mmol/L (ref 0.5–1.9)

## 2022-05-02 LAB — COMPREHENSIVE METABOLIC PANEL
ALT: 14 U/L (ref 0–44)
AST: 17 U/L (ref 15–41)
Albumin: 3.5 g/dL (ref 3.5–5.0)
Alkaline Phosphatase: 75 U/L (ref 38–126)
Anion gap: 8 (ref 5–15)
BUN: 8 mg/dL (ref 6–20)
CO2: 23 mmol/L (ref 22–32)
Calcium: 8.5 mg/dL — ABNORMAL LOW (ref 8.9–10.3)
Chloride: 101 mmol/L (ref 98–111)
Creatinine, Ser: 0.54 mg/dL (ref 0.44–1.00)
GFR, Estimated: >60 mL/min (ref >60)
Glucose, Bld: 165 mg/dL — ABNORMAL HIGH (ref 70–99)
Potassium: 3.2 mmol/L — ABNORMAL LOW (ref 3.5–5.1)
Sodium: 132 mmol/L — ABNORMAL LOW (ref 135–145)
Total Bilirubin: 0.7 mg/dL (ref 0.3–1.2)
Total Protein: 7 g/dL (ref 6.5–8.1)

## 2022-05-02 LAB — CBC
HCT: 38.1 % (ref 36.0–46.0)
Hemoglobin: 12.8 g/dL (ref 12.0–15.0)
MCH: 30.4 pg (ref 26.0–34.0)
MCHC: 33.6 g/dL (ref 30.0–36.0)
MCV: 90.5 fL (ref 80.0–100.0)
Platelets: 178 10*3/uL (ref 150–400)
RBC: 4.21 MIL/uL (ref 3.87–5.11)
RDW: 14.5 % (ref 11.5–15.5)
WBC: 9.7 10*3/uL (ref 4.0–10.5)
nRBC: 0 % (ref 0.0–0.2)

## 2022-05-02 MED ORDER — PROPRANOLOL HCL 20 MG PO TABS: 40.0000 mg | ORAL_TABLET | Freq: Two times a day (BID) | ORAL | Status: DC

## 2022-05-02 MED ORDER — CLONIDINE HCL 0.1 MG PO TABS
0.1000 mg | ORAL_TABLET | Freq: Two times a day (BID) | ORAL | Status: DC
  Administered 2022-05-02 – 2022-05-06 (×9): 0.1 mg via ORAL
  Filled 2022-05-02 (×9): qty 1

## 2022-05-02 MED ORDER — NICARDIPINE HCL IN NACL 40-0.83 MG/200ML-% IV SOLN
3.0000 mg/h | INTRAVENOUS | Status: DC
  Administered 2022-05-02 (×2): 12.5 mg/h via INTRAVENOUS
  Filled 2022-05-02 (×3): qty 200

## 2022-05-02 MED ORDER — HYDRALAZINE HCL 25 MG PO TABS
25.0000 mg | ORAL_TABLET | Freq: Three times a day (TID) | ORAL | Status: DC
  Administered 2022-05-02 – 2022-05-06 (×14): 25 mg via ORAL
  Filled 2022-05-02 (×14): qty 1

## 2022-05-02 MED ORDER — HYDROXYZINE HCL 25 MG PO TABS: 25.0000 mg | ORAL_TABLET | Freq: Once | ORAL | Status: DC

## 2022-05-02 MED ORDER — CHLORDIAZEPOXIDE HCL 25 MG PO CAPS
25.0000 mg | ORAL_CAPSULE | Freq: Three times a day (TID) | ORAL | Status: DC
  Administered 2022-05-02 – 2022-05-05 (×10): 25 mg via ORAL
  Filled 2022-05-02 (×10): qty 1

## 2022-05-02 MED ORDER — TRAMADOL HCL 50 MG PO TABS
50.0000 mg | ORAL_TABLET | Freq: Two times a day (BID) | ORAL | Status: DC | PRN
  Administered 2022-05-02 – 2022-05-07 (×9): 50 mg via ORAL
  Filled 2022-05-02 (×9): qty 1

## 2022-05-02 MED ORDER — POTASSIUM CHLORIDE CRYS ER 20 MEQ PO TBCR
40.0000 meq | EXTENDED_RELEASE_TABLET | ORAL | Status: AC
  Administered 2022-05-02 (×2): 40 meq via ORAL
  Filled 2022-05-02 (×2): qty 2

## 2022-05-02 MED ORDER — HYDROXYZINE HCL 25 MG PO TABS
25.0000 mg | ORAL_TABLET | Freq: Once | ORAL | Status: AC
Start: 2022-05-02 — End: 2022-05-02
  Administered 2022-05-02: 25 mg via ORAL
  Filled 2022-05-02: qty 1

## 2022-05-02 MED ORDER — HYDROMORPHONE HCL 1 MG/ML IJ SOLN
1.0000 mg | INTRAMUSCULAR | Status: DC | PRN
  Administered 2022-05-02 – 2022-05-03 (×6): 1 mg via INTRAVENOUS
  Filled 2022-05-02 (×6): qty 1

## 2022-05-02 MED ORDER — ACETAMINOPHEN 325 MG PO TABS
650.0000 mg | ORAL_TABLET | Freq: Four times a day (QID) | ORAL | Status: DC | PRN
  Administered 2022-05-02 – 2022-05-10 (×8): 650 mg via ORAL
  Filled 2022-05-02 (×8): qty 2

## 2022-05-02 NOTE — Progress Notes (Addendum)
0800 patient alert able to make all needs known call light in reach 0930 patient up to commode as needed all meds taken as ordered PRN medication for bowels given patient on menses having some blood clots and cramping 1105 Cardene off 1830 patient transported to [address] 1414 via Wheelchair on tele RN accompanied all belongings taken with patient. RN and NT in room when dropping patient off. Patient alert X4 on room air

## 2022-05-02 NOTE — Plan of Care (Signed)
  Problem: Education: Goal: Knowledge of General Education information will improve Description: Including pain rating scale, medication(s)/side effects and non-pharmacologic comfort measures 05/02/2022 1025 by Aretta Nip, RN Outcome: Progressing 05/02/2022 1023 by Aretta Nip, RN Outcome: Progressing   Problem: Health Behavior/Discharge Planning: Goal: Ability to manage health-related needs will improve 05/02/2022 1025 by Aretta Nip, RN Outcome: Progressing 05/02/2022 1023 by Aretta Nip, RN Outcome: Progressing   Problem: Clinical Measurements: Goal: Ability to maintain clinical measurements within normal limits will improve 05/02/2022 1025 by Aretta Nip, RN Outcome: Progressing 05/02/2022 1023 by Aretta Nip, RN Outcome: Progressing Goal: Will remain free from infection 05/02/2022 1025 by Aretta Nip, RN Outcome: Progressing 05/02/2022 1023 by Aretta Nip, RN Outcome: Progressing Goal: Diagnostic test results will improve 05/02/2022 1025 by Aretta Nip, RN Outcome: Progressing 05/02/2022 1023 by Aretta Nip, RN Outcome: Progressing Goal: Respiratory complications will improve 05/02/2022 1025 by Aretta Nip, RN Outcome: Progressing 05/02/2022 1023 by Aretta Nip, RN Outcome: Progressing Goal: Cardiovascular complication will be avoided 05/02/2022 1025 by Aretta Nip, RN Outcome: Progressing 05/02/2022 1023 by Aretta Nip, RN Outcome: Progressing   Problem: Activity: Goal: Risk for activity intolerance will decrease 05/02/2022 1025 by Aretta Nip, RN Outcome: Progressing 05/02/2022 1023 by Aretta Nip, RN Outcome: Progressing   Problem: Coping: Goal: Level of anxiety will decrease 05/02/2022 1025 by Aretta Nip, RN Outcome: Progressing 05/02/2022 1023 by Aretta Nip, RN Outcome: Progressing   Problem: Elimination: Goal: Will not experience complications related to bowel motility 05/02/2022 1025 by Aretta Nip,  RN Outcome: Progressing 05/02/2022 1023 by Aretta Nip, RN Outcome: Progressing Goal: Will not experience complications related to urinary retention 05/02/2022 1025 by Aretta Nip, RN Outcome: Progressing 05/02/2022 1023 by Aretta Nip, RN Outcome: Progressing   Problem: Safety: Goal: Ability to remain free from injury will improve 05/02/2022 1025 by Aretta Nip, RN Outcome: Progressing 05/02/2022 1023 by Aretta Nip, RN Outcome: Progressing   Problem: Skin Integrity: Goal: Risk for impaired skin integrity will decrease Outcome: Progressing   Problem: Education: Goal: Knowledge of disease or condition will improve Outcome: Progressing Goal: Knowledge of patient specific risk factors will improve (INDIVIDUALIZE FOR PATIENT) Outcome: Progressing   Problem: Coping: Goal: Will verbalize positive feelings about self Outcome: Progressing   Problem: Self-Care: Goal: Ability to participate in self-care as condition permits will improve Outcome: Progressing Goal: Verbalization of feelings and concerns over difficulty with self-care will improve Outcome: Progressing   Problem: Nutrition: Goal: Risk of aspiration will decrease Outcome: Progressing   Problem: Ischemic Stroke/TIA Tissue Perfusion: Goal: Complications of ischemic stroke/TIA will be minimized Outcome: Progressing   Problem: Spontaneous Subarachnoid Hemorrhage Tissue Perfusion: Goal: Complications of Spontaneous Subarachnoid Hemorrhage will be minimized Outcome: Progressing

## 2022-05-02 NOTE — Progress Notes (Signed)
PROGRESS NOTE    [name]  NLZ:[id number] DOB: [birth year] DOA: 05/01/2022 PCP: Patient, No Pcp Per     Brief Narrative:  [name] is a 36 y.o. female with medical history significant of anxiety, liver cirrhosis, cocaine abuse, hypertension, unspecified renal disorder, history of other nonhemorrhagic CVA, ventral hernia, constipation who is coming to the emergency department due to abdominal pain associated with nausea, dry heaving and constipation for the past 3 days.  On presentation to the emergency department, patient was hypertensive with BP up to 189/128.  She was started on nicardipine drip.  CT abdomen pelvis as well as pelvic ultrasound was completed without clear etiology of abdominal pain found.  New events last 24 hours / Subjective: Admits to "ripping" abdominal pain in her right lower quadrant.  She was able to take only couple bites of breakfast this morning.  Blood pressure has improved today.  She remains on nicardipine drip.  Asking for pain medication.  Assessment & Plan:  Principal Problem:   Hypertensive urgency Active Problems:   Alcohol withdrawal (HCC)   Substance abuse (HCC)   Hepatic steatosis   Abdominal pain   Nausea and vomiting   Cocaine abuse (HCC)   Alcohol dependence (HCC)   Hypokalemia   Hypertensive urgency without organ failure -Remains on nicardipine drip, wean as able -Continue clonidine (patient has run out).  She tells me that she has not taken propranolol for many years.  Start on hydralazine  Abdominal pain, nausea, vomiting -Unclear etiology, could be constipation versus GYN issues such as endometriosis -Pelvic ultrasound unremarkable -CT abdomen pelvis unremarkable, did not show fatty infiltration terminal ileum and colon -Lactic acid is negative, unlikely to be mesenteric ischemia -UA unremarkable  Alcohol dependence -Librium 3 times daily for withdrawal  Substance abuse -UDS is negative for cocaine, admitted last  cocaine use 2 weeks ago -UDS positive for opiate  Tobacco abuse -Nicotine patch  Hypokalemia -Replace   DVT prophylaxis:  SCDs Start: 05/01/22 1512  Code Status: Full code Family Communication: No family at bedside Disposition Plan:  Status is: Observation The patient will require care spanning > 2 midnights and should be moved to inpatient because: Remains on nicardipine drip  Antimicrobials:  Anti-infectives (From admission, onward)    None        Objective: Vitals:   05/02/22 1015 05/02/22 1030 05/02/22 1045 05/02/22 1100  BP: 125/83 124/68 (!) 122/52 (!) 101/52  Pulse: (!) 110 (!) 101 (!) 108 (!) 106  Resp: (!) 24 (!) 23 20 (!) 28  Temp:      TempSrc:      SpO2: 92% 92% 95% 95%  Weight:      Height:        Intake/Output Summary (Last 24 hours) at 05/02/2022 1143 Last data filed at 05/02/2022 1105 Gross per 24 hour  Intake 3062.49 ml  Output 1350 ml  Net 1712.49 ml   Filed Weights   05/01/22 1017 05/01/22 1633  Weight: 63.5 kg 72 kg    Examination:  General exam: Appears calm and comfortable  Respiratory system: Clear to auscultation. Respiratory effort normal. No respiratory distress. No conversational dyspnea.  Cardiovascular system: S1 & S2 heard, RRR. No murmurs. No pedal edema. Gastrointestinal system: Abdomen is nondistended, soft, tender to palpation right lower quadrant Central nervous system: Alert and oriented. No focal neurological deficits. Speech clear.  Extremities: Symmetric in appearance  Skin: No rashes, lesions or ulcers on exposed skin  Psychiatry: Judgement and insight appear normal.  Mood & affect appropriate.   Data Reviewed: I have personally reviewed following labs and imaging studies  CBC: Recent Labs  Lab 05/01/22 1025 05/02/22 0306  WBC 11.1* 9.7  HGB 16.0* 12.8  HCT 47.6* 38.1  MCV 89.5 90.5  PLT 199 178   Basic Metabolic Panel: Recent Labs  Lab 05/01/22 1025 05/01/22 1038 05/02/22 0306  NA 132*  --  132*  K  3.2*  --  3.2*  CL 97*  --  101  CO2 22  --  23  GLUCOSE 115*  --  165*  BUN 15  --  8  CREATININE 0.78  --  0.54  CALCIUM 9.6  --  8.5*  MG  --  2.1  --   PHOS  --  3.5  --    GFR: Estimated Creatinine Clearance: 84.4 mL/min (by C-G formula based on SCr of 0.54 mg/dL). Liver Function Tests: Recent Labs  Lab 05/01/22 1025 05/02/22 0306  AST 17 17  ALT 19 14  ALKPHOS 97 75  BILITOT 1.1 0.7  PROT 9.0* 7.0  ALBUMIN 4.4 3.5   Recent Labs  Lab 05/01/22 1025  LIPASE 39   No results for input(s): "AMMONIA" in the last 168 hours. Coagulation Profile: No results for input(s): "INR", "PROTIME" in the last 168 hours. Cardiac Enzymes: No results for input(s): "CKTOTAL", "CKMB", "CKMBINDEX", "TROPONINI" in the last 168 hours. BNP (last 3 results) No results for input(s): "PROBNP" in the last 8760 hours. HbA1C: No results for input(s): "HGBA1C" in the last 72 hours. CBG: No results for input(s): "GLUCAP" in the last 168 hours. Lipid Profile: No results for input(s): "CHOL", "HDL", "LDLCALC", "TRIG", "CHOLHDL", "LDLDIRECT" in the last 72 hours. Thyroid Function Tests: No results for input(s): "TSH", "T4TOTAL", "FREET4", "T3FREE", "THYROIDAB" in the last 72 hours. Anemia Panel: No results for input(s): "VITAMINB12", "FOLATE", "FERRITIN", "TIBC", "IRON", "RETICCTPCT" in the last 72 hours. Sepsis Labs: Recent Labs  Lab 05/02/22 5784  LATICACIDVEN 1.6    Recent Results (from the past 240 hour(s))  MRSA Next Gen by PCR, Nasal     Status: None   Collection Time: 05/01/22  5:17 PM   Specimen: Nasal Mucosa; Nasal Swab  Result Value Ref Range Status   MRSA by PCR Next Gen NOT DETECTED NOT DETECTED Final    Comment: (NOTE) The GeneXpert MRSA Assay (FDA approved for NASAL specimens only), is one component of a comprehensive MRSA colonization surveillance program. It is not intended to diagnose MRSA infection nor to guide or monitor treatment for MRSA infections. Test performance  is not FDA approved in patients less than 40 years old. Performed at [facility], [address] [place], [place], [place] [address]       Radiology Studies: US Pelvis Complete  Result Date: 05/01/2022 CLINICAL DATA:  Pelvic pain EXAM: TRANSABDOMINAL AND TRANSVAGINAL ULTRASOUND OF PELVIS DOPPLER ULTRASOUND OF OVARIES TECHNIQUE: Both transabdominal and transvaginal ultrasound examinations of the pelvis were performed. Transabdominal technique was performed for global imaging of the pelvis including uterus, ovaries, adnexal regions, and pelvic cul-de-sac. It was necessary to proceed with endovaginal exam following the transabdominal exam to visualize the endometrium and ovaries. Color and duplex Doppler ultrasound was utilized to evaluate blood flow to the ovaries. COMPARISON:  CT abdomen 05/01/2022 FINDINGS: Uterus Measurements: 9.4 x 5 x 5.8 cm = volume: 140.8 mL. No fibroids or other mass visualized. Endometrium Thickness: 10 mm.  No focal abnormality visualized. Right ovary Measurements: 3.3 x 2.3 x 3.1 cm = volume:  12 mL. Normal appearance/no adnexal mass. Left ovary Measurements: 3 x 2.5 x 3.1 cm = volume: 12 mL. Normal appearance/no adnexal mass. Pulsed Doppler evaluation of both ovaries demonstrates normal low-resistance arterial and venous waveforms. Other findings No abnormal free fluid. IMPRESSION: 1. Normal pelvic ultrasound. 2. No sonographic findings to explain the patient's pelvic pain. Electronically Signed   By: Elige Ko M.D.   On: 05/01/2022 14:30   US Transvaginal Non-OB  Result Date: 05/01/2022 CLINICAL DATA:  Pelvic pain EXAM: TRANSABDOMINAL AND TRANSVAGINAL ULTRASOUND OF PELVIS DOPPLER ULTRASOUND OF OVARIES TECHNIQUE: Both transabdominal and transvaginal ultrasound examinations of the pelvis were performed. Transabdominal technique was performed for global imaging of the pelvis including uterus, ovaries, adnexal regions, and pelvic cul-de-sac. It was necessary to  proceed with endovaginal exam following the transabdominal exam to visualize the endometrium and ovaries. Color and duplex Doppler ultrasound was utilized to evaluate blood flow to the ovaries. COMPARISON:  CT abdomen 05/01/2022 FINDINGS: Uterus Measurements: 9.4 x 5 x 5.8 cm = volume: 140.8 mL. No fibroids or other mass visualized. Endometrium Thickness: 10 mm.  No focal abnormality visualized. Right ovary Measurements: 3.3 x 2.3 x 3.1 cm = volume: 12 mL. Normal appearance/no adnexal mass. Left ovary Measurements: 3 x 2.5 x 3.1 cm = volume: 12 mL. Normal appearance/no adnexal mass. Pulsed Doppler evaluation of both ovaries demonstrates normal low-resistance arterial and venous waveforms. Other findings No abnormal free fluid. IMPRESSION: 1. Normal pelvic ultrasound. 2. No sonographic findings to explain the patient's pelvic pain. Electronically Signed   By: Elige Ko M.D.   On: 05/01/2022 14:30   [place] Art/Ven Flow Abd Pelv Doppler  Result Date: 05/01/2022 CLINICAL DATA:  Pelvic pain EXAM: TRANSABDOMINAL AND TRANSVAGINAL ULTRASOUND OF PELVIS DOPPLER ULTRASOUND OF OVARIES TECHNIQUE: Both transabdominal and transvaginal ultrasound examinations of the pelvis were performed. Transabdominal technique was performed for global imaging of the pelvis including uterus, ovaries, adnexal regions, and pelvic cul-de-sac. It was necessary to proceed with endovaginal exam following the transabdominal exam to visualize the endometrium and ovaries. Color and duplex Doppler ultrasound was utilized to evaluate blood flow to the ovaries. COMPARISON:  CT abdomen 05/01/2022 FINDINGS: Uterus Measurements: 9.4 x 5 x 5.8 cm = volume: 140.8 mL. No fibroids or other mass visualized. Endometrium Thickness: 10 mm.  No focal abnormality visualized. Right ovary Measurements: 3.3 x 2.3 x 3.1 cm = volume: 12 mL. Normal appearance/no adnexal mass. Left ovary Measurements: 3 x 2.5 x 3.1 cm = volume: 12 mL. Normal appearance/no adnexal mass. Pulsed  Doppler evaluation of both ovaries demonstrates normal low-resistance arterial and venous waveforms. Other findings No abnormal free fluid. IMPRESSION: 1. Normal pelvic ultrasound. 2. No sonographic findings to explain the patient's pelvic pain. Electronically Signed   By: Elige Ko M.D.   On: 05/01/2022 14:30   CT Abdomen Pelvis Wo Contrast  Result Date: 05/01/2022 CLINICAL DATA:  Three days of lower abdominal pain. Positive hematuria. Prior history of tubal ligation. EXAM: CT ABDOMEN AND PELVIS WITHOUT CONTRAST TECHNIQUE: Multidetector CT imaging of the abdomen and pelvis was performed following the standard protocol without IV contrast. RADIATION DOSE REDUCTION: This exam was performed according to the departmental dose-optimization program which includes automated exposure control, adjustment of the mA and/or kV according to patient size and/or use of iterative reconstruction technique. COMPARISON:  August 04, 2018 FINDINGS: Lower chest: No acute abnormality. Hepatobiliary: Diffuse hepatic steatosis. Subtle hypodensity along the anterior aspect of the liver measures 7 mm on image 14/2 and while technically too  small to accurately characterize but is stable dating back to August 04, 2018 consistent with a benign finding. Cholelithiasis without findings of acute cholecystitis. No biliary ductal dilation. Pancreas: No pancreatic ductal dilation or evidence of acute inflammation. Spleen: No splenomegaly. Adrenals/Urinary Tract: Mild thickening of the bilateral adrenal glands without discrete nodularity, favor hyperplasia. No hydronephrosis. No renal, ureteral or bladder calculi. Urinary bladder is unremarkable for degree of distension. Stomach/Bowel: Hyperdense material in the colon traversing the rectum likely reflects ingested antidiarrheal agent. Stomach is unremarkable for degree of distension. No pathologic dilation of small or large bowel. The appendix appears normal. Colon is predominately  decompressed limiting evaluation. Fibrofatty infiltration of the terminal ileum and colon likely reflect sequela of chronic inflammation. No evidence of acute bowel inflammation. Vascular/Lymphatic: Normal caliber abdominal aorta. Prominent retroperitoneal lymph nodes are similar dating back to August 04, 2018 consistent with a benign/indolent process. Reproductive: Uterus and bilateral adnexa are unremarkable in noncontrast CT appearance for reproductive age female. Other: Trace pelvic free fluid is within physiologic normal limits. Musculoskeletal: No acute osseous abnormality. IMPRESSION: 1. No hydronephrosis or evidence of obstructive uropathy. 2. Hepatic steatosis. 3. Cholelithiasis without findings of acute cholecystitis. 4. Fibrofatty infiltration of the terminal ileum and colon as can be seen with chronic inflammation. No evidence of acute bowel inflammation. Electronically Signed   By: Maudry Mayhew M.D.   On: 05/01/2022 12:59      Scheduled Meds:  chlordiazePOXIDE  25 mg Oral TID   Chlorhexidine Gluconate Cloth  6 each Topical Daily   cloNIDine  0.1 mg Oral BID   folic acid  1 mg Oral Daily   hydrALAZINE  25 mg Oral Q8H   metoCLOPramide (REGLAN) injection  10 mg Intravenous Q6H   multivitamin with minerals  1 tablet Oral Daily   nicotine  21 mg Transdermal Daily   pantoprazole  40 mg Oral Daily   potassium chloride  40 mEq Oral Q4H   thiamine  100 mg Oral Daily   Or   thiamine  100 mg Intravenous Daily   Continuous Infusions:  niCARDipine Stopped (05/02/22 1105)     LOS: 0 days     Noralee Stain, DO [facility] 05/02/2022, 11:43 AM   Available via Epic secure chat 7am-7pm After these hours, please refer to coverage provider listed on [email or web address]

## 2022-05-02 NOTE — Progress Notes (Signed)
Patient developed a fever tonight. No other SIRS criteria or new symptoms. Plan to obtain blood cultures and continue close monitoring.

## 2022-05-03 LAB — BASIC METABOLIC PANEL
Anion gap: 7 (ref 5–15)
BUN: 12 mg/dL (ref 6–20)
CO2: 24 mmol/L (ref 22–32)
Calcium: 8.3 mg/dL — ABNORMAL LOW (ref 8.9–10.3)
Chloride: 102 mmol/L (ref 98–111)
Creatinine, Ser: 0.49 mg/dL (ref 0.44–1.00)
GFR, Estimated: >60 mL/min (ref >60)
Glucose, Bld: 114 mg/dL — ABNORMAL HIGH (ref 70–99)
Potassium: 3.7 mmol/L (ref 3.5–5.1)
Sodium: 133 mmol/L — ABNORMAL LOW (ref 135–145)

## 2022-05-03 LAB — MAGNESIUM: Magnesium: 1.7 mg/dL (ref 1.7–2.4)

## 2022-05-03 LAB — HIV ANTIBODY (ROUTINE TESTING W REFLEX): HIV Screen 4th Generation wRfx: NONREACTIVE

## 2022-05-03 MED ORDER — AMLODIPINE BESYLATE 10 MG PO TABS
10.0000 mg | ORAL_TABLET | Freq: Every day | ORAL | Status: DC
  Administered 2022-05-03 – 2022-05-06 (×4): 10 mg via ORAL
  Filled 2022-05-03 (×4): qty 1

## 2022-05-03 MED ORDER — HYDROXYZINE HCL 25 MG PO TABS
25.0000 mg | ORAL_TABLET | Freq: Three times a day (TID) | ORAL | Status: DC | PRN
  Administered 2022-05-03 – 2022-05-11 (×13): 25 mg via ORAL
  Filled 2022-05-03 (×14): qty 1

## 2022-05-03 MED ORDER — DOCUSATE SODIUM 100 MG PO CAPS
100.0000 mg | ORAL_CAPSULE | Freq: Two times a day (BID) | ORAL | Status: DC
Start: 2022-05-03 — End: 2022-05-12
  Administered 2022-05-03 – 2022-05-10 (×16): 100 mg via ORAL
  Filled 2022-05-03 (×17): qty 1

## 2022-05-03 MED ORDER — FENTANYL CITRATE PF 50 MCG/ML IJ SOSY
25.0000 ug | PREFILLED_SYRINGE | INTRAMUSCULAR | Status: DC | PRN
  Administered 2022-05-03 – 2022-05-04 (×6): 25 ug via INTRAVENOUS
  Filled 2022-05-03 (×6): qty 1

## 2022-05-03 NOTE — Progress Notes (Addendum)
PROGRESS NOTE    [name]  FUX:[id number] DOB: [birth year] DOA: 05/01/2022 PCP: Patient, No Pcp Per     Brief Narrative:  [name] is a 36 y.o. female with medical history significant of anxiety, liver cirrhosis, cocaine abuse, hypertension, unspecified renal disorder, history of other nonhemorrhagic CVA, ventral hernia, constipation who is coming to the emergency department due to abdominal pain associated with nausea, dry heaving and constipation for the past 3 days.  On presentation to the emergency department, patient was hypertensive with BP up to 189/128.  She was started on nicardipine drip.  CT abdomen pelvis as well as pelvic ultrasound was completed without clear etiology of abdominal pain found.  New events last 24 hours / Subjective: Pain is mildly improved but still present.  No appetite.  Was able to eat a few bites of breakfast.  States that her last alcoholic beverage was 9/7.  Feels like she is starting to have withdrawal symptoms.  Had hard bowel movements this morning  Assessment & Plan:  Principal Problem:   Hypertensive urgency Active Problems:   Alcohol withdrawal (HCC)   Substance abuse (HCC)   Hepatic steatosis   Abdominal pain   Nausea and vomiting   Cocaine abuse (HCC)   Alcohol dependence (HCC)   Hypokalemia   Hypertensive urgency without organ failure -Weaned off nicardipine -Continue clonidine (patient has run out).  She tells me that she has not taken propranolol for many years.  Start on hydralazine, Norvasc  Abdominal pain, nausea, vomiting -Unclear etiology, could be constipation versus GYN issues such as endometriosis versus chronic inflammation of terminal ileum  -Pelvic ultrasound unremarkable -CT abdomen pelvis unremarkable, did show fatty infiltration terminal ileum and colon, can be seen with chronic inflammation  -Lactic acid is negative, unlikely to be mesenteric ischemia -UA unremarkable  Alcohol dependence -Librium 3  times daily for withdrawal  Substance abuse -UDS is negative for cocaine, admitted last cocaine use 2 weeks ago -UDS positive for opiate  Tobacco abuse -Nicotine patch  Fever -No infectious etiology found.  Patient denies significant cough, dysuria.  Could be secondary to alcohol withdrawal versus GI -Blood cultures obtained   DVT prophylaxis:  SCDs Start: 05/01/22 1512  Code Status: Full code Family Communication: Spoke with mom over the phone Disposition Plan:  Status is: Inpatient Remains inpatient appropriate because: Monitor fever and blood culture, alcohol withdrawal, abdominal pain    Antimicrobials:  Anti-infectives (From admission, onward)    None        Objective: Vitals:   05/02/22 1848 05/02/22 2033 05/02/22 2326 05/03/22 0426  BP: 138/87 (!) 147/90 (!) 145/86 (!) 175/105  Pulse: 99 99 93 99  Resp: 18 18 20 20   Temp: 99.6 F (37.6 C) (!) 101.4 F (38.6 C) 98.5 F (36.9 C) 100 F (37.8 C)  TempSrc: Oral Oral Oral Oral  SpO2: 98% 96% 99% 97%  Weight:      Height:        Intake/Output Summary (Last 24 hours) at 05/03/2022 0929 Last data filed at 05/03/2022 0500 Gross per 24 hour  Intake 1006.89 ml  Output 1200 ml  Net -193.11 ml    Filed Weights   05/01/22 1017 05/01/22 1633  Weight: 63.5 kg 72 kg    Examination:  General exam: Appears calm and comfortable  Respiratory system: Clear to auscultation. Respiratory effort normal. No respiratory distress. No conversational dyspnea.  Cardiovascular system: S1 & S2 heard, RRR. No murmurs. No pedal edema. Gastrointestinal system: Abdomen is  nondistended, soft, tender to palpation right lower quadrant Central nervous system: Alert and oriented. No focal neurological deficits. Speech clear.  Extremities: Symmetric in appearance  Skin: No rashes, lesions or ulcers on exposed skin  Psychiatry: Judgement and insight appear normal. Mood & affect appropriate.   Data Reviewed: I have personally  reviewed following labs and imaging studies  CBC: Recent Labs  Lab 05/01/22 1025 05/02/22 0306  WBC 11.1* 9.7  HGB 16.0* 12.8  HCT 47.6* 38.1  MCV 89.5 90.5  PLT 199 178    Basic Metabolic Panel: Recent Labs  Lab 05/01/22 1025 05/01/22 1038 05/02/22 0306 05/03/22 0530  NA 132*  --  132* 133*  K 3.2*  --  3.2* 3.7  CL 97*  --  101 102  CO2 22  --  23 24  GLUCOSE 115*  --  165* 114*  BUN 15  --  8 12  CREATININE 0.78  --  0.54 0.49  CALCIUM 9.6  --  8.5* 8.3*  MG  --  2.1  --  1.7  PHOS  --  3.5  --   --     GFR: Estimated Creatinine Clearance: 84.4 mL/min (by C-G formula based on SCr of 0.49 mg/dL). Liver Function Tests: Recent Labs  Lab 05/01/22 1025 05/02/22 0306  AST 17 17  ALT 19 14  ALKPHOS 97 75  BILITOT 1.1 0.7  PROT 9.0* 7.0  ALBUMIN 4.4 3.5    Recent Labs  Lab 05/01/22 1025  LIPASE 39    No results for input(s): "AMMONIA" in the last 168 hours. Coagulation Profile: No results for input(s): "INR", "PROTIME" in the last 168 hours. Cardiac Enzymes: No results for input(s): "CKTOTAL", "CKMB", "CKMBINDEX", "TROPONINI" in the last 168 hours. BNP (last 3 results) No results for input(s): "PROBNP" in the last 8760 hours. HbA1C: No results for input(s): "HGBA1C" in the last 72 hours. CBG: No results for input(s): "GLUCAP" in the last 168 hours. Lipid Profile: No results for input(s): "CHOL", "HDL", "LDLCALC", "TRIG", "CHOLHDL", "LDLDIRECT" in the last 72 hours. Thyroid Function Tests: No results for input(s): "TSH", "T4TOTAL", "FREET4", "T3FREE", "THYROIDAB" in the last 72 hours. Anemia Panel: No results for input(s): "VITAMINB12", "FOLATE", "FERRITIN", "TIBC", "IRON", "RETICCTPCT" in the last 72 hours. Sepsis Labs: Recent Labs  Lab 05/02/22 8416  LATICACIDVEN 1.6     Recent Results (from the past 240 hour(s))  MRSA Next Gen by PCR, Nasal     Status: None   Collection Time: 05/01/22  5:17 PM   Specimen: Nasal Mucosa; Nasal Swab  Result  Value Ref Range Status   MRSA by PCR Next Gen NOT DETECTED NOT DETECTED Final    Comment: (NOTE) The GeneXpert MRSA Assay (FDA approved for NASAL specimens only), is one component of a comprehensive MRSA colonization surveillance program. It is not intended to diagnose MRSA infection nor to guide or monitor treatment for MRSA infections. Test performance is not FDA approved in patients less than 53 years old. Performed at [facility], [address] [place], [place] [address]       Radiology Studies: US Pelvis Complete  Result Date: 05/01/2022 CLINICAL DATA:  Pelvic pain EXAM: TRANSABDOMINAL AND TRANSVAGINAL ULTRASOUND OF PELVIS DOPPLER ULTRASOUND OF OVARIES TECHNIQUE: Both transabdominal and transvaginal ultrasound examinations of the pelvis were performed. Transabdominal technique was performed for global imaging of the pelvis including uterus, ovaries, adnexal regions, and pelvic cul-de-sac. It was necessary to proceed with endovaginal exam following the transabdominal exam to visualize the  endometrium and ovaries. Color and duplex Doppler ultrasound was utilized to evaluate blood flow to the ovaries. COMPARISON:  CT abdomen 05/01/2022 FINDINGS: Uterus Measurements: 9.4 x 5 x 5.8 cm = volume: 140.8 mL. No fibroids or other mass visualized. Endometrium Thickness: 10 mm.  No focal abnormality visualized. Right ovary Measurements: 3.3 x 2.3 x 3.1 cm = volume: 12 mL. Normal appearance/no adnexal mass. Left ovary Measurements: 3 x 2.5 x 3.1 cm = volume: 12 mL. Normal appearance/no adnexal mass. Pulsed Doppler evaluation of both ovaries demonstrates normal low-resistance arterial and venous waveforms. Other findings No abnormal free fluid. IMPRESSION: 1. Normal pelvic ultrasound. 2. No sonographic findings to explain the patient's pelvic pain. Electronically Signed   By: Elige Ko M.D.   On: 05/01/2022 14:30   US Transvaginal Non-OB  Result Date: 05/01/2022 CLINICAL DATA:   Pelvic pain EXAM: TRANSABDOMINAL AND TRANSVAGINAL ULTRASOUND OF PELVIS DOPPLER ULTRASOUND OF OVARIES TECHNIQUE: Both transabdominal and transvaginal ultrasound examinations of the pelvis were performed. Transabdominal technique was performed for global imaging of the pelvis including uterus, ovaries, adnexal regions, and pelvic cul-de-sac. It was necessary to proceed with endovaginal exam following the transabdominal exam to visualize the endometrium and ovaries. Color and duplex Doppler ultrasound was utilized to evaluate blood flow to the ovaries. COMPARISON:  CT abdomen 05/01/2022 FINDINGS: Uterus Measurements: 9.4 x 5 x 5.8 cm = volume: 140.8 mL. No fibroids or other mass visualized. Endometrium Thickness: 10 mm.  No focal abnormality visualized. Right ovary Measurements: 3.3 x 2.3 x 3.1 cm = volume: 12 mL. Normal appearance/no adnexal mass. Left ovary Measurements: 3 x 2.5 x 3.1 cm = volume: 12 mL. Normal appearance/no adnexal mass. Pulsed Doppler evaluation of both ovaries demonstrates normal low-resistance arterial and venous waveforms. Other findings No abnormal free fluid. IMPRESSION: 1. Normal pelvic ultrasound. 2. No sonographic findings to explain the patient's pelvic pain. Electronically Signed   By: Elige Ko M.D.   On: 05/01/2022 14:30   [place] Art/Ven Flow Abd Pelv Doppler  Result Date: 05/01/2022 CLINICAL DATA:  Pelvic pain EXAM: TRANSABDOMINAL AND TRANSVAGINAL ULTRASOUND OF PELVIS DOPPLER ULTRASOUND OF OVARIES TECHNIQUE: Both transabdominal and transvaginal ultrasound examinations of the pelvis were performed. Transabdominal technique was performed for global imaging of the pelvis including uterus, ovaries, adnexal regions, and pelvic cul-de-sac. It was necessary to proceed with endovaginal exam following the transabdominal exam to visualize the endometrium and ovaries. Color and duplex Doppler ultrasound was utilized to evaluate blood flow to the ovaries. COMPARISON:  CT abdomen 05/01/2022  FINDINGS: Uterus Measurements: 9.4 x 5 x 5.8 cm = volume: 140.8 mL. No fibroids or other mass visualized. Endometrium Thickness: 10 mm.  No focal abnormality visualized. Right ovary Measurements: 3.3 x 2.3 x 3.1 cm = volume: 12 mL. Normal appearance/no adnexal mass. Left ovary Measurements: 3 x 2.5 x 3.1 cm = volume: 12 mL. Normal appearance/no adnexal mass. Pulsed Doppler evaluation of both ovaries demonstrates normal low-resistance arterial and venous waveforms. Other findings No abnormal free fluid. IMPRESSION: 1. Normal pelvic ultrasound. 2. No sonographic findings to explain the patient's pelvic pain. Electronically Signed   By: Elige Ko M.D.   On: 05/01/2022 14:30   CT Abdomen Pelvis Wo Contrast  Result Date: 05/01/2022 CLINICAL DATA:  Three days of lower abdominal pain. Positive hematuria. Prior history of tubal ligation. EXAM: CT ABDOMEN AND PELVIS WITHOUT CONTRAST TECHNIQUE: Multidetector CT imaging of the abdomen and pelvis was performed following the standard protocol without IV contrast. RADIATION DOSE REDUCTION: This exam was performed  according to the departmental dose-optimization program which includes automated exposure control, adjustment of the mA and/or kV according to patient size and/or use of iterative reconstruction technique. COMPARISON:  August 04, 2018 FINDINGS: Lower chest: No acute abnormality. Hepatobiliary: Diffuse hepatic steatosis. Subtle hypodensity along the anterior aspect of the liver measures 7 mm on image 14/2 and while technically too small to accurately characterize but is stable dating back to August 04, 2018 consistent with a benign finding. Cholelithiasis without findings of acute cholecystitis. No biliary ductal dilation. Pancreas: No pancreatic ductal dilation or evidence of acute inflammation. Spleen: No splenomegaly. Adrenals/Urinary Tract: Mild thickening of the bilateral adrenal glands without discrete nodularity, favor hyperplasia. No hydronephrosis. No  renal, ureteral or bladder calculi. Urinary bladder is unremarkable for degree of distension. Stomach/Bowel: Hyperdense material in the colon traversing the rectum likely reflects ingested antidiarrheal agent. Stomach is unremarkable for degree of distension. No pathologic dilation of small or large bowel. The appendix appears normal. Colon is predominately decompressed limiting evaluation. Fibrofatty infiltration of the terminal ileum and colon likely reflect sequela of chronic inflammation. No evidence of acute bowel inflammation. Vascular/Lymphatic: Normal caliber abdominal aorta. Prominent retroperitoneal lymph nodes are similar dating back to August 04, 2018 consistent with a benign/indolent process. Reproductive: Uterus and bilateral adnexa are unremarkable in noncontrast CT appearance for reproductive age female. Other: Trace pelvic free fluid is within physiologic normal limits. Musculoskeletal: No acute osseous abnormality. IMPRESSION: 1. No hydronephrosis or evidence of obstructive uropathy. 2. Hepatic steatosis. 3. Cholelithiasis without findings of acute cholecystitis. 4. Fibrofatty infiltration of the terminal ileum and colon as can be seen with chronic inflammation. No evidence of acute bowel inflammation. Electronically Signed   By: Maudry Mayhew M.D.   On: 05/01/2022 12:59      Scheduled Meds:  chlordiazePOXIDE  25 mg Oral TID   Chlorhexidine Gluconate Cloth  6 each Topical Daily   cloNIDine  0.1 mg Oral BID   folic acid  1 mg Oral Daily   hydrALAZINE  25 mg Oral Q8H   metoCLOPramide (REGLAN) injection  10 mg Intravenous Q6H   multivitamin with minerals  1 tablet Oral Daily   nicotine  21 mg Transdermal Daily   pantoprazole  40 mg Oral Daily   thiamine  100 mg Oral Daily   Or   thiamine  100 mg Intravenous Daily   Continuous Infusions:     LOS: 1 day     Noralee Stain, DO [facility] 05/03/2022, 9:29 AM   Available via Epic secure chat 7am-7pm After these  hours, please refer to coverage provider listed on [email or web address]

## 2022-05-03 NOTE — Progress Notes (Signed)
   05/03/22 2044  Assess: MEWS Score  Temp (!) 103.1 F (39.5 C)  BP (!) 156/92  MAP (mmHg) 108  Pulse Rate (!) 120  Resp 20  SpO2 95 %  O2 Device Room Air  Assess: MEWS Score  MEWS Temp 2  MEWS Systolic 0  MEWS Pulse 2  MEWS RR 0  MEWS LOC 0  MEWS Score 4  MEWS Score Color Red  Treat  MEWS Interventions Administered scheduled meds/treatments;Administered prn meds/treatments  Pain Intervention(s) Medication (See eMAR)  Notify: Charge Nurse/RN  Name of Charge Nurse/RN Notified Pam, RN  Date Charge Nurse/RN Notified 05/03/22  Time Charge Nurse/RN Notified 2127  Assess: SIRS CRITERIA  SIRS Temperature  1  SIRS Pulse 1  SIRS Respirations  0  SIRS WBC 1  SIRS Score Sum  3   MD aware of fever related to withdrawal symptoms historically for this pt. Charge RN, Pam notified of Red MEWS. Administered scheduled hydralazine, PRN tylenol, and PRN fentanyl for pain.

## 2022-05-04 ENCOUNTER — Encounter (HOSPITAL_COMMUNITY): Payer: Self-pay | Admitting: Internal Medicine

## 2022-05-04 ENCOUNTER — Inpatient Hospital Stay (HOSPITAL_COMMUNITY): Payer: Self-pay

## 2022-05-04 LAB — BASIC METABOLIC PANEL
Anion gap: 11 (ref 5–15)
BUN: 18 mg/dL (ref 6–20)
CO2: 24 mmol/L (ref 22–32)
Calcium: 8.9 mg/dL (ref 8.9–10.3)
Chloride: 101 mmol/L (ref 98–111)
Creatinine, Ser: 0.79 mg/dL (ref 0.44–1.00)
GFR, Estimated: >60 mL/min (ref >60)
Glucose, Bld: 135 mg/dL — ABNORMAL HIGH (ref 70–99)
Potassium: 3.4 mmol/L — ABNORMAL LOW (ref 3.5–5.1)
Sodium: 136 mmol/L (ref 135–145)

## 2022-05-04 LAB — LACTIC ACID, PLASMA
Lactic Acid, Venous: 1 mmol/L (ref 0.5–1.9)
Lactic Acid, Venous: 1 mmol/L (ref 0.5–1.9)

## 2022-05-04 LAB — CBC
HCT: 35.3 % — ABNORMAL LOW (ref 36.0–46.0)
Hemoglobin: 11.4 g/dL — ABNORMAL LOW (ref 12.0–15.0)
MCH: 30.2 pg (ref 26.0–34.0)
MCHC: 32.3 g/dL (ref 30.0–36.0)
MCV: 93.6 fL (ref 80.0–100.0)
Platelets: 178 10*3/uL (ref 150–400)
RBC: 3.77 MIL/uL — ABNORMAL LOW (ref 3.87–5.11)
RDW: 15.7 % — ABNORMAL HIGH (ref 11.5–15.5)
WBC: 7.7 10*3/uL (ref 4.0–10.5)
nRBC: 0 % (ref 0.0–0.2)

## 2022-05-04 MED ORDER — METRONIDAZOLE 500 MG/100ML IV SOLN
500.0000 mg | Freq: Two times a day (BID) | INTRAVENOUS | Status: DC
  Administered 2022-05-04 – 2022-05-11 (×14): 500 mg via INTRAVENOUS
  Filled 2022-05-04 (×14): qty 100

## 2022-05-04 MED ORDER — SODIUM CHLORIDE (PF) 0.9 % IJ SOLN
INTRAMUSCULAR | Status: AC
  Filled 2022-05-04: qty 50

## 2022-05-04 MED ORDER — CIPROFLOXACIN IN D5W 400 MG/200ML IV SOLN
400.0000 mg | Freq: Two times a day (BID) | INTRAVENOUS | Status: DC
  Administered 2022-05-04 – 2022-05-11 (×15): 400 mg via INTRAVENOUS
  Filled 2022-05-04 (×15): qty 200

## 2022-05-04 MED ORDER — METRONIDAZOLE 500 MG/100ML IV SOLN: 500.0000 mg | Freq: Two times a day (BID) | INTRAVENOUS | Status: DC

## 2022-05-04 MED ORDER — METRONIDAZOLE 500 MG/100ML IV SOLN
500.0000 mg | Freq: Once | INTRAVENOUS | Status: AC
  Administered 2022-05-04: 500 mg via INTRAVENOUS
  Filled 2022-05-04: qty 100

## 2022-05-04 MED ORDER — HYDROMORPHONE HCL 1 MG/ML IJ SOLN
1.0000 mg | INTRAMUSCULAR | Status: DC | PRN
  Administered 2022-05-04 – 2022-05-07 (×20): 1 mg via INTRAVENOUS
  Filled 2022-05-04 (×20): qty 1

## 2022-05-04 MED ORDER — IOHEXOL 350 MG/ML SOLN
100.0000 mL | Freq: Once | INTRAVENOUS | Status: AC | PRN
  Administered 2022-05-04: 100 mL via INTRAVENOUS

## 2022-05-04 MED ORDER — POTASSIUM CHLORIDE CRYS ER 20 MEQ PO TBCR
40.0000 meq | EXTENDED_RELEASE_TABLET | Freq: Once | ORAL | Status: AC
  Administered 2022-05-04: 40 meq via ORAL
  Filled 2022-05-04: qty 2

## 2022-05-04 NOTE — Progress Notes (Signed)
Patient ID: [name], female   DOB: [birth year], 36 y.o.   MRN: [id number] Asked by [facility] to review latest CT for poss asp/drainage of abd/pelvic fluid collection. Images were reviewed by Dr. Archer Asa. Pt currently has no target for aspiration at this time. [facility] PA notified.

## 2022-05-04 NOTE — Consult Note (Signed)
[name] June 14, 1986  [id number].    Requesting MD: Alvino Chapel, MD Chief Complaint/Reason for Consult: pelvic abscesses   HPI:  [name] is a 36 y/o F with a history of anxiety, hypertension, and polysubstance abuse who presented to the emergency department with a chief complaint of severe abdominal pain.  Patient reports 1 week of progressively worsening abdominal pain, pain is worse in the right lower quadrant, it is associated with fever, nausea, vomiting, dry heaves.  For the last 3 to 4 years the patient endorses intermittent, right lower quadrant pain that usually resolves with time.  This time, the pain got progressively worse making it hard to stand up straight.  She also reports daily vomiting for the past 2 to 3 years.  She denies hematochezia, melena, hematemesis.  She reports a history of gastric ulcers in the past.  She denies any history of colonoscopy or known personal history of inflammatory bowel disease, however her brother, mother, and maternal grandmother all have Crohn's disease.  At baseline she does report constipation-she may go a few days without having a bowel movement, then her mother will give her a dose of Linzess, and she will have multiple bowel movements in a day.  Social History: states she is currently unemployed.  Reports smoking 0.5-1.0 ppd cigarettes.  Reports drinking 2, 40 ozbeers, and 2 smaller beers daily.  Reports intermittent cocaine use, last used 2 weeks ago.  She denies IV drug use or narcotic use.  Denies use of marijuana, but states the people around her do smoke a lot so she might get a contact high.  Surgical History: Patient has a history of tubal pregnancy, complicated by bleeding and infection, she states this required 3 surgeries.  Following her tubal pregnancy she has had 5 C-sections.  Also reports an emergent ventral hernia repair 4 years ago (ventral hernia repair with mesh 09/12/2016 by Dr. Derrell Lolling)  She is currently sexually  active, reports with 1 partner, and is agreeable to STD testing today.  She is currently on her period, states she just finished her last menstrual cycle 2 weeks ago.  ROS: As above Review of Systems  All other systems reviewed and are negative.   Family History  Problem Relation Age of Onset   CAD Other    Diabetes Other    Hypertension Other    Cancer Other    Thyroid disease Other     Past Medical History:  Diagnosis Date   Anxiety    Cirrhosis of liver (HCC)    Cocaine use 08/04/2018   History of cocaine use 08/04/2018   Hypertension    Polysubstance abuse (HCC)    Renal disorder    Kidney Infection     Past Surgical History:  Procedure Laterality Date   ARTERY REPAIR     CESAREAN SECTION     x 5   MOUTH SURGERY     TEAR DUCT PROBING     TUBAL LIGATION     VENTRAL HERNIA REPAIR N/A 10/03/2016   Procedure: OPEN INCARCERATED HERNIA REPAIR VENTRAL ADULT;  Surgeon: Axel Filler, MD;  Location: [facility] OR;  Service: General;  Laterality: N/A;    Social History:  reports that she has been smoking cigarettes. She has been smoking an average of .5 packs per day. She has never used smokeless tobacco. She reports current alcohol use. She reports that she does not currently use drugs after having used the following drugs: Cocaine.  Allergies:  Allergies  Allergen Reactions   Ibuprofen Anaphylaxis, Swelling and Other (See Comments)    Patient thinks it may have caused her throat to "swell"  (** PATIENT HAS BEEN TOLERATING THIS IN 2023**)   Latex Shortness Of Breath   Peanut-Containing Drug Products Anaphylaxis   Penicillins Anaphylaxis and Hives    Has patient had a PCN reaction causing immediate rash, facial/tongue/throat swelling, SOB or lightheadedness with hypotension: Yes Has patient had a PCN reaction causing severe rash involving mucus membranes or skin necrosis: unknown Has patient had a PCN reaction that required hospitalization No Has patient had a PCN reaction  occurring within the last 10 years: No If all of the above answers are "NO", then may proceed with Cephalosporin use. Tolerated ceftriaxone 2018 & 2019    Acetaminophen Other (See Comments)    Causes patient to have an upset stomach- Tolerating this in 2023 (325 mg tablets)   Codeine Hives and Itching   Lisinopril Swelling   Ativan [Lorazepam] Other (See Comments)    "Hallucinations," per patient    Medications Prior to Admission  Medication Sig Dispense Refill   acetaminophen (TYLENOL) 325 MG tablet Take 325-650 mg by mouth every 6 (six) hours as needed for mild pain or headache.     cloNIDine (CATAPRES) 0.1 MG tablet Take 1 tablet (0.1 mg total) by mouth 2 (two) times daily. 60 tablet 0   ibuprofen (ADVIL) 200 MG tablet Take 200-400 mg by mouth every 6 (six) hours as needed for headache or mild pain.     chlordiazePOXIDE (LIBRIUM) 25 MG capsule Take 1 capsule (25 mg total) by mouth 3 (three) times daily as needed for withdrawal. (Patient not taking: Reported on 05/01/2022) 20 capsule 0   omeprazole (PRILOSEC) 40 MG capsule Take 1 capsule (40 mg total) by mouth daily. (Patient not taking: Reported on 05/01/2022) 30 capsule 1   propranolol (INDERAL) 40 MG tablet Take 1 tablet (40 mg total) by mouth 2 (two) times daily. (Patient not taking: Reported on 05/01/2022) 60 tablet 0     Physical Exam: Blood pressure 127/78, pulse 90, temperature 99.8 F (37.7 C), temperature source Oral, resp. rate 16, height 5' (1.524 m), weight 72 kg, last menstrual period 05/01/2022, SpO2 97 %. General: Pleasant white female  laying on hospital bed, appears stated age, NAD. HEENT: head -normocephalic, atraumatic; Eyes: PERRLA, anicteric sclerae Neck- Trachea is midline, no thyromegaly or JVD appreciated.  CV- RRR, normal S1/S2, no M/R/G, no lower extremity edema  Pulm- breathing is non-labored ORA. Abd- soft, tender in the epigastric region as well as her right hemiabdomen, worse over the RLQ where she is tender  to light palpation. There is no guarding or peritonitis. Lower midline laparotomy scar appears well healing. GU- deferred  MSK- UE/LE symmetrical, no cyanosis, clubbing, or edema. Neuro-nonfocal exam, normal gait -I watched her move from the bed stand and take a few steps to the chair. Psych- Alert and Oriented x3 with appropriate affect Skin: warm and dry, no rashes or lesions   Results for orders placed or performed during the hospital encounter of 05/01/22 (from the past 48 hour(s))  Basic metabolic panel     Status: Abnormal   Collection Time: 05/03/22  5:30 AM  Result Value Ref Range   Sodium 133 (L) 135 - 145 mmol/L   Potassium 3.7 3.5 - 5.1 mmol/L   Chloride 102 98 - 111 mmol/L   CO2 24 22 - 32 mmol/L   Glucose, Bld 114 (H) 70 - 99 mg/dL  Comment: Glucose reference range applies only to samples taken after fasting for at least 8 hours.   BUN 12 6 - 20 mg/dL   Creatinine, Ser 5.05 0.44 - 1.00 mg/dL   Calcium 8.3 (L) 8.9 - 10.3 mg/dL   GFR, Estimated >39 >76 mL/min    Comment: (NOTE) Calculated using the CKD-EPI Creatinine Equation (2021)    Anion gap 7 5 - 15    Comment: Performed at [facility], [address], [place], [place] [address]  Magnesium     Status: None   Collection Time: 05/03/22  5:30 AM  Result Value Ref Range   Magnesium 1.7 1.7 - 2.4 mg/dL    Comment: Performed at [facility], [address], [place], [place] [address]  Culture, blood (Routine X 2) w Reflex to ID Panel     Status: None (Preliminary result)   Collection Time: 05/03/22  5:30 AM   Specimen: Right Antecubital; Blood  Result Value Ref Range   Specimen Description      RIGHT ANTECUBITAL BLOOD Performed at [facility], [address], [place], [place] [address]    Special Requests      AEROBIC BOTTLE ONLY Blood Culture results may not be optimal due to an inadequate volume of blood received in culture bottles Performed at [facility], [address], [place], [place] [address]    Culture      NO GROWTH < 24 HOURS Performed at [facility], [address], [place], [place] [address]    Report Status PENDING   Culture, blood (Routine X 2) w Reflex to ID Panel     Status: None (Preliminary result)   Collection Time: 05/03/22  5:31 AM   Specimen: Left Antecubital; Blood  Result Value Ref Range   Specimen Description      LEFT ANTECUBITAL BLOOD Performed at [facility], [address] [place], [place] [address]    Special Requests      AEROBIC BOTTLE ONLY Blood Culture adequate volume Performed at [facility], [address] [place], [place] [address]    Culture      NO GROWTH < 24 HOURS Performed at [facility], [address], [place], [place] [address]    Report Status PENDING   Basic metabolic panel     Status: Abnormal   Collection Time: 05/04/22  4:56 AM  Result Value Ref Range   Sodium 136 135 - 145 mmol/L   Potassium 3.4 (L) 3.5 - 5.1 mmol/L   Chloride 101 98 - 111 mmol/L   CO2 24 22 - 32 mmol/L   Glucose, Bld 135 (H) 70 - 99 mg/dL    Comment: Glucose reference range applies only to samples taken after fasting for at least 8 hours.   BUN 18 6 - 20 mg/dL   Creatinine, Ser 1.94 0.44 - 1.00 mg/dL   Calcium 8.9 8.9 - 17.4 mg/dL   GFR, Estimated >08 >14 mL/min    Comment: (NOTE) Calculated using the CKD-EPI Creatinine Equation (2021)    Anion gap 11 5 - 15    Comment: Performed at [facility], [address], [place], [place] [address]  CBC     Status: Abnormal   Collection Time: 05/04/22  9:48 AM  Result Value Ref Range   WBC 7.7 4.0 - 10.5 K/uL   RBC 3.77 (L) 3.87 - 5.11 MIL/uL   Hemoglobin 11.4 (L)  12.0 - 15.0 g/dL   HCT 16.1 (L) 09.6 - 04.5 %   MCV 93.6 80.0 - 100.0 fL   MCH 30.2 26.0 - 34.0 pg   MCHC 32.3 30.0 - 36.0 g/dL   RDW 40.9 (H) 81.1 - 91.4 %   Platelets 178 150 - 400 K/uL   nRBC 0.0 0.0 - 0.2 %    Comment:  Performed at [facility], [address], [place], [place] [address]  Lactic acid, plasma     Status: None   Collection Time: 05/04/22  9:48 AM  Result Value Ref Range   Lactic Acid, Venous 1.0 0.5 - 1.9 mmol/L    Comment: Performed at [facility], [address] [place], [place] [address]  Lactic acid, plasma     Status: None   Collection Time: 05/04/22  1:15 PM  Result Value Ref Range   Lactic Acid, Venous 1.0 0.5 - 1.9 mmol/L    Comment: Performed at [facility], [address] [place], [place] [address]   CT Angio Abd/Pel w/ and/or w/o  Result Date: 05/04/2022 CLINICAL DATA:  Mid and lower abdominal pain. Evaluate for acute mesenteric ischemia. EXAM: CTA ABDOMEN AND PELVIS WITHOUT AND WITH CONTRAST TECHNIQUE: Multidetector CT imaging of the abdomen and pelvis was performed using the standard protocol during bolus administration of intravenous contrast. Multiplanar reconstructed images and MIPs were obtained and reviewed to evaluate the vascular anatomy. RADIATION DOSE REDUCTION: This exam was performed according to the departmental dose-optimization program which includes automated exposure control, adjustment of the mA and/or kV according to patient size and/or use of iterative reconstruction technique. CONTRAST:  OMNIPAQUE IOHEXOL 350 MG/ML SOLN COMPARISON:  CT abdomen and pelvis  05/01/2022 and 08/04/2018 FINDINGS: VASCULAR Aorta: Normal caliber aorta without aneurysm, dissection, vasculitis or significant stenosis. Celiac: Patent without evidence of aneurysm, dissection, vasculitis or significant stenosis. Variant anatomy with only 2 main branches coming off the celiac trunk. The 2 main branches are the left gastric artery and the splenic artery. Common hepatic artery is coming off the left gastric artery in the expected location of an accessory left gastric artery. There is a small gastroduodenal artery. SMA: Patent without  evidence of aneurysm, dissection, vasculitis or significant stenosis. Renals: Both renal arteries are patent without evidence of aneurysm, dissection, vasculitis, fibromuscular dysplasia or significant stenosis. IMA: Patent without evidence of aneurysm, dissection, vasculitis or significant stenosis. Inflow: Patent without evidence of aneurysm, dissection, vasculitis or significant stenosis. Proximal Outflow: Bilateral common femoral and visualized portions of the superficial and profunda femoral arteries are patent without evidence of aneurysm, dissection, vasculitis or significant stenosis. Veins: Portal veins and main mesenteric veins are patent without filling defects. IVC and renal veins are patent. No gross abnormality to the iliac veins. Review of the MIP images confirms the above findings. NON-VASCULAR Lower chest: Mild dependent changes in the posterior right lower lobe. Otherwise, the lung bases are clear. Hepatobiliary: 8 mm hypodensity in the anterior liver is probably an incidental finding. Gallbladder is decompressed. No biliary dilatation. Pancreas: Unremarkable. No pancreatic ductal dilatation or surrounding inflammatory changes. Spleen: Normal in size without focal abnormality. Adrenals/Urinary Tract: Normal adrenal glands. Normal appearance of both kidneys hydronephrosis. 6 mm hypodensity in the right kidney lower pole on sequence 4, image 114 is too small to definitively characterize but likely represents a cyst. This structure does not require dedicated follow-up. No suspicious renal lesions. Mild wall thickening in the urinary bladder. Stomach/Bowel: Normal appearance of the  stomach. Question a small hiatal hernia. New fluid and inflammatory changes in the lower abdomen and in the pelvis. Inflammatory changes are centered around uterus and adnexal structures and near loops of small bowel in the lower abdomen. Mildly dilated loops of small bowel in the lower abdomen, largest measuring up to 3.3  cm. Inflammatory changes are also the near the sigmoid colon. Appendix is not confidently identified. Lymphatic: Mildly prominent retroperitoneal lymph nodes. Index lymph node between the aorta and IVC on sequence 11 image 100 measures 1.2 cm in the short axis and this lymph node measured approximately 0.7 cm on 08/04/2018. No significant lymph node enlargement in the pelvis. Reproductive: Fluid and stranding throughout the pelvis around the uterus and adnexal structures. No discrete adnexal lesion. Other: There is extensive stranding with a small amount of fluid throughout the lower abdomen and pelvis. Evidence for developing fluid collections in the pelvis. A collection along the left side of the pelvis on sequence 11 image 194 measures 4.8 x 2.7 cm. Evidence for small complex fluid collections in the pre rectal region on image 194. There is additional complex fluid between the uterus and right adnexal structures on image 172. Musculoskeletal: No acute bone abnormality. IMPRESSION: VASCULAR 1. Arterial structures are widely patent without significant atherosclerotic disease. No evidence for arterial stenosis. 2. Variant celiac artery anatomy as described. NON-VASCULAR 1. 1. New fluid and inflammatory changes in the lower abdomen and pelvis. These inflammatory changes are near the uterus, adnexa and loops of bowel. Inflammation source is unclear. Differential diagnosis includes enteritis, colitis and possibly pelvic inflammatory disease. Dilatation of some small bowel loops suggests that small bowel could be the inflammatory source. 2. New small complex fluid collections in the pelvis. Early or developing small abscess collections cannot be excluded. Recommend short-term follow-up CT to evaluate these fluid collections. 3. Slightly enlarged retroperitoneal lymph nodes as described. Findings are nonspecific but could be reactive. Recommend attention to these on follow-up imaging. 4. Mild wall thickening in the  urinary bladder is nonspecific due to incomplete distension and could be also be related to surrounding inflammatory changes. Electronically Signed   By: Richarda Overlie M.D.   On: 05/04/2022 14:19      Assessment/Plan RLQ abdominal pain Fever Pelvic abscesses  36 year old female who presents with 1 week of progressively worsening right lower quadrant abdominal pain, fever, vomiting.  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  CTA abdomen performed today shows development of multiple complex pelvic abscesses. - WBC 11.1 on admission >> 9.7 two days ago, before IV abx were started - TMAX 103.1 9/10 at 2044, at that time her HR was 120. Today she has been afebrile, HR 90's -  DDx: PID, inflammatory bowel disease (Crohn's), perforated appendicitis, enteritis/colitis  - I do not see any emergent indications for surgery. She is hemodynamically stable, without peritonitis, no pneumoperitoneum on her scan.  -  IV abx started by medical service, agree with this. -  STD testing ordered: gonorrhea/chlamydia, trichomoniasis, syphilis -  I asked IR to evaluate her CT and see if any fluid collections are amenable to aspiration for GS/Culture. Dr. Archer Asa reviewed and said there are no aspiration targets at this time. She may end up needing a repeat scan in a few days.  - given her strong family history of Crohn's disease, history of chronic RLQ abdominal pain with intermittent exacerbations, and irregular bowel habits, I am concerned should could have Crohn's. If  we can get her through this acute period of pelvic inflammatory changes/infection, she should get a colonoscopy to further evaluate - CCS will follow  FEN - clear liquid diet, IVF  VTE - SCD's, ok for DVT ppx from surgical perspective ID - cipro/flagyl (severe PCN allergy).  Admit - TRH service  HTN urgency Alcohol dependence Polysubstance abuse    I reviewed nursing notes, ED  provider notes, hospitalist notes, last 24 h vitals and pain scores, last 48 h intake and output, last 24 h labs and trends, and last 24 h imaging results.  Adam Phenix, [facility] Surgery 05/04/2022, 3:35 PM Please see Amion for pager number during day hours 7:00am-4:30pm or 7:00am -11:30am on weekends

## 2022-05-04 NOTE — Progress Notes (Signed)
Pharmacy Antibiotic Note  [name] is a 36 y.o. female admitted on 05/01/2022 with IAI.  Pharmacy has been consulted for cipro dosing.  Plan: Cipro 400 mg IV q12 Flagyl 500 mg IV q12 per MD Pharmacy to sign off  Height: 5' (152.4 cm) Weight: 72 kg (158 lb 11.7 oz) IBW/kg (Calculated) : 45.5  Temp (24hrs), Avg:100.4 F (38 C), Min:99.2 F (37.3 C), Max:103.1 F (39.5 C)  Recent Labs  Lab 05/01/22 1025 05/02/22 0306 05/02/22 0852 05/03/22 0530 05/04/22 0456 05/04/22 0948 05/04/22 1315  WBC 11.1* 9.7  --   --   --  7.7  --   CREATININE 0.78 0.54  --  0.49 0.79  --   --   LATICACIDVEN  --   --  1.6  --   --  1.0 1.0    Estimated Creatinine Clearance: 84.4 mL/min (by C-G formula based on SCr of 0.79 mg/dL).    Allergies  Allergen Reactions   Ibuprofen Anaphylaxis, Swelling and Other (See Comments)    Patient thinks it may have caused her throat to "swell"  (** PATIENT HAS BEEN TOLERATING THIS IN 2023**)   Latex Shortness Of Breath   Peanut-Containing Drug Products Anaphylaxis   Penicillins Anaphylaxis and Hives    Has patient had a PCN reaction causing immediate rash, facial/tongue/throat swelling, SOB or lightheadedness with hypotension: Yes Has patient had a PCN reaction causing severe rash involving mucus membranes or skin necrosis: unknown Has patient had a PCN reaction that required hospitalization No Has patient had a PCN reaction occurring within the last 10 years: No If all of the above answers are "NO", then may proceed with Cephalosporin use. Tolerated ceftriaxone 2018 & 2019    Acetaminophen Other (See Comments)    Causes patient to have an upset stomach- Tolerating this in 2023 (325 mg tablets)   Codeine Hives and Itching   Lisinopril Swelling   Ativan [Lorazepam] Other (See Comments)    "Hallucinations," per patient    Thank you for allowing pharmacy to be a part of this patient's care.  Herby Abraham, Pharm.D 05/04/2022 2:47 PM

## 2022-05-04 NOTE — Progress Notes (Addendum)
PROGRESS NOTE    [name]  QMG:[id number] DOB: 08-04-1986 DOA: 05/01/2022 PCP: Patient, No Pcp Per     Brief Narrative:  [name] is a 36 y.o. female with medical history significant of anxiety, liver cirrhosis, cocaine abuse, hypertension, unspecified renal disorder, history of other nonhemorrhagic CVA, ventral hernia, constipation who is coming to the emergency department due to abdominal pain associated with nausea, dry heaving and constipation for the past 3 days.  On presentation to the emergency department, patient was hypertensive with BP up to 189/128.  She was started on nicardipine drip.  CT abdomen pelvis as well as pelvic ultrasound was completed without clear etiology of abdominal pain found.  New events last 24 hours / Subjective: Continues to have severe abdominal pain, now pain is on the left side.  Abdomen is more distended today.  Asking for more pain medications.  Had a bowel movement, but unclear if it was bloody since patient is currently on her menstrual cycle.  Fever last night 103.1  Assessment & Plan:  Principal Problem:   Hypertensive urgency Active Problems:   Alcohol withdrawal (HCC)   Substance abuse (HCC)   Hepatic steatosis   Abdominal pain   Nausea and vomiting   Cocaine abuse (HCC)   Alcohol dependence (HCC)   Hypokalemia   Hypertensive urgency without organ failure -Weaned off nicardipine -Continue clonidine (patient has run out).  She tells me that she has not taken propranolol for many years.  Start on hydralazine, Norvasc  Intra-abdominal infection -Initially presented with abdominal pain, nausea and vomiting.  Work-up with CT abdomen pelvis, pelvic ultrasound was unremarkable on presentation. Lactic acid is negative x 2. UA unremarkable -CTA abdomen pelvis showed fluid and inflammatory changes in the lower abdomen and pelvis, new small complex fluid collections in the pelvis -Start Cipro, Flagyl -General surgery  consulted  Alcohol dependence -Librium 3 times daily for withdrawal  Substance abuse -UDS is negative for cocaine, admitted last cocaine use 2 weeks ago -UDS positive for opiate  Tobacco abuse -Nicotine patch  Hypokalemia -Replace   DVT prophylaxis:  SCDs Start: 05/01/22 1512  Code Status: Full code Family Communication: Mom bedside Disposition Plan:  Status is: Inpatient Remains inpatient appropriate because: Continued abdominal pain    Antimicrobials:  Anti-infectives (From admission, onward)    None        Objective: Vitals:   05/03/22 1259 05/03/22 2044 05/04/22 0007 05/04/22 0436  BP:  (!) 156/92 113/62 134/87  Pulse:  (!) 120 92 92  Resp:  20 20 20   Temp: 100 F (37.8 C) (!) 103.1 F (39.5 C) 99.2 F (37.3 C) 99.6 F (37.6 C)  TempSrc: Oral Oral Oral Oral  SpO2:  95% 97% 98%  Weight:      Height:        Intake/Output Summary (Last 24 hours) at 05/04/2022 1310 Last data filed at 05/03/2022 2145 Gross per 24 hour  Intake 100 ml  Output --  Net 100 ml    Filed Weights   05/01/22 1017 05/01/22 1633  Weight: 63.5 kg 72 kg    Examination:  General exam: Appears calm but uncomfortable  Respiratory system: Clear to auscultation. Respiratory effort normal. No respiratory distress. No conversational dyspnea.  Cardiovascular system: S1 & S2 heard, RRR. No murmurs. No pedal edema. Gastrointestinal system: Abdomen is mildly distended but soft, TTP lower abdomen  Central nervous system: Alert and oriented. No focal neurological deficits. Speech clear.  Extremities: Symmetric in appearance  Skin:  No rashes, lesions or ulcers on exposed skin  Psychiatry: Judgement and insight appear normal. Mood & affect appropriate.   Data Reviewed: I have personally reviewed following labs and imaging studies  CBC: Recent Labs  Lab 05/01/22 1025 05/02/22 0306 05/04/22 0948  WBC 11.1* 9.7 7.7  HGB 16.0* 12.8 11.4*  HCT 47.6* 38.1 35.3*  MCV 89.5 90.5 93.6   PLT 199 178 178    Basic Metabolic Panel: Recent Labs  Lab 05/01/22 1025 05/01/22 1038 05/02/22 0306 05/03/22 0530 05/04/22 0456  NA 132*  --  132* 133* 136  K 3.2*  --  3.2* 3.7 3.4*  CL 97*  --  101 102 101  CO2 22  --  23 24 24   GLUCOSE 115*  --  165* 114* 135*  BUN 15  --  8 12 18   CREATININE 0.78  --  0.54 0.49 0.79  CALCIUM 9.6  --  8.5* 8.3* 8.9  MG  --  2.1  --  1.7  --   PHOS  --  3.5  --   --   --     GFR: Estimated Creatinine Clearance: 84.4 mL/min (by C-G formula based on SCr of 0.79 mg/dL). Liver Function Tests: Recent Labs  Lab 05/01/22 1025 05/02/22 0306  AST 17 17  ALT 19 14  ALKPHOS 97 75  BILITOT 1.1 0.7  PROT 9.0* 7.0  ALBUMIN 4.4 3.5    Recent Labs  Lab 05/01/22 1025  LIPASE 39    No results for input(s): "AMMONIA" in the last 168 hours. Coagulation Profile: No results for input(s): "INR", "PROTIME" in the last 168 hours. Cardiac Enzymes: No results for input(s): "CKTOTAL", "CKMB", "CKMBINDEX", "TROPONINI" in the last 168 hours. BNP (last 3 results) No results for input(s): "PROBNP" in the last 8760 hours. HbA1C: No results for input(s): "HGBA1C" in the last 72 hours. CBG: No results for input(s): "GLUCAP" in the last 168 hours. Lipid Profile: No results for input(s): "CHOL", "HDL", "LDLCALC", "TRIG", "CHOLHDL", "LDLDIRECT" in the last 72 hours. Thyroid Function Tests: No results for input(s): "TSH", "T4TOTAL", "FREET4", "T3FREE", "THYROIDAB" in the last 72 hours. Anemia Panel: No results for input(s): "VITAMINB12", "FOLATE", "FERRITIN", "TIBC", "IRON", "RETICCTPCT" in the last 72 hours. Sepsis Labs: Recent Labs  Lab 05/02/22 0852 05/04/22 0948  LATICACIDVEN 1.6 1.0     Recent Results (from the past 240 hour(s))  MRSA Next Gen by PCR, Nasal     Status: None   Collection Time: 05/01/22  5:17 PM   Specimen: Nasal Mucosa; Nasal Swab  Result Value Ref Range Status   MRSA by PCR Next Gen NOT DETECTED NOT DETECTED Final     Comment: (NOTE) The GeneXpert MRSA Assay (FDA approved for NASAL specimens only), is one component of a comprehensive MRSA colonization surveillance program. It is not intended to diagnose MRSA infection nor to guide or monitor treatment for MRSA infections. Test performance is not FDA approved in patients less than 81 years old. Performed at [facility], [address] [place], [place]   Culture, blood (Routine X 2) w Reflex to ID Panel     Status: None (Preliminary result)   Collection Time: 05/03/22  5:30 AM   Specimen: Right Antecubital; Blood  Result Value Ref Range Status   Specimen Description   Final    RIGHT ANTECUBITAL BLOOD Performed at [facility], [address], [place], [address] [place]    Special Requests   Final  AEROBIC BOTTLE ONLY Blood Culture results may not be optimal due to an inadequate volume of blood received in culture bottles Performed at [facility], [address], [place], [place] [address]    Culture   Final    NO GROWTH < 24 HOURS Performed at [facility], [address], [place], [place] [address]    Report Status PENDING  Incomplete  Culture, blood (Routine X 2) w Reflex to ID Panel     Status: None (Preliminary result)   Collection Time: 05/03/22  5:31 AM   Specimen: Left Antecubital; Blood  Result Value Ref Range Status   Specimen Description   Final    LEFT ANTECUBITAL BLOOD Performed at [facility], [address], [place], [place] [address]    Special Requests   Final    AEROBIC BOTTLE ONLY Blood Culture adequate volume Performed at [facility] [address], [place], [place] [address]    Culture   Final    NO GROWTH < 24 HOURS Performed at [facility], [address] [place], [place] [address]    Report Status PENDING  Incomplete      Radiology Studies: No results found.    Scheduled Meds:  amLODipine  10 mg Oral Daily    chlordiazePOXIDE  25 mg Oral TID   Chlorhexidine Gluconate Cloth  6 each Topical Daily   cloNIDine  0.1 mg Oral BID   docusate sodium  100 mg Oral BID   folic acid  1 mg Oral Daily   hydrALAZINE  25 mg Oral Q8H   metoCLOPramide (REGLAN) injection  10 mg Intravenous Q6H   multivitamin with minerals  1 tablet Oral Daily   nicotine  21 mg Transdermal Daily   pantoprazole  40 mg Oral Daily   thiamine  100 mg Oral Daily   Or   thiamine  100 mg Intravenous Daily   Continuous Infusions:     LOS: 2 days     Noralee Stain, DO [facility] 05/04/2022, 1:10 PM   Available via Epic secure chat 7am-7pm After these hours, please refer to coverage provider listed on [email or web address]

## 2022-05-05 LAB — BASIC METABOLIC PANEL
Anion gap: 7 (ref 5–15)
BUN: 23 mg/dL — ABNORMAL HIGH (ref 6–20)
CO2: 23 mmol/L (ref 22–32)
Calcium: 8.6 mg/dL — ABNORMAL LOW (ref 8.9–10.3)
Chloride: 104 mmol/L (ref 98–111)
Creatinine, Ser: 0.85 mg/dL (ref 0.44–1.00)
GFR, Estimated: >60 mL/min (ref >60)
Glucose, Bld: 133 mg/dL — ABNORMAL HIGH (ref 70–99)
Potassium: 3.6 mmol/L (ref 3.5–5.1)
Sodium: 134 mmol/L — ABNORMAL LOW (ref 135–145)

## 2022-05-05 LAB — RPR: RPR Ser Ql: NONREACTIVE

## 2022-05-05 MED ORDER — DOXYCYCLINE HYCLATE 100 MG PO TABS
100.0000 mg | ORAL_TABLET | Freq: Two times a day (BID) | ORAL | Status: DC
  Administered 2022-05-05 – 2022-05-11 (×13): 100 mg via ORAL
  Filled 2022-05-05 (×13): qty 1

## 2022-05-05 MED ORDER — CHLORDIAZEPOXIDE HCL 5 MG PO CAPS
10.0000 mg | ORAL_CAPSULE | Freq: Three times a day (TID) | ORAL | Status: DC
  Administered 2022-05-05 – 2022-05-06 (×5): 10 mg via ORAL
  Filled 2022-05-05 (×5): qty 2

## 2022-05-05 NOTE — Progress Notes (Signed)
PROGRESS NOTE    [name]  [id number] DOB: 1986/03/21 DOA: 05/01/2022 PCP: Patient, No Pcp Per     Brief Narrative:  [name] is a 36 y.o. female with medical history significant of anxiety, liver cirrhosis, cocaine abuse, hypertension, unspecified renal disorder, history of other nonhemorrhagic CVA, ventral hernia, constipation who is coming to the emergency department due to abdominal pain associated with nausea, dry heaving and constipation for the past 3 days.  On presentation to the emergency department, patient was hypertensive with BP up to 189/128.  She was started on nicardipine drip.  CT abdomen pelvis as well as pelvic ultrasound was completed without clear etiology of abdominal pain found.  Due to fevers and continued abdominal pain, CTA abdomen pelvis was done which revealed fluid and inflammatory changes in the lower abdomen and pelvis, new small complex fluid collections in the pelvis.  Patient was started on Cipro/Flagyl and general surgery consulted.  New events last 24 hours / Subjective: No new changes today.  Continues to have abdominal pain.  Had a fever overnight 100.4.  Assessment & Plan:  Principal Problem:   Hypertensive urgency Active Problems:   Alcohol withdrawal (HCC)   Substance abuse (HCC)   Hepatic steatosis   Abdominal pain   Nausea and vomiting   Cocaine abuse (HCC)   Alcohol dependence ([place])   Hypokalemia   Hypertensive urgency without organ failure -Weaned off nicardipine -Continue clonidine (patient has run out).  She tells me that she has not taken propranolol for many years.  Start on hydralazine, Norvasc  Intra-abdominal infection -Initially presented with abdominal pain, nausea and vomiting.  Work-up with CT abdomen pelvis, pelvic ultrasound was unremarkable on presentation. Lactic acid is negative x 2. UA unremarkable -CTA abdomen pelvis showed fluid and inflammatory changes in the lower abdomen and pelvis, new small  complex fluid collections in the pelvis -Start Cipro, Flagyl -General surgery consulted -GI consulted due to family history of Crohn's disease -I spoke with GYN Dr. Elonda Husky for his recommendations. He recommended treating with cipro/flagyl and also adding doxycycline for total 10 day course for adequate PID treatment. This would cover bases for STDs as well. Follow up with GYN outpatient in 3 weeks.   Alcohol dependence -Librium 3 times daily for withdrawal - wean   Substance abuse -UDS is negative for cocaine, admitted last cocaine use 2 weeks ago -UDS positive for opiate  Tobacco abuse -Nicotine patch    DVT prophylaxis:  SCDs Start: 05/01/22 1512  Code Status: Full code Family Communication: Mom bedside Disposition Plan:  Status is: Inpatient Remains inpatient appropriate because: IV antibiotics    Antimicrobials:  Anti-infectives (From admission, onward)    Start     Dose/Rate Route Frequency Ordered Stop   05/04/22 2200  metroNIDAZOLE (FLAGYL) IVPB 500 mg        500 mg 100 mL/hr over 60 Minutes Intravenous Every 12 hours 05/04/22 1434     05/04/22 1530  metroNIDAZOLE (FLAGYL) IVPB 500 mg        500 mg 100 mL/hr over 60 Minutes Intravenous  Once 05/04/22 1434 05/04/22 1645   05/04/22 1530  ciprofloxacin (CIPRO) IVPB 400 mg        400 mg 200 mL/hr over 60 Minutes Intravenous 2 times daily 05/04/22 1440     05/04/22 1515  metroNIDAZOLE (FLAGYL) IVPB 500 mg  Status:  Discontinued        500 mg 100 mL/hr over 60 Minutes Intravenous Every 12 hours 05/04/22 1428  05/04/22 1434        Objective: Vitals:   05/04/22 0436 05/04/22 1401 05/04/22 1948 05/05/22 0343  BP: 134/87 127/78 118/79 114/73  Pulse: 92 90 93 89  Resp: 20 16 20 18   Temp: 99.6 F (37.6 C) 99.8 F (37.7 C) 98.1 F (36.7 C) (!) 100.4 F (38 C)  TempSrc: Oral Oral Oral Oral  SpO2: 98% 97% 95% 94%  Weight:      Height:        Intake/Output Summary (Last 24 hours) at 05/05/2022 1215 Last data  filed at 05/04/2022 2138 Gross per 24 hour  Intake 540 ml  Output --  Net 540 ml    Filed Weights   05/01/22 1017 05/01/22 1633  Weight: 63.5 kg 72 kg    Examination:  General exam: Appears calm  Respiratory system: Clear to auscultation. Respiratory effort normal. No respiratory distress. No conversational dyspnea.  Cardiovascular system: S1 & S2 heard, RRR. No murmurs. No pedal edema. Gastrointestinal system: Abdomen is mildly distended but soft, TTTP RLQ Central nervous system: Alert and oriented. No focal neurological deficits. Speech clear.  Extremities: Symmetric in appearance  Skin: No rashes, lesions or ulcers on exposed skin  Psychiatry: Judgement and insight appear normal. Mood & affect appropriate.   Data Reviewed: I have personally reviewed following labs and imaging studies  CBC: Recent Labs  Lab 05/01/22 1025 05/02/22 0306 05/04/22 0948  WBC 11.1* 9.7 7.7  HGB 16.0* 12.8 11.4*  HCT 47.6* 38.1 35.3*  MCV 89.5 90.5 93.6  PLT 199 178 0000000    Basic Metabolic Panel: Recent Labs  Lab 05/01/22 1025 05/01/22 1038 05/02/22 0306 05/03/22 0530 05/04/22 0456 05/05/22 0505  NA 132*  --  132* 133* 136 134*  K 3.2*  --  3.2* 3.7 3.4* 3.6  CL 97*  --  101 102 101 104  CO2 22  --  23 24 24 23   GLUCOSE 115*  --  165* 114* 135* 133*  BUN 15  --  8 12 18  23*  CREATININE 0.78  --  0.54 0.49 0.79 0.85  CALCIUM 9.6  --  8.5* 8.3* 8.9 8.6*  MG  --  2.1  --  1.7  --   --   PHOS  --  3.5  --   --   --   --     GFR: Estimated Creatinine Clearance: 79.5 mL/min (by C-G formula based on SCr of 0.85 mg/dL). Liver Function Tests: Recent Labs  Lab 05/01/22 1025 05/02/22 0306  AST 17 17  ALT 19 14  ALKPHOS 97 75  BILITOT 1.1 0.7  PROT 9.0* 7.0  ALBUMIN 4.4 3.5    Recent Labs  Lab 05/01/22 1025  LIPASE 39    No results for input(s): "AMMONIA" in the last 168 hours. Coagulation Profile: No results for input(s): "INR", "PROTIME" in the last 168 hours. Cardiac  Enzymes: No results for input(s): "CKTOTAL", "CKMB", "CKMBINDEX", "TROPONINI" in the last 168 hours. BNP (last 3 results) No results for input(s): "PROBNP" in the last 8760 hours. HbA1C: No results for input(s): "HGBA1C" in the last 72 hours. CBG: No results for input(s): "GLUCAP" in the last 168 hours. Lipid Profile: No results for input(s): "CHOL", "HDL", "LDLCALC", "TRIG", "CHOLHDL", "LDLDIRECT" in the last 72 hours. Thyroid Function Tests: No results for input(s): "TSH", "T4TOTAL", "FREET4", "T3FREE", "THYROIDAB" in the last 72 hours. Anemia Panel: No results for input(s): "VITAMINB12", "FOLATE", "FERRITIN", "TIBC", "IRON", "RETICCTPCT" in the last 72 hours. Sepsis Labs: Recent  Labs  Lab 05/02/22 0852 05/04/22 0948 05/04/22 1315  LATICACIDVEN 1.6 1.0 1.0     Recent Results (from the past 240 hour(s))  MRSA Next Gen by PCR, Nasal     Status: None   Collection Time: 05/01/22  5:17 PM   Specimen: Nasal Mucosa; Nasal Swab  Result Value Ref Range Status   MRSA by PCR Next Gen NOT DETECTED NOT DETECTED Final    Comment: (NOTE) The GeneXpert MRSA Assay (FDA approved for NASAL specimens only), is one component of a comprehensive MRSA colonization surveillance program. It is not intended to diagnose MRSA infection nor to guide or monitor treatment for MRSA infections. Test performance is not FDA approved in patients less than 83 years old. Performed at [facility], [address], [place], [place] [address]   Culture, blood (Routine X 2) w Reflex to ID Panel     Status: None (Preliminary result)   Collection Time: 05/03/22  5:30 AM   Specimen: Right Antecubital; Blood  Result Value Ref Range Status   Specimen Description   Final    RIGHT ANTECUBITAL BLOOD Performed at [facility], [address], [place], [place] [address]    Special Requests   Final    AEROBIC BOTTLE ONLY Blood Culture results may not be optimal due to an inadequate volume of  blood received in culture bottles Performed at [facility], [address] [place], [place] [address]    Culture   Final    NO GROWTH 2 DAYS Performed at [facility], [address], [place], [place] [address]    Report Status PENDING  Incomplete  Culture, blood (Routine X 2) w Reflex to ID Panel     Status: None (Preliminary result)   Collection Time: 05/03/22  5:31 AM   Specimen: Left Antecubital; Blood  Result Value Ref Range Status   Specimen Description   Final    LEFT ANTECUBITAL BLOOD Performed at [facility], [address] [place], [place] [address]    Special Requests   Final    AEROBIC BOTTLE ONLY Blood Culture adequate volume Performed at [facility], [address], [place], [place] [address]    Culture   Final    NO GROWTH 2 DAYS Performed at [facility], [address], [place], [place] [address]    Report Status PENDING  Incomplete      Radiology Studies: CT Angio Abd/Pel w/ and/or w/o  Result Date: 05/04/2022 CLINICAL DATA:  Mid and lower abdominal pain. Evaluate for acute mesenteric ischemia. EXAM: CTA ABDOMEN AND PELVIS WITHOUT AND WITH CONTRAST TECHNIQUE: Multidetector CT imaging of the abdomen and pelvis was performed using the standard protocol during bolus administration of intravenous contrast. Multiplanar reconstructed images and MIPs were obtained and reviewed to evaluate the vascular anatomy. RADIATION DOSE REDUCTION: This exam was performed according to the departmental dose-optimization program which includes automated exposure control, adjustment of the mA and/or kV according to patient size and/or use of iterative reconstruction technique. CONTRAST:  OMNIPAQUE IOHEXOL 350 MG/ML SOLN COMPARISON:  CT abdomen and pelvis  05/01/2022 and 08/04/2018 FINDINGS: VASCULAR Aorta: Normal caliber aorta without aneurysm, dissection, vasculitis or significant stenosis. Celiac: Patent without evidence  of aneurysm, dissection, vasculitis or significant stenosis. Variant anatomy with only 2 main branches coming off the celiac trunk. The 2 main branches are the left gastric artery and the splenic artery. Common hepatic artery is coming off the  left gastric artery in the expected location of an accessory left gastric artery. There is a small gastroduodenal artery. SMA: Patent without evidence of aneurysm, dissection, vasculitis or significant stenosis. Renals: Both renal arteries are patent without evidence of aneurysm, dissection, vasculitis, fibromuscular dysplasia or significant stenosis. IMA: Patent without evidence of aneurysm, dissection, vasculitis or significant stenosis. Inflow: Patent without evidence of aneurysm, dissection, vasculitis or significant stenosis. Proximal Outflow: Bilateral common femoral and visualized portions of the superficial and profunda femoral arteries are patent without evidence of aneurysm, dissection, vasculitis or significant stenosis. Veins: Portal veins and main mesenteric veins are patent without filling defects. IVC and renal veins are patent. No gross abnormality to the iliac veins. Review of the MIP images confirms the above findings. NON-VASCULAR Lower chest: Mild dependent changes in the posterior right lower lobe. Otherwise, the lung bases are clear. Hepatobiliary: 8 mm hypodensity in the anterior liver is probably an incidental finding. Gallbladder is decompressed. No biliary dilatation. Pancreas: Unremarkable. No pancreatic ductal dilatation or surrounding inflammatory changes. Spleen: Normal in size without focal abnormality. Adrenals/Urinary Tract: Normal adrenal glands. Normal appearance of both kidneys hydronephrosis. 6 mm hypodensity in the right kidney lower pole on sequence 4, image 114 is too small to definitively characterize but likely represents a cyst. This structure does not require dedicated follow-up. No suspicious renal lesions. Mild wall thickening in  the urinary bladder. Stomach/Bowel: Normal appearance of the stomach. Question a small hiatal hernia. New fluid and inflammatory changes in the lower abdomen and in the pelvis. Inflammatory changes are centered around uterus and adnexal structures and near loops of small bowel in the lower abdomen. Mildly dilated loops of small bowel in the lower abdomen, largest measuring up to 3.3 cm. Inflammatory changes are also the near the sigmoid colon. Appendix is not confidently identified. Lymphatic: Mildly prominent retroperitoneal lymph nodes. Index lymph node between the aorta and IVC on sequence 11 image 100 measures 1.2 cm in the short axis and this lymph node measured approximately 0.7 cm on 08/04/2018. No significant lymph node enlargement in the pelvis. Reproductive: Fluid and stranding throughout the pelvis around the uterus and adnexal structures. No discrete adnexal lesion. Other: There is extensive stranding with a small amount of fluid throughout the lower abdomen and pelvis. Evidence for developing fluid collections in the pelvis. A collection along the left side of the pelvis on sequence 11 image 194 measures 4.8 x 2.7 cm. Evidence for small complex fluid collections in the pre rectal region on image 194. There is additional complex fluid between the uterus and right adnexal structures on image 172. Musculoskeletal: No acute bone abnormality. IMPRESSION: VASCULAR 1. Arterial structures are widely patent without significant atherosclerotic disease. No evidence for arterial stenosis. 2. Variant celiac artery anatomy as described. NON-VASCULAR 1. 1. New fluid and inflammatory changes in the lower abdomen and pelvis. These inflammatory changes are near the uterus, adnexa and loops of bowel. Inflammation source is unclear. Differential diagnosis includes enteritis, colitis and possibly pelvic inflammatory disease. Dilatation of some small bowel loops suggests that small bowel could be the inflammatory source. 2.  New small complex fluid collections in the pelvis. Early or developing small abscess collections cannot be excluded. Recommend short-term follow-up CT to evaluate these fluid collections. 3. Slightly enlarged retroperitoneal lymph nodes as described. Findings are nonspecific but could be reactive. Recommend attention to these on follow-up imaging. 4. Mild wall thickening in the urinary bladder is nonspecific due to incomplete distension and could be also be related to  surrounding inflammatory changes. Electronically Signed   By: Richarda Overlie M.D.   On: 05/04/2022 14:19      Scheduled Meds:  amLODipine  10 mg Oral Daily   chlordiazePOXIDE  25 mg Oral TID   Chlorhexidine Gluconate Cloth  6 each Topical Daily   cloNIDine  0.1 mg Oral BID   docusate sodium  100 mg Oral BID   folic acid  1 mg Oral Daily   hydrALAZINE  25 mg Oral Q8H   metoCLOPramide (REGLAN) injection  10 mg Intravenous Q6H   multivitamin with minerals  1 tablet Oral Daily   nicotine  21 mg Transdermal Daily   pantoprazole  40 mg Oral Daily   thiamine  100 mg Oral Daily   Or   thiamine  100 mg Intravenous Daily   Continuous Infusions:  ciprofloxacin 400 mg (05/05/22 0927)   metronidazole 500 mg (05/05/22 0923)      LOS: 3 days     Noralee Stain, DO [facility] 05/05/2022, 12:15 PM   Available via Epic secure chat 7am-7pm After these hours, please refer to coverage provider listed on [email or web address]

## 2022-05-05 NOTE — Progress Notes (Signed)
Mobility Specialist - Progress Note   05/05/22 1239  Mobility  HOB Elevated/Bed Position Self regulated  Activity Ambulated with assistance in hallway  Range of Motion/Exercises Active  Level of Assistance Standby assist, set-up cues, supervision of patient - no hands on  Assistive Device Front wheel walker  Distance Ambulated (ft) 125 ft  Activity Response Tolerated well  Transport method Ambulatory  $Mobility charge 1 Mobility   Pt received in bed and agreeable to mobility. C/o general pain they rated 10/10, but felt like ambulating may help with the pain.  Pt to bed after session with all needs met.   [facility]

## 2022-05-05 NOTE — Progress Notes (Signed)
Subjective: CC: R mid to lower abdominal pain persists but is much improved. Ate half of a meatball sub from home yesterday without increased pain or n/v. Passing flatus. Liquid bm yesterday. Tmax to 100.4 overnight. WBC wnl yesterday. No cbc done today.  Denies prior hx of colitis, enteritis or other similar symptoms. No prior colonoscopy. She has family hx of maternal grandmother, mother and brother with Crohn's. Reports she has been having some vaginal discharge and feels like she is passing "clots". Reports she had just finished her menstrual cycle earlier this month (~2 weeks ago). Reports she has one female sexual partner but is not sure how many partners he has.   Objective: Vital signs in last 24 hours: Temp:  [98.1 F (36.7 C)-100.4 F (38 C)] 100.4 F (38 C) (09/12 0343) Pulse Rate:  [89-93] 89 (09/12 0343) Resp:  [16-20] 18 (09/12 0343) BP: (114-127)/(73-79) 114/73 (09/12 0343) SpO2:  [94 %-97 %] 94 % (09/12 0343) Last BM Date : 05/04/22  Intake/Output from previous day: 09/11 0701 - 09/12 0700 In: 540 [P.O.:240; IV Piggyback:300] Out: -  Intake/Output this shift: No intake/output data recorded.  PE: Gen:  Alert, NAD, pleasant Card:  Reg Pulm:  Rate and effort normal Abd: Soft, ND, some R mid to lower abdominal ttp without rigidty or guarding, +BS Psych: A&Ox3   Lab Results:  Recent Labs    05/04/22 0948  WBC 7.7  HGB 11.4*  HCT 35.3*  PLT 178   BMET Recent Labs    05/04/22 0456 05/05/22 0505  NA 136 134*  K 3.4* 3.6  CL 101 104  CO2 24 23  GLUCOSE 135* 133*  BUN 18 23*  CREATININE 0.79 0.85  CALCIUM 8.9 8.6*   PT/INR No results for input(s): "LABPROT", "INR" in the last 72 hours. CMP     Component Value Date/Time   NA 134 (L) 05/05/2022 0505   K 3.6 05/05/2022 0505   CL 104 05/05/2022 0505   CO2 23 05/05/2022 0505   GLUCOSE 133 (H) 05/05/2022 0505   BUN 23 (H) 05/05/2022 0505   CREATININE 0.85 05/05/2022 0505   CREATININE 0.69  09/23/2016 1442   CALCIUM 8.6 (L) 05/05/2022 0505   PROT 7.0 05/02/2022 0306   ALBUMIN 3.5 05/02/2022 0306   AST 17 05/02/2022 0306   ALT 14 05/02/2022 0306   ALKPHOS 75 05/02/2022 0306   BILITOT 0.7 05/02/2022 0306   GFRNONAA >60 05/05/2022 0505   GFRNONAA >89 09/23/2016 1442   GFRAA >60 05/09/2020 0524   GFRAA >89 09/23/2016 1442   Lipase     Component Value Date/Time   LIPASE 39 05/01/2022 1025    Studies/Results: CT Angio Abd/Pel w/ and/or w/o  Result Date: 05/04/2022 CLINICAL DATA:  Mid and lower abdominal pain. Evaluate for acute mesenteric ischemia. EXAM: CTA ABDOMEN AND PELVIS WITHOUT AND WITH CONTRAST TECHNIQUE: Multidetector CT imaging of the abdomen and pelvis was performed using the standard protocol during bolus administration of intravenous contrast. Multiplanar reconstructed images and MIPs were obtained and reviewed to evaluate the vascular anatomy. RADIATION DOSE REDUCTION: This exam was performed according to the departmental dose-optimization program which includes automated exposure control, adjustment of the mA and/or kV according to patient size and/or use of iterative reconstruction technique. CONTRAST:  OMNIPAQUE IOHEXOL 350 MG/ML SOLN COMPARISON:  CT abdomen and pelvis  05/01/2022 and 08/04/2018 FINDINGS: VASCULAR Aorta: Normal caliber aorta without aneurysm, dissection, vasculitis or significant stenosis. Celiac: Patent without evidence of aneurysm,  dissection, vasculitis or significant stenosis. Variant anatomy with only 2 main branches coming off the celiac trunk. The 2 main branches are the left gastric artery and the splenic artery. Common hepatic artery is coming off the left gastric artery in the expected location of an accessory left gastric artery. There is a small gastroduodenal artery. SMA: Patent without evidence of aneurysm, dissection, vasculitis or significant stenosis. Renals: Both renal arteries are patent without evidence of aneurysm, dissection,  vasculitis, fibromuscular dysplasia or significant stenosis. IMA: Patent without evidence of aneurysm, dissection, vasculitis or significant stenosis. Inflow: Patent without evidence of aneurysm, dissection, vasculitis or significant stenosis. Proximal Outflow: Bilateral common femoral and visualized portions of the superficial and profunda femoral arteries are patent without evidence of aneurysm, dissection, vasculitis or significant stenosis. Veins: Portal veins and main mesenteric veins are patent without filling defects. IVC and renal veins are patent. No gross abnormality to the iliac veins. Review of the MIP images confirms the above findings. NON-VASCULAR Lower chest: Mild dependent changes in the posterior right lower lobe. Otherwise, the lung bases are clear. Hepatobiliary: 8 mm hypodensity in the anterior liver is probably an incidental finding. Gallbladder is decompressed. No biliary dilatation. Pancreas: Unremarkable. No pancreatic ductal dilatation or surrounding inflammatory changes. Spleen: Normal in size without focal abnormality. Adrenals/Urinary Tract: Normal adrenal glands. Normal appearance of both kidneys hydronephrosis. 6 mm hypodensity in the right kidney lower pole on sequence 4, image 114 is too small to definitively characterize but likely represents a cyst. This structure does not require dedicated follow-up. No suspicious renal lesions. Mild wall thickening in the urinary bladder. Stomach/Bowel: Normal appearance of the stomach. Question a small hiatal hernia. New fluid and inflammatory changes in the lower abdomen and in the pelvis. Inflammatory changes are centered around uterus and adnexal structures and near loops of small bowel in the lower abdomen. Mildly dilated loops of small bowel in the lower abdomen, largest measuring up to 3.3 cm. Inflammatory changes are also the near the sigmoid colon. Appendix is not confidently identified. Lymphatic: Mildly prominent retroperitoneal lymph  nodes. Index lymph node between the aorta and IVC on sequence 11 image 100 measures 1.2 cm in the short axis and this lymph node measured approximately 0.7 cm on 08/04/2018. No significant lymph node enlargement in the pelvis. Reproductive: Fluid and stranding throughout the pelvis around the uterus and adnexal structures. No discrete adnexal lesion. Other: There is extensive stranding with a small amount of fluid throughout the lower abdomen and pelvis. Evidence for developing fluid collections in the pelvis. A collection along the left side of the pelvis on sequence 11 image 194 measures 4.8 x 2.7 cm. Evidence for small complex fluid collections in the pre rectal region on image 194. There is additional complex fluid between the uterus and right adnexal structures on image 172. Musculoskeletal: No acute bone abnormality. IMPRESSION: VASCULAR 1. Arterial structures are widely patent without significant atherosclerotic disease. No evidence for arterial stenosis. 2. Variant celiac artery anatomy as described. NON-VASCULAR 1. 1. New fluid and inflammatory changes in the lower abdomen and pelvis. These inflammatory changes are near the uterus, adnexa and loops of bowel. Inflammation source is unclear. Differential diagnosis includes enteritis, colitis and possibly pelvic inflammatory disease. Dilatation of some small bowel loops suggests that small bowel could be the inflammatory source. 2. New small complex fluid collections in the pelvis. Early or developing small abscess collections cannot be excluded. Recommend short-term follow-up CT to evaluate these fluid collections. 3. Slightly enlarged retroperitoneal lymph nodes  as described. Findings are nonspecific but could be reactive. Recommend attention to these on follow-up imaging. 4. Mild wall thickening in the urinary bladder is nonspecific due to incomplete distension and could be also be related to surrounding inflammatory changes. Electronically Signed   By:  Richarda Overlie M.D.   On: 05/04/2022 14:19    Anti-infectives: Anti-infectives (From admission, onward)    Start     Dose/Rate Route Frequency Ordered Stop   05/04/22 2200  metroNIDAZOLE (FLAGYL) IVPB 500 mg        500 mg 100 mL/hr over 60 Minutes Intravenous Every 12 hours 05/04/22 1434     05/04/22 1530  metroNIDAZOLE (FLAGYL) IVPB 500 mg        500 mg 100 mL/hr over 60 Minutes Intravenous  Once 05/04/22 1434 05/04/22 1645   05/04/22 1530  ciprofloxacin (CIPRO) IVPB 400 mg        400 mg 200 mL/hr over 60 Minutes Intravenous 2 times daily 05/04/22 1440     05/04/22 1515  metroNIDAZOLE (FLAGYL) IVPB 500 mg  Status:  Discontinued        500 mg 100 mL/hr over 60 Minutes Intravenous Every 12 hours 05/04/22 1428 05/04/22 1434        Assessment/Plan RLQ abdominal pain Fever Pelvic abscesses  36 year old female who presents with 1 week of progressively worsening right lower quadrant abdominal pain, fever, vomiting.  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  CTA abdomen performed 9/11 shows development of new fluid and inflammatory changes in the lower abdomen and pelvis - No indication for emergency surgery - Agree with IV abx - Nothing drainable at this time per IR. Monitor to see if she will need a repeat scan in the coming days to  - Constellation of symptoms, her significant family history of Crohn's and CT findings are most suspicious for significant Crohn's disease. Would recommend GI consult - Patient reports vaginal d/c. Consider GYN consultation for formal pelvic exam and STD testing.  - [facility] will follow   FEN - Would leave on CLD for now VTE - SCD's, ok for DVT ppx from surgical perspective ID - cipro/flagyl (severe PCN allergy).  Admit - TRH service   HTN urgency Alcohol dependence Polysubstance abuse    LOS: 3 days    Jacinto Halim , [facility] 05/05/2022, 10:03 AM Please see Amion  for pager number during day hours 7:00am-4:30pm

## 2022-05-05 NOTE — Consult Note (Signed)
Referring Provider: [facility] Primary Care Physician:  Patient, No Pcp Per Primary Gastroenterologist:  Unassigned  Reason for Consultation:  abdominal pain  HPI: [name] is a 36 y.o. female 36 y.o. female with medical history significant of anxiety, liver cirrhosis, cocaine abuse, hypertension, unspecified renal disorder, history of other nonhemorrhagic CVA, ventral hernia, constipation who is presented to the emergency department 9/8 due to abdominal pain found to be in hypertensive emergency. Her blood pressure has since normalized but she has continued to have severe lower abdominal pain.   Today she reports for the last 6 days she has had increasing bilateral lower abdominal pain.  Notes pain is worse with movement.  Pain is relieved after bowel movement for short amount of time.  She denies history of chronic diarrhea, but notes since she has been admitted she has had 3 episodes of diarrhea.  Has not previously noted blood in her stool but recently she has seen some blood when going to the bathroom but unsure if it is from vaginal or rectal source given her recent menstrual cycle.  Denies history of weight loss.  Patient is a heavy drinker.  She will drink at least 2-40 ounce beers daily as well as occasional liquor.  She smokes 1 pack of cigarettes daily for the last 20 years.  Prior to this admission she notes for the last 3 years she will occasionally have right lower quadrant pain that will come and go and resolves after a few days.  Denies diarrhea with these episodes.   Reports significant family history of Crohn's disease in her mother, father, grandparents (unable to specify which ones), brother (deceased) and her daughter (not had contact with since she was 2).  She has never seen a GI clinic.  Denies previous colonoscopy, EGD.  Notes she takes Goody powders at least once daily.   Past Medical History:  Diagnosis Date   Anxiety    Cirrhosis of liver (HCC)    Cocaine use  08/04/2018   History of cocaine use 08/04/2018   Hypertension    Polysubstance abuse (HCC)    Renal disorder    Kidney Infection     Past Surgical History:  Procedure Laterality Date   ARTERY REPAIR     CESAREAN SECTION     x 5   MOUTH SURGERY     TEAR DUCT PROBING     TUBAL LIGATION     VENTRAL HERNIA REPAIR N/A 10/03/2016   Procedure: OPEN INCARCERATED HERNIA REPAIR VENTRAL ADULT;  Surgeon: Axel Filler, MD;  Location: [facility] OR;  Service: General;  Laterality: N/A;    Prior to Admission medications   Medication Sig Start Date End Date Taking? Authorizing Provider  acetaminophen (TYLENOL) 325 MG tablet Take 325-650 mg by mouth every 6 (six) hours as needed for mild pain or headache.   Yes [provider]  cloNIDine (CATAPRES) 0.1 MG tablet Take 1 tablet (0.1 mg total) by mouth 2 (two) times daily. 05/01/17  Yes Upstill, Melvenia Beam, PA-C  ibuprofen (ADVIL) 200 MG tablet Take 200-400 mg by mouth every 6 (six) hours as needed for headache or mild pain.   Yes [provider]  chlordiazePOXIDE (LIBRIUM) 25 MG capsule Take 1 capsule (25 mg total) by mouth 3 (three) times daily as needed for withdrawal. Patient not taking: Reported on 05/01/2022 08/04/18   Felicie Morn, NP  omeprazole (PRILOSEC) 40 MG capsule Take 1 capsule (40 mg total) by mouth daily. Patient not taking: Reported on 05/01/2022 09/03/17  Ward, Layla Maw, DO  propranolol (INDERAL) 40 MG tablet Take 1 tablet (40 mg total) by mouth 2 (two) times daily. Patient not taking: Reported on 05/01/2022 05/01/17   Elpidio Anis, PA-C    Scheduled Meds:  amLODipine  10 mg Oral Daily   chlordiazePOXIDE  25 mg Oral TID   Chlorhexidine Gluconate Cloth  6 each Topical Daily   cloNIDine  0.1 mg Oral BID   docusate sodium  100 mg Oral BID   folic acid  1 mg Oral Daily   hydrALAZINE  25 mg Oral Q8H   metoCLOPramide (REGLAN) injection  10 mg Intravenous Q6H   multivitamin with minerals  1 tablet Oral Daily   nicotine  21 mg  Transdermal Daily   pantoprazole  40 mg Oral Daily   thiamine  100 mg Oral Daily   Or   thiamine  100 mg Intravenous Daily   Continuous Infusions:  ciprofloxacin 400 mg (05/05/22 0927)   metronidazole 500 mg (05/05/22 0923)   PRN Meds:.acetaminophen, HYDROmorphone (DILAUDID) injection, hydrOXYzine, ondansetron **OR** ondansetron (ZOFRAN) IV, mouth rinse, polyethylene glycol, traMADol  Allergies as of 05/01/2022 - Review Complete 05/01/2022  Allergen Reaction Noted   Ibuprofen Anaphylaxis, Swelling, and Other (See Comments) 08/07/2015   Latex Shortness Of Breath 05/01/2022   Peanut-containing drug products Anaphylaxis 02/23/2017   Penicillins Anaphylaxis and Hives 09/28/2012   Acetaminophen Other (See Comments) 01/04/2013   Codeine Hives and Itching 09/28/2012   Lisinopril Swelling 07/30/2013   Ativan [lorazepam] Other (See Comments) 01/27/2016    Family History  Problem Relation Age of Onset   CAD Other    Diabetes Other    Hypertension Other    Cancer Other    Thyroid disease Other     Social History   Socioeconomic History   Marital status: Single    Spouse name: Not on file   Number of children: Not on file   Years of education: Not on file   Highest education level: Not on file  Occupational History   Not on file  Tobacco Use   Smoking status: Every Day    Packs/day: 0.50    Types: Cigarettes   Smokeless tobacco: Never  Vaping Use   Vaping Use: Never used  Substance and Sexual Activity   Alcohol use: Yes    Comment: daily   Drug use: Not Currently    Types: Cocaine   Sexual activity: Not on file  Other Topics Concern   Not on file  Social History Narrative   Not on file   Social Determinants of Health   Financial Resource Strain: Not on file  Food Insecurity: Not on file  Transportation Needs: Not on file  Physical Activity: Not on file  Stress: Not on file  Social Connections: Not on file  Intimate Partner Violence: Not on file    Review of  Systems: All negative except as stated above in HPI.  Physical Exam:Physical Exam Constitutional:      General: She is not in acute distress.    Appearance: Normal appearance. She is obese.  HENT:     Head: Normocephalic and atraumatic.     Right Ear: External ear normal.     Left Ear: External ear normal.     Nose: Nose normal.     Mouth/Throat:     Mouth: Mucous membranes are moist.  Eyes:     General: No scleral icterus.    Pupils: Pupils are equal, round, and reactive to light.  Cardiovascular:  Rate and Rhythm: Normal rate and regular rhythm.     Pulses: Normal pulses.     Heart sounds: Normal heart sounds.  Pulmonary:     Effort: Pulmonary effort is normal.     Breath sounds: Normal breath sounds.  Abdominal:     General: Abdomen is flat. Bowel sounds are normal. There is distension.     Palpations: Abdomen is soft. There is no mass.     Tenderness: There is abdominal tenderness (diffuse). There is no guarding or rebound.     Hernia: No hernia is present.  Musculoskeletal:        General: No swelling. Normal range of motion.     Cervical back: Normal range of motion and neck supple.  Skin:    General: Skin is warm and dry.     Coloration: Skin is not pale.  Neurological:     General: No focal deficit present.     Mental Status: She is alert and oriented to person, place, and time. Mental status is at baseline.  Psychiatric:        Mood and Affect: Mood normal.        Behavior: Behavior normal.     Vital signs: Vitals:   05/04/22 1948 05/05/22 0343  BP: 118/79 114/73  Pulse: 93 89  Resp: 20 18  Temp:  (!) 100.4 F (38 C)  SpO2: 95% 94%   Last BM Date : 05/04/22    GI:  Lab Results: Recent Labs    05/04/22 0948  WBC 7.7  HGB 11.4*  HCT 35.3*  PLT 178   BMET Recent Labs    05/03/22 0530 05/04/22 0456 05/05/22 0505  NA 133* 136 134*  K 3.7 3.4* 3.6  CL 102 101 104  CO2 24 24 23   GLUCOSE 114* 135* 133*  BUN 12 18 23*  CREATININE 0.49  0.79 0.85  CALCIUM 8.3* 8.9 8.6*   LFT No results for input(s): "PROT", "ALBUMIN", "AST", "ALT", "ALKPHOS", "BILITOT", "BILIDIR", "IBILI" in the last 72 hours. PT/INR No results for input(s): "LABPROT", "INR" in the last 72 hours.   Studies/Results: CT Angio Abd/Pel w/ and/or w/o  Result Date: 05/04/2022 CLINICAL DATA:  Mid and lower abdominal pain. Evaluate for acute mesenteric ischemia. EXAM: CTA ABDOMEN AND PELVIS WITHOUT AND WITH CONTRAST TECHNIQUE: Multidetector CT imaging of the abdomen and pelvis was performed using the standard protocol during bolus administration of intravenous contrast. Multiplanar reconstructed images and MIPs were obtained and reviewed to evaluate the vascular anatomy. RADIATION DOSE REDUCTION: This exam was performed according to the departmental dose-optimization program which includes automated exposure control, adjustment of the mA and/or kV according to patient size and/or use of iterative reconstruction technique. CONTRAST:  07/04/2022 OMNIPAQUE IOHEXOL 350 MG/ML SOLN COMPARISON:  CT abdomen and pelvis  05/01/2022 and 08/04/2018 FINDINGS: VASCULAR Aorta: Normal caliber aorta without aneurysm, dissection, vasculitis or significant stenosis. Celiac: Patent without evidence of aneurysm, dissection, vasculitis or significant stenosis. Variant anatomy with only 2 main branches coming off the celiac trunk. The 2 main branches are the left gastric artery and the splenic artery. Common hepatic artery is coming off the left gastric artery in the expected location of an accessory left gastric artery. There is a small gastroduodenal artery. SMA: Patent without evidence of aneurysm, dissection, vasculitis or significant stenosis. Renals: Both renal arteries are patent without evidence of aneurysm, dissection, vasculitis, fibromuscular dysplasia or significant stenosis. IMA: Patent without evidence of aneurysm, dissection, vasculitis or significant stenosis. Inflow: Patent without  evidence of aneurysm, dissection, vasculitis or significant stenosis. Proximal Outflow: Bilateral common femoral and visualized portions of the superficial and profunda femoral arteries are patent without evidence of aneurysm, dissection, vasculitis or significant stenosis. Veins: Portal veins and main mesenteric veins are patent without filling defects. IVC and renal veins are patent. No gross abnormality to the iliac veins. Review of the MIP images confirms the above findings. NON-VASCULAR Lower chest: Mild dependent changes in the posterior right lower lobe. Otherwise, the lung bases are clear. Hepatobiliary: 8 mm hypodensity in the anterior liver is probably an incidental finding. Gallbladder is decompressed. No biliary dilatation. Pancreas: Unremarkable. No pancreatic ductal dilatation or surrounding inflammatory changes. Spleen: Normal in size without focal abnormality. Adrenals/Urinary Tract: Normal adrenal glands. Normal appearance of both kidneys hydronephrosis. 6 mm hypodensity in the right kidney lower pole on sequence 4, image 114 is too small to definitively characterize but likely represents a cyst. This structure does not require dedicated follow-up. No suspicious renal lesions. Mild wall thickening in the urinary bladder. Stomach/Bowel: Normal appearance of the stomach. Question a small hiatal hernia. New fluid and inflammatory changes in the lower abdomen and in the pelvis. Inflammatory changes are centered around uterus and adnexal structures and near loops of small bowel in the lower abdomen. Mildly dilated loops of small bowel in the lower abdomen, largest measuring up to 3.3 cm. Inflammatory changes are also the near the sigmoid colon. Appendix is not confidently identified. Lymphatic: Mildly prominent retroperitoneal lymph nodes. Index lymph node between the aorta and IVC on sequence 11 image 100 measures 1.2 cm in the short axis and this lymph node measured approximately 0.7 cm on 08/04/2018.  No significant lymph node enlargement in the pelvis. Reproductive: Fluid and stranding throughout the pelvis around the uterus and adnexal structures. No discrete adnexal lesion. Other: There is extensive stranding with a small amount of fluid throughout the lower abdomen and pelvis. Evidence for developing fluid collections in the pelvis. A collection along the left side of the pelvis on sequence 11 image 194 measures 4.8 x 2.7 cm. Evidence for small complex fluid collections in the pre rectal region on image 194. There is additional complex fluid between the uterus and right adnexal structures on image 172. Musculoskeletal: No acute bone abnormality. IMPRESSION: VASCULAR 1. Arterial structures are widely patent without significant atherosclerotic disease. No evidence for arterial stenosis. 2. Variant celiac artery anatomy as described. NON-VASCULAR 1. 1. New fluid and inflammatory changes in the lower abdomen and pelvis. These inflammatory changes are near the uterus, adnexa and loops of bowel. Inflammation source is unclear. Differential diagnosis includes enteritis, colitis and possibly pelvic inflammatory disease. Dilatation of some small bowel loops suggests that small bowel could be the inflammatory source. 2. New small complex fluid collections in the pelvis. Early or developing small abscess collections cannot be excluded. Recommend short-term follow-up CT to evaluate these fluid collections. 3. Slightly enlarged retroperitoneal lymph nodes as described. Findings are nonspecific but could be reactive. Recommend attention to these on follow-up imaging. 4. Mild wall thickening in the urinary bladder is nonspecific due to incomplete distension and could be also be related to surrounding inflammatory changes. Electronically Signed   By: Richarda Overlie M.D.   On: 05/04/2022 14:19    Impression: Abdominal pain Reported family history of Crohn's disease Pelvic abscess  With intermittent lower abdominal pain for  several years now presenting to the hospital with severe constant bilateral lower abdominal pain.  She has been intermittently febrile during admission temp  of 103.1 on 05/03/2022, and today temp of 100.4 overnight.  She is currently on metronidazole 500 mg twice daily and Cipro 400 mg twice daily for intra-abdominal infection.  WBC is not elevated today.  CT angiogram with findings concerning for pelvic abscess.  Evaluation by interventional radiology with no recommended areas to drain at this time.  CT angio also showed possible source of inflammation within the small bowel.  Patient with significant reported family history of Crohn's disease.  Findings concerning for possible Crohn's disease.  CT angio abdomen pelvis with and without contrast 05/04/2022 1. New fluid and inflammatory changes in the lower abdomen and pelvis. These inflammatory changes are near the uterus, adnexa and loops of bowel. Inflammation source is unclear. Differential diagnosis includes enteritis, colitis and possibly pelvic inflammatory disease. Dilatation of some small bowel loops suggests that small bowel could be the inflammatory source. 2. New small complex fluid collections in the pelvis. Early or developing small abscess collections cannot be excluded. 3. Slightly enlarged retroperitoneal lymph nodes as described. Findings are nonspecific but could be reactive. 4. Mild wall thickening in the urinary bladder is nonspecific due to incomplete distension and could be also be related to surrounding inflammatory changes.   Plan: Continue pain control and antiemetics as needed. Continue IV antibiotics. Consider repeat CT in the next 1 to 2 days for further evaluation of possible pelvic abscess. Continue clear liquid diet as tolerated. Recommended alcohol, smoking, and Goody powder use cessation Patient may need evaluation with outpatient colonoscopy for further evaluation of Crohn's disease once infection has been managed. [facility] will follow.  LOS: 3 days   Emmit Alexanders  PA-C 05/05/2022, 11:02 AM  Contact #  [phone]

## 2022-05-06 ENCOUNTER — Encounter (HOSPITAL_COMMUNITY): Payer: Self-pay | Admitting: Internal Medicine

## 2022-05-06 ENCOUNTER — Inpatient Hospital Stay (HOSPITAL_COMMUNITY): Payer: Self-pay

## 2022-05-06 DIAGNOSIS — I6389 Other cerebral infarction: Secondary | ICD-10-CM

## 2022-05-06 LAB — CBC
HCT: 35.9 % — ABNORMAL LOW (ref 36.0–46.0)
Hemoglobin: 11.3 g/dL — ABNORMAL LOW (ref 12.0–15.0)
MCH: 30.7 pg (ref 26.0–34.0)
MCHC: 31.5 g/dL (ref 30.0–36.0)
MCV: 97.6 fL (ref 80.0–100.0)
Platelets: 209 10*3/uL (ref 150–400)
RBC: 3.68 MIL/uL — ABNORMAL LOW (ref 3.87–5.11)
RDW: 15.5 % (ref 11.5–15.5)
WBC: 6.9 10*3/uL (ref 4.0–10.5)
nRBC: 0 % (ref 0.0–0.2)

## 2022-05-06 LAB — ECHOCARDIOGRAM COMPLETE
AR max vel: 2.4 cm2
AV Peak grad: 10.4 mmHg
Ao pk vel: 1.61 m/s
Area-P 1/2: 3.06 cm2
Height: 60 in
S' Lateral: 2.9 cm
Weight: 2539.7 oz

## 2022-05-06 LAB — BASIC METABOLIC PANEL
Anion gap: 9 (ref 5–15)
BUN: 15 mg/dL (ref 6–20)
CO2: 21 mmol/L — ABNORMAL LOW (ref 22–32)
Calcium: 8.8 mg/dL — ABNORMAL LOW (ref 8.9–10.3)
Chloride: 106 mmol/L (ref 98–111)
Creatinine, Ser: 0.69 mg/dL (ref 0.44–1.00)
GFR, Estimated: >60 mL/min (ref >60)
Glucose, Bld: 98 mg/dL (ref 70–99)
Potassium: 4.1 mmol/L (ref 3.5–5.1)
Sodium: 136 mmol/L (ref 135–145)

## 2022-05-06 MED ORDER — AMLODIPINE BESYLATE 5 MG PO TABS: 5.0000 mg | ORAL_TABLET | Freq: Every day | ORAL | Status: DC

## 2022-05-06 MED ORDER — ASPIRIN 81 MG PO TBEC
81.0000 mg | DELAYED_RELEASE_TABLET | Freq: Every day | ORAL | Status: DC
  Administered 2022-05-06 – 2022-05-11 (×6): 81 mg via ORAL
  Filled 2022-05-06 (×6): qty 1

## 2022-05-06 MED ORDER — STROKE: EARLY STAGES OF RECOVERY BOOK
Freq: Once | Status: AC
  Filled 2022-05-06: qty 1

## 2022-05-06 MED ORDER — LABETALOL HCL 5 MG/ML IV SOLN
10.0000 mg | INTRAVENOUS | Status: DC | PRN
  Filled 2022-05-06: qty 4

## 2022-05-06 MED ORDER — IOHEXOL 350 MG/ML SOLN
75.0000 mL | Freq: Once | INTRAVENOUS | Status: AC | PRN
  Administered 2022-05-06: 75 mL via INTRAVENOUS

## 2022-05-06 MED ORDER — PANTOPRAZOLE SODIUM 40 MG PO TBEC
40.0000 mg | DELAYED_RELEASE_TABLET | Freq: Two times a day (BID) | ORAL | Status: DC
  Administered 2022-05-06 – 2022-05-11 (×11): 40 mg via ORAL
  Filled 2022-05-06 (×11): qty 1

## 2022-05-06 NOTE — Progress Notes (Addendum)
Subjective: CC: R mid to lower abdominal pain persists but is much improved. Tolerating diet, without n/v. Passing flatus. Liquid bm yesterday. Tmax to 100.4 overnight. WBC wnl yesterday. No cbc done today.  Denies prior hx of colitis, enteritis or other similar symptoms. No prior colonoscopy. She has family hx of maternal grandmother, mother and brother with Crohn's. Reports she has been having some vaginal discharge and feels like she is passing "clots". Reports she had just finished her menstrual cycle earlier this month (~2 weeks ago).  Objective: Vital signs in last 24 hours: Temp:  [97.8 F (36.6 C)-99.1 F (37.3 C)] 97.8 F (36.6 C) (09/13 0505) Pulse Rate:  [78-86] 78 (09/13 0505) Resp:  [16-22] 20 (09/13 0505) BP: (120-142)/(68-80) 128/76 (09/13 0505) SpO2:  [94 %-99 %] 97 % (09/13 0505) FiO2 (%):  [21 %] 21 % (09/12 2034) Last BM Date : 05/05/22  Intake/Output from previous day: 09/12 0701 - 09/13 0700 In: 1503.2 [P.O.:601; IV Piggyback:902.2] Out: -  Intake/Output this shift: No intake/output data recorded.  PE: Gen:  Alert, NAD, pleasant Card:  Reg Pulm:  Rate and effort normal Abd: Soft, ND, minimal tenderness without rebound/guarding Psych: A&Ox3   Lab Results:  Recent Labs    05/04/22 0948 05/06/22 0456  WBC 7.7 6.9  HGB 11.4* 11.3*  HCT 35.3* 35.9*  PLT 178 209   BMET Recent Labs    05/05/22 0505 05/06/22 0456  NA 134* 136  K 3.6 4.1  CL 104 106  CO2 23 21*  GLUCOSE 133* 98  BUN 23* 15  CREATININE 0.85 0.69  CALCIUM 8.6* 8.8*   PT/INR No results for input(s): "LABPROT", "INR" in the last 72 hours. CMP     Component Value Date/Time   NA 136 05/06/2022 0456   K 4.1 05/06/2022 0456   CL 106 05/06/2022 0456   CO2 21 (L) 05/06/2022 0456   GLUCOSE 98 05/06/2022 0456   BUN 15 05/06/2022 0456   CREATININE 0.69 05/06/2022 0456   CREATININE 0.69 09/23/2016 1442   CALCIUM 8.8 (L) 05/06/2022 0456   PROT 7.0 05/02/2022 0306   ALBUMIN  3.5 05/02/2022 0306   AST 17 05/02/2022 0306   ALT 14 05/02/2022 0306   ALKPHOS 75 05/02/2022 0306   BILITOT 0.7 05/02/2022 0306   GFRNONAA >60 05/06/2022 0456   GFRNONAA >89 09/23/2016 1442   GFRAA >60 05/09/2020 0524   GFRAA >89 09/23/2016 1442   Lipase     Component Value Date/Time   LIPASE 39 05/01/2022 1025    Studies/Results: CT Angio Abd/Pel w/ and/or w/o  Result Date: 05/04/2022 CLINICAL DATA:  Mid and lower abdominal pain. Evaluate for acute mesenteric ischemia. EXAM: CTA ABDOMEN AND PELVIS WITHOUT AND WITH CONTRAST TECHNIQUE: Multidetector CT imaging of the abdomen and pelvis was performed using the standard protocol during bolus administration of intravenous contrast. Multiplanar reconstructed images and MIPs were obtained and reviewed to evaluate the vascular anatomy. RADIATION DOSE REDUCTION: This exam was performed according to the departmental dose-optimization program which includes automated exposure control, adjustment of the mA and/or kV according to patient size and/or use of iterative reconstruction technique. CONTRAST:  OMNIPAQUE IOHEXOL 350 MG/ML SOLN COMPARISON:  CT abdomen and pelvis  05/01/2022 and 08/04/2018 FINDINGS: VASCULAR Aorta: Normal caliber aorta without aneurysm, dissection, vasculitis or significant stenosis. Celiac: Patent without evidence of aneurysm, dissection, vasculitis or significant stenosis. Variant anatomy with only 2 main branches coming off the celiac trunk. The 2 main branches are  the left gastric artery and the splenic artery. Common hepatic artery is coming off the left gastric artery in the expected location of an accessory left gastric artery. There is a small gastroduodenal artery. SMA: Patent without evidence of aneurysm, dissection, vasculitis or significant stenosis. Renals: Both renal arteries are patent without evidence of aneurysm, dissection, vasculitis, fibromuscular dysplasia or significant stenosis. IMA: Patent without evidence  of aneurysm, dissection, vasculitis or significant stenosis. Inflow: Patent without evidence of aneurysm, dissection, vasculitis or significant stenosis. Proximal Outflow: Bilateral common femoral and visualized portions of the superficial and profunda femoral arteries are patent without evidence of aneurysm, dissection, vasculitis or significant stenosis. Veins: Portal veins and main mesenteric veins are patent without filling defects. IVC and renal veins are patent. No gross abnormality to the iliac veins. Review of the MIP images confirms the above findings. NON-VASCULAR Lower chest: Mild dependent changes in the posterior right lower lobe. Otherwise, the lung bases are clear. Hepatobiliary: 8 mm hypodensity in the anterior liver is probably an incidental finding. Gallbladder is decompressed. No biliary dilatation. Pancreas: Unremarkable. No pancreatic ductal dilatation or surrounding inflammatory changes. Spleen: Normal in size without focal abnormality. Adrenals/Urinary Tract: Normal adrenal glands. Normal appearance of both kidneys hydronephrosis. 6 mm hypodensity in the right kidney lower pole on sequence 4, image 114 is too small to definitively characterize but likely represents a cyst. This structure does not require dedicated follow-up. No suspicious renal lesions. Mild wall thickening in the urinary bladder. Stomach/Bowel: Normal appearance of the stomach. Question a small hiatal hernia. New fluid and inflammatory changes in the lower abdomen and in the pelvis. Inflammatory changes are centered around uterus and adnexal structures and near loops of small bowel in the lower abdomen. Mildly dilated loops of small bowel in the lower abdomen, largest measuring up to 3.3 cm. Inflammatory changes are also the near the sigmoid colon. Appendix is not confidently identified. Lymphatic: Mildly prominent retroperitoneal lymph nodes. Index lymph node between the aorta and IVC on sequence 11 image 100 measures 1.2 cm  in the short axis and this lymph node measured approximately 0.7 cm on 08/04/2018. No significant lymph node enlargement in the pelvis. Reproductive: Fluid and stranding throughout the pelvis around the uterus and adnexal structures. No discrete adnexal lesion. Other: There is extensive stranding with a small amount of fluid throughout the lower abdomen and pelvis. Evidence for developing fluid collections in the pelvis. A collection along the left side of the pelvis on sequence 11 image 194 measures 4.8 x 2.7 cm. Evidence for small complex fluid collections in the pre rectal region on image 194. There is additional complex fluid between the uterus and right adnexal structures on image 172. Musculoskeletal: No acute bone abnormality. IMPRESSION: VASCULAR 1. Arterial structures are widely patent without significant atherosclerotic disease. No evidence for arterial stenosis. 2. Variant celiac artery anatomy as described. NON-VASCULAR 1. 1. New fluid and inflammatory changes in the lower abdomen and pelvis. These inflammatory changes are near the uterus, adnexa and loops of bowel. Inflammation source is unclear. Differential diagnosis includes enteritis, colitis and possibly pelvic inflammatory disease. Dilatation of some small bowel loops suggests that small bowel could be the inflammatory source. 2. New small complex fluid collections in the pelvis. Early or developing small abscess collections cannot be excluded. Recommend short-term follow-up CT to evaluate these fluid collections. 3. Slightly enlarged retroperitoneal lymph nodes as described. Findings are nonspecific but could be reactive. Recommend attention to these on follow-up imaging. 4. Mild wall thickening in the  urinary bladder is nonspecific due to incomplete distension and could be also be related to surrounding inflammatory changes. Electronically Signed   By: Richarda Overlie M.D.   On: 05/04/2022 14:19    Anti-infectives: Anti-infectives (From admission,  onward)    Start     Dose/Rate Route Frequency Ordered Stop   05/05/22 1315  doxycycline (VIBRA-TABS) tablet 100 mg        100 mg Oral Every 12 hours 05/05/22 1223     05/04/22 2200  metroNIDAZOLE (FLAGYL) IVPB 500 mg        500 mg 100 mL/hr over 60 Minutes Intravenous Every 12 hours 05/04/22 1434     05/04/22 1530  metroNIDAZOLE (FLAGYL) IVPB 500 mg        500 mg 100 mL/hr over 60 Minutes Intravenous  Once 05/04/22 1434 05/04/22 1645   05/04/22 1530  ciprofloxacin (CIPRO) IVPB 400 mg        400 mg 200 mL/hr over 60 Minutes Intravenous 2 times daily 05/04/22 1440     05/04/22 1515  metroNIDAZOLE (FLAGYL) IVPB 500 mg  Status:  Discontinued        500 mg 100 mL/hr over 60 Minutes Intravenous Every 12 hours 05/04/22 1428 05/04/22 1434        Assessment/Plan RLQ abdominal pain Fever Pelvic abscesses  36 year old female who presents with 1 week of progressively worsening right lower quadrant abdominal pain, fever, vomiting.  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  CTA abdomen performed 9/11 shows development of new fluid and inflammatory changes in the lower abdomen and pelvis - No indication for emergency surgery - Agree with IV abx - Nothing drainable at this time per IR. Monitor to see if she will need a repeat scan in the coming days to  - Constellation of symptoms, her significant family history of Crohn's and CT findings are most suspicious for significant Crohn's disease. Would recommend GI consult - Patient reports vaginal d/c. We recommend GYN consultation for formal pelvic exam and STD testing, this will help with treatment decision making while inpatient and should not be delayed to outpatient setting. - [facility] will follow   FEN - Advance diet as tolerated VTE - SCD's, ok for DVT ppx from surgical perspective ID - cipro/flagyl (severe PCN allergy).  Admit - TRH service   HTN urgency Alcohol  dependence Polysubstance abuse   I spent a total of 35 minutes in both face-to-face and non-face-to-face activities, excluding procedures performed, for this visit on the date of this encounter.     LOS: 4 days   Check [email or web address] for General Surgery coverage night/weekend/holidays  Page if acute issues. No secure chat available for me given surgeries/clinic/off post call which would lead to a delay in care.  Marin Olp, MD [facility], [facility]

## 2022-05-06 NOTE — Plan of Care (Signed)
  Problem: Education: Goal: Knowledge of General Education information will improve Description Including pain rating scale, medication(s)/side effects and non-pharmacologic comfort measures Outcome: Progressing   Problem: Health Behavior/Discharge Planning: Goal: Ability to manage health-related needs will improve Outcome: Progressing   

## 2022-05-06 NOTE — Progress Notes (Signed)
[facility] Gastroenterology Progress Note  [name] 36 y.o. Mar 25, 1986   Subjective: Patient reports abdominal pain in her lower abdomen similar to pain experienced yesterday.  No improvement overnight.  Notes her legs feel heavy, she had some weakness undergoing evaluation for stroke, CT head without contrast with new hypodensity with plans to further assess with brain MRI.Marland Kitchen  She has been afebrile for the last 24 hours.  ROS : Review of Systems  Gastrointestinal:  Positive for abdominal pain. Negative for blood in stool, constipation, diarrhea, heartburn, melena, nausea and vomiting.  Genitourinary:  Negative for dysuria and urgency.    Objective: Vital signs in last 24 hours: Vitals:   05/06/22 0838 05/06/22 0940  BP: 139/67 135/82  Pulse: 79   Resp: 19   Temp: 98.9 F (37.2 C)   SpO2: 95%     Physical Exam:  General:  Alert, cooperative, no distress, appears stated age, visibly uncomfortable  Head:  Normocephalic, without obvious abnormality, atraumatic  Eyes:  Anicteric sclera, EOM's intact  Lungs:   Clear to auscultation bilaterally, respirations unlabored  Heart:  Regular rate and rhythm, S1, S2 normal  Abdomen:   Soft, generalized abdominal tenderness with guarding, no rebound., bowel sounds active all four quadrants,  no masses,   Extremities: Extremities normal, atraumatic, no  edema  Pulses: 2+ and symmetric    Lab Results: Recent Labs    05/05/22 0505 05/06/22 0456  NA 134* 136  K 3.6 4.1  CL 104 106  CO2 23 21*  GLUCOSE 133* 98  BUN 23* 15  CREATININE 0.85 0.69  CALCIUM 8.6* 8.8*   No results for input(s): "AST", "ALT", "ALKPHOS", "BILITOT", "PROT", "ALBUMIN" in the last 72 hours. Recent Labs    05/04/22 0948 05/06/22 0456  WBC 7.7 6.9  HGB 11.4* 11.3*  HCT 35.3* 35.9*  MCV 93.6 97.6  PLT 178 209   No results for input(s): "LABPROT", "INR" in the last 72 hours.    Assessment Abdominal pain Reported family history of Crohn's  disease Pelvic abscess   With intermittent lower abdominal pain for several years now presenting to the hospital with severe constant bilateral lower abdominal pain.  She has been intermittently febrile during admission temp of 103.1 on 05/03/2022, he has been afebrile for the last 24 hours.  She is currently on metronidazole 500 mg twice daily and Cipro 400 mg twice daily for intra-abdominal infection.  WBC is not elevated today.  CT angiogram with findings concerning for pelvic abscess.  Evaluation by interventional radiology with no recommended areas to drain at this time.  CT angio also showed possible source of inflammation within the small bowel.  Patient with significant reported family history of Crohn's disease.  Findings concerning for possible Crohn's disease.   CT angio abdomen pelvis with and without contrast 05/04/2022 1. New fluid and inflammatory changes in the lower abdomen and pelvis. These inflammatory changes are near the uterus, adnexa and loops of bowel. Inflammation source is unclear. Differential diagnosis includes enteritis, colitis and possibly pelvic inflammatory disease. Dilatation of some small bowel loops suggests that small bowel could be the inflammatory source. 2. New small complex fluid collections in the pelvis. Early or developing small abscess collections cannot be excluded. 3. Slightly enlarged retroperitoneal lymph nodes as described. Findings are nonspecific but could be reactive. 4. Mild wall thickening in the urinary bladder is nonspecific due to incomplete distension and could be also be related to surrounding inflammatory changes.     Plan: Continue pain  control and antiemetics as needed. Continue IV antibiotics. Consider repeat CT in the next 1 to 2 days for further evaluation of possible pelvic abscess. Continue clear liquid diet as tolerated. Recommended alcohol, smoking, and Goody powder use cessation Patient may need evaluation in the future with  colonoscopy for further evaluation of Crohn's disease once infection has been managed. [facility] will follow.  [name] Angela Plant PA-C 05/06/2022, 1:06 PM  Contact #  [phone]

## 2022-05-06 NOTE — Progress Notes (Signed)
PT Cancellation Note  Patient Details Name: DERIN MATTHES MRN: [id number] DOB: April 24, 1986   Cancelled Treatment:    Reason Eval/Treat Not Completed: Patient at procedure or test/unavailable  Received orders in afternoon, pt off floor for testing at 1659.  Will f/u at later date. Anise Salvo, PT Acute Rehab [facility] [phone]  Rayetta Humphrey 05/06/2022, 4:59 PM

## 2022-05-06 NOTE — Plan of Care (Signed)
  Problem: Education: Goal: Knowledge of General Education information will improve Description: Including pain rating scale, medication(s)/side effects and non-pharmacologic comfort measures 05/06/2022 1603 by Val [facility], RN Outcome: Progressing 05/06/2022 1518 by Val [facility], RN Outcome: Progressing   Problem: Health Behavior/Discharge Planning: Goal: Ability to manage health-related needs will improve 05/06/2022 1603 by Val [facility], RN Outcome: Progressing 05/06/2022 1518 by Val [facility], RN Outcome: Progressing

## 2022-05-06 NOTE — Progress Notes (Signed)
Mobility Specialist Cancellation/Refusal Note:   Reason for Cancellation/Refusal: Pt declined mobility at this time.Pt in pain. Will check back as schedule permits.       Maya Eulis Salazar Mobility Specialist  

## 2022-05-06 NOTE — Progress Notes (Addendum)
PROGRESS NOTE  [name] DXA:[id number] DOB: 02-26-1986 DOA: 05/01/2022 PCP: Patient, No Pcp Per   LOS: 4 days   Brief Narrative / Interim history: [name] is a 36 y.o. female with medical history significant of anxiety, liver cirrhosis, cocaine abuse, hypertension, unspecified renal disorder, history of other nonhemorrhagic CVA, ventral hernia, constipation who is coming to the emergency department due to abdominal pain associated with nausea, dry heaving and constipation for the past 3 days.  On presentation to the emergency department, patient was hypertensive with BP up to 189/128.  She was started on nicardipine drip.  CT abdomen pelvis as well as pelvic ultrasound was completed without clear etiology of abdominal pain found.  Due to fevers and continued abdominal pain, CTA abdomen pelvis was done which revealed fluid and inflammatory changes in the lower abdomen and pelvis, new small complex fluid collections in the pelvis.  Patient was started on antibiotics and general surgery consulted.  Significant events: 9/8-admission 9/11-persistent fever, CTA shows new small complex fluid collection in the pelvis, general surgery consulted 9/12-GI consulted due to concern for Crohn's 9/13-MRI of the brain showing multiple areas of restricted diffusion compatible with acute infarcts in the right pons, left putamen, right thalamus, corpus callosum and left frontal white matter.  Significant imaging / results / micro data: CT abdomen pelvis, pelvic ultrasound 9/8-no significant acute findings CTA 9/11-new fluid and inflammatory changes in the lower abdomen and pelvis next to the uterus, loops of bowel without clear source.  This represents enteritis, colitis and possibly PID.  New small complex fluid collection in the pelvis, early versus developing small abscess. Blood cultures 9/10-no growth to date, pending  Subjective / 24h Interval events: No abdominal pain and right-sided  heaviness  Assesement and Plan: Principal Problem:   Hypertensive urgency Active Problems:   Alcohol withdrawal (HCC)   Substance abuse (HCC)   Hepatic steatosis   Abdominal pain   Nausea and vomiting   Cocaine abuse (HCC)   Alcohol dependence (HCC)   Hypokalemia   Principal problem Intra-abdominal infection-she initially presented with abdominal pain, nausea, vomiting, initial work-up with a CT abdomen and pelvis and a pelvic ultrasound was unremarkable.  Due to persistent pain and fever, underwent a repeat CT angiogram which showed fluid and inflammatory changes in the lower abdomen and pelvis, new small complex fluid collections in the pelvis.  General surgery was consulted and she was started on antibiotics.  GI was also consulted due to strong family history of Crohn's disease -GYN consulted, spoke with Dr. Despina Hidden again today, he feels like she needs to complete an antibiotic course at this point  Active problems Acute multiple punctate CVAs-patient was complaining of right-sided heaviness this morning, but able to ambulate in neuro exam was very inconsistent.  An MRI of the brain showed multiple areas of small infarcts involving the right pons, left putamen, right thalamus, corpus callosum and left frontal white matter.  This is probably related to her fluctuation in her blood pressure, when she came in her systolic was in the 200s initially requiring Cardene drip.  Blood pressure yesterday was as low as 110s systolic.  Discussed with neurology, placed on aspirin, allow permissive hypertension, complete the stroke work-up  Hypertensive urgency without organ failure -initially placed on nicardipine, now weaned off.  Currently on amlodipine, clonidine, hydralazine.  Due to CVA allow permissive hypertension.  Stop clonidine, hydralazine and lower amlodipine to 5 mg and hold it for the next 48 hours.  Labetalol IV  PRN for systolic greater than 200  Alcohol dependence -Librium 3 times daily  for withdrawal - wean    Substance abuse -UDS is negative for cocaine, admitted last cocaine use 2 weeks ago. UDS positive for opiate   Tobacco abuse -Nicotine patch    Scheduled Meds:  amLODipine  10 mg Oral Daily   chlordiazePOXIDE  10 mg Oral TID   Chlorhexidine Gluconate Cloth  6 each Topical Daily   cloNIDine  0.1 mg Oral BID   docusate sodium  100 mg Oral BID   doxycycline  100 mg Oral Q12H   folic acid  1 mg Oral Daily   hydrALAZINE  25 mg Oral Q8H   metoCLOPramide (REGLAN) injection  10 mg Intravenous Q6H   multivitamin with minerals  1 tablet Oral Daily   nicotine  21 mg Transdermal Daily   pantoprazole  40 mg Oral Daily   thiamine  100 mg Oral Daily   Or   thiamine  100 mg Intravenous Daily   Continuous Infusions:  ciprofloxacin Stopped (05/05/22 2155)   metronidazole Stopped (05/05/22 2147)   PRN Meds:.acetaminophen, HYDROmorphone (DILAUDID) injection, hydrOXYzine, ondansetron **OR** ondansetron (ZOFRAN) IV, mouth rinse, polyethylene glycol, traMADol  Current Outpatient Medications  Medication Instructions   acetaminophen (TYLENOL) 325-650 mg, Oral, Every 6 hours PRN   chlordiazePOXIDE (LIBRIUM) 25 mg, Oral, 3 times daily PRN   cloNIDine (CATAPRES) 0.1 mg, Oral, 2 times daily   ibuprofen (ADVIL) 200-400 mg, Oral, Every 6 hours PRN   omeprazole (PRILOSEC) 40 mg, Oral, Daily   propranolol (INDERAL) 40 mg, Oral, 2 times daily    Diet Orders (From admission, onward)     Start     Ordered   05/05/22 1029  Diet clear liquid Room service appropriate? Yes; Fluid consistency: Thin  Diet effective now       Question Answer Comment  Room service appropriate? Yes   Fluid consistency: Thin      05/05/22 1028            DVT prophylaxis: SCDs Start: 05/01/22 1512   Lab Results  Component Value Date   PLT 209 05/06/2022      Code Status: Full Code  Family Communication: Mother at bedside  Status is: Inpatient Remains inpatient appropriate because:  Continues with significant abdominal pain  Level of care: Telemetry  Consultants:  General surgery GI  Objective: Vitals:   05/05/22 2034 05/06/22 0505 05/06/22 0838 05/06/22 0940  BP: (!) 142/80 128/76 139/67 135/82  Pulse: 86 78 79   Resp: (!) 22 20 19    Temp: 99.1 F (37.3 C) 97.8 F (36.6 C) 98.9 F (37.2 C)   TempSrc: Oral Oral Oral   SpO2: 99% 97% 95%   Weight:      Height:        Intake/Output Summary (Last 24 hours) at 05/06/2022 1101 Last data filed at 05/06/2022 0507 Gross per 24 hour  Intake 1503.24 ml  Output --  Net 1503.24 ml   Wt Readings from Last 3 Encounters:  05/01/22 72 kg  03/20/20 68.9 kg  08/04/18 65.8 kg    Examination:  Constitutional: NAD Eyes: no scleral icterus ENMT: Mucous membranes are moist.  Neck: normal, supple Respiratory: clear to auscultation bilaterally, no wheezing, no crackles.  Cardiovascular: Regular rate and rhythm, no murmurs / rubs / gallops. No LE edema.  Abdomen: Tender to palpation in the lower pelvic area, bowel sounds positive Musculoskeletal: no clubbing / cyanosis.  Skin: no rashes Neurologic: Very poor effort,  has some weakness on the right side but exam is very inconsistent   Data Reviewed: I have independently reviewed following labs and imaging studies   CBC Recent Labs  Lab 05/01/22 1025 05/02/22 0306 05/04/22 0948 05/06/22 0456  WBC 11.1* 9.7 7.7 6.9  HGB 16.0* 12.8 11.4* 11.3*  HCT 47.6* 38.1 35.3* 35.9*  PLT 199 178 178 209  MCV 89.5 90.5 93.6 97.6  MCH 30.1 30.4 30.2 30.7  MCHC 33.6 33.6 32.3 31.5  RDW 14.3 14.5 15.7* 15.5    Recent Labs  Lab 05/01/22 1025 05/01/22 1038 05/02/22 0306 05/02/22 0852 05/03/22 0530 05/04/22 0456 05/04/22 0948 05/04/22 1315 05/05/22 0505 05/06/22 0456  NA 132*  --  132*  --  133* 136  --   --  134* 136  K 3.2*  --  3.2*  --  3.7 3.4*  --   --  3.6 4.1  CL 97*  --  101  --  102 101  --   --  104 106  CO2 22  --  23  --  24 24  --   --  23 21*   GLUCOSE 115*  --  165*  --  114* 135*  --   --  133* 98  BUN 15  --  8  --  12 18  --   --  23* 15  CREATININE 0.78  --  0.54  --  0.49 0.79  --   --  0.85 0.69  CALCIUM 9.6  --  8.5*  --  8.3* 8.9  --   --  8.6* 8.8*  AST 17  --  17  --   --   --   --   --   --   --   ALT 19  --  14  --   --   --   --   --   --   --   ALKPHOS 97  --  75  --   --   --   --   --   --   --   BILITOT 1.1  --  0.7  --   --   --   --   --   --   --   ALBUMIN 4.4  --  3.5  --   --   --   --   --   --   --   MG  --  2.1  --   --  1.7  --   --   --   --   --   LATICACIDVEN  --   --   --  1.6  --   --  1.0 1.0  --   --     ------------------------------------------------------------------------------------------------------------------ No results for input(s): "CHOL", "HDL", "LDLCALC", "TRIG", "CHOLHDL", "LDLDIRECT" in the last 72 hours.  Lab Results  Component Value Date   HGBA1C 5.6 03/21/2020   ------------------------------------------------------------------------------------------------------------------ No results for input(s): "TSH", "T4TOTAL", "T3FREE", "THYROIDAB" in the last 72 hours.  Invalid input(s): "FREET3"  Cardiac Enzymes No results for input(s): "CKMB", "TROPONINI", "MYOGLOBIN" in the last 168 hours.  Invalid input(s): "CK" ------------------------------------------------------------------------------------------------------------------ No results found for: "BNP"  CBG: No results for input(s): "GLUCAP" in the last 168 hours.  Recent Results (from the past 240 hour(s))  MRSA Next Gen by PCR, Nasal     Status: None   Collection Time: 05/01/22  5:17 PM   Specimen: Nasal Mucosa; Nasal Swab  Result Value Ref Range Status  MRSA by PCR Next Gen NOT DETECTED NOT DETECTED Final    Comment: (NOTE) The GeneXpert MRSA Assay (FDA approved for NASAL specimens only), is one component of a comprehensive MRSA colonization surveillance program. It is not intended to diagnose MRSA infection nor  to guide or monitor treatment for MRSA infections. Test performance is not FDA approved in patients less than 75 years old. Performed at [facility], [address], [place], [place] [address]   Culture, blood (Routine X 2) w Reflex to ID Panel     Status: None (Preliminary result)   Collection Time: 05/03/22  5:30 AM   Specimen: Right Antecubital; Blood  Result Value Ref Range Status   Specimen Description   Final    RIGHT ANTECUBITAL BLOOD Performed at [facility], [address], [place], [place] [address]    Special Requests   Final    AEROBIC BOTTLE ONLY Blood Culture results may not be optimal due to an inadequate volume of blood received in culture bottles Performed at [facility], [address], [place], [place] [address]    Culture   Final    NO GROWTH 3 DAYS Performed at [facility], [address] [place], [place] [address]    Report Status PENDING  Incomplete  Culture, blood (Routine X 2) w Reflex to ID Panel     Status: None (Preliminary result)   Collection Time: 05/03/22  5:31 AM   Specimen: Left Antecubital; Blood  Result Value Ref Range Status   Specimen Description   Final    LEFT ANTECUBITAL BLOOD Performed at [facility], [address] [place], [place] [address]    Special Requests   Final    AEROBIC BOTTLE ONLY Blood Culture adequate volume Performed at [facility], [address] [place], [place] [address]    Culture   Final    NO GROWTH 3 DAYS Performed at [facility], [address], [place], [place] [address]    Report Status PENDING  Incomplete     Radiology Studies: No results found.   Pamella Pert, MD, PhD [facility]  Between 7 am - 7 pm I am available, please contact me via Amion (for emergencies) or Securechat (non urgent messages)  Between 7 pm - 7 am I am not available, please contact night coverage MD/APP via Amion

## 2022-05-06 NOTE — Consult Note (Signed)
Reason for Consult:Intra abdominal/pelvic infection Referring Physician: Wendy Poet and [place], [name] is an 36 y.o. female. G6P5 S/P tubal ligation, salpingectomy Began with pain about 9 days ago, bilateral pelvic but also periumbilical and occasionally upper abdominal she reports Had + GC 2011 but reports no other STI that she is aware of Had a salpingectomy, partial vs total 2016(total salpingectomies were being done as early as 2014 for sterilizations)  Pertinent Gynecological History: Menses: generally regular periods can be heavy and crampy Bleeding:  Contraception: tubal ligation with her last C section, removal DES exposure:  Blood transfusions:  Sexually transmitted diseases: documented gonorrhea 2011 Previous GYN Procedures:  no GYN specific, C sections x 5 with tubal ligation with her last reports salpingectomy   Last mammogram:  Date:  Last pap: unsure, 2016, reports normal Date:  OB History: G6, P5   Menstrual History: Menarche age: 37 Patient's last menstrual period was 05/01/2022 (approximate).    Past Medical History:  Diagnosis Date   Anxiety    Cirrhosis of liver (HCC)    Cocaine use 08/04/2018   History of cocaine use 08/04/2018   Hypertension    Polysubstance abuse (HCC)    Renal disorder    Kidney Infection     Past Surgical History:  Procedure Laterality Date   ARTERY REPAIR     CESAREAN SECTION     x 5   MOUTH SURGERY     TEAR DUCT PROBING     TUBAL LIGATION     VENTRAL HERNIA REPAIR N/A 10/03/2016   Procedure: OPEN INCARCERATED HERNIA REPAIR VENTRAL ADULT;  Surgeon: Axel Filler, MD;  Location: [facility] OR;  Service: General;  Laterality: N/A;    Family History  Problem Relation Age of Onset   CAD Other    Diabetes Other    Hypertension Other    Cancer Other    Thyroid disease Other     Social History:  reports that she has been smoking cigarettes. She has been smoking an average of .5 packs per day. She has never used  smokeless tobacco. She reports current alcohol use. She reports that she does not currently use drugs after having used the following drugs: Cocaine.  Allergies:  Allergies  Allergen Reactions   Ibuprofen Anaphylaxis, Swelling and Other (See Comments)    Patient thinks it may have caused her throat to "swell"  (** PATIENT HAS BEEN TOLERATING THIS IN 2023**)   Latex Shortness Of Breath   Peanut-Containing Drug Products Anaphylaxis   Penicillins Anaphylaxis and Hives    Has patient had a PCN reaction causing immediate rash, facial/tongue/throat swelling, SOB or lightheadedness with hypotension: Yes Has patient had a PCN reaction causing severe rash involving mucus membranes or skin necrosis: unknown Has patient had a PCN reaction that required hospitalization No Has patient had a PCN reaction occurring within the last 10 years: No If all of the above answers are "NO", then may proceed with Cephalosporin use. Tolerated ceftriaxone 2018 & 2019    Acetaminophen Other (See Comments)    Causes patient to have an upset stomach- Tolerating this in 2023 (325 mg tablets)   Codeine Hives and Itching   Lisinopril Swelling   Ativan [Lorazepam] Other (See Comments)    "Hallucinations," per patient    Medications: I have reviewed the patient's current medications.  Review of Systems Per HPI Blood pressure 135/82, pulse 79, temperature 98.9 F (37.2 C), temperature source Oral, resp. rate 19, height 5' (1.524 m), weight 72  kg, last menstrual period 05/01/2022, SpO2 95 %. Physical Exam Abdomen is soft tender in all 4 quadrants worse lower quadrants no rebound no involuntary guarding Normal external genitalia Minimal blood in the vaginal vault No vulvar or vaginal lesions Gen probe Aptima PCR for GC chlamydia was collected Bimanual minimal to absent cervical motion tenderness with tenderness noted on bimanual, bilaterally and in the midline No adnexal masses are appreciated  Results for orders  placed or performed during the hospital encounter of 05/01/22 (from the past 48 hour(s))  Basic metabolic panel     Status: Abnormal   Collection Time: 05/05/22  5:05 AM  Result Value Ref Range   Sodium 134 (L) 135 - 145 mmol/L   Potassium 3.6 3.5 - 5.1 mmol/L   Chloride 104 98 - 111 mmol/L   CO2 23 22 - 32 mmol/L   Glucose, Bld 133 (H) 70 - 99 mg/dL    Comment: Glucose reference range applies only to samples taken after fasting for at least 8 hours.   BUN 23 (H) 6 - 20 mg/dL   Creatinine, Ser 1.30 0.44 - 1.00 mg/dL   Calcium 8.6 (L) 8.9 - 10.3 mg/dL   GFR, Estimated >86 >57 mL/min    Comment: (NOTE) Calculated using the CKD-EPI Creatinine Equation (2021)    Anion gap 7 5 - 15    Comment: Performed at [facility], [address] [place], [place] [address]  CBC     Status: Abnormal   Collection Time: 05/06/22  4:56 AM  Result Value Ref Range   WBC 6.9 4.0 - 10.5 K/uL   RBC 3.68 (L) 3.87 - 5.11 MIL/uL   Hemoglobin 11.3 (L) 12.0 - 15.0 g/dL   HCT 29.5 (L) 28.4 - 13.2 %   MCV 97.6 80.0 - 100.0 fL   MCH 30.7 26.0 - 34.0 pg   MCHC 31.5 30.0 - 36.0 g/dL   RDW 44.0 10.2 - 72.5 %   Platelets 209 150 - 400 K/uL   nRBC 0.0 0.0 - 0.2 %    Comment: Performed at [facility], [address] [place], [place] [address]  Basic metabolic panel     Status: Abnormal   Collection Time: 05/06/22  4:56 AM  Result Value Ref Range   Sodium 136 135 - 145 mmol/L   Potassium 4.1 3.5 - 5.1 mmol/L   Chloride 106 98 - 111 mmol/L   CO2 21 (L) 22 - 32 mmol/L   Glucose, Bld 98 70 - 99 mg/dL    Comment: Glucose reference range applies only to samples taken after fasting for at least 8 hours.   BUN 15 6 - 20 mg/dL   Creatinine, Ser 0.34 0.44 - 1.00 mg/dL   Calcium 8.8 (L) 8.9 - 10.3 mg/dL   GFR, Estimated >74 >25 mL/min    Comment: (NOTE) Calculated using the CKD-EPI Creatinine Equation (2021)    Anion gap 9 5 - 15    Comment: Performed at [facility], [address] [place], [place] [address]   CT HEAD WO CONTRAST ( )  Result Date: 05/06/2022 CLINICAL DATA:  Patient is unable to move the right foot this morning. 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 RADIATION DOSE REDUCTION: This exam was performed according to the departmental dose-optimization program which includes automated exposure control, adjustment of the  mA and/or kV according to patient size and/or use of iterative reconstruction technique. COMPARISON:  CT brain 03/20/2020, MRI head 03/20/2020 FINDINGS: Brain: Chronic left thalamic and left lentiform nucleus lacunar infarcts. There is also a chronic infarct in the right thalamus. Compared to prior exams there is an age indeterminate hypodensity in the left caudate head (series 2, image 15). No evidence of hydrocephalus. No hemorrhage. No extra-axial fluid collections. Vascular: No hyperdense vessel or unexpected calcification. Skull: Normal. Negative for fracture or focal lesion. Sinuses/Orbits: No acute finding. Other: None. IMPRESSION: Compared to prior exam there is a new hypodensity in the left caudate head. Although this has a chronic appearance, it was not visualized on prior imaging and is technically age indeterminate. If there is clinical concern for an acute infarct, this could be further assessed with a brain MRI. Electronically Signed   By: Lorenza Cambridge M.D.   On: 05/06/2022 12:01   CT Angio Abd/Pel w/ and/or w/o  Result Date: 05/04/2022 CLINICAL DATA:  Mid and lower abdominal pain. Evaluate for acute mesenteric ischemia. EXAM: CTA ABDOMEN AND PELVIS WITHOUT AND WITH CONTRAST TECHNIQUE: Multidetector CT imaging of the abdomen and pelvis was performed using the standard protocol during bolus administration of intravenous contrast. Multiplanar reconstructed images  and MIPs were obtained and reviewed to evaluate the vascular anatomy. RADIATION DOSE REDUCTION: This exam was performed according to the departmental dose-optimization program which includes automated exposure control, adjustment of the mA and/or kV according to patient size and/or use of iterative reconstruction technique. CONTRAST:  OMNIPAQUE IOHEXOL 350 MG/ML SOLN COMPARISON:  CT abdomen and pelvis  05/01/2022 and 08/04/2018 FINDINGS: VASCULAR Aorta: Normal caliber aorta without aneurysm, dissection, vasculitis or significant stenosis. Celiac: Patent without evidence of aneurysm, dissection, vasculitis or significant stenosis. Variant anatomy with only 2 main branches coming off the celiac trunk. The 2 main branches are the left gastric artery and the splenic artery. Common hepatic artery is coming off the left gastric artery in the expected location of an accessory left gastric artery. There is a small gastroduodenal artery. SMA: Patent without evidence of aneurysm, dissection, vasculitis or significant stenosis. Renals: Both renal arteries are patent without evidence of aneurysm, dissection, vasculitis, fibromuscular dysplasia or significant stenosis. IMA: Patent without evidence of aneurysm, dissection, vasculitis or significant stenosis. Inflow: Patent without evidence of aneurysm, dissection, vasculitis or significant stenosis. Proximal Outflow: Bilateral common femoral and visualized portions of the superficial and profunda femoral arteries are patent without evidence of aneurysm, dissection, vasculitis or significant stenosis. Veins: Portal veins and main mesenteric veins are patent without filling defects. IVC and renal veins are patent. No gross abnormality to the iliac veins. Review of the MIP images confirms the above findings. NON-VASCULAR Lower chest: Mild dependent changes in the posterior right lower lobe. Otherwise, the lung bases are clear. Hepatobiliary: 8 mm hypodensity in the anterior  liver is probably an incidental finding. Gallbladder is decompressed. No biliary dilatation. Pancreas: Unremarkable. No pancreatic ductal dilatation or surrounding inflammatory changes. Spleen: Normal in size without focal abnormality. Adrenals/Urinary Tract: Normal adrenal glands. Normal appearance of both kidneys hydronephrosis. 6 mm hypodensity in the right kidney lower pole on sequence 4, image 114 is too small to definitively characterize but likely represents a cyst. This structure does not require dedicated follow-up. No suspicious renal lesions. Mild wall thickening in the urinary bladder. Stomach/Bowel: Normal appearance of the stomach. Question a small hiatal hernia. New fluid and inflammatory changes in the lower abdomen and in the pelvis. Inflammatory changes are  centered around uterus and adnexal structures and near loops of small bowel in the lower abdomen. Mildly dilated loops of small bowel in the lower abdomen, largest measuring up to 3.3 cm. Inflammatory changes are also the near the sigmoid colon. Appendix is not confidently identified. Lymphatic: Mildly prominent retroperitoneal lymph nodes. Index lymph node between the aorta and IVC on sequence 11 image 100 measures 1.2 cm in the short axis and this lymph node measured approximately 0.7 cm on 08/04/2018. No significant lymph node enlargement in the pelvis. Reproductive: Fluid and stranding throughout the pelvis around the uterus and adnexal structures. No discrete adnexal lesion. Other: There is extensive stranding with a small amount of fluid throughout the lower abdomen and pelvis. Evidence for developing fluid collections in the pelvis. A collection along the left side of the pelvis on sequence 11 image 194 measures 4.8 x 2.7 cm. Evidence for small complex fluid collections in the pre rectal region on image 194. There is additional complex fluid between the uterus and right adnexal structures on image 172. Musculoskeletal: No acute bone  abnormality. IMPRESSION: VASCULAR 1. Arterial structures are widely patent without significant atherosclerotic disease. No evidence for arterial stenosis. 2. Variant celiac artery anatomy as described. NON-VASCULAR 1. 1. New fluid and inflammatory changes in the lower abdomen and pelvis. These inflammatory changes are near the uterus, adnexa and loops of bowel. Inflammation source is unclear. Differential diagnosis includes enteritis, colitis and possibly pelvic inflammatory disease. Dilatation of some small bowel loops suggests that small bowel could be the inflammatory source. 2. New small complex fluid collections in the pelvis. Early or developing small abscess collections cannot be excluded. Recommend short-term follow-up CT to evaluate these fluid collections. 3. Slightly enlarged retroperitoneal lymph nodes as described. Findings are nonspecific but could be reactive. Recommend attention to these on follow-up imaging. 4. Mild wall thickening in the urinary bladder is nonspecific due to incomplete distension and could be also be related to surrounding inflammatory changes. Electronically Signed   By: Richarda Overlie M.D.   On: 05/04/2022 14:19     CT HEAD WO CONTRAST ( )  Result Date: 05/06/2022 CLINICAL DATA:  Patient is unable to move the right foot this morning. 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 RADIATION DOSE REDUCTION: This exam was performed according to the departmental dose-optimization program which includes automated exposure control, adjustment of the mA and/or kV according to patient size and/or use of iterative reconstruction technique. COMPARISON:  CT brain 03/20/2020, MRI head 03/20/2020 FINDINGS: Brain: Chronic left thalamic and left lentiform nucleus lacunar infarcts. There is also a chronic infarct in the right thalamus. Compared to  prior exams there is an age indeterminate hypodensity in the left caudate head (series 2, image 15). No evidence of hydrocephalus. No hemorrhage. No extra-axial fluid collections. Vascular: No hyperdense vessel or unexpected calcification. Skull: Normal. Negative for fracture or focal lesion. Sinuses/Orbits: No acute finding. Other: None. IMPRESSION: Compared to prior exam there is a new hypodensity in the left caudate head. Although this has a chronic appearance, it was not visualized on prior imaging and is technically age indeterminate. If there is clinical concern for an acute infarct, this could be further assessed with a brain MRI. Electronically Signed   By: Lorenza Cambridge M.D.   On: 05/06/2022 12:01    Assessment/Plan: Acute febrile illness with pelvic/abdominal pain: suspected PID  without tubo ovarian abscess  I discussed this patient with the hospitalist team yesterday and earlier today.  I have reviewed her scan images and all of her pertinent labs.  Yesterday I recommended adding doxycycline to her antibiotic regimen of ciprofloxacin/flagyl which would empirically cover her for PID as well as colitis/enteritis. CT did not reveal any tubo ovarian process and for management of PID especially with a normal WBC this regimen would be recommended to complete a 10 day course.     It is often difficult, if not impossible to differentiate between the differentials above, so I recommended adding the doxycycline for appropriate coverage.  After my exam today I have no reason to change that management rationale.  She is less likely to have infection beyond the uterus, since she has had a tubal ligation, which, most of the time protects from infection beyond the uterus(endometritis) but not 100%.    I understand due to presentation and a strong family history continuing to be concerned regarding colitis/enteritis, specifically Crohn's.  I recommend continuing to manage down this dual track as PID  coverage only involves the addition of doxycycline.  Whether this is PID or not she is on appropriate antibiotics to cover either.  She has  had marked improvement in her fever curve which supports the antibiotics she is receiving.   If however, general surgery or GI or the hospitalist team feels addition of pulse steroids is indicated for Crohn's management, My opinion is the benefits of adding steroids far outweighs the negative impact that may have on PID therapy.  At this point, I do not have any further recommendations and I will make sure we continue to be aware of her clinical course.    Lazaro Arms 05/06/2022 05/06/2022

## 2022-05-06 NOTE — Progress Notes (Addendum)
Neurology Interprofessional Phone/EMR Consultation  Neurology was contacted by Dr. Elvera Lennox at [facility] regarding this patient with acute infarcts on MRI brain.  This is a 36 yo woman with hx prior CVAs, uncontrolled hypertension at home, heavy alcohol use, history of substance abuse, admitted with abdominal pain and fever with concern for possible early abscess or inflammation of the pelvis.  She is currently on antibiotics and general surgery, GYN, and GI have been consulted.  They have not determined yet if she will need surgery.  Hospitalist noted that she had less movement on her right side however this was fluctuating and she was able to ambulate intermittently.  She was also quite drowsy and asking for more pain medications.  Hospitalist thus ordered an MRI brain.  My personal review of these images showed numerous small areas of acute infarct in the right pons, left putamen, right thalamus, corpus callosum, and left frontal white matter.  Etiology favored to be watershed in the setting of relative hypotension.  Embolic etiology is also a possibility however she has no cortical infarcts which she would typically see in the setting of scattered emboli.  She did present with hypertensive emergency this admission and was down to systolic of 114 within the last 24 hours.  Neurology recommendations: - Permissive HTN x48 hrs goal BP <180/105. PRN labetalol or hydralazine if BP above these parameters. Avoid oral antihypertensives. Strict avoidance of even relative hypertension, maintain SBP > 135 - CTA H&N - TTE w/ bubble - Check A1c and LDL + add statin per guidelines - ASA 325mg  now f/b 81mg  daily. She has a listed allergy to ibuprofen but patient states she has tolerated aspirin in the past. Will hold on plavix for now until decision is made on whether or not patient will need surgery for pelvic abscess - q4 hr neuro checks - STAT head CT for any change in neuro exam - Tele - PT/OT/SLP - Stroke  education - Amb referral to neurology upon discharge  Neurology will see patient tomorrow at [facility] for inpatient consultation. Recommendations discussed with hospitalist Dr. by phone  [facility], MD [facility] [phone]  If 7pm- 7am, please page neurology on call as listed in AMION.  Time spent on Interprofessional Phone & EHR consult 20 min

## 2022-05-07 ENCOUNTER — Inpatient Hospital Stay (HOSPITAL_COMMUNITY): Payer: Self-pay

## 2022-05-07 DIAGNOSIS — I6389 Other cerebral infarction: Secondary | ICD-10-CM

## 2022-05-07 LAB — COMPREHENSIVE METABOLIC PANEL WITH GFR
ALT: 11 U/L (ref 0–44)
AST: 10 U/L — ABNORMAL LOW (ref 15–41)
Albumin: 2.7 g/dL — ABNORMAL LOW (ref 3.5–5.0)
Alkaline Phosphatase: 71 U/L (ref 38–126)
Anion gap: 8 (ref 5–15)
BUN: 12 mg/dL (ref 6–20)
CO2: 22 mmol/L (ref 22–32)
Calcium: 8.7 mg/dL — ABNORMAL LOW (ref 8.9–10.3)
Chloride: 105 mmol/L (ref 98–111)
Creatinine, Ser: 0.6 mg/dL (ref 0.44–1.00)
GFR, Estimated: >60 mL/min (ref >60)
Glucose, Bld: 91 mg/dL (ref 70–99)
Potassium: 3.6 mmol/L (ref 3.5–5.1)
Sodium: 135 mmol/L (ref 135–145)
Total Bilirubin: 0.7 mg/dL (ref 0.3–1.2)
Total Protein: 6.2 g/dL — ABNORMAL LOW (ref 6.5–8.1)

## 2022-05-07 LAB — PHOSPHORUS: Phosphorus: 4 mg/dL (ref 2.5–4.6)

## 2022-05-07 LAB — GC/CHLAMYDIA PROBE AMP (~~LOC~~) NOT AT ARMC
Chlamydia: NEGATIVE
Comment: NEGATIVE
Comment: NORMAL
Neisseria Gonorrhea: NEGATIVE

## 2022-05-07 LAB — LIPID PANEL
Cholesterol: 129 mg/dL (ref 0–200)
HDL: 23 mg/dL — ABNORMAL LOW (ref >40)
LDL Cholesterol: 84 mg/dL (ref 0–99)
Total CHOL/HDL Ratio: 5.6 ratio
Triglycerides: 112 mg/dL (ref <150)
VLDL: 22 mg/dL (ref 0–40)

## 2022-05-07 LAB — CBC
HCT: 36.5 % (ref 36.0–46.0)
Hemoglobin: 11.2 g/dL — ABNORMAL LOW (ref 12.0–15.0)
MCH: 29.9 pg (ref 26.0–34.0)
MCHC: 30.7 g/dL (ref 30.0–36.0)
MCV: 97.6 fL (ref 80.0–100.0)
Platelets: 283 10*3/uL (ref 150–400)
RBC: 3.74 MIL/uL — ABNORMAL LOW (ref 3.87–5.11)
RDW: 15.7 % — ABNORMAL HIGH (ref 11.5–15.5)
WBC: 5.5 10*3/uL (ref 4.0–10.5)
nRBC: 0 % (ref 0.0–0.2)

## 2022-05-07 LAB — HEMOGLOBIN A1C
Hgb A1c MFr Bld: 5.4 % (ref 4.8–5.6)
Mean Plasma Glucose: 108.28 mg/dL

## 2022-05-07 LAB — MAGNESIUM: Magnesium: 1.8 mg/dL (ref 1.7–2.4)

## 2022-05-07 MED ORDER — TRAMADOL HCL 50 MG PO TABS
50.0000 mg | ORAL_TABLET | Freq: Four times a day (QID) | ORAL | Status: DC | PRN
  Administered 2022-05-07 – 2022-05-09 (×7): 50 mg via ORAL
  Filled 2022-05-07 (×8): qty 1

## 2022-05-07 MED ORDER — HYDROMORPHONE HCL 1 MG/ML IJ SOLN
1.0000 mg | INTRAMUSCULAR | Status: DC | PRN
  Administered 2022-05-07 – 2022-05-10 (×17): 1 mg via INTRAVENOUS
  Filled 2022-05-07 (×18): qty 1

## 2022-05-07 MED ORDER — ATORVASTATIN CALCIUM 40 MG PO TABS
40.0000 mg | ORAL_TABLET | Freq: Every day | ORAL | Status: DC
  Administered 2022-05-07 – 2022-05-11 (×5): 40 mg via ORAL
  Filled 2022-05-07 (×5): qty 1

## 2022-05-07 MED ORDER — CHLORDIAZEPOXIDE HCL 5 MG PO CAPS
5.0000 mg | ORAL_CAPSULE | Freq: Three times a day (TID) | ORAL | Status: AC
  Administered 2022-05-07 (×3): 5 mg via ORAL
  Filled 2022-05-07 (×3): qty 1

## 2022-05-07 NOTE — Evaluation (Signed)
Speech Language Pathology Evaluation Patient Details Name: ADELISE BUSWELL MRN: [id number] DOB: 1986/05/11 Today's Date: 05/07/2022 Time:  -     Problem List:  Patient Active Problem List   Diagnosis Date Noted   Alcohol dependence (HCC) 05/02/2022   Hypokalemia 05/02/2022   Hepatic steatosis 05/01/2022   Abdominal pain 05/01/2022   Nausea and vomiting 05/01/2022   Cocaine abuse (HCC) 05/01/2022   Acute CVA (cerebrovascular accident) (HCC) 03/21/2020   Hypertensive urgency 03/21/2020   Bronchitis 03/21/2020   Asymptomatic bacteriuria 03/21/2020   Acute respiratory failure with hypoxia (HCC) 08/01/2017   Substance abuse (HCC) 08/01/2017   Constipation 08/01/2017   Anemia 08/01/2017   Rectal bleeding 08/01/2017   Alcohol withdrawal (HCC) 07/31/2017   Ventral hernia 10/04/2016   Incarcerated ventral hernia 10/03/2016   Anxiety 02/15/2014   Hypertension 02/15/2014   Past Medical History:  Past Medical History:  Diagnosis Date   Anxiety    Cirrhosis of liver (HCC)    Cocaine use 08/04/2018   History of cocaine use 08/04/2018   Hypertension    Polysubstance abuse (HCC)    Renal disorder    Kidney Infection    Past Surgical History:  Past Surgical History:  Procedure Laterality Date   ARTERY REPAIR     CESAREAN SECTION     x 5   MOUTH SURGERY     TEAR DUCT PROBING     TUBAL LIGATION     VENTRAL HERNIA REPAIR N/A 10/03/2016   Procedure: OPEN INCARCERATED HERNIA REPAIR VENTRAL ADULT;  Surgeon: Axel Filler, MD;  Location: [facility] OR;  Service: General;  Laterality: N/A;   HPI:  This is a 36 yo woman with hx prior CVAs, uncontrolled hypertension at home, heavy alcohol use, history of substance abuse, admitted with abdominal pain and fever with concern for possible early abscess or inflammation of the pelvis.  She is currently on antibiotics and general surgery, GYN, and GI have been consulted.  They have not determined yet if she will need surgery.  Hospitalist noted that  she had less movement on her right side however this was fluctuating and she was able to ambulate intermittently.  She was also quite drowsy and asking for more pain medications.  Hospitalist thus ordered an MRI brain.  Images showed numerous small areas of acute infarct in the right pons, left putamen, right thalamus, corpus callosum, and left frontal white matter.  Etiology favored to be watershed in the setting of relative hypotension.   Assessment / Plan / Recommendation Clinical Impression  Pt demonstrates mild cognitive impairment in the setting of new CVA but also reports pain and lethargy. Pt is attentive and participatory throughout. She was significantly disoriented to date and time and did not report awareness of having had a stroke, which SLP also did not mention. Pt with deficits in memory/recall, basic functional problem solving and organization. Language and speech in tact. It is possible that impairments are secondary to overall illness. Will need f/u as condition improves. Recommend Home health SLP at d/c for now.    SLP Assessment  SLP Recommendation/Assessment: Patient needs continued Speech Lanaguage Pathology Services SLP Visit Diagnosis: Attention and concentration deficit    Recommendations for follow up therapy are one component of a multi-disciplinary discharge planning process, led by the attending physician.  Recommendations may be updated based on patient status, additional functional criteria and insurance authorization.    Follow Up Recommendations       Assistance Recommended at Discharge  Intermittent Supervision/Assistance  Functional Status Assessment Patient has had a recent decline in their functional status and demonstrates the ability to make significant improvements in function in a reasonable and predictable amount of time.  Frequency and Duration min 1 x/week  2 weeks      SLP Evaluation Cognition  Overall Cognitive Status: Impaired/Different from  baseline Arousal/Alertness: Awake/alert Orientation Level: Oriented to person;Oriented to place;Disoriented to time;Oriented to situation;Other (comment) (Did not report awareness of CVA, SLP did not mention) Attention: Focused;Sustained;Selective Focused Attention: Appears intact Sustained Attention: Appears intact Selective Attention: Appears intact Memory: Impaired Memory Impairment: Retrieval deficit Awareness: Appears intact Problem Solving: Impaired Problem Solving Impairment: Functional basic Executive Function: Sequencing;Organizing Sequencing: Impaired Sequencing Impairment: Functional basic Organizing: Impaired Organizing Impairment: Functional basic Safety/Judgment: Appears intact       Comprehension  Auditory Comprehension Overall Auditory Comprehension: Appears within functional limits for tasks assessed    Expression Verbal Expression Overall Verbal Expression: Appears within functional limits for tasks assessed   Oral / Motor  Oral Motor/Sensory Function Overall Oral Motor/Sensory Function: Within functional limits Motor Speech Overall Motor Speech: Appears within functional limits for tasks assessed            Claudine Mouton 05/07/2022, 11:06 AM

## 2022-05-07 NOTE — Progress Notes (Signed)
  Inpatient Rehab Admissions Coordinator :  Per therapy recommendations, patient was screened for CIR candidacy by Ottie Glazier RN MSN. On initial eval mod assist 400 feet with RW.  I will await additional therapy progress before pursing CIR /Rehab Consult.  She may progress to not need CIR level rehab and be able to go home.  Please call me with any questions.  Ottie Glazier RN MSN Admissions Coordinator [phone]

## 2022-05-07 NOTE — Progress Notes (Addendum)
Subjective: CC: From an abdominal standpoint still having RLQ pain and nausea on CLD. No vomiting. No bm yesterday.   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 She reported fall yesterday after MRI. No staff in room when this occurred. Reports she was trying to get up to the restroom using RW and got caught, falling and hitting her face on metal railing and back on toilet. No loc. Denies HA, n/v, neck pain, cp, sob, or extremity pain. Is having some facial pain and back pain. No new n/t/w of extremities reported. She has walked with a walker in the room with staff since then. RN reports patient has already been seen by primary team and they are aware.   Objective: Vital signs in last 24 hours: Temp:  [97.5 F (36.4 C)-98.8 F (37.1 C)] 98.8 F (37.1 C) (09/14 0947) Pulse Rate:  [70-89] 81 (09/14 0947) Resp:  [14-18] 17 (09/14 0947) BP: (111-135)/(59-84) 132/84 (09/14 0947) SpO2:  [97 %-99 %] 97 % (09/14 0947) Last BM Date : 05/05/22  Intake/Output from previous day: No intake/output data recorded. Intake/Output this shift: No intake/output data recorded.  PE: Gen:  Alert, NAD Neck: No c-spine ttp or step offs.  Card:  Reg Pulm:  CTAB, no W/R/R, effort normal Abd: Soft, ND, RLQ ttp, +BS MSK: MAE's. No obvious deformity. Able active rom of bue and ble without reported pain. No midline t or l spine ttp or step offs.  Psych: A&Ox3   Lab Results:  Recent Labs    05/06/22 0456 05/07/22 0528  WBC 6.9 5.5  HGB 11.3* 11.2*  HCT 35.9* 36.5  PLT 209 283   BMET Recent Labs    05/06/22 0456 05/07/22 0528  NA 136 135  K 4.1 3.6  CL 106 105  CO2 21* 22  GLUCOSE 98 91  BUN 15 12  CREATININE 0.69 0.60  CALCIUM 8.8* 8.7*   PT/INR No results for input(s): "LABPROT", "INR" in the last 72 hours. CMP     Component Value Date/Time   NA 135 05/07/2022 0528   K 3.6 05/07/2022 0528   CL 105 05/07/2022 0528   CO2 22 05/07/2022 0528   GLUCOSE 91 05/07/2022 0528   BUN 12  05/07/2022 0528   CREATININE 0.60 05/07/2022 0528   CREATININE 0.69 09/23/2016 1442   CALCIUM 8.7 (L) 05/07/2022 0528   PROT 6.2 (L) 05/07/2022 0528   ALBUMIN 2.7 (L) 05/07/2022 0528   AST 10 (L) 05/07/2022 0528   ALT 11 05/07/2022 0528   ALKPHOS 71 05/07/2022 0528   BILITOT 0.7 05/07/2022 0528   GFRNONAA >60 05/07/2022 0528   GFRNONAA >89 09/23/2016 1442   GFRAA >60 05/09/2020 0524   GFRAA >89 09/23/2016 1442   Lipase     Component Value Date/Time   LIPASE 39 05/01/2022 1025    Studies/Results: DG Pelvis 1-2 Views  Result Date: 05/07/2022 CLINICAL DATA:  Trauma, fall EXAM: PELVIS - 1-2 VIEW COMPARISON:  None Available. FINDINGS: No fracture or dislocation is seen.  SI joints are symmetrical. IMPRESSION: No fracture or dislocation is seen. Electronically Signed   By: Ernie Avena M.D.   On: 05/07/2022 09:36   CT ANGIO HEAD W OR WO CONTRAST  Result Date: 05/06/2022 CLINICAL DATA:  Stroke follow-up. EXAM: CT ANGIOGRAPHY HEAD AND NECK TECHNIQUE: Multidetector CT imaging of the head and neck was performed using the standard protocol during bolus administration of intravenous contrast. Multiplanar CT image reconstructions and MIPs were obtained to evaluate  the vascular anatomy. Carotid stenosis measurements (when applicable) are obtained utilizing NASCET criteria, using the distal internal carotid diameter as the denominator. RADIATION DOSE REDUCTION: This exam was performed according to the departmental dose-optimization program which includes automated exposure control, adjustment of the mA and/or kV according to patient size and/or use of iterative reconstruction technique. CONTRAST:  65mL OMNIPAQUE IOHEXOL 350 MG/ML SOLN COMPARISON:  Head and neck CTA 03/21/2020 FINDINGS: CTA NECK FINDINGS Aortic arch: Standard 3 vessel aortic arch. Widely patent arch vessel origins. Right carotid system: Patent and smooth without evidence of stenosis or dissection. Tortuous proximal ICA. Left  carotid system: Patent and smooth without evidence of stenosis or dissection. Tortuous mid cervical ICA. Vertebral arteries: Patent, smooth, and codominant without evidence of stenosis or dissection. Skeleton: Widespread dental caries and periapical lucencies. Other neck: No evidence of cervical lymphadenopathy or mass. Upper chest: Clear lung apices. Review of the MIP images confirms the above findings CTA HEAD FINDINGS Anterior circulation: The internal carotid arteries are widely patent from skull base to carotid termini. ACAs and MCAs are patent without evidence of a proximal branch occlusion or significant proximal stenosis. There is a new severe distal left A2 stenosis. No aneurysm is identified. Posterior circulation: The intracranial vertebral arteries are widely patent to the basilar. Patent PICA, AICA, and SCA origins are seen bilaterally. The basilar artery is widely patent. There is a large left posterior communicating artery with absent left P1 segment. Both PCAs are patent without evidence of a significant proximal stenosis. No aneurysm is identified. Venous sinuses: Patent. Anatomic variants: Fetal left PCA. Review of the MIP images confirms the above findings IMPRESSION: 1. No large vessel occlusion or significant proximal stenosis in the head or neck. 2. New severe distal left A2 stenosis. Electronically Signed   By: Sebastian Ache M.D.   On: 05/06/2022 18:17   CT ANGIO NECK W OR WO CONTRAST  Result Date: 05/06/2022 CLINICAL DATA:  Stroke follow-up. EXAM: CT ANGIOGRAPHY HEAD AND NECK TECHNIQUE: Multidetector CT imaging of the head and neck was performed using the standard protocol during bolus administration of intravenous contrast. Multiplanar CT image reconstructions and MIPs were obtained to evaluate the vascular anatomy. Carotid stenosis measurements (when applicable) are obtained utilizing NASCET criteria, using the distal internal carotid diameter as the denominator. RADIATION DOSE  REDUCTION: This exam was performed according to the departmental dose-optimization program which includes automated exposure control, adjustment of the mA and/or kV according to patient size and/or use of iterative reconstruction technique. CONTRAST:  55mL OMNIPAQUE IOHEXOL 350 MG/ML SOLN COMPARISON:  Head and neck CTA 03/21/2020 FINDINGS: CTA NECK FINDINGS Aortic arch: Standard 3 vessel aortic arch. Widely patent arch vessel origins. Right carotid system: Patent and smooth without evidence of stenosis or dissection. Tortuous proximal ICA. Left carotid system: Patent and smooth without evidence of stenosis or dissection. Tortuous mid cervical ICA. Vertebral arteries: Patent, smooth, and codominant without evidence of stenosis or dissection. Skeleton: Widespread dental caries and periapical lucencies. Other neck: No evidence of cervical lymphadenopathy or mass. Upper chest: Clear lung apices. Review of the MIP images confirms the above findings CTA HEAD FINDINGS Anterior circulation: The internal carotid arteries are widely patent from skull base to carotid termini. ACAs and MCAs are patent without evidence of a proximal branch occlusion or significant proximal stenosis. There is a new severe distal left A2 stenosis. No aneurysm is identified. Posterior circulation: The intracranial vertebral arteries are widely patent to the basilar. Patent PICA, AICA, and SCA origins are seen bilaterally.  The basilar artery is widely patent. There is a large left posterior communicating artery with absent left P1 segment. Both PCAs are patent without evidence of a significant proximal stenosis. No aneurysm is identified. Venous sinuses: Patent. Anatomic variants: Fetal left PCA. Review of the MIP images confirms the above findings IMPRESSION: 1. No large vessel occlusion or significant proximal stenosis in the head or neck. 2. New severe distal left A2 stenosis. Electronically Signed   By: Sebastian Ache M.D.   On: 05/06/2022 18:17    ECHOCARDIOGRAM COMPLETE  Result Date: 05/06/2022    ECHOCARDIOGRAM REPORT   Patient Name:   [name] Date of Exam: 05/06/2022 Medical Rec #:  [id number]          Height:       60.0 in Accession #:    [id number]         Weight:       158.7 lb Date of Birth:  October 10, 1985           BSA:          1.692 m Patient Age:    36 years           BP:           117/77 mmHg Patient Gender: F                  HR:           80 bpm. Exam Location:  Inpatient Procedure: 2D Echo, Cardiac Doppler and Color Doppler Indications:    Stroke  History:        Patient has prior history of Echocardiogram examinations, most                 recent 03/21/2020. Risk Factors:Hypertension.  Sonographer:    Eduard Roux Referring Phys: [id number] Daylene Katayama GHERGHE IMPRESSIONS  1. Left ventricular ejection fraction, by estimation, is 60 to 65%. The left ventricle has normal function. The left ventricle has no regional wall motion abnormalities. There is mild concentric left ventricular hypertrophy. Left ventricular diastolic parameters are consistent with Grade II diastolic dysfunction (pseudonormalization).  2. Right ventricular systolic function is normal. The right ventricular size is normal. There is normal pulmonary artery systolic pressure. The estimated right ventricular systolic pressure is 16.2 mmHg.  3. Left atrial size was mildly dilated.  4. The mitral valve is normal in structure. No evidence of mitral valve regurgitation. No evidence of mitral stenosis.  5. The aortic valve is tricuspid. Aortic valve regurgitation is not visualized. No aortic stenosis is present.  6. The inferior vena cava is normal in size with greater than 50% respiratory variability, suggesting right atrial pressure of 3 mmHg. FINDINGS  Left Ventricle: Left ventricular ejection fraction, by estimation, is 60 to 65%. The left ventricle has normal function. The left ventricle has no regional wall motion abnormalities. The left ventricular internal cavity size  was normal in size. There is  mild concentric left ventricular hypertrophy. Left ventricular diastolic parameters are consistent with Grade II diastolic dysfunction (pseudonormalization). Right Ventricle: The right ventricular size is normal. No increase in right ventricular wall thickness. Right ventricular systolic function is normal. There is normal pulmonary artery systolic pressure. The tricuspid regurgitant velocity is 1.82 m/s, and  with an assumed right atrial pressure of 3 mmHg, the estimated right ventricular systolic pressure is 16.2 mmHg. Left Atrium: Left atrial size was mildly dilated. Right Atrium: Right atrial size was normal in size. Pericardium: Trivial pericardial effusion is  present. Mitral Valve: The mitral valve is normal in structure. No evidence of mitral valve regurgitation. No evidence of mitral valve stenosis. Tricuspid Valve: The tricuspid valve is normal in structure. Tricuspid valve regurgitation is trivial. Aortic Valve: The aortic valve is tricuspid. Aortic valve regurgitation is not visualized. No aortic stenosis is present. Aortic valve peak gradient measures 10.4 mmHg. Pulmonic Valve: The pulmonic valve was normal in structure. Pulmonic valve regurgitation is trivial. Aorta: The aortic root is normal in size and structure. Venous: The inferior vena cava is normal in size with greater than 50% respiratory variability, suggesting right atrial pressure of 3 mmHg. IAS/Shunts: No atrial level shunt detected by color flow Doppler.  LEFT VENTRICLE PLAX 2D LVIDd:         4.30 cm   Diastology LVIDs:         2.90 cm   LV e' medial:    5.63 cm/s LV PW:         1.50 cm   LV E/e' medial:  17.8 LV IVS:        1.50 cm   LV e' lateral:   7.51 cm/s LVOT diam:     1.90 cm   LV E/e' lateral: 13.3 LV SV:         75 LV SV Index:   45 LVOT Area:     2.84 cm  RIGHT VENTRICLE             IVC RV Basal diam:  2.90 cm     IVC diam: 1.50 cm RV S prime:     13.00 cm/s TAPSE (M-mode): 2.5 cm LEFT ATRIUM              Index        RIGHT ATRIUM           Index LA diam:        3.10 cm 1.83 cm/m   RA Area:     15.40 cm LA Vol (A2C):   53.7 ml 31.74 ml/m  RA Volume:   38.90 ml  22.99 ml/m LA Vol (A4C):   62.7 ml 37.06 ml/m LA Biplane Vol: 59.3 ml 35.05 ml/m  AORTIC VALVE                 PULMONIC VALVE AV Area (Vmax): 2.40 cm     PV Vmax:       0.75 m/s AV Vmax:        161.00 cm/s  PV Peak grad:  2.2 mmHg AV Peak Grad:   10.4 mmHg LVOT Vmax:      136.00 cm/s LVOT Vmean:     99.100 cm/s LVOT VTI:       0.266 m  AORTA Ao Root diam: 3.40 cm Ao Asc diam:  3.30 cm MITRAL VALVE                TRICUSPID VALVE MV Area (PHT): 3.06 cm     TR Peak grad:   13.2 mmHg MV Decel Time: 248 msec     TR Vmax:        182.00 cm/s MV E velocity: 100.00 cm/s MV A velocity: 87.50 cm/s   SHUNTS MV E/A ratio:  1.14         Systemic VTI:  0.27 m                             Systemic Diam: 1.90 cm [employer] Electronically signed by  Dalton McleanMD Signature Date/Time: 05/06/2022/5:45:29 PM    Final    MR BRAIN WO CONTRAST  Result Date: 05/06/2022 CLINICAL DATA:  Acute neuro deficit. EXAM: MRI HEAD WITHOUT CONTRAST TECHNIQUE: Multiplanar, multiecho pulse sequences of the brain and surrounding structures were obtained without intravenous contrast. COMPARISON:  CT head 05/06/2022.  MRI head 03/20/2020 FINDINGS: Brain: Multiple areas of restricted diffusion compatible with acute infarct. These are all small areas of infarct involving the right pons, left putamen, right thalamus, corpus callosum, and the left frontal white matter. Chronic infarcts in the pons especially on the right. Chronic lacunar infarcts in the thalamus bilaterally. Chronic infarcts in the basal ganglia bilaterally. Multiple foci of chronic hemorrhage involving the pons. Chronic microhemorrhage in the right thalamus and right putamen. No hydrocephalus or mass lesion. Vascular: Normal arterial flow voids Skull and upper cervical spine: Negative Sinuses/Orbits: Mild mucosal  edema paranasal sinuses. Negative orbit Other: None IMPRESSION: Numerous small areas of acute infarct as described above. Correlate with recent history of hypertension or hypotension. Cerebral emboli possible however no cortical infarct is seen as would be expected with emboli. Advanced for age chronic microvascular ischemia and multiple areas of chronic microhemorrhage. Correlate with risk factors for small vessel disease. Electronically Signed   By: Marlan Palau M.D.   On: 05/06/2022 14:51   CT HEAD WO CONTRAST ( )  Result Date: 05/06/2022 CLINICAL DATA:  Patient is unable to move the right foot this morning. 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 RADIATION DOSE REDUCTION: This exam was performed according to the departmental dose-optimization program which includes automated exposure control, adjustment of the mA and/or kV according to patient size and/or use of iterative reconstruction technique. COMPARISON:  CT brain 03/20/2020, MRI head 03/20/2020 FINDINGS: Brain: Chronic left thalamic and left lentiform nucleus lacunar infarcts. There is also a chronic infarct in the right thalamus. Compared to prior exams there is an age indeterminate hypodensity in the left caudate head (series 2, image 15). No evidence of hydrocephalus. No hemorrhage. No extra-axial fluid collections. Vascular: No hyperdense vessel or unexpected calcification. Skull: Normal. Negative for fracture or focal lesion. Sinuses/Orbits: No acute finding. Other: None. IMPRESSION: Compared to prior exam there is a new hypodensity in the left caudate head. Although this has a chronic appearance, it was not visualized on prior imaging and is technically age indeterminate. If there is clinical concern for an acute infarct, this could be further assessed with a brain MRI. Electronically  Signed   By: Lorenza Cambridge M.D.   On: 05/06/2022 12:01    Anti-infectives: Anti-infectives (From admission, onward)    Start     Dose/Rate Route Frequency Ordered Stop   05/05/22 1315  doxycycline (VIBRA-TABS) tablet 100 mg        100 mg Oral Every 12 hours 05/05/22 1223     05/04/22 2200  metroNIDAZOLE (FLAGYL) IVPB 500 mg        500 mg 100 mL/hr over 60 Minutes Intravenous Every 12 hours 05/04/22 1434     05/04/22 1530  metroNIDAZOLE (FLAGYL) IVPB 500 mg        500 mg 100 mL/hr over 60 Minutes Intravenous  Once 05/04/22 1434 05/04/22 1645   05/04/22 1530  ciprofloxacin (CIPRO) IVPB 400 mg        400 mg 200 mL/hr over 60 Minutes Intravenous 2 times daily 05/04/22 1440  05/04/22 1515  metroNIDAZOLE (FLAGYL) IVPB 500 mg  Status:  Discontinued        500 mg 100 mL/hr over 60 Minutes Intravenous Every 12 hours 05/04/22 1428 05/04/22 1434        Assessment/Plan RLQ abdominal pain Fever Pelvic abscesses  36 year old female who presents with 1 week of progressively worsening right lower quadrant abdominal pain, fever, vomiting.  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  CTA abdomen performed 9/11 shows development of new fluid and inflammatory changes in the lower abdomen and pelvis - No indication for emergency surgery - Agree with IV abx - Nothing drainable at this time per IR. Monitor to see if she will need a repeat scan in the coming days  - Constellation of symptoms, her significant family history of Crohn's and CT findings are most suspicious for significant Crohn's disease. GI following. Another possible etiology is PID for which GYN has seen for, recommended Cipro/Flagyl + Doxy x 10d course. GYN ok w/ steroids if GI felt they were indicated - CCS will follow   FEN - Keep on FLD VTE - SCD's, ok for DVT ppx from surgical perspective. Now on ASA ID - Cipro/flagyl/Doxy (severe PCN allergy).    - Per [facility] -  New  CVA - neuro to see, on asa GLF HTN urgency Alcohol dependence Polysubstance abuse    LOS: 5 days    Jacinto Halim , [facility] Surgery 05/07/2022, 11:25 AM Please see Amion for pager number during day hours 7:00am-4:30pm

## 2022-05-07 NOTE — Progress Notes (Signed)
PROGRESS NOTE  [name] ZOX:[id number] DOB: June 04, 1986 DOA: 05/01/2022 PCP: Patient, No Pcp Per   LOS: 5 days   Brief Narrative / Interim history: [name] is a 36 y.o. female with medical history significant of anxiety, liver cirrhosis, cocaine abuse, hypertension, unspecified renal disorder, history of other nonhemorrhagic CVA, ventral hernia, constipation who is coming to the emergency department due to abdominal pain associated with nausea, dry heaving and constipation for the past 3 days.  On presentation to the emergency department, patient was hypertensive with BP up to 189/128.  She was started on nicardipine drip.  CT abdomen pelvis as well as pelvic ultrasound was completed without clear etiology of abdominal pain found.  Due to fevers and continued abdominal pain, CTA abdomen pelvis was done which revealed fluid and inflammatory changes in the lower abdomen and pelvis, new small complex fluid collections in the pelvis.  Patient was started on antibiotics and general surgery consulted.  Significant events: 9/8-admission 9/11-persistent fever, CTA shows new small complex fluid collection in the pelvis, general surgery consulted 9/12-GI consulted due to concern for Crohn's 9/13-MRI of the brain showing multiple areas of restricted diffusion compatible with acute infarcts in the right pons, left putamen, right thalamus, corpus callosum and left frontal white matter.  Significant imaging / results / micro data: CT abdomen pelvis, pelvic ultrasound 9/8-no significant acute findings CTA 9/11-new fluid and inflammatory changes in the lower abdomen and pelvis next to the uterus, loops of bowel without clear source.  This represents enteritis, colitis and possibly PID.  New small complex fluid collection in the pelvis, early versus developing small abscess. Blood cultures 9/10-no growth to date, pending  Subjective / 24h Interval events: Seems a little bit more comfortable  this morning.  No new focal deficits.  Per night RN may have had a fall, however it was not clear if she truly fell, as the patient's mother denies that she had a fall  Assesement and Plan: Principal Problem:   Hypertensive urgency Active Problems:   Alcohol withdrawal (HCC)   Substance abuse (HCC)   Hepatic steatosis   Abdominal pain   Nausea and vomiting   Cocaine abuse (HCC)   Alcohol dependence (HCC)   Hypokalemia   Principal problem Intra-abdominal infection-she initially presented with abdominal pain, nausea, vomiting, initial work-up with a CT abdomen and pelvis and a pelvic ultrasound was unremarkable.  Due to persistent pain and fever, underwent a repeat CT angiogram which showed fluid and inflammatory changes in the lower abdomen and pelvis, new small complex fluid collections in the pelvis.  General surgery was consulted and she was started on antibiotics.  OB/GYN saw the patient as well.  GI was also consulted due to strong family history of Crohn's disease -She will need a repeat CT scan, timing per GI  Active problems Acute multiple punctate CVAs-patient was complaining of right-sided heaviness this morning, but able to ambulate in neuro exam was very inconsistent.  An MRI of the brain showed multiple areas of small infarcts involving the right pons, left putamen, right thalamus, corpus callosum and left frontal white matter.  This is probably related to her fluctuation in her blood pressure, when she came in her systolic was in the 200s initially requiring Cardene drip.  Blood pressure yesterday was as low as 110s systolic.  Discussed with neurology, placed on aspirin, allow permissive hypertension -Stroke work-up underway, CTA without significant large vessel obstructions, but new severe distal left A2 stenosis.  2D echo shows  LVEF 60-65%, no WMA, LVH, grade 2 diastolic dysfunction.  RV was normal.  Limited bubble study echo pending per neurology.  A1c is 5.4.  Lipid panel shows  an LDL of 84.  Hypertensive urgency without organ failure -initially placed on nicardipine, now weaned off.  Subsequently she was on amlodipine, clonidine, hydralazine.  Due to CVA identified 9/13, allow permissive hypertension.  Hold antihypertensives.  Blood pressure still within normal limits..  Labetalol IV PRN for systolic greater than 200  Alcohol dependence -Librium 3 times daily for withdrawal - wean off to 5 mg 3 times daily today   Substance abuse -UDS is negative for cocaine, admitted last cocaine use 2 weeks ago. UDS positive for opiate   Tobacco abuse -Nicotine patch    Scheduled Meds:   stroke: early stages of recovery book   Does not apply Once   aspirin EC  81 mg Oral Daily   chlordiazePOXIDE  5 mg Oral TID   Chlorhexidine Gluconate Cloth  6 each Topical Daily   docusate sodium  100 mg Oral BID   doxycycline  100 mg Oral Q12H   folic acid  1 mg Oral Daily   metoCLOPramide (REGLAN) injection  10 mg Intravenous Q6H   multivitamin with minerals  1 tablet Oral Daily   nicotine  21 mg Transdermal Daily   pantoprazole  40 mg Oral BID AC   thiamine  100 mg Oral Daily   Or   thiamine  100 mg Intravenous Daily   Continuous Infusions:  ciprofloxacin 400 mg (05/07/22 0930)   metronidazole 500 mg (05/07/22 0929)   PRN Meds:.acetaminophen, HYDROmorphone (DILAUDID) injection, hydrOXYzine, labetalol, ondansetron **OR** ondansetron (ZOFRAN) IV, mouth rinse, polyethylene glycol, traMADol  Current Outpatient Medications  Medication Instructions   acetaminophen (TYLENOL) 325-650 mg, Oral, Every 6 hours PRN   chlordiazePOXIDE (LIBRIUM) 25 mg, Oral, 3 times daily PRN   cloNIDine (CATAPRES) 0.1 mg, Oral, 2 times daily   ibuprofen (ADVIL) 200-400 mg, Oral, Every 6 hours PRN   omeprazole (PRILOSEC) 40 mg, Oral, Daily   propranolol (INDERAL) 40 mg, Oral, 2 times daily    Diet Orders (From admission, onward)     Start     Ordered   05/07/22 0925  Diet full liquid Room service  appropriate? Yes; Fluid consistency: Thin  Diet effective now       Question Answer Comment  Room service appropriate? Yes   Fluid consistency: Thin      05/07/22 0924            DVT prophylaxis: SCDs Start: 05/01/22 1512   Lab Results  Component Value Date   PLT 283 05/07/2022      Code Status: Full Code  Family Communication: Mother at bedside  Status is: Inpatient Remains inpatient appropriate because: Continues with significant abdominal pain  Level of care: Telemetry  Consultants:  General surgery GI  Objective: Vitals:   05/06/22 2007 05/06/22 2358 05/07/22 0535 05/07/22 0947  BP: (!) 111/59 122/80 135/84 132/84  Pulse: 89 70 74 81  Resp: Temp: 98.6 F (37 C) 97.8 F (36.6 C) (!) 97.5 F (36.4 C) 98.8 F (37.1 C)  TempSrc: Oral Oral Oral Oral  SpO2: 98% 99% 97% 97%  Weight:      Height:       No intake or output data in the 24 hours ending 05/07/22 1112  Wt Readings from Last 3 Encounters:  05/01/22 72 kg  03/20/20 68.9 kg  08/04/18 65.8  kg    Examination:  Constitutional: NAD Eyes: lids and conjunctivae normal, no scleral icterus ENMT: mmm Neck: normal, supple Respiratory: clear to auscultation bilaterally, no wheezing, no crackles. Normal respiratory effort.  Cardiovascular: Regular rate and rhythm, no murmurs / rubs / gallops. No LE edema. Abdomen: soft, no distention, no tenderness. Bowel sounds positive.  Skin: no rashes Neurologic: still some delayed responses, inconsistent exam, but appears to move all 4 extremities equally strength appears to be equal   Data Reviewed: I have independently reviewed following labs and imaging studies   CBC Recent Labs  Lab 05/01/22 1025 05/02/22 0306 05/04/22 0948 05/06/22 0456 05/07/22 0528  WBC 11.1* 9.7 7.7 6.9 5.5  HGB 16.0* 12.8 11.4* 11.3* 11.2*  HCT 47.6* 38.1 35.3* 35.9* 36.5  PLT 199 178 178 209 283  MCV 89.5 90.5 93.6 97.6 97.6  MCH 30.1 30.4 30.2 30.7 29.9  MCHC  33.6 33.6 32.3 31.5 30.7  RDW 14.3 14.5 15.7* 15.5 15.7*     Recent Labs  Lab 05/01/22 1025 05/01/22 1038 05/02/22 0306 05/02/22 0852 05/03/22 0530 05/04/22 0456 05/04/22 0948 05/04/22 1315 05/05/22 0505 05/06/22 0456 05/07/22 0528  NA 132*  --  132*  --  133* 136  --   --  134* 136 135  K 3.2*  --  3.2*  --  3.7 3.4*  --   --  3.6 4.1 3.6  CL 97*  --  101  --  102 101  --   --  104 106 105  CO2 22  --  23  --  24 24  --   --  23 21* 22  GLUCOSE 115*  --  165*  --  114* 135*  --   --  133* 98 91  BUN 15  --  8  --  12 18  --   --  23* 15 12  CREATININE 0.78  --  0.54  --  0.49 0.79  --   --  0.85 0.69 0.60  CALCIUM 9.6  --  8.5*  --  8.3* 8.9  --   --  8.6* 8.8* 8.7*  AST 17  --  17  --   --   --   --   --   --   --  10*  ALT 19  --  14  --   --   --   --   --   --   --  11  ALKPHOS 97  --  75  --   --   --   --   --   --   --  71  BILITOT 1.1  --  0.7  --   --   --   --   --   --   --  0.7  ALBUMIN 4.4  --  3.5  --   --   --   --   --   --   --  2.7*  MG  --  2.1  --   --  1.7  --   --   --   --   --  1.8  LATICACIDVEN  --   --   --  1.6  --   --  1.0 1.0  --   --   --   HGBA1C  --   --   --   --   --   --   --   --   --   --  5.4     ------------------------------------------------------------------------------------------------------------------ Recent Labs    05/07/22 0528  CHOL 129  HDL 23*  LDLCALC 84  TRIG 875  CHOLHDL 5.6    Lab Results  Component Value Date   HGBA1C 5.4 05/07/2022   ------------------------------------------------------------------------------------------------------------------ No results for input(s): "TSH", "T4TOTAL", "T3FREE", "THYROIDAB" in the last 72 hours.  Invalid input(s): "FREET3"  Cardiac Enzymes No results for input(s): "CKMB", "TROPONINI", "MYOGLOBIN" in the last 168 hours.  Invalid input(s): "CK" ------------------------------------------------------------------------------------------------------------------ No  results found for: "BNP"  CBG: No results for input(s): "GLUCAP" in the last 168 hours.  Recent Results (from the past 240 hour(s))  MRSA Next Gen by PCR, Nasal     Status: None   Collection Time: 05/01/22  5:17 PM   Specimen: Nasal Mucosa; Nasal Swab  Result Value Ref Range Status   MRSA by PCR Next Gen NOT DETECTED NOT DETECTED Final    Comment: (NOTE) The GeneXpert MRSA Assay (FDA approved for NASAL specimens only), is one component of a comprehensive MRSA colonization surveillance program. It is not intended to diagnose MRSA infection nor to guide or monitor treatment for MRSA infections. Test performance is not FDA approved in patients less than 19 years old. Performed at [facility], [address] [place], [place] [address]   Culture, blood (Routine X 2) w Reflex to ID Panel     Status: None (Preliminary result)   Collection Time: 05/03/22  5:30 AM   Specimen: Right Antecubital; Blood  Result Value Ref Range Status   Specimen Description   Final    RIGHT ANTECUBITAL BLOOD Performed at [facility], [address], [place], [place] [address]    Special Requests   Final    AEROBIC BOTTLE ONLY Blood Culture results may not be optimal due to an inadequate volume of blood received in culture bottles Performed at [facility], [address] [place], [place] [address]    Culture   Final    NO GROWTH 4 DAYS Performed at [facility], [address], [place], [place] [address]    Report Status PENDING  Incomplete  Culture, blood (Routine X 2) w Reflex to ID Panel     Status: None (Preliminary result)   Collection Time: 05/03/22  5:31 AM   Specimen: Left Antecubital; Blood  Result Value Ref Range Status   Specimen Description   Final    LEFT ANTECUBITAL BLOOD Performed at [facility], [address], [place], [place] [address]    Special Requests   Final    AEROBIC BOTTLE ONLY Blood Culture adequate  volume Performed at [facility], [address], [place], [place] [address]    Culture   Final    NO GROWTH 4 DAYS Performed at [facility], [address], [place], [place] [address]    Report Status PENDING  Incomplete     Radiology Studies: DG Pelvis 1-2 Views  Result Date: 05/07/2022 CLINICAL DATA:  Trauma, fall EXAM: PELVIS - 1-2 VIEW COMPARISON:  None Available. FINDINGS: No fracture or dislocation is seen.  SI joints are symmetrical. IMPRESSION: No fracture or dislocation is seen. Electronically Signed   By: Ernie Avena M.D.   On: 05/07/2022 09:36   CT ANGIO HEAD W OR WO CONTRAST  Result Date: 05/06/2022 CLINICAL DATA:  Stroke follow-up. EXAM: CT ANGIOGRAPHY HEAD AND NECK TECHNIQUE: Multidetector CT imaging of the head and neck was performed using the standard protocol  during bolus administration of intravenous contrast. Multiplanar CT image reconstructions and MIPs were obtained to evaluate the vascular anatomy. Carotid stenosis measurements (when applicable) are obtained utilizing NASCET criteria, using the distal internal carotid diameter as the denominator. RADIATION DOSE REDUCTION: This exam was performed according to the departmental dose-optimization program which includes automated exposure control, adjustment of the mA and/or kV according to patient size and/or use of iterative reconstruction technique. CONTRAST:  75mL OMNIPAQUE IOHEXOL 350 MG/ML SOLN COMPARISON:  Head and neck CTA 03/21/2020 FINDINGS: CTA NECK FINDINGS Aortic arch: Standard 3 vessel aortic arch. Widely patent arch vessel origins. Right carotid system: Patent and smooth without evidence of stenosis or dissection. Tortuous proximal ICA. Left carotid system: Patent and smooth without evidence of stenosis or dissection. Tortuous mid cervical ICA. Vertebral arteries: Patent, smooth, and codominant without evidence of stenosis or dissection. Skeleton: Widespread dental caries and  periapical lucencies. Other neck: No evidence of cervical lymphadenopathy or mass. Upper chest: Clear lung apices. Review of the MIP images confirms the above findings CTA HEAD FINDINGS Anterior circulation: The internal carotid arteries are widely patent from skull base to carotid termini. ACAs and MCAs are patent without evidence of a proximal branch occlusion or significant proximal stenosis. There is a new severe distal left A2 stenosis. No aneurysm is identified. Posterior circulation: The intracranial vertebral arteries are widely patent to the basilar. Patent PICA, AICA, and SCA origins are seen bilaterally. The basilar artery is widely patent. There is a large left posterior communicating artery with absent left P1 segment. Both PCAs are patent without evidence of a significant proximal stenosis. No aneurysm is identified. Venous sinuses: Patent. Anatomic variants: Fetal left PCA. Review of the MIP images confirms the above findings IMPRESSION: 1. No large vessel occlusion or significant proximal stenosis in the head or neck. 2. New severe distal left A2 stenosis. Electronically Signed   By: Sebastian Ache M.D.   On: 05/06/2022 18:17   CT ANGIO NECK W OR WO CONTRAST  Result Date: 05/06/2022 CLINICAL DATA:  Stroke follow-up. EXAM: CT ANGIOGRAPHY HEAD AND NECK TECHNIQUE: Multidetector CT imaging of the head and neck was performed using the standard protocol during bolus administration of intravenous contrast. Multiplanar CT image reconstructions and MIPs were obtained to evaluate the vascular anatomy. Carotid stenosis measurements (when applicable) are obtained utilizing NASCET criteria, using the distal internal carotid diameter as the denominator. RADIATION DOSE REDUCTION: This exam was performed according to the departmental dose-optimization program which includes automated exposure control, adjustment of the mA and/or kV according to patient size and/or use of iterative reconstruction technique.  CONTRAST:  75mL OMNIPAQUE IOHEXOL 350 MG/ML SOLN COMPARISON:  Head and neck CTA 03/21/2020 FINDINGS: CTA NECK FINDINGS Aortic arch: Standard 3 vessel aortic arch. Widely patent arch vessel origins. Right carotid system: Patent and smooth without evidence of stenosis or dissection. Tortuous proximal ICA. Left carotid system: Patent and smooth without evidence of stenosis or dissection. Tortuous mid cervical ICA. Vertebral arteries: Patent, smooth, and codominant without evidence of stenosis or dissection. Skeleton: Widespread dental caries and periapical lucencies. Other neck: No evidence of cervical lymphadenopathy or mass. Upper chest: Clear lung apices. Review of the MIP images confirms the above findings CTA HEAD FINDINGS Anterior circulation: The internal carotid arteries are widely patent from skull base to carotid termini. ACAs and MCAs are patent without evidence of a proximal branch occlusion or significant proximal stenosis. There is a new severe distal left A2 stenosis. No aneurysm is identified. Posterior circulation: The intracranial vertebral  arteries are widely patent to the basilar. Patent PICA, AICA, and SCA origins are seen bilaterally. The basilar artery is widely patent. There is a large left posterior communicating artery with absent left P1 segment. Both PCAs are patent without evidence of a significant proximal stenosis. No aneurysm is identified. Venous sinuses: Patent. Anatomic variants: Fetal left PCA. Review of the MIP images confirms the above findings IMPRESSION: 1. No large vessel occlusion or significant proximal stenosis in the head or neck. 2. New severe distal left A2 stenosis. Electronically Signed   By: Sebastian Ache M.D.   On: 05/06/2022 18:17   ECHOCARDIOGRAM COMPLETE  Result Date: 05/06/2022    ECHOCARDIOGRAM REPORT   Patient Name:   MORNINGSTAR TOFT Date of Exam: 05/06/2022 Medical Rec #:  [id number]          Height:       60.0 in Accession #:    [id number]         Weight:        158.7 lb Date of Birth:  September 14, 1985           BSA:          1.692 m Patient Age:    36 years           BP:           117/77 mmHg Patient Gender: F                  HR:           80 bpm. Exam Location:  Inpatient Procedure: 2D Echo, Cardiac Doppler and Color Doppler Indications:    Stroke  History:        Patient has prior history of Echocardiogram examinations, most                 recent 03/21/2020. Risk Factors:Hypertension.  Sonographer:    Eduard Roux Referring Phys: [id number] Daylene Katayama Tyreck Bell IMPRESSIONS  1. Left ventricular ejection fraction, by estimation, is 60 to 65%. The left ventricle has normal function. The left ventricle has no regional wall motion abnormalities. There is mild concentric left ventricular hypertrophy. Left ventricular diastolic parameters are consistent with Grade II diastolic dysfunction (pseudonormalization).  2. Right ventricular systolic function is normal. The right ventricular size is normal. There is normal pulmonary artery systolic pressure. The estimated right ventricular systolic pressure is 16.2 mmHg.  3. Left atrial size was mildly dilated.  4. The mitral valve is normal in structure. No evidence of mitral valve regurgitation. No evidence of mitral stenosis.  5. The aortic valve is tricuspid. Aortic valve regurgitation is not visualized. No aortic stenosis is present.  6. The inferior vena cava is normal in size with greater than 50% respiratory variability, suggesting right atrial pressure of 3 mmHg. FINDINGS  Left Ventricle: Left ventricular ejection fraction, by estimation, is 60 to 65%. The left ventricle has normal function. The left ventricle has no regional wall motion abnormalities. The left ventricular internal cavity size was normal in size. There is  mild concentric left ventricular hypertrophy. Left ventricular diastolic parameters are consistent with Grade II diastolic dysfunction (pseudonormalization). Right Ventricle: The right ventricular size is normal. No  increase in right ventricular wall thickness. Right ventricular systolic function is normal. There is normal pulmonary artery systolic pressure. The tricuspid regurgitant velocity is 1.82 m/s, and  with an assumed right atrial pressure of 3 mmHg, the estimated right ventricular systolic pressure is 16.2 mmHg. Left Atrium: Left atrial size was  mildly dilated. Right Atrium: Right atrial size was normal in size. Pericardium: Trivial pericardial effusion is present. Mitral Valve: The mitral valve is normal in structure. No evidence of mitral valve regurgitation. No evidence of mitral valve stenosis. Tricuspid Valve: The tricuspid valve is normal in structure. Tricuspid valve regurgitation is trivial. Aortic Valve: The aortic valve is tricuspid. Aortic valve regurgitation is not visualized. No aortic stenosis is present. Aortic valve peak gradient measures 10.4 mmHg. Pulmonic Valve: The pulmonic valve was normal in structure. Pulmonic valve regurgitation is trivial. Aorta: The aortic root is normal in size and structure. Venous: The inferior vena cava is normal in size with greater than 50% respiratory variability, suggesting right atrial pressure of 3 mmHg. IAS/Shunts: No atrial level shunt detected by color flow Doppler.  LEFT VENTRICLE PLAX 2D LVIDd:         4.30 cm   Diastology LVIDs:         2.90 cm   LV e' medial:    5.63 cm/s LV PW:         1.50 cm   LV E/e' medial:  17.8 LV IVS:        1.50 cm   LV e' lateral:   7.51 cm/s LVOT diam:     1.90 cm   LV E/e' lateral: 13.3 LV SV:         75 LV SV Index:   45 LVOT Area:     2.84 cm  RIGHT VENTRICLE             IVC RV Basal diam:  2.90 cm     IVC diam: 1.50 cm RV S prime:     13.00 cm/s TAPSE (M-mode): 2.5 cm LEFT ATRIUM             Index        RIGHT ATRIUM           Index LA diam:        3.10 cm 1.83 cm/m   RA Area:     15.40 cm LA Vol (A2C):   53.7 ml 31.74 ml/m  RA Volume:   38.90 ml  22.99 ml/m LA Vol (A4C):   62.7 ml 37.06 ml/m LA Biplane Vol: 59.3 ml 35.05  ml/m  AORTIC VALVE                 PULMONIC VALVE AV Area (Vmax): 2.40 cm     PV Vmax:       0.75 m/s AV Vmax:        161.00 cm/s  PV Peak grad:  2.2 mmHg AV Peak Grad:   10.4 mmHg LVOT Vmax:      136.00 cm/s LVOT Vmean:     99.100 cm/s LVOT VTI:       0.266 m  AORTA Ao Root diam: 3.40 cm Ao Asc diam:  3.30 cm MITRAL VALVE                TRICUSPID VALVE MV Area (PHT): 3.06 cm     TR Peak grad:   13.2 mmHg MV Decel Time: 248 msec     TR Vmax:        182.00 cm/s MV E velocity: 100.00 cm/s MV A velocity: 87.50 cm/s   SHUNTS MV E/A ratio:  1.14         Systemic VTI:  0.27 m  Systemic Diam: 1.90 cm Dalton McleanMD Electronically signed by Wilfred Lacy Signature Date/Time: 05/06/2022/5:45:29 PM    Final    MR BRAIN WO CONTRAST  Result Date: 05/06/2022 CLINICAL DATA:  Acute neuro deficit. EXAM: MRI HEAD WITHOUT CONTRAST TECHNIQUE: Multiplanar, multiecho pulse sequences of the brain and surrounding structures were obtained without intravenous contrast. COMPARISON:  CT head 05/06/2022.  MRI head 03/20/2020 FINDINGS: Brain: Multiple areas of restricted diffusion compatible with acute infarct. These are all small areas of infarct involving the right pons, left putamen, right thalamus, corpus callosum, and the left frontal white matter. Chronic infarcts in the pons especially on the right. Chronic lacunar infarcts in the thalamus bilaterally. Chronic infarcts in the basal ganglia bilaterally. Multiple foci of chronic hemorrhage involving the pons. Chronic microhemorrhage in the right thalamus and right putamen. No hydrocephalus or mass lesion. Vascular: Normal arterial flow voids Skull and upper cervical spine: Negative Sinuses/Orbits: Mild mucosal edema paranasal sinuses. Negative orbit Other: None IMPRESSION: Numerous small areas of acute infarct as described above. Correlate with recent history of hypertension or hypotension. Cerebral emboli possible however no cortical infarct is seen as  would be expected with emboli. Advanced for age chronic microvascular ischemia and multiple areas of chronic microhemorrhage. Correlate with risk factors for small vessel disease. Electronically Signed   By: Marlan Palau M.D.   On: 05/06/2022 14:51     Pamella Pert, MD, PhD [facility]  Between 7 am - 7 pm I am available, please contact me via Amion (for emergencies) or Securechat (non urgent messages)  Between 7 pm - 7 am I am not available, please contact night coverage MD/APP via Amion

## 2022-05-07 NOTE — Progress Notes (Signed)
Patient c/o severe mid-lower back pain and R facial pain this morning. Dr. Reece Agar informed and new orders placed. Patient was given tramadol per PRN pain orders. Patient's mother at bedside. Patient reports a fall yesterday "in the shower on the way to the toilet". Mother states that patient did not fall, she sat down abruptly on the toilet, with nursing staff. There is no bruising or external injury apparent on patient's back or face. Will follow-up with patient's pain level and continue to monitor closely.

## 2022-05-07 NOTE — Consult Note (Signed)
Neurology Consultation  Reason for Consult: Stroke, right side weakness Referring Physician: Elvera Lennox  CC: Stroke on MRI  History is obtained from: patient  HPI: [name] is a 36 y.o. female medical history significant for a prior CVAs, uncontrolled hypertension at home, heavy alcohol use, cocaine use one month ago, admitted with abdominal pain and fever with concern for possible early abscess or inflammation of the pelvis.  She is currently on antibiotics and general surgery, GYN, and GI have been consulted.  Hospitalist noted that she had less movement on her right side however this was fluctuating and she was able to ambulate intermittently.   Hospitalist thus ordered an MRI brain.  Neurology review of these images showed numerous small areas of acute infarct in the right pons, left putamen, right thalamus, corpus callosum, and left frontal white matter.  She currently is not having any weakness specifically on her right side, however feeling weak generally and she is fatigued easily.  She is currently reporting abdominal pain, but is not reporting any dizziness, headache, or focal neurological deficits.  LKW: 9/12 tpa given?: no, outside of window Premorbid modified Rankin scale (mRS):  1-No significant post stroke disability and can perform usual duties with stroke symptoms  NIHSS components Score: Comment  1a Level of Conscious 0[x]  1[]  2[]  3[]         1b LOC Questions 0[x]  1[]  2[]           1c LOC Commands 0[x]  1[]  2[]           2 Best Gaze 0[x]  1[]  2[]           3 Visual 0[x]  1[]  2[]  3[]         4 Facial Palsy 0[x]  1[]  2[]  3[]         5a Motor Arm - left 0[x]  1[]  2[]  3[]  4[]  UN[]     5b Motor Arm - Right 0[x]  1[]  2[]  3[]  4[]  UN[]     6a Motor Leg - Left 0[x]  1[]  2[]  3[]  4[]  UN[]     6b Motor Leg - Right 0[x]  1[]  2[]  3[]  4[]  UN[]     7 Limb Ataxia 0[x]  1[]  2[]  3[]  UN[]       8 Sensory 0[x]  1[]  2[]  UN[]         9 Best Language 0[x]  1[]  2[]  3[]         10 Dysarthria 0[x]  1[]  2[]  UN[]          11 Extinct. and Inattention 0[x]  1[]  2[]           TOTAL: 0        ROS: Full ROS was performed and is negative except as noted in the HPI.   Past Medical History:  Diagnosis Date   Anxiety    Cirrhosis of liver (HCC)    Cocaine use 08/04/2018   History of cocaine use 08/04/2018   Hypertension    Polysubstance abuse (HCC)    Renal disorder    Kidney Infection     Family History  Problem Relation Age of Onset   CAD Other    Diabetes Other    Hypertension Other    Cancer Other    Thyroid disease Other     Social History:   reports that she has been smoking cigarettes. She has been smoking an average of .5 packs per day. She has never used smokeless tobacco. She reports current alcohol use. She reports that she does not currently use drugs after having used the following drugs: Cocaine.  Medications  Current Facility-Administered Medications:  stroke: early stages of recovery book, , Does not apply, Once, Leatha Gilding, MD   acetaminophen (TYLENOL) tablet 650 mg, 650 mg, Oral, Q6H PRN, Opyd, Lavone Neri, MD, 650 mg at 05/05/22 0456   aspirin EC tablet 81 mg, 81 mg, Oral, Daily, Leatha Gilding, MD, 81 mg at 05/07/22 6160   chlordiazePOXIDE (LIBRIUM) capsule 5 mg, 5 mg, Oral, TID, Leatha Gilding, MD, 5 mg at 05/07/22 7371   Chlorhexidine Gluconate Cloth 2 % PADS 6 each, 6 each, Topical, Daily, Bobette Mo, MD, 6 each at 05/06/22 0941   ciprofloxacin (CIPRO) IVPB 400 mg, 400 mg, Intravenous, BID, Herby Abraham, RPH, Last Rate: 200 mL/hr at 05/07/22 0930, 400 mg at 05/07/22 0930   docusate sodium (COLACE) capsule 100 mg, 100 mg, Oral, BID, Noralee Stain, DO, 100 mg at 05/07/22 0626   doxycycline (VIBRA-TABS) tablet 100 mg, 100 mg, Oral, Q12H, Noralee Stain, DO, 100 mg at 05/07/22 9485   folic acid (FOLVITE) tablet 1 mg, 1 mg, Oral, Daily, Bobette Mo, MD, 1 mg at 05/07/22 4627   HYDROmorphone (DILAUDID) injection 1 mg, 1 mg, Intravenous, Q4H  PRN, Leatha Gilding, MD, 1 mg at 05/07/22 1051   hydrOXYzine (ATARAX) tablet 25 mg, 25 mg, Oral, TID PRN, Noralee Stain, DO, 25 mg at 05/05/22 1516   labetalol (NORMODYNE) injection 10 mg, 10 mg, Intravenous, Q2H PRN, Leatha Gilding, MD   metoCLOPramide (REGLAN) injection 10 mg, 10 mg, Intravenous, Q6H, Bobette Mo, MD, 10 mg at 05/07/22 0536   metroNIDAZOLE (FLAGYL) IVPB 500 mg, 500 mg, Intravenous, Q12H, Noralee Stain, DO, Last Rate: 100 mL/hr at 05/07/22 0929, 500 mg at 05/07/22 0929   multivitamin with minerals tablet 1 tablet, 1 tablet, Oral, Daily, Bobette Mo, MD, 1 tablet at 05/07/22 0350   nicotine (NICODERM CQ - dosed in mg/24 hours) patch 21 mg, 21 mg, Transdermal, Daily, Opyd, Lavone Neri, MD, 21 mg at 05/07/22 0920   ondansetron (ZOFRAN) tablet 4 mg, 4 mg, Oral, Q6H PRN **OR** ondansetron (ZOFRAN) injection 4 mg, 4 mg, Intravenous, Q6H PRN, Bobette Mo, MD   Oral care mouth rinse, 15 mL, Mouth Rinse, PRN, Bobette Mo, MD   pantoprazole (PROTONIX) EC tablet 40 mg, 40 mg, Oral, BID AC, Leatha Gilding, MD, 40 mg at 05/07/22 0917   polyethylene glycol (MIRALAX / GLYCOLAX) packet 17 g, 17 g, Oral, Daily PRN, Opyd, Lavone Neri, MD, 17 g at 05/02/22 0938   thiamine (VITAMIN B1) tablet 100 mg, 100 mg, Oral, Daily, 100 mg at 05/07/22 0917 **OR** thiamine (VITAMIN B1) injection 100 mg, 100 mg, Intravenous, Daily, Bobette Mo, MD   traMADol Janean Sark) tablet 50 mg, 50 mg, Oral, Q12H PRN, Noralee Stain, DO, 50 mg at 05/07/22 1829   Exam: Current vital signs: BP 132/84 (BP Location: Left Arm)   Pulse 81   Temp 98.8 F (37.1 C) (Oral)   Resp 17   Ht 5' (1.524 m)   Wt 72 kg   LMP 05/01/2022 (Approximate) Comment: negative hcg blood test 05-01-2022  SpO2 97%   BMI 31.00 kg/m  Vital signs in last 24 hours: Temp:  [97.5 F (36.4 C)-98.8 F (37.1 C)] 98.8 F (37.1 C) (09/14 0947) Pulse Rate:  [70-89] 81 (09/14 0947) Resp:  [14-18] 17 (09/14  0947) BP: (111-135)/(59-84) 132/84 (09/14 0947) SpO2:  [97 %-99 %] 97 % (09/14 0947)  GENERAL: Drowsy, awakens to voice HEENT: - Normocephalic and atraumatic, dry mm, no LN++,  no Thyromegally LUNGS - Clear to auscultation bilaterally with no wheezes CV - S1S2 RRR, no m/r/g, equal pulses bilaterally. ABDOMEN - Soft, tender to palpation Ext: warm, well perfused, intact peripheral pulses, no edema  NEURO:  Mental Status: Drowsy but oriented x3 Language: speech is soft but clear.  Naming, repetition, fluency, and comprehension intact. Cranial Nerves: PERRL 3 mm/brisk. EOMI, visual fields full, no facial asymmetry, facial sensation intact, hearing intact, tongue/uvula/soft palate midline, normal sternocleidomastoid and trapezius muscle strength. No evidence of tongue atrophy or fibrillations Motor: 4/5 strength in all extremities, no drift noted, generalized weakness Tone: is normal and bulk is normal Sensation- Intact to light touch bilaterally Coordination: FTN intact bilaterally, movements are slow due to fatigue, no ataxia in BLE. Gait- deferred  Labs I have reviewed labs in epic and the results pertinent to this consultation are:  CBC    Component Value Date/Time   WBC 5.5 05/07/2022 0528   RBC 3.74 (L) 05/07/2022 0528   HGB 11.2 (L) 05/07/2022 0528   HCT 36.5 05/07/2022 0528   PLT 283 05/07/2022 0528   MCV 97.6 05/07/2022 0528   MCH 29.9 05/07/2022 0528   MCHC 30.7 05/07/2022 0528   RDW 15.7 (H) 05/07/2022 0528   LYMPHSABS 2.3 03/20/2020 2336   MONOABS 0.6 03/20/2020 2336   EOSABS 0.3 03/20/2020 2336   BASOSABS 0.1 03/20/2020 2336    CMP     Component Value Date/Time   NA 135 05/07/2022 0528   K 3.6 05/07/2022 0528   CL 105 05/07/2022 0528   CO2 22 05/07/2022 0528   GLUCOSE 91 05/07/2022 0528   BUN 12 05/07/2022 0528   CREATININE 0.60 05/07/2022 0528   CREATININE 0.69 09/23/2016 1442   CALCIUM 8.7 (L) 05/07/2022 0528   PROT 6.2 (L) 05/07/2022 0528   ALBUMIN 2.7  (L) 05/07/2022 0528   AST 10 (L) 05/07/2022 0528   ALT 11 05/07/2022 0528   ALKPHOS 71 05/07/2022 0528   BILITOT 0.7 05/07/2022 0528   GFRNONAA >60 05/07/2022 0528   GFRNONAA >89 09/23/2016 1442   GFRAA >60 05/09/2020 0524   GFRAA >89 09/23/2016 1442    Lipid Panel     Component Value Date/Time   CHOL 129 05/07/2022 0528   TRIG 112 05/07/2022 0528   HDL 23 (L) 05/07/2022 0528   CHOLHDL 5.6 05/07/2022 0528   VLDL 22 05/07/2022 0528   LDLCALC 84 05/07/2022 0528     Imaging I have reviewed the images obtained:  CT-head- new hypodensity in the left caudate head. Although this has a chronic appearance, it was not visualized on prior imaging and is technically age indeterminate. If there is clinical concern for an acute infarct, this could be further assessed with a brain MRI.  CTA Head and Neck- No LVO, new severe distal left A2 stenosis  MRI examination of the brain Multiple areas of restricted diffusion compatible with acute infarct. These are all small areas of infarct involving the right pons, left putamen, right thalamus, corpus callosum, and the left frontal white matter. Correlate with recent history of hypertension or hypotension. Cerebral emboli possible however no cortical infarct is seen as would be expected with emboli.  2D Echo- EF 60-65%, bubble neg  Assessment:  Watershed pattern stroke in the setting of relative hypotension.  Embolic etiology is also a possibility however she has no cortical infarcts which she would typically see in the setting of scattered emboli.  She did present with hypertensive emergency this admission her blood pressure was  brought down to a systolic of 114 within the last 24 hours.  Recommendations: - Permissive HTN x48 hrs goal BP <180/105. PRN labetalol or hydralazine if BP above these parameters. Avoid oral antihypertensives. Strict avoidance of even relative hypertension, maintain SBP > 135 - Add 40mg  atorvastatin daily for LDL above  goal - ASA 325mg  now f/b 81mg  daily. She has a listed allergy to ibuprofen but patient states she has tolerated aspirin in the past. Will hold on plavix for now until decision is made on whether or not patient will need surgery for pelvic abscess. When patient is cleared by surgery, please add plavix 75mg  daily. She should remain on DAPT x90 days in the setting of severe intracranial stenosis, after which she can be transitioned to ASA 81mg  daily monotherapy only. - q4 hr neuro checks - STAT head CT for any change in neuro exam - Tele - PT/OT/SLP - Stroke education - Amb referral to neurology upon discharge - I will arrange outpatient neurology f/u  Patient seen and examined by NP/APP with MD. MD to update note as needed.   , DNP, FNP-BC [facility] Pager: [phone]  Attending Neurologist's note:   I personally saw this patient, gathering history, performing a full neurologic examination, reviewing relevant labs, personally reviewing relevant imaging, and formulated the assessment and plan, adding the note above for completeness and clarity to accurately reflect my findings and recommendations. Stroke workup now completed. Neurology to sign off, but please re-engage if additional neurologic concerns arise.  , MD [facility] [phone]  If 7pm- 7am, please page neurology on call as listed in AMION.

## 2022-05-07 NOTE — Progress Notes (Signed)
[facility] Progress Note  [name] 36 y.o. [birth year]    Subjective: Seen and examined sitting in a chair, she is visibly uncomfortable and asking for pain medications. She is not drinking much of her diet due to pain. Neurology consulted for possible stroke.   ROS : Review of Systems  Gastrointestinal:  Positive for abdominal pain. Negative for blood in stool, constipation, diarrhea, heartburn, melena, nausea and vomiting.  Genitourinary:  Negative for dysuria and urgency.      Objective: Vital signs in last 24 hours: Vitals:   05/07/22 0947 05/07/22 1323  BP: 132/84 130/82  Pulse: 81 81  Resp: 17 17  Temp: 98.8 F (37.1 C) 98.4 F (36.9 C)  SpO2: 97% 97%    Physical Exam:  General:  Alert, cooperative, no distress, appears stated age  Head:  Normocephalic, without obvious abnormality, atraumatic  Eyes:  Anicteric sclera, EOM's intact  Lungs:   Clear to auscultation bilaterally, respirations unlabored  Heart:  Regular rate and rhythm, S1, S2 normal  Abdomen:   Soft, mild lower abdominal tenderness, bowel sounds active all four quadrants,  no masses,   Extremities: Extremities normal, atraumatic, no  edema  Pulses: 2+ and symmetric    Lab Results: Recent Labs    05/06/22 0456 05/07/22 0528  NA 136 135  K 4.1 3.6  CL 106 105  CO2 21* 22  GLUCOSE 98 91  BUN 15 12  CREATININE 0.69 0.60  CALCIUM 8.8* 8.7*  MG  --  1.8  PHOS  --  4.0   Recent Labs    05/07/22 0528  AST 10*  ALT 11  ALKPHOS 71  BILITOT 0.7  PROT 6.2*  ALBUMIN 2.7*   Recent Labs    05/06/22 0456 05/07/22 0528  WBC 6.9 5.5  HGB 11.3* 11.2*  HCT 35.9* 36.5  MCV 97.6 97.6  PLT 209 283   No results for input(s): "LABPROT", "INR" in the last 72 hours.    Assessment Abdominal pain Reported family history of Crohn's disease Pelvic abscess   With intermittent lower abdominal pain for several years now presenting to the hospital with severe constant bilateral  lower abdominal pain.  She has been intermittently febrile during admission temp of 103.1 on 05/03/2022, he has been afebrile for the last 24 hours.  She is currently on metronidazole 500 mg twice daily and Cipro 400 mg twice daily for intra-abdominal infection.  OBGYN consult for possible PID, suggested adding doxycycline for PID coverage. WBC is not elevated today.  CT angiogram with findings concerning for pelvic abscess.  Evaluation by interventional radiology with no recommended areas to drain at this time.  CT angio also showed possible source of inflammation within the small bowel.  Patient with significant reported family history of Crohn's disease.  Findings concerning for possible Crohn's disease.   CT angio abdomen pelvis with and without contrast 05/04/2022 1. New fluid and inflammatory changes in the lower abdomen and pelvis. These inflammatory changes are near the uterus, adnexa and loops of bowel. Inflammation source is unclear. Differential diagnosis includes enteritis, colitis and possibly pelvic inflammatory disease. Dilatation of some small bowel loops suggests that small bowel could be the inflammatory source. 2. New small complex fluid collections in the pelvis. Early or developing small abscess collections cannot be excluded. 3. Slightly enlarged retroperitoneal lymph nodes as described. Findings are nonspecific but could be reactive. 4. Mild wall thickening in the urinary bladder is nonspecific due to incomplete distension and could be also  be related to surrounding inflammatory changes.   Plan: Continue pain control and antiemetics as needed. Continue IV antibiotics. Consider repeat CT in the next 1 to 2 days for further evaluation of possible pelvic abscess. Continue clear liquid diet as tolerated. Recommended alcohol, smoking, and Goody powder use cessation Patient may need evaluation in the future with colonoscopy for further evaluation of Crohn's disease once infection has been  managed. [facility] will follow.  Angela Reno Zenab Gronewold PA-C 05/07/2022, 1:57 PM  Contact #  [phone]

## 2022-05-07 NOTE — Evaluation (Signed)
Physical Therapy Evaluation Patient Details Name: [name] MRN: [id number] DOB: October 16, 1985 Today's Date: 05/07/2022  History of Present Illness  36 year old female who presents with 1 week of progressively worsening right lower quadrant abdominal pain, fever, vomiting.  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  CTA abdomen performed 9/11 shows development of new fluid and inflammatory changes in the lower abdomen and pelvis. Pt also found to have acute multiple punctate CVAs:  05/06/22 MRI of the brain showed multiple areas of small infarcts involving the right pons, left putamen, right thalamus, corpus callosum and left frontal white matter.  PMHx: anxiety, polysubstance abuse, liver cirrhosis, hypertension, history of other nonhemorrhagic CVA, ventral hernia, constipation  Clinical Impression  Pt admitted with above diagnosis.  Pt currently with functional limitations due to the deficits listed below (see PT Problem List). Pt will benefit from skilled PT to increase their independence and safety with mobility to allow discharge to the venue listed below.   Pt given pain meds on arrival to room. Pt assisted with ambulating in hallway and requiring mod assist at times for stability, control, coordination with RW.  Pt presents with right sided weakness, poor safety awareness, decreased mobility, and impaired balance at this time.  Pt is HIGH risk for falls.        Recommendations for follow up therapy are one component of a multi-disciplinary discharge planning process, led by the attending physician.  Recommendations may be updated based on patient status, additional functional criteria and insurance authorization.  Follow Up Recommendations Acute inpatient rehab (3hours/day)      Assistance Recommended at Discharge Frequent or constant Supervision/Assistance  Patient can return home with the following  A little help with  walking and/or transfers;A little help with bathing/dressing/bathroom;Help with stairs or ramp for entrance    Equipment Recommendations Rolling walker (2 wheels)  Recommendations for Other Services       Functional Status Assessment Patient has had a recent decline in their functional status and demonstrates the ability to make significant improvements in function in a reasonable and predictable amount of time.     Precautions / Restrictions Precautions Precautions: Fall      Mobility  Bed Mobility Overal bed mobility: Needs Assistance Bed Mobility: Supine to Sit, Sit to Supine     Supine to sit: Min assist Sit to supine: Supervision   General bed mobility comments: pt requesting assist for bringing trunk upright    Transfers Overall transfer level: Needs assistance Equipment used: Rolling walker (2 wheels) Transfers: Sit to/from Stand Sit to Stand: Min assist           General transfer comment: cues for hand placement, decreased control and coordination observed throughout mobilization, assist for rise and stabilizing    Ambulation/Gait Ambulation/Gait assistance: Mod assist Gait Distance (Feet): 400 Feet Assistive device: Rolling walker (2 wheels) Gait Pattern/deviations: Step-through pattern, Decreased stride length, Drifts right/left, Trunk flexed       General Gait Details: decreased control of movement with pt pitching forward with increasing speed, requiring assist to correct and keep RW with pt, assist also for stabilizing, leaning heavily to right side upon last 100 feet requiring assist  Stairs            Wheelchair Mobility    Modified Rankin (Stroke Patients Only) Modified Rankin (Stroke Patients Only) Pre-Morbid Rankin Score: No symptoms Modified Rankin: Moderately severe disability     Balance Overall balance assessment: Needs  assistance         Standing balance support: Bilateral upper extremity supported, Reliant on assistive  device for balance Standing balance-Leahy Scale: Poor Standing balance comment: static poor, dynamic zero                             Pertinent Vitals/Pain Pain Assessment Pain Assessment: 0-10 Pain Score: 9  Pain Location: abdomen Pain Descriptors / Indicators: Aching, Discomfort Pain Intervention(s): RN gave pain meds during session, Repositioned, Monitored during session (asking for pain meds end of session even though RN gave IV pain med at beginning of session)    Home Living Family/patient expects to be discharged to:: Private residence Living Arrangements: Non-relatives/Friends   Type of Home: Mobile home Home Access: Stairs to enter Entrance Stairs-Rails: Right Entrance Stairs-Number of Steps: 6   Home Layout: One level Home Equipment: None Additional Comments: reports her mother can assist her upon d/c    Prior Function Prior Level of Function : Independent/Modified Independent                     Hand Dominance        Extremity/Trunk Assessment        Lower Extremity Assessment Lower Extremity Assessment: RLE deficits/detail;Generalized weakness RLE Deficits / Details: slightly weaker then Lt LE, pt also reports increased right sided weakness RLE Coordination: decreased fine motor;decreased gross motor       Communication   Communication: No difficulties  Cognition Arousal/Alertness: Awake/alert Behavior During Therapy: Flat affect Overall Cognitive Status: Impaired/Different from baseline                                 General Comments: family not present, pt very flat and speech difficulty to understand at times, short term memory impaired?        General Comments      Exercises     Assessment/Plan    PT Assessment Patient needs continued PT services  PT Problem List Decreased strength;Decreased coordination;Decreased balance;Decreased mobility;Decreased knowledge of use of DME;Decreased safety  awareness       PT Treatment Interventions DME instruction;Gait training;Therapeutic exercise;Functional mobility training;Patient/family education;Therapeutic activities;Balance training;Neuromuscular re-education    PT Goals (Current goals can be found in the Care Plan section)  Acute Rehab PT Goals PT Goal Formulation: With patient Time For Goal Achievement: 05/21/22 Potential to Achieve Goals: Good    Frequency Min 4X/week     Co-evaluation               AM-PAC PT "6 Clicks" Mobility  Outcome Measure Help needed turning from your back to your side while in a flat bed without using bedrails?: A Little Help needed moving from lying on your back to sitting on the side of a flat bed without using bedrails?: A Little Help needed moving to and from a bed to a chair (including a wheelchair)?: A Little Help needed standing up from a chair using your arms (e.g., wheelchair or bedside chair)?: A Lot Help needed to walk in hospital room?: A Lot Help needed climbing 3-5 steps with a railing? : A Lot 6 Click Score: 15    End of Session Equipment Utilized During Treatment: Gait belt Activity Tolerance: Patient tolerated treatment well Patient left: in bed;with call bell/phone within reach Nurse Communication: Mobility status PT Visit Diagnosis: Other abnormalities of gait and mobility (R26.89)  Time: 4481-8563 PT Time Calculation (min) (ACUTE ONLY): 19 min   Charges:   PT Evaluation $PT Eval Moderate Complexity: 1 Mod        Kati PT, DPT Physical Therapist Acute Rehabilitation Services Preferred contact method: Secure Chat Weekend Pager Only: [phone] Office: [phone]   [name] 05/07/2022, 2:27 PM

## 2022-05-08 DIAGNOSIS — I639 Cerebral infarction, unspecified: Secondary | ICD-10-CM

## 2022-05-08 LAB — CULTURE, BLOOD (ROUTINE X 2)
Culture: NO GROWTH
Culture: NO GROWTH
Special Requests: ADEQUATE

## 2022-05-08 LAB — COMPREHENSIVE METABOLIC PANEL
ALT: 12 U/L (ref 0–44)
AST: 12 U/L — ABNORMAL LOW (ref 15–41)
Albumin: 2.5 g/dL — ABNORMAL LOW (ref 3.5–5.0)
Alkaline Phosphatase: 76 U/L (ref 38–126)
Anion gap: 7 (ref 5–15)
BUN: 10 mg/dL (ref 6–20)
CO2: 25 mmol/L (ref 22–32)
Calcium: 8.7 mg/dL — ABNORMAL LOW (ref 8.9–10.3)
Chloride: 106 mmol/L (ref 98–111)
Creatinine, Ser: 0.5 mg/dL (ref 0.44–1.00)
GFR, Estimated: >60 mL/min (ref >60)
Glucose, Bld: 101 mg/dL — ABNORMAL HIGH (ref 70–99)
Potassium: 3.4 mmol/L — ABNORMAL LOW (ref 3.5–5.1)
Sodium: 138 mmol/L (ref 135–145)
Total Bilirubin: 0.4 mg/dL (ref 0.3–1.2)
Total Protein: 6.2 g/dL — ABNORMAL LOW (ref 6.5–8.1)

## 2022-05-08 LAB — CBC
HCT: 31.8 % — ABNORMAL LOW (ref 36.0–46.0)
Hemoglobin: 10.2 g/dL — ABNORMAL LOW (ref 12.0–15.0)
MCH: 30.4 pg (ref 26.0–34.0)
MCHC: 32.1 g/dL (ref 30.0–36.0)
MCV: 94.6 fL (ref 80.0–100.0)
Platelets: 327 10*3/uL (ref 150–400)
RBC: 3.36 MIL/uL — ABNORMAL LOW (ref 3.87–5.11)
RDW: 15.4 % (ref 11.5–15.5)
WBC: 5.6 10*3/uL (ref 4.0–10.5)
nRBC: 0 % (ref 0.0–0.2)

## 2022-05-08 LAB — MAGNESIUM: Magnesium: 1.8 mg/dL (ref 1.7–2.4)

## 2022-05-08 MED ORDER — POTASSIUM CHLORIDE CRYS ER 20 MEQ PO TBCR
40.0000 meq | EXTENDED_RELEASE_TABLET | Freq: Once | ORAL | Status: AC
  Administered 2022-05-08: 40 meq via ORAL
  Filled 2022-05-08: qty 2

## 2022-05-08 NOTE — Evaluation (Signed)
Occupational Therapy Evaluation Patient Details Name: [name] MRN: [id number] DOB: 1986-05-08 Today's Date: 05/08/2022   History of Present Illness 36 year old female who presents with 1 week of progressively worsening right lower quadrant abdominal pain, fever, vomiting.  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  CTA abdomen performed 9/11 shows development of new fluid and inflammatory changes in the lower abdomen and pelvis. Pt also found to have acute multiple punctate CVAs:  05/06/22 MRI of the brain showed multiple areas of small infarcts involving the right pons, left putamen, right thalamus, corpus callosum and left frontal white matter.  PMHx: anxiety, polysubstance abuse, liver cirrhosis, hypertension, history of other nonhemorrhagic CVA, ventral hernia, constipation   Clinical Impression   The pt was found in bed, she was oriented x4, and she agreed to participate in the evaluation process. Her mother was also present throughout. The pt presented lethargy, however not severe, and she reported having "11"/10 lower abdominal pain. She required min assist for supine to sit, min guard assist to stand using a RW, min assist for toileting at bathroom level, and min guard assist for grooming standing at the sink. She reported having acute vision changes of her R eye, described as having occasional "swirls" in her visual field. She further appeared to have slight R LE weakness, mildly impaired R UE fine motor coordination, occasional R sided lean in sitting, and impaired standing balance. Without further OT services, she is at risk for falls and restricted participation in meaningful activities.      Recommendations for follow up therapy are one component of a multi-disciplinary discharge planning process, led by the attending physician.  Recommendations may be updated based on patient status, additional functional criteria and  insurance authorization.   Follow Up Recommendations  Acute inpatient rehab (3hours/day) (vs. Home Health Therapy & Family Assist, pending functional progress)    Assistance Recommended at Discharge Intermittent Supervision/Assistance  Patient can return home with the following Help with stairs or ramp for entrance;A little help with bathing/dressing/bathroom;Assistance with cooking/housework    Functional Status Assessment  Patient has had a recent decline in their functional status and demonstrates the ability to make significant improvements in function in a reasonable and predictable amount of time.  Equipment Recommendations  Tub/shower bench       Precautions / Restrictions Precautions Precautions: Fall Restrictions Weight Bearing Restrictions: No      Mobility Bed Mobility Overal bed mobility: Needs Assistance Bed Mobility: Supine to Sit, Sit to Supine     Supine to sit: Min assist Sit to supine: Supervision        Transfers Overall transfer level: Needs assistance Equipment used: Rolling walker (2 wheels) Transfers: Sit to/from Stand Sit to Stand: Min guard          Balance       Sitting balance - Comments: Able to maintain sitting without the need for assist, however slight R sided lean observed       Standing balance comment: static standing-min guard. dynamic standing-min assist             ADL either performed or assessed with clinical judgement   ADL Overall ADL's : Needs assistance/impaired Eating/Feeding: Independent Eating/Feeding Details (indicate cue type and reason): based on clinical judgement Grooming: Min guard Grooming Details (indicate cue type and reason): She required occasional steadying assist to perform hand washing standing at the sink.  Upper Body Dressing : Set up   Lower Body Dressing: Minimal assistance   Toilet Transfer: Min guard                   Vision   Vision Assessment?: Vision impaired-  to be further tested in functional context Additional Comments: She reported having acute vision changes of her R eye, described as having intermittent "swirls" in her visual field.     Perception  Left/right discrimination intact        Pertinent Vitals/Pain Pain Assessment Pain Assessment: 0-10 ("11") Pain Location: abdomen Pain Intervention(s): RN gave pain meds during session     Hand Dominance  (She reported being ambidextrous.)   Extremity/Trunk Assessment Upper Extremity Assessment Upper Extremity Assessment: Overall WFL for tasks assessed (B UE grip strength appeared symmetrical with no significant weakness. B UE AROM WFL)   Lower Extremity Assessment Lower Extremity Assessment: Overall WFL for tasks assessed (B LE AROM WFL. Slight R LE weakness noted through observation) RLE Coordination:  (L UE digit opposition WNL. R UE digit opposition mildly impaired)       Communication Communication Communication: No difficulties   Cognition Arousal/Alertness: Lethargic Behavior During Therapy: Flat affect        General Comments: Oriented x4, able to follow commands consistently         Home Living Family/patient expects to be discharged to:: Private residence Living Arrangements: Non-relatives/Friend   Type of Home: Mobile home Home Access: Stairs to enter Secretary/administrator of Steps: 4 Entrance Stairs-Rails: None Home Layout: One level     Bathroom Shower/Tub: Tub/shower unit         Home Equipment: None          Prior Functioning/Environment Prior Level of Function : Independent/Modified Independent               ADLs Comments: She was independent with ADLs, ambulation, cooking, and cleaning.        OT Problem List: Decreased strength;Impaired balance (sitting and/or standing);Impaired vision/perception;Decreased coordination;Decreased safety awareness;Decreased knowledge of use of DME or AE;Pain      OT Treatment/Interventions:  Self-care/ADL training;Therapeutic exercise;Neuromuscular education;Energy conservation;DME and/or AE instruction;Therapeutic activities;Cognitive remediation/compensation;Visual/perceptual remediation/compensation;Patient/family education;Balance training    OT Goals(Current goals can be found in the care plan section) Acute Rehab OT Goals Patient Stated Goal: She did not specifically state OT Goal Formulation: With patient Time For Goal Achievement: 05/22/22 Potential to Achieve Goals: Good  OT Frequency: Min 2X/week       AM-PAC OT "6 Clicks" Daily Activity     Outcome Measure Help from another person eating meals?: None Help from another person taking care of personal grooming?: A Little Help from another person toileting, which includes using toliet, bedpan, or urinal?: A Little Help from another person bathing (including washing, rinsing, drying)?: A Little Help from another person to put on and taking off regular upper body clothing?: A Little Help from another person to put on and taking off regular lower body clothing?: A Little 6 Click Score: 19   End of Session Equipment Utilized During Treatment: Gait belt;Rolling walker (2 wheels) Nurse Communication: Patient requests pain meds  Activity Tolerance: Patient limited by lethargy Patient left: in bed;with call bell/phone within reach;with bed alarm set;with family/visitor present  OT Visit Diagnosis: Unsteadiness on feet (R26.81);Muscle weakness (generalized) (M62.81)                Time: 4627-0350 OT Time Calculation (min): 34 min Charges:  OT General Charges $  OT Visit: 1 Visit OT Evaluation $OT Eval Moderate Complexity: 1 Mod OT Treatments $Self Care/Home Management : 8-22 mins  Reuben Likes, OTR/L 05/08/2022, 11:18 AM

## 2022-05-08 NOTE — TOC Initial Note (Addendum)
Transition of Care [facility]) - Initial/Assessment Note    Patient Details  Name: [name] MRN: [id number] Date of Birth: 06-10-1986  [facility]) CM/SW Contact:    Coralyn Helling, LCSW Phone Number: 05/08/2022, 11:51 AM  Clinical Narrative:       Patient from home where she lives with friends. Patient reports she has 4 stairs to enter her home. Patient reports being independent in all ADLs at baseline. Patient relies on the bus for transportation, but closest stop is a mile away from her home. She reports she is unemployed and cannot afford her medications. Patient reports she was denied for medicaid earlier this year. Patient may need MATCH at dc. PCP appointment made for October 5 at clinic and added to AVS. Previous appointment for January canceled.   Will continue to follow for dc needs.             Expected Discharge Plan: Home/Self Care Barriers to Discharge: Continued Medical Work up   Patient Goals and CMS Choice Patient states their goals for this hospitalization and ongoing recovery are:: Get better   Choice offered to / list presented to : NA  Expected Discharge Plan and Services Expected Discharge Plan: Home/Self Care   Discharge Planning Services: [facility]   Living arrangements for the past 2 months: Mobile Home                                      Prior Living Arrangements/Services Living arrangements for the past 2 months: Mobile Home Lives with:: Friends Patient language and need for interpreter reviewed:: Yes Do you feel safe going back to the place where you live?: Yes      Need for Family Participation in Patient Care: No (Comment) Care giver support system in place?: No (comment)   Criminal Activity/Legal Involvement Pertinent to Current Situation/Hospitalization: No - Comment as needed  Activities of Daily Living Home Assistive Devices/Equipment: None ADL Screening (condition at time of admission) Patient's  cognitive ability adequate to safely complete daily activities?: Yes Is the patient deaf or have difficulty hearing?: No Does the patient have difficulty seeing, even when wearing glasses/contacts?: No (trouble seeing when blood pressure is high) Does the patient have difficulty concentrating, remembering, or making decisions?: Yes (" a little bit") Patient able to express need for assistance with ADLs?: Yes Does the patient have difficulty dressing or bathing?: No Independently performs ADLs?: Yes (appropriate for developmental age) Does the patient have difficulty walking or climbing stairs?: No Weakness of Legs: Both Weakness of Arms/Hands: Both  Permission Sought/Granted   Permission granted to share information with : No              Emotional Assessment Appearance:: Appears older than stated age Attitude/Demeanor/Rapport: Lethargic Affect (typically observed): Accepting Orientation: : Oriented to Self, Oriented to Place, Oriented to  Time, Oriented to Situation Alcohol / Substance Use: Alcohol Use, Illicit Drugs Psych Involvement: No (comment)  Admission diagnosis:  Hypertensive crisis [I16.9] Hypertensive emergency without congestive heart failure [I16.1] Hypertensive urgency [I16.0] Patient Active Problem List   Diagnosis Date Noted   Alcohol dependence (HCC) 05/02/2022   Hypokalemia 05/02/2022   Hepatic steatosis 05/01/2022   Abdominal pain 05/01/2022   Nausea and vomiting 05/01/2022   Cocaine abuse (HCC) 05/01/2022   Acute CVA (cerebrovascular accident) (HCC) 03/21/2020   Hypertensive urgency 03/21/2020   Bronchitis 03/21/2020   Asymptomatic bacteriuria  03/21/2020   Acute respiratory failure with hypoxia (HCC) 08/01/2017   Substance abuse (HCC) 08/01/2017   Constipation 08/01/2017   Anemia 08/01/2017   Rectal bleeding 08/01/2017   Alcohol withdrawal (HCC) 07/31/2017   Ventral hernia 10/04/2016   Incarcerated ventral hernia 10/03/2016   Anxiety 02/15/2014    Hypertension 02/15/2014   PCP:  Patient, No Pcp Per Pharmacy:   [id number] [place], [place] - [address] AT [place] [facility] & [address] [place] [address] Phone: [phone] Fax: [phone]     Social Determinants of Health (SDOH) Interventions    Readmission Risk Interventions    05/08/2022   11:43 AM  Readmission Risk Prevention Plan  Post Dischage Appt Complete  Transportation Screening Complete

## 2022-05-08 NOTE — Progress Notes (Addendum)
PROGRESS NOTE  [name] [id number] DOB: Feb 10, 1986 DOA: 05/01/2022 PCP: Patient, No Pcp Per   LOS: 6 days   Brief Narrative / Interim history: [name] is a 36 y.o. female with medical history significant of anxiety, liver cirrhosis, cocaine abuse, hypertension, unspecified renal disorder, history of other nonhemorrhagic CVA, ventral hernia, constipation who is coming to the emergency department due to abdominal pain associated with nausea, dry heaving and constipation for the past 3 days.  On presentation to the emergency department, patient was hypertensive with BP up to 189/128.  She was started on nicardipine drip.  CT abdomen pelvis as well as pelvic ultrasound was completed without clear etiology of abdominal pain found.  Due to fevers and continued abdominal pain, CTA abdomen pelvis was done which revealed fluid and inflammatory changes in the lower abdomen and pelvis, new small complex fluid collections in the pelvis.  Patient was started on antibiotics and general surgery consulted.  Hospital course complicated by multiple punctate CVAs in the setting of blood pressure fluctuations  Significant events: 9/8-admission 9/11-persistent fever, CTA shows new small complex fluid collection in the pelvis, general surgery consulted 9/12-GI consulted due to concern for Crohn's 9/13-MRI of the brain showing multiple areas of restricted diffusion compatible with acute infarcts in the right pons, left putamen, right thalamus, corpus callosum and left frontal white matter.  Significant imaging / results / micro data: CT abdomen pelvis, pelvic ultrasound 9/8-no significant acute findings CTA 9/11-new fluid and inflammatory changes in the lower abdomen and pelvis next to the uterus, loops of bowel without clear source.  This represents enteritis, colitis and possibly PID.  New small complex fluid collection in the pelvis, early versus developing small abscess. Blood cultures 9/10-no  growth to date, pending  Subjective / 24h Interval events: Some persistent abdominal pain, no chest pain, no shortness of breath.  Denies any new weakness  Assesement and Plan: Principal Problem:   Hypertensive urgency Active Problems:   Alcohol withdrawal (HCC)   Substance abuse (HCC)   Hepatic steatosis   Abdominal pain   Nausea and vomiting   Cocaine abuse (HCC)   Alcohol dependence ([place])   Hypokalemia   Principal problem Intra-abdominal infection-she initially presented with abdominal pain, nausea, vomiting, initial work-up with a CT abdomen and pelvis and a pelvic ultrasound was unremarkable.  Due to persistent pain and fever, underwent a repeat CT angiogram which showed fluid and inflammatory changes in the lower abdomen and pelvis, new small complex fluid collections in the pelvis.  Source for this inflammatory change is not clear, related to small bowel versus PID.  General surgery, gynecology as well as GI consulted.  She is currently being monitored on antibiotics with ciprofloxacin, metronidazole as well as doxycycline with plans for 10 days as for a PID case.  There is consideration about repeating CT scan this weekend to see how the fluid collections are evolving with potential colonoscopy on Monday per GI.  She has a strong family history of Crohn's disease -Now that her pain seems to be better, I spaced out the  Dilaudid yesterday afternoon  Active problems Acute multiple punctate CVAs-patient was complaining of right-sided heaviness on my evaluation Wednesday morning, unclear onset, but able to ambulate, and overall neuro exam was very inconsistent.  An MRI of the brain did however show multiple areas of small infarcts involving the right pons, left putamen, right thalamus, corpus callosum and left frontal white matter.  This is probably related to her fluctuation in  her blood pressure. On admission, her systolic was in the [id number] initially requiring Cardene drip.  Following  that, she was placed on amlodipine, clonidine, hydralazine.  Given MRI findings these medications have been now discontinued to allow permissive hypertension over the 48-72-hour post CVA.  Blood pressure stable now, continue to monitor.  Probably can resume some antihypertensives over the weekend -Stroke work-up done, CTA without significant large vessel obstructions, but new severe distal left A2 stenosis.  2D echo shows LVEF 60-65%, no WMA, LVH, grade 2 diastolic dysfunction.  RV was normal.  Limited bubble study echo without shunt.  A1c is 5.4.  Lipid panel shows an LDL of 84.  Neurology recommends dual antiplatelet therapy with aspirin and Plavix for 90 days followed by aspirin alone. Have not initiated Plavix pending work-up of her intra-abdominal infection and whether she needs interventions or not  Hypertensive urgency without organ failure -see discussion above, initially on nicardipine drip then on oral regimen which is now on hold to allow permissive hypertension in the setting of her stroke  Alcohol dependence -last dose of Librium was last night, she finished a taper, hopefully this will allow her to not be as drowsy   Substance abuse -UDS is negative for cocaine, admitted last cocaine use 2 weeks ago. UDS positive for opiate   Tobacco abuse -Nicotine patch  Hypokalemia-replete and continue to monitor   Scheduled Meds:   stroke: early stages of recovery book   Does not apply Once   aspirin EC  81 mg Oral Daily   atorvastatin  40 mg Oral Daily   Chlorhexidine Gluconate Cloth  6 each Topical Daily   docusate sodium  100 mg Oral BID   doxycycline  100 mg Oral [id number]   folic acid  1 mg Oral Daily   metoCLOPramide (REGLAN) injection  10 mg Intravenous Q6H   multivitamin with minerals  1 tablet Oral Daily   nicotine  21 mg Transdermal Daily   pantoprazole  40 mg Oral BID AC   thiamine  100 mg Oral Daily   Or   thiamine  100 mg Intravenous Daily   Continuous Infusions:   ciprofloxacin 400 mg (05/08/22 1017)   metronidazole 500 mg (05/08/22 1019)   PRN Meds:.acetaminophen, HYDROmorphone (DILAUDID) injection, hydrOXYzine, labetalol, ondansetron **OR** ondansetron (ZOFRAN) IV, mouth rinse, polyethylene glycol, traMADol  Current Outpatient Medications  Medication Instructions   acetaminophen (TYLENOL) 325-650 mg, Oral, Every 6 hours PRN   chlordiazePOXIDE (LIBRIUM) 25 mg, Oral, 3 times daily PRN   cloNIDine (CATAPRES) 0.1 mg, Oral, 2 times daily   ibuprofen (ADVIL) 200-400 mg, Oral, Every 6 hours PRN   omeprazole (PRILOSEC) 40 mg, Oral, Daily   propranolol (INDERAL) 40 mg, Oral, 2 times daily    Diet Orders (From admission, onward)     Start     Ordered   05/07/22 0925  Diet full liquid Room service appropriate? Yes; Fluid consistency: Thin  Diet effective now       Question Answer Comment  Room service appropriate? Yes   Fluid consistency: Thin      05/07/22 0924            DVT prophylaxis: SCDs Start: 05/01/22 1512   Lab Results  Component Value Date   PLT 327 05/08/2022      Code Status: Full Code  Family Communication: Mother at bedside  Status is: Inpatient Remains inpatient appropriate because: Intra-abdominal infection work-up underway  Level of care: Telemetry  Consultants:  General surgery GI  Objective: Vitals:   05/08/22 0403 05/08/22 0909 05/08/22 1020 05/08/22 1237  BP: (!) 144/87 (!) 154/98  127/77  Pulse: 76 88 80 79  Resp: 18 18  18   Temp: 98.8 F (37.1 C) 98.3 F (36.8 C)  98.3 F (36.8 C)  TempSrc:  Oral  Oral  SpO2: 93% 97% 96% 95%  Weight:      Height:        Intake/Output Summary (Last 24 hours) at 05/08/2022 1314 Last data filed at 05/08/2022 0915 Gross per 24 hour  Intake 360 ml  Output --  Net 360 ml    Wt Readings from Last 3 Encounters:  05/01/22 72 kg  03/20/20 68.9 kg  08/04/18 65.8 kg    Examination:  Constitutional: NAD Eyes: lids and conjunctivae normal, no scleral  icterus ENMT: mmm Neck: normal, supple Respiratory: clear to auscultation bilaterally, no wheezing, no crackles. Normal respiratory effort.  Cardiovascular: Regular rate and rhythm, no murmurs / rubs / gallops. No LE edema. Abdomen: Mild tenderness throughout, no guarding or rebound, bowel sounds positive Skin: no rashes Neurologic: no focal deficits, equal strength   Data Reviewed: I have independently reviewed following labs and imaging studies   CBC Recent Labs  Lab 05/02/22 0306 05/04/22 0948 05/06/22 0456 05/07/22 0528 05/08/22 0452  WBC 9.7 7.7 6.9 5.5 5.6  HGB 12.8 11.4* 11.3* 11.2* 10.2*  HCT 38.1 35.3* 35.9* 36.5 31.8*  PLT 178 178 209 283 327  MCV 90.5 93.6 97.6 97.6 94.6  MCH 30.4 30.2 30.7 29.9 30.4  MCHC 33.6 32.3 31.5 30.7 32.1  RDW 14.5 15.7* 15.5 15.7* 15.4     Recent Labs  Lab 05/02/22 0306 05/02/22 0852 05/03/22 0530 05/04/22 0456 05/04/22 0948 05/04/22 1315 05/05/22 0505 05/06/22 0456 05/07/22 0528 05/08/22 0452  NA 132*  --  133* 136  --   --  134* 136 135 138  K 3.2*  --  3.7 3.4*  --   --  3.6 4.1 3.6 3.4*  CL 101  --  102 101  --   --  104 106 105 106  CO2 23  --  24 24  --   --  23 21* 22 25  GLUCOSE 165*  --  114* 135*  --   --  133* 98 91 101*  BUN 8  --  12 18  --   --  23* 15 12 10   CREATININE 0.54  --  0.49 0.79  --   --  0.85 0.69 0.60 0.50  CALCIUM 8.5*  --  8.3* 8.9  --   --  8.6* 8.8* 8.7* 8.7*  AST 17  --   --   --   --   --   --   --  10* 12*  ALT 14  --   --   --   --   --   --   --  11 12  ALKPHOS 75  --   --   --   --   --   --   --  71 76  BILITOT 0.7  --   --   --   --   --   --   --  0.7 0.4  ALBUMIN 3.5  --   --   --   --   --   --   --  2.7* 2.5*  MG  --   --  1.7  --   --   --   --   --  1.8 1.8  LATICACIDVEN  --  1.6  --   --  1.0 1.0  --   --   --   --   HGBA1C  --   --   --   --   --   --   --   --  5.4  --       ------------------------------------------------------------------------------------------------------------------ Recent Labs    05/07/22 0528  CHOL 129  HDL 23*  LDLCALC 84  TRIG 762  CHOLHDL 5.6     Lab Results  Component Value Date   HGBA1C 5.4 05/07/2022   ------------------------------------------------------------------------------------------------------------------ No results for input(s): "TSH", "T4TOTAL", "T3FREE", "THYROIDAB" in the last 72 hours.  Invalid input(s): "FREET3"  Cardiac Enzymes No results for input(s): "CKMB", "TROPONINI", "MYOGLOBIN" in the last 168 hours.  Invalid input(s): "CK" ------------------------------------------------------------------------------------------------------------------ No results found for: "BNP"  CBG: No results for input(s): "GLUCAP" in the last 168 hours.  Recent Results (from the past 240 hour(s))  MRSA Next Gen by PCR, Nasal     Status: None   Collection Time: 05/01/22  5:17 PM   Specimen: Nasal Mucosa; Nasal Swab  Result Value Ref Range Status   MRSA by PCR Next Gen NOT DETECTED NOT DETECTED Final    Comment: (NOTE) The GeneXpert MRSA Assay (FDA approved for NASAL specimens only), is one component of a comprehensive MRSA colonization surveillance program. It is not intended to diagnose MRSA infection nor to guide or monitor treatment for MRSA infections. Test performance is not FDA approved in patients less than 64 years old. Performed at [facility], [address], [place], [place] [address]   Culture, blood (Routine X 2) w Reflex to ID Panel     Status: None   Collection Time: 05/03/22  5:30 AM   Specimen: Right Antecubital; Blood  Result Value Ref Range Status   Specimen Description   Final    RIGHT ANTECUBITAL BLOOD Performed at [facility], [address], [place], [place] [address]    Special Requests   Final    AEROBIC BOTTLE ONLY Blood Culture results may not be  optimal due to an inadequate volume of blood received in culture bottles Performed at [facility], [address], [place], [place] [address]    Culture   Final    NO GROWTH 5 DAYS Performed at [facility], [address] [place], [place] [address]    Report Status 05/08/2022 FINAL  Final  Culture, blood (Routine X 2) w Reflex to ID Panel     Status: None   Collection Time: 05/03/22  5:31 AM   Specimen: Left Antecubital; Blood  Result Value Ref Range Status   Specimen Description   Final    LEFT ANTECUBITAL BLOOD Performed at [facility], [address], [place], [place] [address]    Special Requests   Final    AEROBIC BOTTLE ONLY Blood Culture adequate volume Performed at [facility], [address], [place], [place] [address]    Culture   Final    NO GROWTH 5 DAYS Performed at [facility], [address], [place], [place] [address]    Report Status 05/08/2022 FINAL  Final     Radiology Studies: No results found.   Pamella Pert, MD, PhD [facility]  Between 7 am - 7 pm I am available, please contact me via Amion (for emergencies) or Securechat (non urgent messages)  Between 7 pm -  7 am I am not available, please contact night coverage MD/APP via Amion

## 2022-05-08 NOTE — Progress Notes (Signed)
Subjective: CC: From an abdominal standpoint still having RLQ pain and nausea on CLD. No vomiting. No bm yesterday.   New CVA 9/13 noted on MRI. She reported fall yesterday after MRI. No staff in room when this occurred. Overall abdominal discomfort improving  Objective: Vital signs in last 24 hours: Temp:  [98.3 F (36.8 C)-98.9 F (37.2 C)] 98.3 F (36.8 C) (09/15 0909) Pulse Rate:  [76-88] 88 (09/15 0909) Resp:  [17-24] 18 (09/15 0909) BP: (124-154)/(71-98) 154/98 (09/15 0909) SpO2:  [93 %-97 %] 97 % (09/15 0909) Last BM Date : 05/07/22  Intake/Output from previous day: 09/14 0701 - 09/15 0700 In: 480 [P.O.:480] Out: -  Intake/Output this shift: Total I/O In: 240 [P.O.:240] Out: -   PE: Gen:  Alert, NAD Neck: No c-spine ttp or step offs.  Pulm:  CTAB Abd: Soft, ND, RLQ mildly tender, no rebound nor guarding Psych: A&Ox3   Lab Results:  Recent Labs    05/07/22 0528 05/08/22 0452  WBC 5.5 5.6  HGB 11.2* 10.2*  HCT 36.5 31.8*  PLT 283 327   BMET Recent Labs    05/07/22 0528 05/08/22 0452  NA 135 138  K 3.6 3.4*  CL 105 106  CO2 22 25  GLUCOSE 91 101*  BUN 12 10  CREATININE 0.60 0.50  CALCIUM 8.7* 8.7*   PT/INR No results for input(s): "LABPROT", "INR" in the last 72 hours. CMP     Component Value Date/Time   NA 138 05/08/2022 0452   K 3.4 (L) 05/08/2022 0452   CL 106 05/08/2022 0452   CO2 25 05/08/2022 0452   GLUCOSE 101 (H) 05/08/2022 0452   BUN 10 05/08/2022 0452   CREATININE 0.50 05/08/2022 0452   CREATININE 0.69 09/23/2016 1442   CALCIUM 8.7 (L) 05/08/2022 0452   PROT 6.2 (L) 05/08/2022 0452   ALBUMIN 2.5 (L) 05/08/2022 0452   AST 12 (L) 05/08/2022 0452   ALT 12 05/08/2022 0452   ALKPHOS 76 05/08/2022 0452   BILITOT 0.4 05/08/2022 0452   GFRNONAA >60 05/08/2022 0452   GFRNONAA >89 09/23/2016 1442   GFRAA >60 05/09/2020 0524   GFRAA >89 09/23/2016 1442   Lipase     Component Value Date/Time   LIPASE 39 05/01/2022 1025     Studies/Results: ECHOCARDIOGRAM LIMITED BUBBLE STUDY  Result Date: 05/07/2022    ECHOCARDIOGRAM LIMITED REPORT   Patient Name:   Dierdre Highman Date of Exam: 05/07/2022 Medical Rec #:  [id number]          Height:       60.0 in Accession #:    [id number]         Weight:       158.7 lb Date of Birth:  1986-08-17           BSA:          1.692 m Patient Age:    89 years           BP:           135/84 mmHg Patient Gender: F                  HR:           76 bpm. Exam Location:  Inpatient Procedure: Limited Echo and Saline Contrast Bubble Study Indications:    stroke  History:        Patient has prior history of Echocardiogram examinations, most  recent 05/06/2022. Stroke.  Sonographer:    Harvie Junior Referring Phys: [place]  1. Limited bubble study for stroke  2. Left ventricular ejection fraction, by estimation, is 60 to 65%. The left ventricle has normal function.  3. Agitated saline contrast bubble study was negative, with no evidence of any interatrial shunt. Comparison(s): 05/06/2022: LVEF 60-65%. FINDINGS  Left Ventricle: Left ventricular ejection fraction, by estimation, is 60 to 65%. The left ventricle has normal function. IAS/Shunts: No atrial level shunt detected by color flow Doppler. Agitated saline contrast was given intravenously to evaluate for intracardiac shunting. Agitated saline contrast bubble study was negative, with no evidence of any interatrial shunt. Lyman Bishop MD Electronically signed by Lyman Bishop MD Signature Date/Time: 05/07/2022/2:51:35 PM    Final    DG Pelvis 1-2 Views  Result Date: 05/07/2022 CLINICAL DATA:  Trauma, fall EXAM: PELVIS - 1-2 VIEW COMPARISON:  None Available. FINDINGS: No fracture or dislocation is seen.  SI joints are symmetrical. IMPRESSION: No fracture or dislocation is seen. Electronically Signed   By: Elmer Picker M.D.   On: 05/07/2022 09:36   CT ANGIO HEAD W OR WO CONTRAST  Result Date:  05/06/2022 CLINICAL DATA:  Stroke follow-up. EXAM: CT ANGIOGRAPHY HEAD AND NECK TECHNIQUE: Multidetector CT imaging of the head and neck was performed using the standard protocol during bolus administration of intravenous contrast. Multiplanar CT image reconstructions and MIPs were obtained to evaluate the vascular anatomy. Carotid stenosis measurements (when applicable) are obtained utilizing NASCET criteria, using the distal internal carotid diameter as the denominator. RADIATION DOSE REDUCTION: This exam was performed according to the departmental dose-optimization program which includes automated exposure control, adjustment of the mA and/or kV according to patient size and/or use of iterative reconstruction technique. CONTRAST:  73mL OMNIPAQUE IOHEXOL 350 MG/ML SOLN COMPARISON:  Head and neck CTA 03/21/2020 FINDINGS: CTA NECK FINDINGS Aortic arch: Standard 3 vessel aortic arch. Widely patent arch vessel origins. Right carotid system: Patent and smooth without evidence of stenosis or dissection. Tortuous proximal ICA. Left carotid system: Patent and smooth without evidence of stenosis or dissection. Tortuous mid cervical ICA. Vertebral arteries: Patent, smooth, and codominant without evidence of stenosis or dissection. Skeleton: Widespread dental caries and periapical lucencies. Other neck: No evidence of cervical lymphadenopathy or mass. Upper chest: Clear lung apices. Review of the MIP images confirms the above findings CTA HEAD FINDINGS Anterior circulation: The internal carotid arteries are widely patent from skull base to carotid termini. ACAs and MCAs are patent without evidence of a proximal branch occlusion or significant proximal stenosis. There is a new severe distal left A2 stenosis. No aneurysm is identified. Posterior circulation: The intracranial vertebral arteries are widely patent to the basilar. Patent PICA, AICA, and SCA origins are seen bilaterally. The basilar artery is widely patent. There  is a large left posterior communicating artery with absent left P1 segment. Both PCAs are patent without evidence of a significant proximal stenosis. No aneurysm is identified. Venous sinuses: Patent. Anatomic variants: Fetal left PCA. Review of the MIP images confirms the above findings IMPRESSION: 1. No large vessel occlusion or significant proximal stenosis in the head or neck. 2. New severe distal left A2 stenosis. Electronically Signed   By: Logan Bores M.D.   On: 05/06/2022 18:17   CT ANGIO NECK W OR WO CONTRAST  Result Date: 05/06/2022 CLINICAL DATA:  Stroke follow-up. EXAM: CT ANGIOGRAPHY HEAD AND NECK TECHNIQUE: Multidetector CT imaging of the head and neck was performed using  the standard protocol during bolus administration of intravenous contrast. Multiplanar CT image reconstructions and MIPs were obtained to evaluate the vascular anatomy. Carotid stenosis measurements (when applicable) are obtained utilizing NASCET criteria, using the distal internal carotid diameter as the denominator. RADIATION DOSE REDUCTION: This exam was performed according to the departmental dose-optimization program which includes automated exposure control, adjustment of the mA and/or kV according to patient size and/or use of iterative reconstruction technique. CONTRAST:  31mL OMNIPAQUE IOHEXOL 350 MG/ML SOLN COMPARISON:  Head and neck CTA 03/21/2020 FINDINGS: CTA NECK FINDINGS Aortic arch: Standard 3 vessel aortic arch. Widely patent arch vessel origins. Right carotid system: Patent and smooth without evidence of stenosis or dissection. Tortuous proximal ICA. Left carotid system: Patent and smooth without evidence of stenosis or dissection. Tortuous mid cervical ICA. Vertebral arteries: Patent, smooth, and codominant without evidence of stenosis or dissection. Skeleton: Widespread dental caries and periapical lucencies. Other neck: No evidence of cervical lymphadenopathy or mass. Upper chest: Clear lung apices. Review of  the MIP images confirms the above findings CTA HEAD FINDINGS Anterior circulation: The internal carotid arteries are widely patent from skull base to carotid termini. ACAs and MCAs are patent without evidence of a proximal branch occlusion or significant proximal stenosis. There is a new severe distal left A2 stenosis. No aneurysm is identified. Posterior circulation: The intracranial vertebral arteries are widely patent to the basilar. Patent PICA, AICA, and SCA origins are seen bilaterally. The basilar artery is widely patent. There is a large left posterior communicating artery with absent left P1 segment. Both PCAs are patent without evidence of a significant proximal stenosis. No aneurysm is identified. Venous sinuses: Patent. Anatomic variants: Fetal left PCA. Review of the MIP images confirms the above findings IMPRESSION: 1. No large vessel occlusion or significant proximal stenosis in the head or neck. 2. New severe distal left A2 stenosis. Electronically Signed   By: Logan Bores M.D.   On: 05/06/2022 18:17   ECHOCARDIOGRAM COMPLETE  Result Date: 05/06/2022    ECHOCARDIOGRAM REPORT   Patient Name:   SHAHED GREENHUT Date of Exam: 05/06/2022 Medical Rec #:  [id number]          Height:       60.0 in Accession #:    [id number]         Weight:       158.7 lb Date of Birth:  10-20-1985           BSA:          1.692 m Patient Age:    71 years           BP:           117/77 mmHg Patient Gender: F                  HR:           80 bpm. Exam Location:  Inpatient Procedure: 2D Echo, Cardiac Doppler and Color Doppler Indications:    Stroke  History:        Patient has prior history of Echocardiogram examinations, most                 recent 03/21/2020. Risk Factors:Hypertension.  Sonographer:    Jefferey Pica Referring Phys: [place]  1. Left ventricular ejection fraction, by estimation, is 60 to 65%. The left ventricle has normal function. The left ventricle has no regional wall motion  abnormalities. There is mild concentric left ventricular hypertrophy.  Left ventricular diastolic parameters are consistent with Grade II diastolic dysfunction (pseudonormalization).  2. Right ventricular systolic function is normal. The right ventricular size is normal. There is normal pulmonary artery systolic pressure. The estimated right ventricular systolic pressure is 16.2 mmHg.  3. Left atrial size was mildly dilated.  4. The mitral valve is normal in structure. No evidence of mitral valve regurgitation. No evidence of mitral stenosis.  5. The aortic valve is tricuspid. Aortic valve regurgitation is not visualized. No aortic stenosis is present.  6. The inferior vena cava is normal in size with greater than 50% respiratory variability, suggesting right atrial pressure of 3 mmHg. FINDINGS  Left Ventricle: Left ventricular ejection fraction, by estimation, is 60 to 65%. The left ventricle has normal function. The left ventricle has no regional wall motion abnormalities. The left ventricular internal cavity size was normal in size. There is  mild concentric left ventricular hypertrophy. Left ventricular diastolic parameters are consistent with Grade II diastolic dysfunction (pseudonormalization). Right Ventricle: The right ventricular size is normal. No increase in right ventricular wall thickness. Right ventricular systolic function is normal. There is normal pulmonary artery systolic pressure. The tricuspid regurgitant velocity is 1.82 m/s, and  with an assumed right atrial pressure of 3 mmHg, the estimated right ventricular systolic pressure is 16.2 mmHg. Left Atrium: Left atrial size was mildly dilated. Right Atrium: Right atrial size was normal in size. Pericardium: Trivial pericardial effusion is present. Mitral Valve: The mitral valve is normal in structure. No evidence of mitral valve regurgitation. No evidence of mitral valve stenosis. Tricuspid Valve: The tricuspid valve is normal in structure.  Tricuspid valve regurgitation is trivial. Aortic Valve: The aortic valve is tricuspid. Aortic valve regurgitation is not visualized. No aortic stenosis is present. Aortic valve peak gradient measures 10.4 mmHg. Pulmonic Valve: The pulmonic valve was normal in structure. Pulmonic valve regurgitation is trivial. Aorta: The aortic root is normal in size and structure. Venous: The inferior vena cava is normal in size with greater than 50% respiratory variability, suggesting right atrial pressure of 3 mmHg. IAS/Shunts: No atrial level shunt detected by color flow Doppler.  LEFT VENTRICLE PLAX 2D LVIDd:         4.30 cm   Diastology LVIDs:         2.90 cm   LV e' medial:    5.63 cm/s LV PW:         1.50 cm   LV E/e' medial:  17.8 LV IVS:        1.50 cm   LV e' lateral:   7.51 cm/s LVOT diam:     1.90 cm   LV E/e' lateral: 13.3 LV SV:         75 LV SV Index:   45 LVOT Area:     2.84 cm  RIGHT VENTRICLE             IVC RV Basal diam:  2.90 cm     IVC diam: 1.50 cm RV S prime:     13.00 cm/s TAPSE (M-mode): 2.5 cm LEFT ATRIUM             Index        RIGHT ATRIUM           Index LA diam:        3.10 cm 1.83 cm/m   RA Area:     15.40 cm LA Vol (A2C):   53.7 ml 31.74 ml/m  RA Volume:   38.90 ml  22.99 ml/m LA Vol (A4C):   62.7 ml 37.06 ml/m LA Biplane Vol: 59.3 ml 35.05 ml/m  AORTIC VALVE                 PULMONIC VALVE AV Area (Vmax): 2.40 cm     PV Vmax:       0.75 m/s AV Vmax:        161.00 cm/s  PV Peak grad:  2.2 mmHg AV Peak Grad:   10.4 mmHg LVOT Vmax:      136.00 cm/s LVOT Vmean:     99.100 cm/s LVOT VTI:       0.266 m  AORTA Ao Root diam: 3.40 cm Ao Asc diam:  3.30 cm MITRAL VALVE                TRICUSPID VALVE MV Area (PHT): 3.06 cm     TR Peak grad:   13.2 mmHg MV Decel Time: 248 msec     TR Vmax:        182.00 cm/s MV E velocity: 100.00 cm/s MV A velocity: 87.50 cm/s   SHUNTS MV E/A ratio:  1.14         Systemic VTI:  0.27 m                             Systemic Diam: 1.90 cm Dalton McleanMD Electronically  signed by Franki Monte Signature Date/Time: 05/06/2022/5:45:29 PM    Final    MR BRAIN WO CONTRAST  Result Date: 05/06/2022 CLINICAL DATA:  Acute neuro deficit. EXAM: MRI HEAD WITHOUT CONTRAST TECHNIQUE: Multiplanar, multiecho pulse sequences of the brain and surrounding structures were obtained without intravenous contrast. COMPARISON:  CT head 05/06/2022.  MRI head 03/20/2020 FINDINGS: Brain: Multiple areas of restricted diffusion compatible with acute infarct. These are all small areas of infarct involving the right pons, left putamen, right thalamus, corpus callosum, and the left frontal Chloeann Alfred matter. Chronic infarcts in the pons especially on the right. Chronic lacunar infarcts in the thalamus bilaterally. Chronic infarcts in the basal ganglia bilaterally. Multiple foci of chronic hemorrhage involving the pons. Chronic microhemorrhage in the right thalamus and right putamen. No hydrocephalus or mass lesion. Vascular: Normal arterial flow voids Skull and upper cervical spine: Negative Sinuses/Orbits: Mild mucosal edema paranasal sinuses. Negative orbit Other: None IMPRESSION: Numerous small areas of acute infarct as described above. Correlate with recent history of hypertension or hypotension. Cerebral emboli possible however no cortical infarct is seen as would be expected with emboli. Advanced for age chronic microvascular ischemia and multiple areas of chronic microhemorrhage. Correlate with risk factors for small vessel disease. Electronically Signed   By: Franchot Gallo M.D.   On: 05/06/2022 14:51   CT HEAD WO CONTRAST (5MM)  Result Date: 05/06/2022 CLINICAL DATA:  Patient is unable to move the right foot this morning. 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 RADIATION DOSE REDUCTION: This exam was performed according to the departmental  dose-optimization program which includes automated exposure control, adjustment of the mA and/or kV according to patient size and/or use of iterative reconstruction technique. COMPARISON:  CT brain 03/20/2020, MRI head 03/20/2020 FINDINGS: Brain: Chronic left thalamic and left lentiform nucleus lacunar infarcts. There is also a chronic infarct in the right thalamus. Compared to prior exams there is an age indeterminate hypodensity  in the left caudate head (series 2, image 15). No evidence of hydrocephalus. No hemorrhage. No extra-axial fluid collections. Vascular: No hyperdense vessel or unexpected calcification. Skull: Normal. Negative for fracture or focal lesion. Sinuses/Orbits: No acute finding. Other: None. IMPRESSION: Compared to prior exam there is a new hypodensity in the left caudate head. Although this has a chronic appearance, it was not visualized on prior imaging and is technically age indeterminate. If there is clinical concern for an acute infarct, this could be further assessed with a brain MRI. Electronically Signed   By: Lorenza Cambridge M.D.   On: 05/06/2022 12:01    Anti-infectives: Anti-infectives (From admission, onward)    Start     Dose/Rate Route Frequency Ordered Stop   05/05/22 1315  doxycycline (VIBRA-TABS) tablet 100 mg        100 mg Oral Every 12 hours 05/05/22 1223     05/04/22 2200  metroNIDAZOLE (FLAGYL) IVPB 500 mg        500 mg 100 mL/hr over 60 Minutes Intravenous Every 12 hours 05/04/22 1434     05/04/22 1530  metroNIDAZOLE (FLAGYL) IVPB 500 mg        500 mg 100 mL/hr over 60 Minutes Intravenous  Once 05/04/22 1434 05/04/22 1645   05/04/22 1530  ciprofloxacin (CIPRO) IVPB 400 mg        400 mg 200 mL/hr over 60 Minutes Intravenous 2 times daily 05/04/22 1440     05/04/22 1515  metroNIDAZOLE (FLAGYL) IVPB 500 mg  Status:  Discontinued        500 mg 100 mL/hr over 60 Minutes Intravenous Every 12 hours 05/04/22 1428 05/04/22 1434        Assessment/Plan RLQ  abdominal pain Fever Pelvic abscesses  36 year old female who presents with 1 week of progressively worsening right lower quadrant abdominal pain, fever, vomiting.  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  CTA abdomen performed 9/11 shows development of new fluid and inflammatory changes in the lower abdomen and pelvis - No indication for emergency surgery - Agree with IV abx - Nothing drainable at this time per IR. Monitor to see if she will need a repeat scan in the coming days  - Constellation of symptoms, her significant family history of Crohn's and CT findings are most suspicious for significant Crohn's disease. GI following. Another possible etiology is PID for which GYN has seen for, recommended Cipro/Flagyl + Doxy x 10d course. GYN ok w/ steroids if GI felt they were indicated - CCS will follow   FEN - diet as tolerated VTE - SCD's, ok for DVT ppx from surgical perspective. Now on ASA ID - Cipro/flagyl/Doxy (severe PCN allergy).    - Per [facility] -  New CVA - neuro to see, on asa GLF HTN urgency Alcohol dependence Polysubstance abuse    LOS: 6 days   I spent a total of 35 minutes in both face-to-face and non-face-to-face activities, excluding procedures performed, for this visit on the date of this encounter.   Check [email or web address] for General Surgery coverage night/weekend/holidays  Page if acute issues. No secure chat available for me given surgeries/clinic/off post call which would lead to a delay in care.  Marin Olp, MD [facility], [facility]

## 2022-05-08 NOTE — Progress Notes (Signed)
[facility] Gastroenterology Progress Note  [name] 36 y.o. 1985-10-25  Subjective: Patient seen and examined in bed.  She continues to have some lower abdominal pain but improving.  Tolerating full liquids well.     ROS : Review of Systems  Gastrointestinal:  Negative for abdominal pain, blood in stool, constipation, diarrhea, heartburn, melena, nausea and vomiting.  Genitourinary:  Negative for dysuria and urgency.      Objective: Vital signs in last 24 hours: Vitals:   05/08/22 0909 05/08/22 1020  BP: (!) 154/98   Pulse: 88 80  Resp: 18   Temp: 98.3 F (36.8 C)   SpO2: 97% 96%    Physical Exam:  General:  Alert, cooperative, no distress, appears stated age  Head:  Normocephalic, without obvious abnormality, atraumatic  Eyes:  Anicteric sclera, EOM's intact  Lungs:   Clear to auscultation bilaterally, respirations unlabored  Heart:  Regular rate and rhythm, S1, S2 normal  Abdomen:   Soft, non-tender, bowel sounds active all four quadrants,  no masses,   Extremities: Extremities normal, atraumatic, no  edema  Pulses: 2+ and symmetric    Lab Results: Recent Labs    05/07/22 0528 05/08/22 0452  NA 135 138  K 3.6 3.4*  CL 105 106  CO2 22 25  GLUCOSE 91 101*  BUN 12 10  CREATININE 0.60 0.50  CALCIUM 8.7* 8.7*  MG 1.8 1.8  PHOS 4.0  --    Recent Labs    05/07/22 0528 05/08/22 0452  AST 10* 12*  ALT 11 12  ALKPHOS 71 76  BILITOT 0.7 0.4  PROT 6.2* 6.2*  ALBUMIN 2.7* 2.5*   Recent Labs    05/07/22 0528 05/08/22 0452  WBC 5.5 5.6  HGB 11.2* 10.2*  HCT 36.5 31.8*  MCV 97.6 94.6  PLT 283 327   No results for input(s): "LABPROT", "INR" in the last 72 hours.    Assessment Abdominal pain Reported family history of Crohn's disease Pelvic abscess   With intermittent lower abdominal pain for several years now presenting to the hospital with severe constant bilateral lower abdominal pain.  She has been intermittently febrile during admission temp  of 103.1 on 05/03/2022, he has been afebrile for the last 24 hours.  She is currently on metronidazole 500 mg twice daily and Cipro 400 mg twice daily for intra-abdominal infection.  OBGYN consult for possible PID, and added doxycycline for PID coverage. WBC is not elevated today.  CT angiogram with findings concerning for pelvic abscess.  Evaluation by interventional radiology with no recommended areas to drain at this time.  CT angio also showed possible source of inflammation within the small bowel.  Patient with significant reported family history of Crohn's disease.  Findings concerning for possible Crohn's disease.   CT angio abdomen pelvis with and without contrast 05/04/2022 1. New fluid and inflammatory changes in the lower abdomen and pelvis. These inflammatory changes are near the uterus, adnexa and loops of bowel. Inflammation source is unclear. Differential diagnosis includes enteritis, colitis and possibly pelvic inflammatory disease. Dilatation of some small bowel loops suggests that small bowel could be the inflammatory source. 2. New small complex fluid collections in the pelvis. Early or developing small abscess collections cannot be excluded. 3. Slightly enlarged retroperitoneal lymph nodes as described. Findings are nonspecific but could be reactive. 4. Mild wall thickening in the urinary bladder is nonspecific due to incomplete distension and could be also be related to surrounding inflammatory changes.   Plan: Consider decreasing  pain medications, patient is somewhat lethargic Repeat CT over the weekend, tentatively proceed with colonoscopy Monday.  Continue full liquid diet.  Continue Protonix 40 mg BID.  [facility] will follow.  Angela Reno Londyn Wotton PA-C 05/08/2022, 11:21 AM  Contact #  [phone]

## 2022-05-08 NOTE — Evaluation (Signed)
Patient has been educated several times on calling before she gets out of bed and has refused to do so. Pt is high fall risk and bed alarm is on. Family has also been educated on calling before patient gets out of bed.

## 2022-05-08 NOTE — Progress Notes (Signed)
Physical Therapy Treatment Patient Details Name: [name] MRN: [id number] DOB: 08-06-1986 Today's Date: 05/08/2022   History of Present Illness 36 year old female who presents with 1 week of progressively worsening right lower quadrant abdominal pain, fever, vomiting.  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  CTA abdomen performed 9/11 shows development of new fluid and inflammatory changes in the lower abdomen and pelvis. Pt also found to have acute multiple punctate CVAs:  05/06/22 MRI of the brain showed multiple areas of small infarcts involving the right pons, left putamen, right thalamus, corpus callosum and left frontal white matter.  PMHx: anxiety, polysubstance abuse, liver cirrhosis, hypertension, history of other nonhemorrhagic CVA, ventral hernia, constipation    PT Comments    Pt presents with poor safety awareness.  Pt's more alert today and mobility has improved somewhat since yesterday, however would recommend close supervision upon d/c due to impaired coordination, strength and balance.  Recommend CIR however if not deemed an appropriate candidate, then HHPT with supervision for mobility for safety upon d/c.    Recommendations for follow up therapy are one component of a multi-disciplinary discharge planning process, led by the attending physician.  Recommendations may be updated based on patient status, additional functional criteria and insurance authorization.  Follow Up Recommendations  Acute inpatient rehab (3hours/day)     Assistance Recommended at Discharge    Patient can return home with the following A little help with walking and/or transfers;A little help with bathing/dressing/bathroom;Help with stairs or ramp for entrance   Equipment Recommendations  Rolling walker (2 wheels)    Recommendations for Other Services       Precautions / Restrictions Precautions Precautions: Fall      Mobility  Bed Mobility Overal bed mobility: Needs Assistance Bed Mobility: Supine to Sit, Sit to Supine     Supine to sit: Min guard Sit to supine: Min guard   General bed mobility comments: min/guard for safety as pt quickly trying to get OOB with rail up and tangled in sheet    Transfers Overall transfer level: Needs assistance Equipment used: Rolling walker (2 wheels) Transfers: Sit to/from Stand Sit to Stand: Min guard           General transfer comment: pt exiting bed with rail still in place and return to bed with one feet folded under herself so provided close guard for safety    Ambulation/Gait Ambulation/Gait assistance: Min guard Gait Distance (Feet): 300 Feet Assistive device: Rolling walker (2 wheels) Gait Pattern/deviations: Step-through pattern, Decreased stride length, Drifts right/left, Trunk flexed       General Gait Details: decreased control of movement, assist to correct and keep RW with pt with cues for remain inside RW, leaning heavily to right side upon last 100 feet requiring assist, multimodal cues for avoiding obstacles which pt had great difficulty   Stairs             Wheelchair Mobility    Modified Rankin (Stroke Patients Only)       Balance                                            Cognition Arousal/Alertness: Awake/alert Behavior During Therapy: Flat affect  General Comments: poor ability to recall pain management,  follows simple commands, impaired safety awareness        Exercises      General Comments        Pertinent Vitals/Pain Pain Assessment Pain Assessment: 0-10 Pain Score: 10-Worst pain ever Pain Location: abdomen Pain Descriptors / Indicators: Aching, Discomfort Pain Intervention(s): Repositioned, Monitored during session (not due for pain meds)    Home Living                          Prior Function             PT Goals (current goals can now be found in the care plan section) Progress towards PT goals: Progressing toward goals    Frequency    Min 4X/week      PT Plan Current plan remains appropriate    Co-evaluation              AM-PAC PT "6 Clicks" Mobility   Outcome Measure  Help needed turning from your back to your side while in a flat bed without using bedrails?: A Little Help needed moving from lying on your back to sitting on the side of a flat bed without using bedrails?: A Little Help needed moving to and from a bed to a chair (including a wheelchair)?: A Little Help needed standing up from a chair using your arms (e.g., wheelchair or bedside chair)?: A Little Help needed to walk in hospital room?: A Little Help needed climbing 3-5 steps with a railing? : A Lot 6 Click Score: 17    End of Session Equipment Utilized During Treatment: Gait belt Activity Tolerance: Patient tolerated treatment well Patient left: in bed;with call bell/phone within reach;with family/visitor present Nurse Communication: Mobility status PT Visit Diagnosis: Other abnormalities of gait and mobility (R26.89)     Time: 6606-3016 PT Time Calculation (min) (ACUTE ONLY): 11 min  Charges:  $Gait Training: 8-22 mins                    Thomasene Mohair PT, DPT Physical Therapist Acute Rehabilitation Services Preferred contact method: Secure Chat Weekend Pager Only: [phone] Office: [phone]    [name] 05/08/2022, 3:51 PM

## 2022-05-08 NOTE — Progress Notes (Signed)
Encouraged patient to call for help, for staff to assist her when getting up, patient not calling and the bed alarm on, patient will not allow assist while going to the bathroom and wants staff out of the bathroom. Educated patient on the safety reasons, especially with recent stroke. Patient's mom at the bedside also reinforcing to patient to call and waiting for help, patient kept saying she can do it on her own and don't anyone's help.

## 2022-05-09 DIAGNOSIS — I169 Hypertensive crisis, unspecified: Secondary | ICD-10-CM

## 2022-05-09 DIAGNOSIS — F1020 Alcohol dependence, uncomplicated: Secondary | ICD-10-CM

## 2022-05-09 DIAGNOSIS — F10930 Alcohol use, unspecified with withdrawal, uncomplicated: Secondary | ICD-10-CM

## 2022-05-09 DIAGNOSIS — R1030 Lower abdominal pain, unspecified: Secondary | ICD-10-CM

## 2022-05-09 MED ORDER — AMLODIPINE BESYLATE 5 MG PO TABS
5.0000 mg | ORAL_TABLET | Freq: Every day | ORAL | Status: DC
  Administered 2022-05-09 – 2022-05-11 (×3): 5 mg via ORAL
  Filled 2022-05-09 (×3): qty 1

## 2022-05-09 MED ORDER — CLOPIDOGREL BISULFATE 75 MG PO TABS: 75.0000 mg | ORAL_TABLET | Freq: Every day | ORAL | Status: DC

## 2022-05-09 NOTE — Progress Notes (Signed)
PT Cancellation Note  Patient Details Name: [name] MRN: [id number] DOB: 26-Apr-1986   Cancelled Treatment:    Reason Eval/Treat Not Completed: Fatigue/lethargy limiting ability to participate, patient had pain medication and is  very drowsy. Will check back another time. [place] Office [phone] Weekend [phone]    Claretha Cooper 05/09/2022, 12:53 PM

## 2022-05-09 NOTE — Progress Notes (Signed)
Assumed care of patient at 1500 from [facility] [place]. Agree with previously documented assessment and will continue current plan of care.

## 2022-05-09 NOTE — Progress Notes (Signed)
Subjective/Chief Complaint: Patient complains of abdominal pain.  Stating that the medications were not going through her IV.   Objective: Vital signs in last 24 hours: Temp:  [97.5 F (36.4 C)-98.3 F (36.8 C)] 98.3 F (36.8 C) (09/16 0841) Pulse Rate:  [71-79] 78 (09/16 0841) Resp:  [18] 18 (09/16 0841) BP: (127-173)/(77-104) 173/104 (09/16 0841) SpO2:  [95 %-98 %] 98 % (09/16 0841) Last BM Date : 05/08/22  Intake/Output from previous day: 09/15 0701 - 09/16 0700 In: 1014 [P.O.:714; IV Piggyback:300] Out: -  Intake/Output this shift: No intake/output data recorded.   Gen:  Alert, NAD Neck: No c-spine ttp or step offs.  Pulm:  CTAB Abd: Soft, ND, RLQ mildly tender, no rebound nor guarding Psych: A&Ox3  Lab Results:  Recent Labs    05/07/22 0528 05/08/22 0452  WBC 5.5 5.6  HGB 11.2* 10.2*  HCT 36.5 31.8*  PLT 283 327   BMET Recent Labs    05/07/22 0528 05/08/22 0452  NA 135 138  K 3.6 3.4*  CL 105 106  CO2 22 25  GLUCOSE 91 101*  BUN 12 10  CREATININE 0.60 0.50  CALCIUM 8.7* 8.7*   PT/INR No results for input(s): "LABPROT", "INR" in the last 72 hours. ABG No results for input(s): "PHART", "HCO3" in the last 72 hours.  Invalid input(s): "PCO2", "PO2"  Studies/Results: ECHOCARDIOGRAM LIMITED BUBBLE STUDY  Result Date: 05/07/2022    ECHOCARDIOGRAM LIMITED REPORT   Patient Name:   [name] Date of Exam: 05/07/2022 Medical Rec #:  [id number]          Height:       60.0 in Accession #:    [id number]         Weight:       158.7 lb Date of Birth:  27-Apr-1986           BSA:          1.692 m Patient Age:    36 years           BP:           135/84 mmHg Patient Gender: F                  HR:           76 bpm. Exam Location:  Inpatient Procedure: Limited Echo and Saline Contrast Bubble Study Indications:    stroke  History:        Patient has prior history of Echocardiogram examinations, most                 recent 05/06/2022. Stroke.  Sonographer:     Cathie Hoops Referring Phys: [id number] Malachi Carl STACK IMPRESSIONS  1. Limited bubble study for stroke  2. Left ventricular ejection fraction, by estimation, is 60 to 65%. The left ventricle has normal function.  3. Agitated saline contrast bubble study was negative, with no evidence of any interatrial shunt. Comparison(s): 05/06/2022: LVEF 60-65%. FINDINGS  Left Ventricle: Left ventricular ejection fraction, by estimation, is 60 to 65%. The left ventricle has normal function. IAS/Shunts: No atrial level shunt detected by color flow Doppler. Agitated saline contrast was given intravenously to evaluate for intracardiac shunting. Agitated saline contrast bubble study was negative, with no evidence of any interatrial shunt. [name] MD Electronically signed by [name] MD Signature Date/Time: 05/07/2022/2:51:35 PM    Final     Anti-infectives: Anti-infectives (From admission, onward)    Start  Dose/Rate Route Frequency Ordered Stop   05/05/22 1315  doxycycline (VIBRA-TABS) tablet 100 mg        100 mg Oral Every 12 hours 05/05/22 1223     05/04/22 2200  metroNIDAZOLE (FLAGYL) IVPB 500 mg        500 mg 100 mL/hr over 60 Minutes Intravenous Every 12 hours 05/04/22 1434     05/04/22 1530  metroNIDAZOLE (FLAGYL) IVPB 500 mg        500 mg 100 mL/hr over 60 Minutes Intravenous  Once 05/04/22 1434 05/04/22 1645   05/04/22 1530  ciprofloxacin (CIPRO) IVPB 400 mg        400 mg 200 mL/hr over 60 Minutes Intravenous 2 times daily 05/04/22 1440     05/04/22 1515  metroNIDAZOLE (FLAGYL) IVPB 500 mg  Status:  Discontinued        500 mg 100 mL/hr over 60 Minutes Intravenous Every 12 hours 05/04/22 1428 05/04/22 1434       Assessment/Plan: RLQ abdominal pain Fever Pelvic abscesses  36 year old female who presents with 1 week of progressively worsening right lower quadrant abdominal pain, fever, vomiting.  CT scan on the date of admission, 9/8, showed fibrofatty infiltration of the terminal ileum and  colon.  CTA abdomen performed 9/11 shows development of new fluid and inflammatory changes in the lower abdomen and pelvis - No indication for emergency surgery - Agree with IV abx - Nothing drainable at this time per IR. Monitor to see if she will need a repeat scan in the coming days  - Constellation of symptoms, her significant family history of Crohn's and CT findings are most suspicious for significant Crohn's disease. GI following. Another possible etiology is PID for which GYN has seen for, recommended Cipro/Flagyl + Doxy x 10d course. GYN ok w/ steroids if GI felt they were indicated - CCS will follow nothing new to add.  Continue present care   FEN - diet as tolerated VTE - SCD's, ok for DVT ppx from surgical perspective. Now on ASA ID - Cipro/flagyl/Doxy (severe PCN allergy).     - Per [facility] -  New CVA - neuro to see, on asa GLF HTN urgency Alcohol dependence Polysubstance abuse    LOS: 7 days   Total time 30 minutes for chart review, face-to-face time, discussion of care plan, and documentation Marcello Moores A Nevaan Bunton md 05/09/2022

## 2022-05-09 NOTE — Consult Note (Signed)
Follow up Gyn consult note:  I have followed patient's clinical course via Epic The Gen probe was negative for Gc and chlamydia but was obtained post antibiotics so recommend finishing a 10 day course of the current antibiotics, including doxycycline, again for empiric therapy for possible endometritis/PID  Fever curve resolved and WBC improved on this therapy  Clinically this is unlikely to be PID related given tubal and lab/scan findings, but do recommend finishing a 10 day course to ensure appropriate coverage  As far as GYN is concerned, no need to rescan unless otherwise clinically indicated  We will continue to evaluate clinical course and add any new recommendations if clinically indicated  Florian Buff, MD 05/09/2022 9:18 AM

## 2022-05-09 NOTE — Progress Notes (Signed)
Mobility Specialist Cancellation/Refusal Note:    05/09/22 1448  Mobility  Activity Refused mobility     Reason for Cancellation/Refusal: Pt declined mobility at this time. Pt not feeling well. Will check back as schedule permits.       [facility]

## 2022-05-09 NOTE — Progress Notes (Signed)
[facility]                                                                              [name], is a 36 y.o. female, DOB - 03/04/1986, [id number] Admit date - 05/01/2022    Outpatient Primary MD for the patient is Patient, No Pcp Per  LOS - 7  days  Chief Complaint  Patient presents with   Abdominal Pain       Brief summary   [name] is a 36 y.o. female with medical history significant of anxiety, liver cirrhosis, cocaine abuse, hypertension, unspecified renal disorder, history of other nonhemorrhagic CVA, ventral hernia, constipation who is coming to the emergency department due to abdominal pain associated with nausea, dry heaving and constipation for the past 3 days.  On presentation to the emergency department, patient was hypertensive with BP up to 189/128.  She was started on nicardipine drip.  CT abdomen pelvis as well as pelvic ultrasound was completed without clear etiology of abdominal pain found.  Due to fevers and continued abdominal pain, CTA abdomen pelvis was done which revealed fluid and inflammatory changes in the lower abdomen and pelvis, new small complex fluid collections in the pelvis.  Patient was started on antibiotics and general surgery consulted.  Hospital course complicated by multiple punctate CVAs in the setting of blood pressure fluctuations   Significant events: 9/8-admission 9/11-persistent fever, CTA shows new small complex fluid collection in the pelvis, general surgery consulted 9/12-GI consulted due to concern for Crohn's 9/13-MRI of the brain showing multiple areas of restricted diffusion compatible with acute infarcts in the right pons, left putamen, right thalamus, corpus callosum and left frontal white matter.   Assessment & Plan      Principal problem Intra-abdominal infection -Presented with nausea vomiting abdominal pain.  Initial CT abdomen pelvis and pelvic ultrasound unremarkable. -Repeat CTA  showed fluid and inflammatory changes in the lower abdomen and pelvis, new small complex fluid collections in pelvis.  Source unclear, GI versus PID -GI, surgery, GYN consulted, placed on Cipro, Flagyl, doxycycline x 10 days for possible PID -Improving fever curve, leukocytosis, negative GC and chlamydia (obtained post antibiotics).  GYN recommended finishing the 10-day course for possible endometritis/PID, no need to rescan unless otherwise clinically indicated -Per surgery, no indication for acute intervention, monitor to assess if repeat scan in the coming days. -Pain improving    Active problems Acute multiple punctate CVAs -On 9/13, patient reported right-sided heaviness, unclear onset, able to ambulate and inconsistent neuro exam. -MRI brain showed multiple areas of small infarcts involving right pons, left abdomen, right thalamus, corpus callosum and right frontal white matter, possibly due to fluctuation in her BP.  On admission systolic BP was in [id number], initially requiring Cardene drip. -Stroke work-up completed, CTA showed no significant LVO, new severe distal left A2 stenosis. -2D echo showed EF 60 to 65%, no WMA, LVH, G2 DD -Hemoglobin A1c 5.4, LDL 84 -Neurology recommended aspirin and Plavix for 90 days followed by aspirin alone -If no plans for colonoscopy on Monday, will start Plavix.  Per surgery, no plans for surgical intervention   Hypertensive urgency  without organ failure -Initially required Cardene drip, BP fluctuates -Continue labetalol as needed, subsequently oral regimen was placed on hold due to acute CVA -BP 173/104 this morning, resume amlodipine 5 mg daily, hold off on Catapres   Alcohol dependence -Completed Librium taper, currently stable, no acute withdrawals   Substance abuse -UDS negative for cocaine, admitted last cocaine was 2 weeks ago, positive for opiates   Nicotine use -Continue nicotine patch  Obesity  Estimated body mass index is 31 kg/m as  calculated from the following:   Height as of this encounter: 5' (1.524 m).   Weight as of this encounter: 72 kg.  Code Status: Full code DVT Prophylaxis:  SCDs Start: 05/01/22 1512   Level of Care: Level of care: Telemetry Family Communication: Updated patient's mother at bedside    Disposition Plan:      Remains inpatient appropriate:  severity of illness    Procedures:  None  Consultants:   GI GYN General surgery Neurology  Antimicrobials:   Anti-infectives (From admission, onward)    Start     Dose/Rate Route Frequency Ordered Stop   05/05/22 1315  doxycycline (VIBRA-TABS) tablet 100 mg        100 mg Oral Every 12 hours 05/05/22 1223     05/04/22 2200  metroNIDAZOLE (FLAGYL) IVPB 500 mg        500 mg 100 mL/hr over 60 Minutes Intravenous Every 12 hours 05/04/22 1434     05/04/22 1530  metroNIDAZOLE (FLAGYL) IVPB 500 mg        500 mg 100 mL/hr over 60 Minutes Intravenous  Once 05/04/22 1434 05/04/22 1645   05/04/22 1530  ciprofloxacin (CIPRO) IVPB 400 mg        400 mg 200 mL/hr over 60 Minutes Intravenous 2 times daily 05/04/22 1440     05/04/22 1515  metroNIDAZOLE (FLAGYL) IVPB 500 mg  Status:  Discontinued        500 mg 100 mL/hr over 60 Minutes Intravenous Every 12 hours 05/04/22 1428 05/04/22 1434          Medications  aspirin EC  81 mg Oral Daily   atorvastatin  40 mg Oral Daily   Chlorhexidine Gluconate Cloth  6 each Topical Daily   docusate sodium  100 mg Oral BID   doxycycline  100 mg Oral Y10F   folic acid  1 mg Oral Daily   metoCLOPramide (REGLAN) injection  10 mg Intravenous Q6H   multivitamin with minerals  1 tablet Oral Daily   nicotine  21 mg Transdermal Daily   pantoprazole  40 mg Oral BID AC   thiamine  100 mg Oral Daily      Subjective:   [name] was seen and examined today.  Complaining of persistent abdominal pain however better this morning.  No chest pain or shortness of breath.  No fevers.   Objective:   Vitals:    05/08/22 1237 05/08/22 2111 05/09/22 0620 05/09/22 0841  BP: 127/77 (!) 150/92 139/82 (!) 173/104  Pulse: 79 73 71 78  Resp: 18 18 18 18   Temp: 98.3 F (36.8 C) (!) 97.5 F (36.4 C) 98 F (36.7 C) 98.3 F (36.8 C)  TempSrc: Oral Oral Oral Oral  SpO2: 95% 96% 95% 98%  Weight:      Height:        Intake/Output Summary (Last 24 hours) at 05/09/2022 1153 Last data filed at 05/09/2022 0447 Gross per 24 hour  Intake 774 ml  Output --  Net 774 ml     Wt Readings from Last 3 Encounters:  05/01/22 72 kg  03/20/20 68.9 kg  08/04/18 65.8 kg     Exam General: Alert and oriented x 3, NAD Cardiovascular: S1 S2 auscultated,  RRR Respiratory: Clear to auscultation bilaterally, no wheezing Gastrointestinal: Soft, mild diffuse TTP, nondistended, + bowel sounds Ext: no pedal edema bilaterally Neuro: no new FND's Psych: Normal affect and demeanor, alert and oriented x3     Data Reviewed:  I have personally reviewed following labs    CBC Lab Results  Component Value Date   WBC 5.6 05/08/2022   RBC 3.36 (L) 05/08/2022   HGB 10.2 (L) 05/08/2022   HCT 31.8 (L) 05/08/2022   MCV 94.6 05/08/2022   MCH 30.4 05/08/2022   PLT 327 05/08/2022   MCHC 32.1 05/08/2022   RDW 15.4 05/08/2022   LYMPHSABS 2.3 03/20/2020   MONOABS 0.6 03/20/2020   EOSABS 0.3 03/20/2020   BASOSABS 0.1 [id number]     Last metabolic panel Lab Results  Component Value Date   NA 138 05/08/2022   K 3.4 (L) 05/08/2022   CL 106 05/08/2022   CO2 25 05/08/2022   BUN 10 05/08/2022   CREATININE 0.50 05/08/2022   GLUCOSE 101 (H) 05/08/2022   GFRNONAA >60 05/08/2022   GFRAA >60 05/09/2020   CALCIUM 8.7 (L) 05/08/2022   PHOS 4.0 05/07/2022   PROT 6.2 (L) 05/08/2022   ALBUMIN 2.5 (L) 05/08/2022   BILITOT 0.4 05/08/2022   ALKPHOS 76 05/08/2022   AST 12 (L) 05/08/2022   ALT 12 05/08/2022   ANIONGAP 7 05/08/2022     Radiology Studies: I have personally reviewed the imaging studies  ECHOCARDIOGRAM  LIMITED BUBBLE STUDY  Result Date: 05/07/2022    ECHOCARDIOGRAM LIMITED COLLEEN M STACK IMPRESSIONS  1. Limited bubble study for stroke  2. Left ventricular ejection fraction, by estimation, is 60 to 65%. The left ventricle has normal function.  3. Agitated saline contrast bubble study was negative, with no evidence of any interatrial shunt. Comparison(s): 05/06/2022: LVEF 60-65%. FINDINGS  Left Ventricle: Left ventricular ejection fraction, by estimation, is 60 to 65%. The left ventricle has normal function. IAS/Shunts: No atrial level shunt detected by color flow Doppler. Agitated saline contrast was given intravenously to evaluate for intracardiac shunting. Agitated saline contrast bubble study was negative, with no evidence of any interatrial shunt. Lyman Bishop MD Electronically signed by Lyman Bishop MD Signature Date/Time: 05/07/2022/2:51:35 PM    Final        Estill Cotta M.D. [facility] 05/09/2022, 11:53 AM  Available via Epic secure chat 7am-7pm After 7 pm, please refer to night coverage provider listed on amion.

## 2022-05-09 NOTE — Progress Notes (Addendum)
   05/09/22 1723  What Happened  Was fall witnessed? No  Was patient injured? No  Patient found other (Comment) (pt reported fall to nurse approx. 1 hour after happening)  Found by No one-pt stated they fell  Stated prior activity ambulating-unassisted  Follow Up  MD notified Dr. Tana Coast  Time MD notified [phone]  Family notified No - patient refusal  Progress note created (see row info) Yes  Adult Fall Risk Assessment  Risk Factor Category (scoring not indicated) Fall has occurred during this admission (document High fall risk)  Patient Fall Risk Level High fall risk  Adult Fall Risk Interventions  Required Bundle Interventions *See Row Information* High fall risk - low, moderate, and high requirements implemented  Additional Interventions Use of appropriate toileting equipment (bedpan, BSC, etc.)  Screening for Fall Injury Risk (To be completed on HIGH fall risk patients) - Assessing Need for Floor Mats  Risk For Fall Injury- Criteria for Floor Mats Noncompliant with safety precautions  Will Implement Floor Mats Yes  Vitals  Temp 98.3 F (36.8 C)  Temp Source Oral  BP (!) 172/99  MAP (mmHg) 122  BP Location Left Arm  BP Method Automatic  Patient Position (if appropriate) Lying  Pulse Rate 86  Pulse Rate Source Dinamap  Resp 20  Oxygen Therapy  SpO2 97 %  O2 Device Room Air  Pain Assessment  Pain Scale 0-10  Pain Score 10  Pain Type Acute pain  Pain Location Abdomen  Pain Descriptors / Indicators Aching  Pain Frequency Constant  Pain Onset On-going  Patients Stated Pain Goal 3  Pain Intervention(s) Medication (See eMAR);Emotional support  Multiple Pain Sites No  PCA/Epidural/Spinal Assessment  Respiratory Pattern Regular;Unlabored  Neurological  Neuro (WDL) X  Level of Consciousness Alert  Orientation Level Oriented X4  Cognition Impulsive;Poor attention/concentration  Speech Clear  Musculoskeletal  Musculoskeletal (WDL) X  Assistive Device BSC  Generalized  Weakness Yes  Weight Bearing Restrictions No  Integumentary  Integumentary (WDL) X  Skin Color Appropriate for ethnicity  Skin Condition Dry  Skin Integrity Ecchymosis  Ecchymosis Location Arm;Hand  Ecchymosis Location Orientation Bilateral   Pt is known to be noncompliant with safety measures that staff have in place for fall prevention. Pt continuously gets out of bed without assistance setting off bed alarm. Staff have also witnessed patient turning off bed alarm when getting out of bed without assist. Pt educated repeatedly on the importance of calling for help and verbalizes understanding.

## 2022-05-10 ENCOUNTER — Inpatient Hospital Stay (HOSPITAL_COMMUNITY): Payer: Self-pay

## 2022-05-10 DIAGNOSIS — K760 Fatty (change of) liver, not elsewhere classified: Secondary | ICD-10-CM

## 2022-05-10 DIAGNOSIS — F1410 Cocaine abuse, uncomplicated: Secondary | ICD-10-CM

## 2022-05-10 LAB — BASIC METABOLIC PANEL
Anion gap: 6 (ref 5–15)
BUN: 10 mg/dL (ref 6–20)
CO2: 27 mmol/L (ref 22–32)
Calcium: 8.9 mg/dL (ref 8.9–10.3)
Chloride: 104 mmol/L (ref 98–111)
Creatinine, Ser: 0.64 mg/dL (ref 0.44–1.00)
GFR, Estimated: >60 mL/min (ref >60)
Glucose, Bld: 99 mg/dL (ref 70–99)
Potassium: 4 mmol/L (ref 3.5–5.1)
Sodium: 137 mmol/L (ref 135–145)

## 2022-05-10 LAB — CBC
HCT: 35 % — ABNORMAL LOW (ref 36.0–46.0)
Hemoglobin: 11 g/dL — ABNORMAL LOW (ref 12.0–15.0)
MCH: 30 pg (ref 26.0–34.0)
MCHC: 31.4 g/dL (ref 30.0–36.0)
MCV: 95.4 fL (ref 80.0–100.0)
Platelets: 644 10*3/uL — ABNORMAL HIGH (ref 150–400)
RBC: 3.67 MIL/uL — ABNORMAL LOW (ref 3.87–5.11)
RDW: 15.3 % (ref 11.5–15.5)
WBC: 5.3 10*3/uL (ref 4.0–10.5)
nRBC: 0 % (ref 0.0–0.2)

## 2022-05-10 MED ORDER — HYDROMORPHONE HCL 1 MG/ML IJ SOLN: 0.5000 mg | INTRAMUSCULAR | Status: DC | PRN

## 2022-05-10 MED ORDER — TRAMADOL HCL 50 MG PO TABS
50.0000 mg | ORAL_TABLET | Freq: Three times a day (TID) | ORAL | Status: DC | PRN
  Administered 2022-05-10 – 2022-05-11 (×2): 50 mg via ORAL
  Filled 2022-05-10 (×3): qty 1

## 2022-05-10 MED ORDER — HYDROMORPHONE HCL 1 MG/ML IJ SOLN
0.5000 mg | INTRAMUSCULAR | Status: DC | PRN
  Administered 2022-05-10 – 2022-05-11 (×7): 0.5 mg via INTRAVENOUS
  Filled 2022-05-10 (×9): qty 0.5

## 2022-05-10 MED ORDER — IOHEXOL 300 MG/ML  SOLN
100.0000 mL | Freq: Once | INTRAMUSCULAR | Status: AC | PRN
  Administered 2022-05-10: 100 mL via INTRAVENOUS

## 2022-05-10 MED ORDER — IOHEXOL 9 MG/ML PO SOLN
ORAL | Status: AC
  Administered 2022-05-10: 500 mL
  Filled 2022-05-10: qty 1000

## 2022-05-10 NOTE — Progress Notes (Signed)
Subjective/Chief Complaint: Up to the bedside commode    Objective: Vital signs in last 24 hours: Temp:  [98.2 F (36.8 C)-98.4 F (36.9 C)] 98.2 F (36.8 C) (09/16 2006) Pulse Rate:  [73-86] 83 (09/16 2006) Resp:  [18-20] 20 (09/16 2006) BP: (142-172)/(81-99) 144/94 (09/16 2006) SpO2:  [93 %-97 %] 95 % (09/16 2006) Last BM Date : 05/08/22  Intake/Output from previous day: 09/16 0701 - 09/17 0700 In: 1234.8 [P.O.:437; IV Piggyback:797.8] Out: 980 [Urine:980] Intake/Output this shift: No intake/output data recorded.  NAD Abdomen  no significant change - moderate diffuse tenderness   Lab Results:  Recent Labs    05/08/22 0452 05/10/22 0700  WBC 5.6 5.3  HGB 10.2* 11.0*  HCT 31.8* 35.0*  PLT 327 644*   BMET Recent Labs    05/08/22 0452 05/10/22 0700  NA 138 137  K 3.4* 4.0  CL 106 104  CO2 25 27  GLUCOSE 101* 99  BUN 10 10  CREATININE 0.50 0.64  CALCIUM 8.7* 8.9   PT/INR No results for input(s): "LABPROT", "INR" in the last 72 hours. ABG No results for input(s): "PHART", "HCO3" in the last 72 hours.  Invalid input(s): "PCO2", "PO2"  Studies/Results: No results found.  Anti-infectives: Anti-infectives (From admission, onward)    Start     Dose/Rate Route Frequency Ordered Stop   05/05/22 1315  doxycycline (VIBRA-TABS) tablet 100 mg        100 mg Oral Every 12 hours 05/05/22 1223     05/04/22 2200  metroNIDAZOLE (FLAGYL) IVPB 500 mg        500 mg 100 mL/hr over 60 Minutes Intravenous Every 12 hours 05/04/22 1434     05/04/22 1530  metroNIDAZOLE (FLAGYL) IVPB 500 mg        500 mg 100 mL/hr over 60 Minutes Intravenous  Once 05/04/22 1434 05/04/22 1645   05/04/22 1530  ciprofloxacin (CIPRO) IVPB 400 mg        400 mg 200 mL/hr over 60 Minutes Intravenous 2 times daily 05/04/22 1440     05/04/22 1515  metroNIDAZOLE (FLAGYL) IVPB 500 mg  Status:  Discontinued        500 mg 100 mL/hr over 60 Minutes Intravenous Every 12 hours 05/04/22 1428  05/04/22 1434       Assessment/Plan: RLQ abdominal pain Fever Pelvic abscesses  36 year old female who presents with 1 week of progressively worsening right lower quadrant abdominal pain, fever, vomiting.  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  CTA abdomen performed 9/11 shows development of new fluid and inflammatory changes in the lower abdomen and pelvis - No indication for emergency surgery - Agree with IV abx - Nothing drainable at this time per IR. Monitor to see if she will need a repeat scan in the coming days  - Constellation of symptoms, her significant family history of Crohn's and CT findings are most suspicious for significant Crohn's disease. GI following. Another possible etiology is PID for which GYN has seen for, recommended Cipro/Flagyl + Doxy x 10d course. GYN ok w/ steroids if GI felt they were indicated - CCS will follow nothing new to add.  Continue present care   FEN - diet as tolerated VTE - SCD's, ok for DVT ppx from surgical perspective. Now on ASA ID - Cipro/flagyl/Doxy (severe PCN allergy).     - Per [facility] -  New CVA - neuro to see, on asa GLF HTN urgency Alcohol  dependence Polysubstance abuse    LOS: 8 days  Total time 10   Tavaria Mackins A Jasaiah Karwowski MD  05/10/2022

## 2022-05-10 NOTE — Progress Notes (Signed)
[name] 10:42 AM  Subjective: Patient seen and examined and she is doing about the same has no new complaints and I still think she is overmedicated and unfortunately her CT was not done yesterday  Objective: Vital signs stable afebrile no acute distress abdomen is a little tender not consistent no guarding or rebound labs stable normal white count  Assessment: Abdominal pain questionable l etiology  Plan: Decrease pain medicine per primary team repeat CT consider colonoscopy next from our standpoint based on those findings  [facility]  office [phone] After 5PM or if no answer call [phone]

## 2022-05-10 NOTE — Progress Notes (Signed)
[facility]                                                                              [name], is a 36 y.o. female, DOB - [birth year], OVF:[id number] Admit date - 05/01/2022    Outpatient Primary MD for the [name] is [name], No Pcp Per  LOS - 8  days  Chief Complaint  [name] presents with   Abdominal Pain       Brief summary   [name] is a 36 y.o. female with medical history significant of anxiety, liver cirrhosis, cocaine abuse, hypertension, unspecified renal disorder, history of other nonhemorrhagic CVA, ventral hernia, constipation who is coming to the emergency department due to abdominal pain associated with nausea, dry heaving and constipation for the past 3 days.  On presentation to the emergency department, [name] was hypertensive with BP up to 189/128.  She was started on nicardipine drip.  CT abdomen pelvis as well as pelvic ultrasound was completed without clear etiology of abdominal pain found.  Due to fevers and continued abdominal pain, CTA abdomen pelvis was done which revealed fluid and inflammatory changes in the lower abdomen and pelvis, new small complex fluid collections in the pelvis.  [name] was started on antibiotics and general surgery consulted.  Hospital course complicated by multiple punctate CVAs in the setting of blood pressure fluctuations   Significant events: 9/8-admission 9/11-persistent fever, CTA shows new small complex fluid collection in the pelvis, general surgery consulted 9/12-GI consulted due to concern for Crohn's 9/13-MRI of the brain showing multiple areas of restricted diffusion compatible with acute infarcts in the right pons, left putamen, right thalamus, corpus callosum and left frontal white matter. 9/17: Plan for repeat CT abdomen today   Assessment & Plan      Principal problem Intra-abdominal infection -Presented with nausea vomiting abdominal pain.  Initial CT abdomen pelvis and pelvic  ultrasound unremarkable. -Repeat CTA showed fluid and inflammatory changes in the lower abdomen and pelvis, new small complex fluid collections in pelvis.  Source unclear, GI versus PID -GI, surgery, GYN consulted, placed on Cipro, Flagyl, doxycycline x 10 days for possible PID -Improving fever curve,no leukocytosis, negative GC and chlamydia (obtained post antibiotics).  GYN recommended finishing the 10-day course for possible endometritis/PID -Per surgery, no indication for acute intervention, no drainable collection -GI following, plan for repeat CT abd-pelvis today -Somnolent but arousable, talking about Dilaudid and pain medications.  I have decreased Dilaudid to 0.5 mg q4 hours PRN, decrease tramadol to 50 mg q8 hours as needed for pain.  She had also received Atarax this morning at 4:48 AM.    Active problems Acute multiple punctate CVAs -On 9/13, [name] reported right-sided heaviness, unclear onset, able to ambulate and inconsistent neuro exam. -MRI brain showed multiple areas of small infarcts involving right pons, left abdomen, right thalamus, corpus callosum and right frontal white matter, possibly due to fluctuation in her BP.  On admission systolic BP was in 200s, initially requiring Cardene drip. -Stroke work-up completed, CTA showed no significant LVO, new severe distal left A2 stenosis. -2D echo showed EF 60 to 65%, no WMA, LVH, G2  DD -Hemoglobin A1c 5.4, LDL 84 -Neurology recommended aspirin and Plavix for 90 days followed by aspirin alone -If no plans for colonoscopy on Monday, will start Plavix.  Per surgery, no plans for surgical intervention   Hypertensive urgency without organ failure -Initially required Cardene drip, BP fluctuates -Continue labetalol as needed, subsequently oral regimen was placed on hold due to acute CVA -Resumed amlodipine 5 mg daily  Alcohol dependence -Completed Librium taper, currently stable, no acute withdrawals   Substance abuse -UDS  negative for cocaine, admitted last cocaine was 2 weeks ago, positive for opiates   Nicotine use -Continue nicotine patch  Obesity  Estimated body mass index is 31 kg/m as calculated from the following:   Height as of this encounter: 5' (1.524 m).   Weight as of this encounter: 72 kg.  Code Status: Full code DVT Prophylaxis:  SCDs Start: 05/01/22 1512   Level of Care: Level of care: Telemetry Family Communication: Updated [name]'s mother at bedside yesterday   Disposition Plan:      Remains inpatient appropriate:  severity of illness    Procedures:  None  Consultants:   GI GYN General surgery Neurology  Antimicrobials:   Anti-infectives (From admission, onward)    Start     Dose/Rate Route Frequency Ordered Stop   05/05/22 1315  doxycycline (VIBRA-TABS) tablet 100 mg        100 mg Oral Every 12 hours 05/05/22 1223     05/04/22 2200  metroNIDAZOLE (FLAGYL) IVPB 500 mg        500 mg 100 mL/hr over 60 Minutes Intravenous Every 12 hours 05/04/22 1434     05/04/22 1530  metroNIDAZOLE (FLAGYL) IVPB 500 mg        500 mg 100 mL/hr over 60 Minutes Intravenous  Once 05/04/22 1434 05/04/22 1645   05/04/22 1530  ciprofloxacin (CIPRO) IVPB 400 mg        400 mg 200 mL/hr over 60 Minutes Intravenous 2 times daily 05/04/22 1440     05/04/22 1515  metroNIDAZOLE (FLAGYL) IVPB 500 mg  Status:  Discontinued        500 mg 100 mL/hr over 60 Minutes Intravenous Every 12 hours 05/04/22 1428 05/04/22 1434          Medications  amLODipine  5 mg Oral Daily   aspirin EC  81 mg Oral Daily   atorvastatin  40 mg Oral Daily   docusate sodium  100 mg Oral BID   doxycycline  100 mg Oral S50N   folic acid  1 mg Oral Daily   metoCLOPramide (REGLAN) injection  10 mg Intravenous Q6H   multivitamin with minerals  1 tablet Oral Daily   nicotine  21 mg Transdermal Daily   pantoprazole  40 mg Oral BID AC   thiamine  100 mg Oral Daily      Subjective:   [name] was seen and  examined today.  Somewhat somnolent but easily arousable and oriented.  Does not appear to have any significant pain on exam.  No fevers, chest pain or shortness of breath.   Objective:   Vitals:   05/09/22 0841 05/09/22 1428 05/09/22 1723 05/09/22 2006  BP: (!) 173/104 (!) 142/81 (!) 172/99 (!) 144/94  Pulse: 78 73 86 83  Resp: 18 18 20 20   Temp: 98.3 F (36.8 C) 98.4 F (36.9 C) 98.3 F (36.8 C) 98.2 F (36.8 C)  TempSrc: Oral Oral Oral Oral  SpO2: 98% 93% 97% 95%  Weight:  Height:        Intake/Output Summary (Last 24 hours) at 05/10/2022 1202 Last data filed at 05/10/2022 0700 Gross per 24 hour  Intake 1234.76 ml  Output 980 ml  Net 254.76 ml     Wt Readings from Last 3 Encounters:  05/01/22 72 kg  03/20/20 68.9 kg  08/04/18 65.8 kg   Physical Exam General: Somnolent but easily arousable Cardiovascular: S1 S2 clear, RRR.  Respiratory: CTAB, no wheezing, rales Gastrointestinal: Soft, mild diffuse TTP , nondistended, NBS Ext: no pedal edema bilaterally Neuro: moving all 4 extremities spontaneously Psych: somewhat somnolent    Data Reviewed:  I have personally reviewed following labs    CBC Lab Results  Component Value Date   WBC 5.3 05/10/2022   RBC 3.67 (L) 05/10/2022   HGB 11.0 (L) 05/10/2022   HCT 35.0 (L) 05/10/2022   MCV 95.4 05/10/2022   MCH 30.0 05/10/2022   PLT 644 (H) 05/10/2022   MCHC 31.4 05/10/2022   RDW 15.3 05/10/2022   LYMPHSABS 2.3 03/20/2020   MONOABS 0.6 03/20/2020   EOSABS 0.3 03/20/2020   BASOSABS 0.1 03/20/2020     Last metabolic panel Lab Results  Component Value Date   NA 137 05/10/2022   K 4.0 05/10/2022   CL 104 05/10/2022   CO2 27 05/10/2022   BUN 10 05/10/2022   CREATININE 0.64 05/10/2022   GLUCOSE 99 05/10/2022   GFRNONAA >60 05/10/2022   GFRAA >60 05/09/2020   CALCIUM 8.9 05/10/2022   PHOS 4.0 05/07/2022   PROT 6.2 (L) 05/08/2022   ALBUMIN 2.5 (L) 05/08/2022   BILITOT 0.4 05/08/2022   ALKPHOS 76  05/08/2022   AST 12 (L) 05/08/2022   ALT 12 05/08/2022   ANIONGAP 6 05/10/2022     Radiology Studies: I have personally reviewed the imaging studies  ECHOCARDIOGRAM LIMITED BUBBLE STUDY  Result Date: 05/07/2022    ECHOCARDIOGRAM LIMITED COLLEEN M STACK IMPRESSIONS  1. Limited bubble study for stroke  2. Left ventricular ejection fraction, by estimation, is 60 to 65%. The left ventricle has normal function.  3. Agitated saline contrast bubble study was negative, with no evidence of any interatrial shunt. Comparison(s): 05/06/2022: LVEF 60-65%. FINDINGS  Left Ventricle: Left ventricular ejection fraction, by estimation, is 60 to 65%. The left ventricle has normal function. IAS/Shunts: No atrial level shunt detected by color flow Doppler. Agitated saline contrast was given intravenously to evaluate for intracardiac shunting. Agitated saline contrast bubble study was negative, with no evidence of any interatrial shunt. [name] MD Electronically signed by [name] MD Signature Date/Time: 05/07/2022/2:51:35 PM    Final        Thad Ranger M.D. [facility] 05/10/2022, 12:02 PM  Available via Epic secure chat 7am-7pm After 7 pm, please refer to night coverage provider listed on amion.

## 2022-05-11 ENCOUNTER — Other Ambulatory Visit (HOSPITAL_COMMUNITY): Admission: RE | Admit: 2022-05-11 | Payer: Self-pay | Source: Ambulatory Visit

## 2022-05-11 DIAGNOSIS — N739 Female pelvic inflammatory disease, unspecified: Secondary | ICD-10-CM

## 2022-05-11 LAB — HEPATITIS B SURFACE ANTIGEN: Hepatitis B Surface Ag: NONREACTIVE

## 2022-05-11 LAB — HEPATITIS B CORE ANTIBODY, TOTAL: Hep B Core Total Ab: NONREACTIVE

## 2022-05-11 MED ORDER — CHLORDIAZEPOXIDE HCL 5 MG PO CAPS: 5.0000 mg | ORAL_CAPSULE | Freq: Once | ORAL | Status: DC

## 2022-05-11 MED ORDER — SODIUM CHLORIDE 0.9 % IV SOLN
2.0000 g | INTRAVENOUS | Status: DC
  Administered 2022-05-11: 2 g via INTRAVENOUS
  Filled 2022-05-11: qty 20

## 2022-05-11 NOTE — Progress Notes (Signed)
PROGRESS NOTE  [name] YJE:[id number] DOB: 1986/03/17 DOA: 05/01/2022 PCP: Patient, No Pcp Per   LOS: 9 days   Brief Narrative / Interim history: [name] is a 36 y.o. female with medical history significant of anxiety, liver cirrhosis, cocaine abuse, hypertension, unspecified renal disorder, history of other nonhemorrhagic CVA, ventral hernia, constipation who is coming to the emergency department due to abdominal pain associated with nausea, dry heaving and constipation for the past 3 days.  On presentation to the emergency department, patient was hypertensive with BP up to 189/128.  She was started on nicardipine drip.  CT abdomen pelvis as well as pelvic ultrasound was completed without clear etiology of abdominal pain found.  Due to fevers and continued abdominal pain, CTA abdomen pelvis was done which revealed fluid and inflammatory changes in the lower abdomen and pelvis, new small complex fluid collections in the pelvis.  Patient was started on antibiotics and general surgery consulted.  Hospital course complicated by multiple punctate CVAs in the setting of blood pressure fluctuations  Significant events: 9/8-admission 9/11-persistent fever, CTA shows new small complex fluid collection in the pelvis, general surgery consulted 9/12-GI consulted due to concern for Crohn's 9/13-MRI of the brain showing multiple areas of restricted diffusion compatible with acute infarcts in the right pons, left putamen, right thalamus, corpus callosum and left frontal white matter.  Significant imaging / results / micro data: CT abdomen pelvis, pelvic ultrasound 9/8-no significant acute findings CTA 9/11-new fluid and inflammatory changes in the lower abdomen and pelvis next to the uterus, loops of bowel without clear source.  This represents enteritis, colitis and possibly PID.  New small complex fluid collection in the pelvis, early versus developing small abscess. Blood cultures 9/10-no  growth to date, pending CT abdomen pelvis 9/17-multiple rim-enhancing fluid collections with inflammatory changes in the bilateral adnexa appears similar but slightly enlarged, 2 new fluid collections appeared.  Findings favored to reflect PID  Subjective / 24h Interval events: Has persistent abdominal pain.  No nausea or vomiting.  Assesement and Plan: Principal Problem:   Hypertensive urgency Active Problems:   Alcohol withdrawal (HCC)   Substance abuse (HCC)   Hepatic steatosis   Abdominal pain   Nausea and vomiting   Cocaine abuse (HCC)   Alcohol dependence (HCC)   Hypokalemia   Principal problem Intra-abdominal infection-she initially presented with abdominal pain, nausea, vomiting, initial work-up with a CT abdomen and pelvis and a pelvic ultrasound was unremarkable.  Due to persistent pain and fever, underwent a repeat CT angiogram which showed fluid and inflammatory changes in the lower abdomen and pelvis, new small complex fluid collections in the pelvis.  Source for this inflammatory change is not clear, related to small bowel versus PID.  General surgery, gynecology as well as GI consulted.  She is currently being monitored on antibiotics with ciprofloxacin, metronidazole as well as doxycycline with plans for 10 days as for a PID case.   -Repeat CT scan last night shows persistent fluid collections, slightly increased, as well as new ones appearing.  ID consulted, IR as well to consider drainage  Active problems Acute multiple punctate CVAs-patient was complaining of right-sided heaviness on 9/13, unclear onset, but able to ambulate, and overall neuro exam was very inconsistent.  An MRI of the brain did however show multiple areas of small infarcts involving the right pons, left putamen, right thalamus, corpus callosum and left frontal white matter.  This is probably related to her fluctuation in her blood pressure.  On admission, her systolic was in the 200s initially requiring  Cardene drip.  Following that, she was placed on amlodipine, clonidine, hydralazine.  Given MRI findings these medications have been discontinued to allow permissive hypertension over the 48-72-hour post CVA. -Stroke work-up done, CTA without significant large vessel obstructions, but new severe distal left A2 stenosis.  2D echo shows LVEF 60-65%, no WMA, LVH, grade 2 diastolic dysfunction.  RV was normal.  Limited bubble study echo without shunt.  A1c is 5.4.  Lipid panel shows an LDL of 84.  Neurology recommends dual antiplatelet therapy with aspirin and Plavix for 90 days followed by aspirin alone. Have not initiated Plavix pending work-up of her intra-abdominal infection and whether she needs interventions or not -Back on her BP meds currently with amlodipine, blood pressure overall stable  Hypertensive urgency without organ failure -see discussion above  Alcohol dependence -status post a Librium taper.  No significant withdrawals   Substance abuse -UDS is negative for cocaine, admitted last cocaine use 2 weeks ago. UDS positive for opiate   Tobacco abuse -Nicotine patch  Hypokalemia-replete and continue to monitor   Scheduled Meds:  amLODipine  5 mg Oral Daily   aspirin EC  81 mg Oral Daily   atorvastatin  40 mg Oral Daily   docusate sodium  100 mg Oral BID   doxycycline  100 mg Oral Q12H   folic acid  1 mg Oral Daily   metoCLOPramide (REGLAN) injection  10 mg Intravenous Q6H   multivitamin with minerals  1 tablet Oral Daily   nicotine  21 mg Transdermal Daily   pantoprazole  40 mg Oral BID AC   thiamine  100 mg Oral Daily   Continuous Infusions:  cefTRIAXone (ROCEPHIN)  IV     metronidazole 500 mg (05/11/22 1110)   PRN Meds:.acetaminophen, HYDROmorphone (DILAUDID) injection, hydrOXYzine, labetalol, ondansetron **OR** ondansetron (ZOFRAN) IV, mouth rinse, polyethylene glycol, traMADol  Current Outpatient Medications  Medication Instructions   acetaminophen (TYLENOL) 325-650  mg, Oral, Every 6 hours PRN   chlordiazePOXIDE (LIBRIUM) 25 mg, Oral, 3 times daily PRN   cloNIDine (CATAPRES) 0.1 mg, Oral, 2 times daily   ibuprofen (ADVIL) 200-400 mg, Oral, Every 6 hours PRN   omeprazole (PRILOSEC) 40 mg, Oral, Daily   propranolol (INDERAL) 40 mg, Oral, 2 times daily    Diet Orders (From admission, onward)     Start     Ordered   05/09/22 1507  DIET SOFT Room service appropriate? Yes; Fluid consistency: Thin  Diet effective now       Question Answer Comment  Room service appropriate? Yes   Fluid consistency: Thin      05/09/22 1506            DVT prophylaxis: SCDs Start: 05/01/22 1512   Lab Results  Component Value Date   PLT 644 (H) 05/10/2022      Code Status: Full Code  Family Communication: Mother at bedside  Status is: Inpatient Remains inpatient appropriate because: Intra-abdominal infection work-up underway  Level of care: Telemetry  Consultants:  General surgery GI  Objective: Vitals:   05/10/22 1250 05/10/22 2031 05/11/22 0522 05/11/22 0909  BP: (!) 173/93 (!) 160/110 131/76 (!) 167/99  Pulse: 81 92 78   Resp: 16 20 20    Temp: 98.3 F (36.8 C) 98.4 F (36.9 C) 98.2 F (36.8 C)   TempSrc: Oral Oral Oral   SpO2: 93% 98% 94%   Weight:      Height:  Intake/Output Summary (Last 24 hours) at 05/11/2022 1259 Last data filed at 05/11/2022 0635 Gross per 24 hour  Intake 720 ml  Output 1600 ml  Net -880 ml    Wt Readings from Last 3 Encounters:  05/01/22 72 kg  03/20/20 68.9 kg  08/04/18 65.8 kg    Examination:  Constitutional: NAD Eyes: lids and conjunctivae normal, no scleral icterus ENMT: mmm Neck: normal, supple Respiratory: clear to auscultation bilaterally, no wheezing, no crackles. Normal respiratory effort.  Cardiovascular: Regular rate and rhythm, no murmurs / rubs / gallops. No LE edema. Abdomen: soft, minimal tenderness Neurologic: no focal deficits, equal strength    Data Reviewed: I have  independently reviewed following labs and imaging studies   CBC Recent Labs  Lab 05/06/22 0456 05/07/22 0528 05/08/22 0452 05/10/22 0700  WBC 6.9 5.5 5.6 5.3  HGB 11.3* 11.2* 10.2* 11.0*  HCT 35.9* 36.5 31.8* 35.0*  PLT 209 283 327 644*  MCV 97.6 97.6 94.6 95.4  MCH 30.7 29.9 30.4 30.0  MCHC 31.5 30.7 32.1 31.4  RDW 15.5 15.7* 15.4 15.3     Recent Labs  Lab 05/04/22 1315 05/05/22 0505 05/06/22 0456 05/07/22 0528 05/08/22 0452 05/10/22 0700  NA  --  134* 136 135 138 137  K  --  3.6 4.1 3.6 3.4* 4.0  CL  --  104 106 105 106 104  CO2  --  23 21* GLUCOSE  --  133* 98 91 101* 99  BUN  --  23* CREATININE  --  0.85 0.69 0.60 0.50 0.64  CALCIUM  --  8.6* 8.8* 8.7* 8.7* 8.9  AST  --   --   --  10* 12*  --   ALT  --   --   --  11 12  --   ALKPHOS  --   --   --  71 76  --   BILITOT  --   --   --  0.7 0.4  --   ALBUMIN  --   --   --  2.7* 2.5*  --   MG  --   --   --  1.8 1.8  --   LATICACIDVEN 1.0  --   --   --   --   --   HGBA1C  --   --   --  5.4  --   --      ------------------------------------------------------------------------------------------------------------------ No results for input(s): "CHOL", "HDL", "LDLCALC", "TRIG", "CHOLHDL", "LDLDIRECT" in the last 72 hours.   Lab Results  Component Value Date   HGBA1C 5.4 05/07/2022   ------------------------------------------------------------------------------------------------------------------ No results for input(s): "TSH", "T4TOTAL", "T3FREE", "THYROIDAB" in the last 72 hours.  Invalid input(s): "FREET3"  Cardiac Enzymes No results for input(s): "CKMB", "TROPONINI", "MYOGLOBIN" in the last 168 hours.  Invalid input(s): "CK" ------------------------------------------------------------------------------------------------------------------ No results found for: "BNP"  CBG: No results for input(s): "GLUCAP" in the last 168 hours.  Recent Results (from the past 240 hour(s))  MRSA  Next Gen by PCR, Nasal     Status: None   Collection Time: 05/01/22  5:17 PM   Specimen: Nasal Mucosa; Nasal Swab  Result Value Ref Range Status   MRSA by PCR Next Gen NOT DETECTED NOT DETECTED Final    Comment: (NOTE) The GeneXpert MRSA Assay (FDA approved for NASAL specimens only), is one component of a comprehensive MRSA colonization surveillance program. It is not intended to diagnose MRSA infection nor to guide or  monitor treatment for MRSA infections. Test performance is not FDA approved in patients less than 34 years old. Performed at [facility], [address], [place], [place] [address]   Culture, blood (Routine X 2) w Reflex to ID Panel     Status: None   Collection Time: 05/03/22  5:30 AM   Specimen: Right Antecubital; Blood  Result Value Ref Range Status   Specimen Description   Final    RIGHT ANTECUBITAL BLOOD Performed at [facility], [address], [place], [place] [address]    Special Requests   Final    AEROBIC BOTTLE ONLY Blood Culture results may not be optimal due to an inadequate volume of blood received in culture bottles Performed at [facility], [address] [place], [place] [address]    Culture   Final    NO GROWTH 5 DAYS Performed at [facility], [address] [place], [place] [address]    Report Status 05/08/2022 FINAL  Final  Culture, blood (Routine X 2) w Reflex to ID Panel     Status: None   Collection Time: 05/03/22  5:31 AM   Specimen: Left Antecubital; Blood  Result Value Ref Range Status   Specimen Description   Final    LEFT ANTECUBITAL BLOOD Performed at [facility], [address], [place], [place] [address]    Special Requests   Final    AEROBIC BOTTLE ONLY Blood Culture adequate volume Performed at [facility], [address], [place], [place] [address]    Culture   Final    NO GROWTH 5 DAYS Performed at [facility], [address],  [place], [place] [address]    Report Status 05/08/2022 FINAL  Final     Radiology Studies: CT ABDOMEN PELVIS W CONTRAST  Result Date: 05/10/2022 CLINICAL DATA:  Lower abdominal pain, nausea and vomiting. EXAM: CT ABDOMEN AND PELVIS WITH CONTRAST TECHNIQUE: Multidetector CT imaging of the abdomen and pelvis was performed using the standard protocol following bolus administration of intravenous contrast. RADIATION DOSE REDUCTION: This exam was performed according to the departmental dose-optimization program which includes automated exposure control, adjustment of the mA and/or kV according to patient size and/or use of iterative reconstruction technique. CONTRAST:  OMNIPAQUE IOHEXOL 300 MG/ML  SOLN COMPARISON:  CT abdomen pelvis dated 05/04/2022. FINDINGS: Lower chest: No acute abnormality. Hepatobiliary: There is an 8 mm cyst in the liver. No imaging follow-up is recommended for this finding. No gallstones, gallbladder wall thickening, or biliary dilatation. Pancreas: Unremarkable. No pancreatic ductal dilatation or surrounding inflammatory changes. Spleen: Normal in size without focal abnormality. Adrenals/Urinary Tract: Adrenal glands are unremarkable. Kidneys are normal, without renal calculi, focal lesion, or hydronephrosis. Bladder is unremarkable. Stomach/Bowel: Stomach is within normal limits. Enteric contrast reaches the distal small bowel. The appendix is not definitely seen. No evidence of bowel obstruction. Vascular/Lymphatic: A few periaortic lymph nodes are at the upper limits of normal, measuring 10 mm in short axis (series 2, image 41 and image 49). A mesenteric lymph node is at the upper limits of normal, measuring 10 mm in short axis (series 2, image 53). No significant vascular findings are present. Reproductive: Multiple fluid collections with rim enhancement and associated inflammatory changes in the bilateral adnexa appear similar to prior exam. These inflammatory changes are in close  proximity with a loop of small bowel and the sigmoid colon. A fluid collection in the left adnexa without  associated rim enhancement has increased in size (4.7 x 2.8 x 3.4 cm on series 2, image 61 and series 4, image 38). A fluid collection near the sigmoid colon without rim enhancement is new since prior exam (2.4 x 3.3 x 2.4 cm on series 2, image 63 and series 4, image 31). Other: No abdominal wall hernia or abnormality. Musculoskeletal: No acute or significant osseous findings. IMPRESSION: 1. Multiple rim enhancing fluid collections with associated inflammatory changes in the bilateral adnexa appear similar to prior exam and two fluid collections without significant rim enhancement are new since 05/04/2022. These findings are favored to reflect pelvic inflammatory disease as opposed to enteritis as the primary etiology. 2. Inflammatory changes involving a loop of small bowel and the sigmoid colon as well as multiple abdominal lymph nodes at the upper limits of normal are favored to be reactive. Electronically Signed   By: Zerita Boers M.D.   On: 05/10/2022 16:06     Marzetta Board, MD, PhD [facility]  Between 7 am - 7 pm I am available, please contact me via Amion (for emergencies) or Securechat (non urgent messages)  Between 7 pm - 7 am I am not available, please contact night coverage MD/APP via Amion

## 2022-05-11 NOTE — Progress Notes (Signed)
Physical Therapy Treatment Patient Details Name: [name] MRN: [id number] DOB: Oct 28, 1985 Today's Date: 05/11/2022   History of Present Illness 36 year old female who presents with 1 week of progressively worsening right lower quadrant abdominal pain, fever, vomiting.  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  CTA abdomen performed 9/11 shows development of new fluid and inflammatory changes in the lower abdomen and pelvis. Pt also found to have acute multiple punctate CVAs:  05/06/22 MRI of the brain showed multiple areas of small infarcts involving the right pons, left putamen, right thalamus, corpus callosum and left frontal white matter.  PMHx: anxiety, polysubstance abuse, liver cirrhosis, hypertension, history of other nonhemorrhagic CVA, ventral hernia, constipation    PT Comments    Pt's cognition improved as well as mobility. Pt able to ambulate with IV pole and appears more coordinated with gait.  No LOB or unsteadiness observed.  Pt did require cues for line and IV pole.  Pt reports right sided strength also improved.  Updated d/c recommendations due to pt's progress.    Recommendations for follow up therapy are one component of a multi-disciplinary discharge planning process, led by the attending physician.  Recommendations may be updated based on patient status, additional functional criteria and insurance authorization.  Follow Up Recommendations  Outpatient PT     Assistance Recommended at Discharge Set up Supervision/Assistance  Patient can return home with the following A little help with walking and/or transfers;A little help with bathing/dressing/bathroom;Help with stairs or ramp for entrance   Equipment Recommendations  None recommended by PT    Recommendations for Other Services       Precautions / Restrictions Precautions Precautions: Fall     Mobility  Bed Mobility                General bed mobility comments: pt sitting EOB at end of bed on arrival and departure    Transfers Overall transfer level: Needs assistance Equipment used: None Transfers: Sit to/from Stand Sit to Stand: Min guard, Supervision                Ambulation/Gait Ambulation/Gait assistance: Min guard Gait Distance (Feet): 300 Feet Assistive device: IV Pole Gait Pattern/deviations: Step-through pattern, Decreased stride length       General Gait Details: pt with improved control of movement and not running into obstacles today, pushed IV pole however did not appear to need for support, no unsteadiness or LOB observed   Stairs             Wheelchair Mobility    Modified Rankin (Stroke Patients Only)       Balance Overall balance assessment: Needs assistance         Standing balance support: No upper extremity supported Standing balance-Leahy Scale: Good                              Cognition Arousal/Alertness: Awake/alert Behavior During Therapy: WFL for tasks assessed/performed Overall Cognitive Status: Within Functional Limits for tasks assessed                                          Exercises      General Comments        Pertinent Vitals/Pain Pain Assessment Pain Assessment: 0-10 Pain Score: 4  Pain Location: abdomen Pain Descriptors / Indicators: Aching, Discomfort Pain Intervention(s): Monitored during session, Repositioned    Home Living                          Prior Function            PT Goals (current goals can now be found in the care plan section) Progress towards PT goals: Progressing toward goals    Frequency    Min 3X/week      PT Plan Discharge plan needs to be updated;Frequency needs to be updated    Co-evaluation              AM-PAC PT "6 Clicks" Mobility   Outcome Measure  Help needed turning from your back to your side while in a flat bed without using  bedrails?: None Help needed moving from lying on your back to sitting on the side of a flat bed without using bedrails?: None Help needed moving to and from a bed to a chair (including a wheelchair)?: A Little Help needed standing up from a chair using your arms (e.g., wheelchair or bedside chair)?: A Little Help needed to walk in hospital room?: A Little Help needed climbing 3-5 steps with a railing? : A Little 6 Click Score: 20    End of Session   Activity Tolerance: Patient tolerated treatment well Patient left: in bed;with call bell/phone within reach   PT Visit Diagnosis: Other abnormalities of gait and mobility (R26.89)     Time: 6195-0932 PT Time Calculation (min) (ACUTE ONLY): 17 min  Charges:  $Gait Training: 8-22 mins                    Paulino Door, DPT Physical Therapist Acute Rehabilitation Services Preferred contact method: Secure Chat Weekend Pager Only: [phone] Office: [phone]  [name] 05/11/2022, 12:11 PM

## 2022-05-11 NOTE — Progress Notes (Signed)
OBSTETRICS AND GYNECOLOGY ATTENDING CONSULT NOTE  Consult Date: 05/11/2022  Reason for Consult: Pelvic abscess Consulting Provider: Dr. Janyth Pupa    Assessment/Plan: Pelvic abscess,  -based on clinical history, exam and imaging, as previously stated suspect abscess likely GI in nature.  Pt did not have any pelvic or cervical motion tenderness on bimanual exam -clinical improvement noted and pt afebrile x 24hr -empirically being treated for PID with Cipro/Flagyl and Doxy for a 10day course -appreciate recommendation per primary teams, which it sounds like plan for possible IR drainage -pap was completed as per pt it has been more than 56yrs and there is concern about outpatient follow up -no new or further recommendations.  Will sign off at this time, please call below number for any further concerns -information left for outpatient follow up at our [facility] office  Please call [phone] [facility] OB/GYN Consult Attending Monday-Friday 8am - 5pm) or [phone] [facility] OB/GYN Attending On Call all day, every day) for any gynecologic concerns at any time.  Thank you for involving [place] in the care of this patient.  Total consultation time including face-to-face time with patient (>50% of time), reviewing chart and documentation: 35 minutes  Janyth Pupa, DO Attending [place], [place] for [place] [place] [facility]   History of Present Illness: [name] is an 36 y.o. perimenopausal female being admitted due to worsening abdominal pain, nausea and constipation with h/o polysubstance abuse, liver cirrhosis, HTN, anxiety disorder. Today she notes that her pain is still present, but improved from when she first arrived.  Notes the medication is helping, but when it wears off she starts to feel the discomfort.  Pain is mostly on the lower right side.  Still some nausea, no vomiting.  She is noting some light bright red blood that started  after she had the pelvic US on 9/8. Wearing one pad per day.  She reports that her periods are mostly regular each month, but on occasion will have some intermenstrual bleeding.   -Per pt she has not been seen by gyn for many years  Pertinent OB/GYN History: Patient's last menstrual period was 05/01/2022 (approximate). OB History  No obstetric history on file.   GYN: -h/o BTL via salpingectomy -sexually active with same partner for 10+ years   Patient Active Problem List   Diagnosis Date Noted   Alcohol dependence ([place]) 05/02/2022   Hypokalemia 05/02/2022   Hepatic steatosis 05/01/2022   Abdominal pain 05/01/2022   Nausea and vomiting 05/01/2022   Cocaine abuse ([place]) 05/01/2022   Acute CVA (cerebrovascular accident) ([place]) 03/21/2020   Hypertensive urgency 03/21/2020   Bronchitis 03/21/2020   Asymptomatic bacteriuria 03/21/2020   Acute respiratory failure with hypoxia ([place]) 08/01/2017   Substance abuse ([place]) 08/01/2017   Constipation 08/01/2017   Anemia 08/01/2017   Rectal bleeding 08/01/2017   Alcohol withdrawal ([place]) 07/31/2017   Ventral hernia 10/04/2016   Incarcerated ventral hernia 10/03/2016   Anxiety 02/15/2014   Hypertension 02/15/2014    Past Medical History:  Diagnosis Date   Anxiety    Cirrhosis of liver ([place])    Cocaine use 08/04/2018   History of cocaine use 08/04/2018   Hypertension    Polysubstance abuse ([place])    Renal disorder    Kidney Infection     Past Surgical History:  Procedure Laterality Date   ARTERY REPAIR     CESAREAN SECTION     x 5   MOUTH SURGERY  TEAR DUCT PROBING     TUBAL LIGATION     VENTRAL HERNIA REPAIR N/A 10/03/2016   Procedure: OPEN INCARCERATED HERNIA REPAIR VENTRAL ADULT;  Surgeon: Ralene Ok, MD;  Location: [place];  Service: General;  Laterality: N/A;    Family History  Problem Relation Age of Onset   CAD Other    Diabetes Other    Hypertension Other    Cancer Other    Thyroid disease Other     Social  History:  reports that she has been smoking cigarettes. She has been smoking an average of .5 packs per day. She has never used smokeless tobacco. She reports current alcohol use. She reports that she does not currently use drugs after having used the following drugs: Cocaine.  Allergies:  Allergies  Allergen Reactions   Ibuprofen Anaphylaxis, Swelling and Other (See Comments)    Patient thinks it may have caused her throat to "swell"  (** PATIENT HAS BEEN TOLERATING THIS IN 2023**)   Latex Shortness Of Breath   Peanut-Containing Drug Products Anaphylaxis   Penicillins Anaphylaxis and Hives    Has patient had a PCN reaction causing immediate rash, facial/tongue/throat swelling, SOB or lightheadedness with hypotension: Yes Has patient had a PCN reaction causing severe rash involving mucus membranes or skin necrosis: unknown Has patient had a PCN reaction that required hospitalization No Has patient had a PCN reaction occurring within the last 10 years: No If all of the above answers are "NO", then may proceed with Cephalosporin use. Tolerated ceftriaxone 2018 & 2019    Acetaminophen Other (See Comments)    Causes patient to have an upset stomach- Tolerating this in 2023 (325 mg tablets)   Codeine Hives and Itching   Lisinopril Swelling   Ativan [Lorazepam] Other (See Comments)    "Hallucinations," per patient    Medications: I have reviewed the patient's current medications.  Review of Systems: Pertinent items noted in HPI and remainder of comprehensive ROS otherwise negative.  Focused Physical Examination: BP (!) 167/99   Pulse 78   Temp 98.2 F (36.8 C) (Oral)   Resp 20   Ht 5' (1.524 m)   Wt 72 kg   LMP 05/01/2022 (Approximate) Comment: negative hcg blood test 05-01-2022  SpO2 94%   BMI 31.00 kg/m  CONSTITUTIONAL: mild distress SKIN: Skin is warm and dry. No rash noted. Not diaphoretic. No erythema. No pallor. [place]: Alert and oriented to person, place, and time.  Normal reflexes, muscle tone coordination.  PSYCHIATRIC: Normal mood and affect. Normal behavior.  CARDIOVASCULAR: Normal heart rate noted, regular rhythm RESPIRATORY: Clear to auscultation bilaterally. Effort and breath sounds normal, no problems with respiration noted. ABDOMEN: Soft, normal bowel sounds, no distention noted.  +right lower quadrant tenderness with deep palpation, no guarding or rebound PELVIC: Normal appearing external genitalia; normal appearing vaginal mucosa and cervix.  Small amount of bright red blood noted- ~ 10cc.  No cervical lesions, no active bleeding appreciated.  Pap collected.  On bimanual exam- Normal uterine size, no other palpable masses, no uterine or adnexal tenderness. Done in the presence of a chaperone. MUSCULOSKELETAL: no calf tenderness, no edema bilaterally  Labs and Imaging: Results for orders placed or performed during the hospital encounter of 05/01/22 (from the past 72 hour(s))  Basic metabolic panel     Status: None   Collection Time: 05/10/22  7:00 AM  Result Value Ref Range   Sodium 137 135 - 145 mmol/L   Potassium 4.0 3.5 - 5.1 mmol/L  Chloride 104 98 - 111 mmol/L   CO2 27 22 - 32 mmol/L   Glucose, Bld 99 70 - 99 mg/dL    Comment: Glucose reference range applies only to samples taken after fasting for at least 8 hours.   BUN 10 6 - 20 mg/dL   Creatinine, Ser 0.64 0.44 - 1.00 mg/dL   Calcium 8.9 8.9 - 10.3 mg/dL   GFR, Estimated >60 >60 mL/min    Comment: (NOTE) Calculated using the CKD-EPI Creatinine Equation (2021)    Anion gap 6 5 - 15    Comment: Performed at [facility], [place] [address], [place], [place] [address]  CBC     Status: Abnormal   Collection Time: 05/10/22  7:00 AM  Result Value Ref Range   WBC 5.3 4.0 - 10.5 K/uL   RBC 3.67 (L) 3.87 - 5.11 MIL/uL   Hemoglobin 11.0 (L) 12.0 - 15.0 g/dL   HCT 35.0 (L) 36.0 - 46.0 %   MCV 95.4 80.0 - 100.0 fL   MCH 30.0 26.0 - 34.0 pg   MCHC 31.4 30.0 - 36.0 g/dL    RDW 15.3 11.5 - 15.5 %   Platelets 644 (H) 150 - 400 K/uL   nRBC 0.0 0.0 - 0.2 %    Comment: Performed at [facility], [place] [address], [place],  [address]    CT ABDOMEN PELVIS W CONTRAST  Result Date: 05/10/2022 CLINICAL DATA:  Lower abdominal pain, nausea and vomiting. EXAM: CT ABDOMEN AND PELVIS WITH CONTRAST TECHNIQUE: Multidetector CT imaging of the abdomen and pelvis was performed using the standard protocol following bolus administration of intravenous contrast. RADIATION DOSE REDUCTION: This exam was performed according to the departmental dose-optimization program which includes automated exposure control, adjustment of the mA and/or kV according to patient size and/or use of iterative reconstruction technique. CONTRAST:  174mL OMNIPAQUE IOHEXOL 300 MG/ML  SOLN COMPARISON:  CT abdomen pelvis dated 05/04/2022. FINDINGS: Lower chest: No acute abnormality. Hepatobiliary: There is an 8 mm cyst in the liver. No imaging follow-up is recommended for this finding. No gallstones, gallbladder wall thickening, or biliary dilatation. Pancreas: Unremarkable. No pancreatic ductal dilatation or surrounding inflammatory changes. Spleen: Normal in size without focal abnormality. Adrenals/Urinary Tract: Adrenal glands are unremarkable. Kidneys are normal, without renal calculi, focal lesion, or hydronephrosis. Bladder is unremarkable. Stomach/Bowel: Stomach is within normal limits. Enteric contrast reaches the distal small bowel. The appendix is not definitely seen. No evidence of bowel obstruction. Vascular/Lymphatic: A few periaortic lymph nodes are at the upper limits of normal, measuring 10 mm in short axis (series 2, image 41 and image 49). A mesenteric lymph node is at the upper limits of normal, measuring 10 mm in short axis (series 2, image 53). No significant vascular findings are present. Reproductive: Multiple fluid collections with rim enhancement and associated inflammatory  changes in the bilateral adnexa appear similar to prior exam. These inflammatory changes are in close proximity with a loop of small bowel and the sigmoid colon. A fluid collection in the left adnexa without associated rim enhancement has increased in size (4.7 x 2.8 x 3.4 cm on series 2, image 61 and series 4, image 38). A fluid collection near the sigmoid colon without rim enhancement is new since prior exam (2.4 x 3.3 x 2.4 cm on series 2, image 63 and series 4, image 31). Other: No abdominal wall hernia or abnormality. Musculoskeletal: No acute or significant osseous findings. IMPRESSION: 1. Multiple rim enhancing fluid collections  with associated inflammatory changes in the bilateral adnexa appear similar to prior exam and two fluid collections without significant rim enhancement are new since 05/04/2022. These findings are favored to reflect pelvic inflammatory disease as opposed to enteritis as the primary etiology. 2. Inflammatory changes involving a loop of small bowel and the sigmoid colon as well as multiple abdominal lymph nodes at the upper limits of normal are favored to be reactive. Electronically Signed   By: Zerita Boers M.D.   On: 05/10/2022 16:06

## 2022-05-11 NOTE — TOC Progression Note (Signed)
Transition of Care [facility]) - Progression Note    Patient Details  Name: [name] MRN: [id number] Date of Birth: 1986-02-28  Transition of Care [facility]) CM/SW Contact  Ross Ludwig, [place] Phone Number: 05/11/2022, 5:10 PM  Clinical Narrative:     TOC continuing to follow, patient may need [place] letter at discharge for medications.   Expected Discharge Plan: Home/Self Care Barriers to Discharge: Continued Medical Work up  Expected Discharge Plan and Services Expected Discharge Plan: Home/Self Care   Discharge Planning Services: [place] arrangements for the past 2 months: Mobile Home                                       Social Determinants of Health (SDOH) Interventions    Readmission Risk Interventions    05/08/2022   11:43 AM  Readmission Risk Prevention Plan  Post Dischage Appt Complete  Transportation Screening Complete

## 2022-05-11 NOTE — Progress Notes (Signed)
[facility] Gastroenterology Progress Note  [name] 36 y.o. 04/28/1986   Subjective: Patient seen and examined laying in bed, patient requesting pain medication.  Notes continued vaginal bleeding.  No new complaints   ROS : Review of Systems  Gastrointestinal:  Positive for abdominal pain. Negative for blood in stool, constipation, diarrhea, heartburn, melena, nausea and vomiting.  Genitourinary:  Negative for dysuria and urgency.    Objective: Vital signs in last 24 hours: Vitals:   05/11/22 0522 05/11/22 0909  BP: 131/76 (!) 167/99  Pulse: 78   Resp: 20   Temp: 98.2 F (36.8 C)   SpO2: 94%     Physical Exam:  General:  Alert, cooperative, no distress, appears stated age  Head:  Normocephalic, without obvious abnormality, atraumatic  Eyes:  Anicteric sclera, EOM's intact  Lungs:   Clear to auscultation bilaterally, respirations unlabored  Heart:  Regular rate and rhythm, S1, S2 normal  Abdomen:   Soft, mildly tender to the bilateral lower abdomen, bowel sounds active all four quadrants,  no masses,   Extremities: Extremities normal, atraumatic, no  edema  Pulses: 2+ and symmetric    Lab Results: Recent Labs    05/10/22 0700  NA 137  K 4.0  CL 104  CO2 27  GLUCOSE 99  BUN 10  CREATININE 0.64  CALCIUM 8.9   No results for input(s): "AST", "ALT", "ALKPHOS", "BILITOT", "PROT", "ALBUMIN" in the last 72 hours. Recent Labs    05/10/22 0700  WBC 5.3  HGB 11.0*  HCT 35.0*  MCV 95.4  PLT 644*   No results for input(s): "LABPROT", "INR" in the last 72 hours.    Assessment Abdominal pain Reported family history of Crohn's disease Pelvic abscess   With intermittent lower abdominal pain for several years now presenting to the hospital with severe constant bilateral lower abdominal pain.  She has been intermittently febrile during admission temp of 103.1 on 05/03/2022, he has been afebrile for the last 24 hours.  She is currently on metronidazole 500 mg twice  daily and Cipro 400 mg twice daily for intra-abdominal infection.  OBGYN consult for possible PID, and added doxycycline 100 mg bid for PID coverage. WBC is not elevated today.  CT angiogram with findings concerning for pelvic abscess.  Evaluation by interventional radiology with no recommended areas to drain at this time. Patient with significant reported family history of Crohn's disease.     CT angio abdomen pelvis with and without contrast 05/04/2022 1. New fluid and inflammatory changes in the lower abdomen and pelvis. These inflammatory changes are near the uterus, adnexa and loops of bowel. Inflammation source is unclear. Differential diagnosis includes enteritis, colitis and possibly pelvic inflammatory disease. Dilatation of some small bowel loops suggests that small bowel could be the inflammatory source. 2. New small complex fluid collections in the pelvis. Early or developing small abscess collections cannot be excluded.   CT abdomen pelvis with contrast 05/10/2022 1. Multiple rim enhancing fluid collections with associated inflammatory changes in the bilateral adnexa appear similar to prior exam and two fluid collections without significant rim enhancement are new since 05/04/2022. These findings are favored to reflect pelvic inflammatory disease as opposed to enteritis as the primary etiology. 2. Inflammatory changes involving a loop of small bowel and the sigmoid colon as well as multiple abdominal lymph nodes at the upper limits of normal are favored to be reactive.   Hepatitis panel ordered by ID given patient's history of cirrhosis.  Plan: Intra-abdominal infection favored  to be PID, appreciate ID and OB/GYN input.  No plan for colonoscopy at this time. [facility] will follow   Emmit Alexanders PA-C 05/11/2022, 10:32 AM  Contact #  [phone]

## 2022-05-11 NOTE — Progress Notes (Addendum)
Subjective: CC: Patient on soft diet. Notes she is only taking in a small amount of food because she gets nauseated. No vomiting. Continued pain in RLQ that is unchanged. Last BM 9/15.   Objective: Vital signs in last 24 hours: Temp:  [98.2 F (36.8 C)-98.4 F (36.9 C)] 98.4 F (36.9 C) (09/18 1330) Pulse Rate:  [78-92] 78 (09/18 1330) Resp:  [20] 20 (09/18 0522) BP: (131-167)/(76-110) 145/94 (09/18 1330) SpO2:  [94 %-98 %] 95 % (09/18 1330) Last BM Date : 05/08/22  Intake/Output from previous day: 09/17 0701 - 09/18 0700 In: 720 [P.O.:720] Out: 1600 [Urine:1600] Intake/Output this shift: No intake/output data recorded.  PE: Gen:  Alert, NAD, pleasant Abd: Soft, ND, RLQ ttp without rigidity or guarding, +BS  Lab Results:  Recent Labs    05/10/22 0700  WBC 5.3  HGB 11.0*  HCT 35.0*  PLT 644*   BMET Recent Labs    05/10/22 0700  NA 137  K 4.0  CL 104  CO2 27  GLUCOSE 99  BUN 10  CREATININE 0.64  CALCIUM 8.9   PT/INR No results for input(s): "LABPROT", "INR" in the last 72 hours. CMP     Component Value Date/Time   NA 137 05/10/2022 0700   K 4.0 05/10/2022 0700   CL 104 05/10/2022 0700   CO2 27 05/10/2022 0700   GLUCOSE 99 05/10/2022 0700   BUN 10 05/10/2022 0700   CREATININE 0.64 05/10/2022 0700   CREATININE 0.69 09/23/2016 1442   CALCIUM 8.9 05/10/2022 0700   PROT 6.2 (L) 05/08/2022 0452   ALBUMIN 2.5 (L) 05/08/2022 0452   AST 12 (L) 05/08/2022 0452   ALT 12 05/08/2022 0452   ALKPHOS 76 05/08/2022 0452   BILITOT 0.4 05/08/2022 0452   GFRNONAA >60 05/10/2022 0700   GFRNONAA >89 09/23/2016 1442   GFRAA >60 05/09/2020 0524   GFRAA >89 09/23/2016 1442   Lipase     Component Value Date/Time   LIPASE 39 05/01/2022 1025    Studies/Results: CT ABDOMEN PELVIS W CONTRAST  Result Date: 05/10/2022 CLINICAL DATA:  Lower abdominal pain, nausea and vomiting. EXAM: CT ABDOMEN AND PELVIS WITH CONTRAST TECHNIQUE: Multidetector CT imaging of  the abdomen and pelvis was performed using the standard protocol following bolus administration of intravenous contrast. RADIATION DOSE REDUCTION: This exam was performed according to the departmental dose-optimization program which includes automated exposure control, adjustment of the mA and/or kV according to patient size and/or use of iterative reconstruction technique. CONTRAST:  160mL OMNIPAQUE IOHEXOL 300 MG/ML  SOLN COMPARISON:  CT abdomen pelvis dated 05/04/2022. FINDINGS: Lower chest: No acute abnormality. Hepatobiliary: There is an 8 mm cyst in the liver. No imaging follow-up is recommended for this finding. No gallstones, gallbladder wall thickening, or biliary dilatation. Pancreas: Unremarkable. No pancreatic ductal dilatation or surrounding inflammatory changes. Spleen: Normal in size without focal abnormality. Adrenals/Urinary Tract: Adrenal glands are unremarkable. Kidneys are normal, without renal calculi, focal lesion, or hydronephrosis. Bladder is unremarkable. Stomach/Bowel: Stomach is within normal limits. Enteric contrast reaches the distal small bowel. The appendix is not definitely seen. No evidence of bowel obstruction. Vascular/Lymphatic: A few periaortic lymph nodes are at the upper limits of normal, measuring 10 mm in short axis (series 2, image 41 and image 49). A mesenteric lymph node is at the upper limits of normal, measuring 10 mm in short axis (series 2, image 53). No significant vascular findings are present. Reproductive: Multiple fluid collections with rim enhancement and  associated inflammatory changes in the bilateral adnexa appear similar to prior exam. These inflammatory changes are in close proximity with a loop of small bowel and the sigmoid colon. A fluid collection in the left adnexa without associated rim enhancement has increased in size (4.7 x 2.8 x 3.4 cm on series 2, image 61 and series 4, image 38). A fluid collection near the sigmoid colon without rim enhancement  is new since prior exam (2.4 x 3.3 x 2.4 cm on series 2, image 63 and series 4, image 31). Other: No abdominal wall hernia or abnormality. Musculoskeletal: No acute or significant osseous findings. IMPRESSION: 1. Multiple rim enhancing fluid collections with associated inflammatory changes in the bilateral adnexa appear similar to prior exam and two fluid collections without significant rim enhancement are new since 05/04/2022. These findings are favored to reflect pelvic inflammatory disease as opposed to enteritis as the primary etiology. 2. Inflammatory changes involving a loop of small bowel and the sigmoid colon as well as multiple abdominal lymph nodes at the upper limits of normal are favored to be reactive. Electronically Signed   By: Zerita Boers M.D.   On: 05/10/2022 16:06    Anti-infectives: Anti-infectives (From admission, onward)    Start     Dose/Rate Route Frequency Ordered Stop   05/11/22 1800  cefTRIAXone (ROCEPHIN) 2 g in sodium chloride 0.9 % 100 mL IVPB        2 g 200 mL/hr over 30 Minutes Intravenous Every 24 hours 05/11/22 1150     05/05/22 1315  doxycycline (VIBRA-TABS) tablet 100 mg        100 mg Oral Every 12 hours 05/05/22 1223     05/04/22 2200  metroNIDAZOLE (FLAGYL) IVPB 500 mg        500 mg 100 mL/hr over 60 Minutes Intravenous Every 12 hours 05/04/22 1434     05/04/22 1530  metroNIDAZOLE (FLAGYL) IVPB 500 mg        500 mg 100 mL/hr over 60 Minutes Intravenous  Once 05/04/22 1434 05/04/22 1645   05/04/22 1530  ciprofloxacin (CIPRO) IVPB 400 mg  Status:  Discontinued        400 mg 200 mL/hr over 60 Minutes Intravenous 2 times daily 05/04/22 1440 05/11/22 1150   05/04/22 1515  metroNIDAZOLE (FLAGYL) IVPB 500 mg  Status:  Discontinued        500 mg 100 mL/hr over 60 Minutes Intravenous Every 12 hours 05/04/22 1428 05/04/22 1434        Assessment/Plan RLQ abdominal pain Fever Pelvic abscesses  36 year old female who presents with 1 week of progressively  worsening right lower quadrant abdominal pain, fever, vomiting.  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  CTA abdomen performed 9/11 shows development of new fluid and inflammatory changes in the lower abdomen and pelvis - No indication for emergency surgery - Constellation of symptoms, her significant family history of Crohn's and CT findings are most suspicious for significant Crohn's disease. GI following. Another possible etiology is PID for which GYN has seen for, recommended Cipro/Flagyl + Doxy x 10d course. GYN ok w/ steroids if GI felt they were indicated. G/C were negative but GYN noted this was obtained post abx. Repeat CT 9/17 w/ multiple rim enhancing fluid collections with associated inflammatory changes in the bilateral adnexa; two fluid collections without significant rim enhancement (one near left adnexa and one near sigmoid colon) that are new since 09/11's CT - Radiology  felt these are favored to reflect PID as primary. Given findings, GI no longer planning colonoscopy. Would recommend touching base with GYN again for recs. Cont abx for now.  - [facility] will follow   FEN - Diet as tolerated VTE - SCD's, ok for DVT ppx from surgical perspective. On ASA for new CVA ID - Cipro/Flagyl/Doxy (severe PCN allergy). Afebrile. No tachycardia or hypotension. WBC wnl.     - Per TRH -  New CVA - Per Neuro. On ASA. Recommends Plavix when no procedures planned and DAPT for 90 days.  GLF HTN urgency Alcohol dependence Polysubstance abuse    LOS: 9 days    Jillyn Ledger , [facility] Surgery 05/11/2022, 1:33 PM Please see [place] for pager number during day hours 7:00am-4:30pm

## 2022-05-11 NOTE — Consult Note (Signed)
[facility]                                                                                        Patient Identification: Patient Name: [name] MRN: [id number] Admit Date: 05/01/2022  9:58 AM Today's Date: 05/11/2022 Reason for consult: pelvic abscesses  Requesting provider: Pamella Pert   Principal Problem:   Hypertensive urgency Active Problems:   Alcohol withdrawal (HCC)   Substance abuse (HCC)   Hepatic steatosis   Abdominal pain   Nausea and vomiting   Cocaine abuse (HCC)   Alcohol dependence (HCC)   Hypokalemia   Antibiotics:  Ciprofloxacin 9/11-c Metronidazole 9/11-c Doxycycline 9/12-c  Lines/Hardware: PIV  Assessment 36 year old female with PMH of liver cirrhosis, hypertension, anxiety, unspecified renal disorder , nonhemorrhagic CVA , ventral hernia who initially presented with lower abdominal pain for 3 days associated with dry heaves with nausea. GI/CCS and GYN following due to concerns for IBD/PID/pelvic abscesses. Non surgical candidate per [facility].  MRI Brain 9/13 with Numerous small areas of acute infarct which was thought to be water shed infarcts in the setting of HTN/hTN.   UA 9/8 with bacteriuria and pyuria with LE Blood cx 9/10 NG ( final ) 9/9 HIV NR Urine GC 9/11 negative    ID consulted for repeat CT 9/17  Multiple rim enhancing fluid collections with associated inflammatory changes in the bilateral adnexa appear similar to prior exam and two fluid collections without significant rim enhancement are new since 05/04/2022. These findings are favored to reflect pelvic inflammatory disease as opposed to enteritis as the primary etiology. Inflammatory changes involving a loop of small bowel and the sigmoid colon as well as multiple abdominal lymph nodes at the upper limits of normal are favored to be reactive.  Recommendations  Continue PO doxycycline,  ciprofloxacin and metronidazole  Will plan for PO amoxicillin challenge today. Discussed with patient and she is agreeable. If tolerates O,  will change ciprofloxacin to IV ceftriaxone IR has been consulted for possible aspiration as well as drainage of pelvic fluid collections and abscess.  Will check for Hepatitis B and C serology given liver cirrhosis Following   Rest of the management as per the primary team. Please call with questions or concerns.  Thank you for the consult  Odette Fraction, MD Infectious Disease Physician [facility] [address] [place], [place] [address] Phone: [phone]  Fax: [phone]  __________________________________________________________________________________________________________ HPI and Hospital Course: 36 year old female with PMH of liver cirrhosis, hypertension, anxiety, unspecified renal disorder , nonhemorrhagic CVA , ventral hernia, Polysubstance abuse, S/P tubal ligation, salpingectomy who presented to the ED on 9/8 with complaints of lower abdominal pain, no bowel movement for 3 days associated with dry heaves and nausea.  Last use of cocaine sometime in July. Denies having fevers but had chills.  Denies vomiting and diarrhea. Denies chest pain, cough and SOB. Denies vaginal discharge, foul smell. Last LMP was approx a week ago. Last sexual activity was approx 10 days ago. She lives with 2 friends. She has not smoked since she has been in the hospital,  reported drinking 40 ounces of beer daily. She feels overall improving since being in the hospital.   Had penicillin allergy with throat closing at the age of 1 when she had a bee sting.  She has taken cephalexin in the past for UTI. She is willing to have oral amoxicillin challenge as per discussion with myself and Id Pharm D. Had + GC in 2011 and no other STIs she is aware of.   At ED, febrile, tachycardic and hypertensive Received IV  fluids, antiemetics, analgesics and nicardipine drip for hypertension Labs remarkable for K3.2, WBC 11.1  Imagings as below  ROS: all systems reviewed with pertinent positives and negatives as listed above   Past Medical History:  Diagnosis Date   Anxiety    Cirrhosis of liver (HCC)    Cocaine use 08/04/2018   History of cocaine use 08/04/2018   Hypertension    Polysubstance abuse (HCC)    Renal disorder    Kidney Infection    Past Surgical History:  Procedure Laterality Date   ARTERY REPAIR     CESAREAN SECTION     x 5   MOUTH SURGERY     TEAR DUCT PROBING     TUBAL LIGATION     VENTRAL HERNIA REPAIR N/A 10/03/2016   Procedure: OPEN INCARCERATED HERNIA REPAIR VENTRAL ADULT;  Surgeon: Axel Filler, MD;  Location: [facility] OR;  Service: General;  Laterality: N/A;     Scheduled Meds:  amLODipine  5 mg Oral Daily   aspirin EC  81 mg Oral Daily   atorvastatin  40 mg Oral Daily   docusate sodium  100 mg Oral BID   doxycycline  100 mg Oral Q12H   folic acid  1 mg Oral Daily   metoCLOPramide (REGLAN) injection  10 mg Intravenous Q6H   multivitamin with minerals  1 tablet Oral Daily   nicotine  21 mg Transdermal Daily   pantoprazole  40 mg Oral BID AC   thiamine  100 mg Oral Daily   Continuous Infusions:  ciprofloxacin 400 mg (05/10/22 2056)   metronidazole 500 mg (05/10/22 2101)   PRN Meds:.acetaminophen, HYDROmorphone (DILAUDID) injection, hydrOXYzine, labetalol, ondansetron **OR** ondansetron (ZOFRAN) IV, mouth rinse, polyethylene glycol, traMADol  Allergies  Allergen Reactions   Ibuprofen Anaphylaxis, Swelling and Other (See Comments)    Patient thinks it may have caused her throat to "swell"  (** PATIENT HAS BEEN TOLERATING THIS IN 2023**)   Latex Shortness Of Breath   Peanut-Containing Drug Products Anaphylaxis   Penicillins Anaphylaxis and Hives    Has patient had a PCN reaction causing immediate rash, facial/tongue/throat swelling, SOB or lightheadedness with  hypotension: Yes Has patient had a PCN reaction causing severe rash involving mucus membranes or skin necrosis: unknown Has patient had a PCN reaction that required hospitalization No Has patient had a PCN reaction occurring within the last 10 years: No If all of the above answers are "NO", then may proceed with Cephalosporin use. Tolerated ceftriaxone 2018 & 2019    Acetaminophen Other (See Comments)    Causes patient to have an upset stomach- Tolerating this in 2023 (325 mg tablets)   Codeine Hives and Itching   Lisinopril Swelling   Ativan [Lorazepam] Other (See Comments)    "Hallucinations," per patient   Social History   Socioeconomic History   Marital status: Single    Spouse name: Not on file   Number of children: Not on file   Years of education: Not on file   Highest  education level: Not on file  Occupational History   Not on file  Tobacco Use   Smoking status: Every Day    Packs/day: 0.50    Types: Cigarettes   Smokeless tobacco: Never  Vaping Use   Vaping Use: Never used  Substance and Sexual Activity   Alcohol use: Yes    Comment: daily   Drug use: Not Currently    Types: Cocaine   Sexual activity: Not on file  Other Topics Concern   Not on file  Social History Narrative   Not on file   Social Determinants of Health   Financial Resource Strain: Not on file  Food Insecurity: Not on file  Transportation Needs: Not on file  Physical Activity: Not on file  Stress: Not on file  Social Connections: Not on file  Intimate Partner Violence: Not on file   Family History  Problem Relation Age of Onset   CAD Other    Diabetes Other    Hypertension Other    Cancer Other    Thyroid disease Other     Vitals BP 131/76 (BP Location: Left Arm)   Pulse 78   Temp 98.2 F (36.8 C) (Oral)   Resp 20   Ht 5' (1.524 m)   Wt 72 kg   LMP 05/01/2022 (Approximate) Comment: negative hcg blood test 05-01-2022  SpO2 94%   BMI 31.00 kg/m    Physical  Exam Constitutional: sitting up in the bed and appears comfortable    Comments:   Cardiovascular:     Rate and Rhythm: Normal rate and regular rhythm.     Heart sounds:   Pulmonary:     Effort: Pulmonary effort is normal on room air    Comments:   Abdominal:     Palpations: Abdomen is soft. Non distended     Tenderness: mild tenderness at the RLQ, otherwise non tender. No RT and guarding  Musculoskeletal:        General: No swelling or tenderness in peripheral joints   Skin:    Comments: tattoos+  Neurological:     General: Grossly non focal, awake, alert and oriented   Psychiatric:        Mood and Affect: Mood normal.    Pertinent Microbiology Results for orders placed or performed during the hospital encounter of 05/01/22  MRSA Next Gen by PCR, Nasal     Status: None   Collection Time: 05/01/22  5:17 PM   Specimen: Nasal Mucosa; Nasal Swab  Result Value Ref Range Status   MRSA by PCR Next Gen NOT DETECTED NOT DETECTED Final    Comment: (NOTE) The GeneXpert MRSA Assay (FDA approved for NASAL specimens only), is one component of a comprehensive MRSA colonization surveillance program. It is not intended to diagnose MRSA infection nor to guide or monitor treatment for MRSA infections. Test performance is not FDA approved in patients less than 48 years old. Performed at [facility], [place] [address], [place], [place] [address]   Culture, blood (Routine X 2) w Reflex to ID Panel     Status: None   Collection Time: 05/03/22  5:30 AM   Specimen: Right Antecubital; Blood  Result Value Ref Range Status   Specimen Description   Final    RIGHT ANTECUBITAL BLOOD Performed at [facility], [place] [address] [place], [place] [address]    Special Requests   Final    AEROBIC BOTTLE ONLY Blood Culture results may not be optimal due to  an inadequate volume of blood received in culture bottles Performed at [facility], [address] [place], [place] [address]    Culture   Final    NO GROWTH 5 DAYS Performed at [facility], [address] [place], [place] [address]    Report Status 05/08/2022 FINAL  Final  Culture, blood (Routine X 2) w Reflex to ID Panel     Status: None   Collection Time: 05/03/22  5:31 AM   Specimen: Left Antecubital; Blood  Result Value Ref Range Status   Specimen Description   Final    LEFT ANTECUBITAL BLOOD Performed at [facility], [address] [place], [place] [address]    Special Requests   Final    AEROBIC BOTTLE ONLY Blood Culture adequate volume Performed at [facility], [address] [place], [place] [address]    Culture   Final    NO GROWTH 5 DAYS Performed at [facility], [address], [place], [place] [address]    Report Status 05/08/2022 FINAL  Final   Pertinent Lab seen by me:    Latest Ref Rng & Units 05/10/2022    7:00 AM 05/08/2022    4:52 AM 05/07/2022    5:28 AM  CBC  WBC 4.0 - 10.5 K/uL 5.3  5.6  5.5   Hemoglobin 12.0 - 15.0 g/dL 13.0  86.5  78.4   Hematocrit 36.0 - 46.0 % 35.0  31.8  36.5   Platelets 150 - 400 K/uL 644  327  283       Latest Ref Rng & Units 05/10/2022    7:00 AM 05/08/2022    4:52 AM 05/07/2022    5:28 AM  CMP  Glucose 70 - 99 mg/dL 99  696  91   BUN 6 - 20 mg/dL 10  10  12    Creatinine 0.44 - 1.00 mg/dL 2.95  2.84  1.32   Sodium 135 - 145 mmol/L 137  138  135   Potassium 3.5 - 5.1 mmol/L 4.0  3.4  3.6   Chloride 98 - 111 mmol/L 104  106  105   CO2 22 - 32 mmol/L 27  25  22    Calcium 8.9 - 10.3 mg/dL 8.9  8.7  8.7   Total Protein 6.5 - 8.1 g/dL  6.2  6.2   Total Bilirubin 0.3 - 1.2 mg/dL  0.4  0.7   Alkaline Phos 38 - 126 U/L  76  71   AST 15 - 41 U/L  12  10   ALT 0 - 44 U/L  12  11      Pertinent Imagings/Other Imagings Plain films and CT images have been personally visualized and interpreted; radiology reports have been reviewed. Decision making incorporated into the Impression /  Recommendations.  CT ABDOMEN PELVIS W CONTRAST  Result Date: 05/10/2022 CLINICAL DATA:  Lower abdominal pain, nausea and vomiting. EXAM: CT ABDOMEN AND PELVIS WITH CONTRAST TECHNIQUE: Multidetector CT imaging of the abdomen and pelvis was performed using the standard protocol following bolus administration of intravenous contrast. RADIATION DOSE REDUCTION: This exam was performed according to the departmental dose-optimization program which includes automated exposure control, adjustment of the mA and/or kV according to patient size and/or use of iterative reconstruction technique. CONTRAST:  OMNIPAQUE IOHEXOL 300 MG/ML  SOLN COMPARISON:  CT abdomen pelvis dated 05/04/2022. FINDINGS: Lower chest: No acute abnormality. Hepatobiliary: There is an 8 mm cyst in the  liver. No imaging follow-up is recommended for this finding. No gallstones, gallbladder wall thickening, or biliary dilatation. Pancreas: Unremarkable. No pancreatic ductal dilatation or surrounding inflammatory changes. Spleen: Normal in size without focal abnormality. Adrenals/Urinary Tract: Adrenal glands are unremarkable. Kidneys are normal, without renal calculi, focal lesion, or hydronephrosis. Bladder is unremarkable. Stomach/Bowel: Stomach is within normal limits. Enteric contrast reaches the distal small bowel. The appendix is not definitely seen. No evidence of bowel obstruction. Vascular/Lymphatic: A few periaortic lymph nodes are at the upper limits of normal, measuring 10 mm in short axis (series 2, image 41 and image 49). A mesenteric lymph node is at the upper limits of normal, measuring 10 mm in short axis (series 2, image 53). No significant vascular findings are present. Reproductive: Multiple fluid collections with rim enhancement and associated inflammatory changes in the bilateral adnexa appear similar to prior exam. These inflammatory changes are in close proximity with a loop of small bowel and the sigmoid colon. A fluid  collection in the left adnexa without associated rim enhancement has increased in size (4.7 x 2.8 x 3.4 cm on series 2, image 61 and series 4, image 38). A fluid collection near the sigmoid colon without rim enhancement is new since prior exam (2.4 x 3.3 x 2.4 cm on series 2, image 63 and series 4, image 31). Other: No abdominal wall hernia or abnormality. Musculoskeletal: No acute or significant osseous findings. IMPRESSION: 1. Multiple rim enhancing fluid collections with associated inflammatory changes in the bilateral adnexa appear similar to prior exam and two fluid collections without significant rim enhancement are new since 05/04/2022. These findings are favored to reflect pelvic inflammatory disease as opposed to enteritis as the primary etiology. 2. Inflammatory changes involving a loop of small bowel and the sigmoid colon as well as multiple abdominal lymph nodes at the upper limits of normal are favored to be reactive. Electronically Signed   By: Romona Curls M.D.   On: 05/10/2022 16:06   ECHOCARDIOGRAM LIMITED BUBBLE STUDY  Result Date: 05/07/2022    ECHOCARDIOGRAM LIMITED REPORT   Patient Name:   [name] Date of Exam: 05/07/2022 Medical Rec #:  [id number]          Height:       60.0 in Accession #:    [id number]         Weight:       158.7 lb Date of Birth:  11-17-1985           BSA:          1.692 m Patient Age:    36 years           BP:           135/84 mmHg Patient Gender: F                  HR:           76 bpm. Exam Location:  Inpatient Procedure: Limited Echo and Saline Contrast Bubble Study Indications:    stroke  History:        Patient has prior history of Echocardiogram examinations, most                 recent 05/06/2022. Stroke.  Sonographer:    Cathie Hoops Referring Phys: [id number] Malachi Carl STACK IMPRESSIONS  1. Limited bubble study for stroke  2. Left ventricular ejection fraction, by estimation, is 60 to 65%. The left ventricle has normal function.  3. Agitated saline contrast  bubble study was negative, with no evidence of any interatrial shunt. Comparison(s): 05/06/2022: LVEF 60-65%. FINDINGS  Left Ventricle: Left ventricular ejection fraction, by estimation, is 60 to 65%. The left ventricle has normal function. IAS/Shunts: No atrial level shunt detected by color flow Doppler. Agitated saline contrast was given intravenously to evaluate for intracardiac shunting. Agitated saline contrast bubble study was negative, with no evidence of any interatrial shunt. [name] MD Electronically signed by [name] MD Signature Date/Time: 05/07/2022/2:51:35 PM    Final    DG Pelvis 1-2 Views  Result Date: 05/07/2022 CLINICAL DATA:  Trauma, fall EXAM: PELVIS - 1-2 VIEW COMPARISON:  None Available. FINDINGS: No fracture or dislocation is seen.  SI joints are symmetrical. IMPRESSION: No fracture or dislocation is seen. Electronically Signed   By: Ernie Avena M.D.   On: 05/07/2022 09:36   CT ANGIO HEAD W OR WO CONTRAST  Result Date: 05/06/2022 CLINICAL DATA:  Stroke follow-up. EXAM: CT ANGIOGRAPHY HEAD AND NECK TECHNIQUE: Multidetector CT imaging of the head and neck was performed using the standard protocol during bolus administration of intravenous contrast. Multiplanar CT image reconstructions and MIPs were obtained to evaluate the vascular anatomy. Carotid stenosis measurements (when applicable) are obtained utilizing NASCET criteria, using the distal internal carotid diameter as the denominator. RADIATION DOSE REDUCTION: This exam was performed according to the departmental dose-optimization program which includes automated exposure control, adjustment of the mA and/or kV according to patient size and/or use of iterative reconstruction technique. CONTRAST:  75mL OMNIPAQUE IOHEXOL 350 MG/ML SOLN COMPARISON:  Head and neck CTA 03/21/2020 FINDINGS: CTA NECK FINDINGS Aortic arch: Standard 3 vessel aortic arch. Widely patent arch vessel origins. Right carotid system: Patent and  smooth without evidence of stenosis or dissection. Tortuous proximal ICA. Left carotid system: Patent and smooth without evidence of stenosis or dissection. Tortuous mid cervical ICA. Vertebral arteries: Patent, smooth, and codominant without evidence of stenosis or dissection. Skeleton: Widespread dental caries and periapical lucencies. Other neck: No evidence of cervical lymphadenopathy or mass. Upper chest: Clear lung apices. Review of the MIP images confirms the above findings CTA HEAD FINDINGS Anterior circulation: The internal carotid arteries are widely patent from skull base to carotid termini. ACAs and MCAs are patent without evidence of a proximal branch occlusion or significant proximal stenosis. There is a new severe distal left A2 stenosis. No aneurysm is identified. Posterior circulation: The intracranial vertebral arteries are widely patent to the basilar. Patent PICA, AICA, and SCA origins are seen bilaterally. The basilar artery is widely patent. There is a large left posterior communicating artery with absent left P1 segment. Both PCAs are patent without evidence of a significant proximal stenosis. No aneurysm is identified. Venous sinuses: Patent. Anatomic variants: Fetal left PCA. Review of the MIP images confirms the above findings IMPRESSION: 1. No large vessel occlusion or significant proximal stenosis in the head or neck. 2. New severe distal left A2 stenosis. Electronically Signed   By: Sebastian Ache M.D.   On: 05/06/2022 18:17   CT ANGIO NECK W OR WO CONTRAST  Result Date: 05/06/2022 CLINICAL DATA:  Stroke follow-up. EXAM: CT ANGIOGRAPHY HEAD AND NECK TECHNIQUE: Multidetector CT imaging of the head and neck was performed using the standard protocol during bolus administration of intravenous contrast. Multiplanar CT image reconstructions and MIPs were obtained to evaluate the vascular anatomy. Carotid stenosis measurements (when applicable) are obtained utilizing NASCET criteria, using  the distal internal carotid diameter as the denominator. RADIATION DOSE REDUCTION: This exam was  performed according to the departmental dose-optimization program which includes automated exposure control, adjustment of the mA and/or kV according to patient size and/or use of iterative reconstruction technique. CONTRAST:  75mL OMNIPAQUE IOHEXOL 350 MG/ML SOLN COMPARISON:  Head and neck CTA 03/21/2020 FINDINGS: CTA NECK FINDINGS Aortic arch: Standard 3 vessel aortic arch. Widely patent arch vessel origins. Right carotid system: Patent and smooth without evidence of stenosis or dissection. Tortuous proximal ICA. Left carotid system: Patent and smooth without evidence of stenosis or dissection. Tortuous mid cervical ICA. Vertebral arteries: Patent, smooth, and codominant without evidence of stenosis or dissection. Skeleton: Widespread dental caries and periapical lucencies. Other neck: No evidence of cervical lymphadenopathy or mass. Upper chest: Clear lung apices. Review of the MIP images confirms the above findings CTA HEAD FINDINGS Anterior circulation: The internal carotid arteries are widely patent from skull base to carotid termini. ACAs and MCAs are patent without evidence of a proximal branch occlusion or significant proximal stenosis. There is a new severe distal left A2 stenosis. No aneurysm is identified. Posterior circulation: The intracranial vertebral arteries are widely patent to the basilar. Patent PICA, AICA, and SCA origins are seen bilaterally. The basilar artery is widely patent. There is a large left posterior communicating artery with absent left P1 segment. Both PCAs are patent without evidence of a significant proximal stenosis. No aneurysm is identified. Venous sinuses: Patent. Anatomic variants: Fetal left PCA. Review of the MIP images confirms the above findings IMPRESSION: 1. No large vessel occlusion or significant proximal stenosis in the head or neck. 2. New severe distal left A2  stenosis. Electronically Signed   By: Sebastian Ache M.D.   On: 05/06/2022 18:17   ECHOCARDIOGRAM COMPLETE  Result Date: 05/06/2022    ECHOCARDIOGRAM REPORT   Patient Name:   [name] Date of Exam: 05/06/2022 Medical Rec #:  [id number]          Height:       60.0 in Accession #:    [id number]         Weight:       158.7 lb Date of Birth:  1986/02/27           BSA:          1.692 m Patient Age:    36 years           BP:           117/77 mmHg Patient Gender: F                  HR:           80 bpm. Exam Location:  Inpatient Procedure: 2D Echo, Cardiac Doppler and Color Doppler Indications:    Stroke  History:        Patient has prior history of Echocardiogram examinations, most                 recent 03/21/2020. Risk Factors:Hypertension.  Sonographer:    Eduard Roux Referring Phys: [id number] Daylene Katayama GHERGHE IMPRESSIONS  1. Left ventricular ejection fraction, by estimation, is 60 to 65%. The left ventricle has normal function. The left ventricle has no regional wall motion abnormalities. There is mild concentric left ventricular hypertrophy. Left ventricular diastolic parameters are consistent with Grade II diastolic dysfunction (pseudonormalization).  2. Right ventricular systolic function is normal. The right ventricular size is normal. There is normal pulmonary artery systolic pressure. The estimated right ventricular systolic pressure is 16.2 mmHg.  3. Left atrial size was  mildly dilated.  4. The mitral valve is normal in structure. No evidence of mitral valve regurgitation. No evidence of mitral stenosis.  5. The aortic valve is tricuspid. Aortic valve regurgitation is not visualized. No aortic stenosis is present.  6. The inferior vena cava is normal in size with greater than 50% respiratory variability, suggesting right atrial pressure of 3 mmHg. FINDINGS  Left Ventricle: Left ventricular ejection fraction, by estimation, is 60 to 65%. The left ventricle has normal function. The left ventricle has no  regional wall motion abnormalities. The left ventricular internal cavity size was normal in size. There is  mild concentric left ventricular hypertrophy. Left ventricular diastolic parameters are consistent with Grade II diastolic dysfunction (pseudonormalization). Right Ventricle: The right ventricular size is normal. No increase in right ventricular wall thickness. Right ventricular systolic function is normal. There is normal pulmonary artery systolic pressure. The tricuspid regurgitant velocity is 1.82 m/s, and  with an assumed right atrial pressure of 3 mmHg, the estimated right ventricular systolic pressure is 16.2 mmHg. Left Atrium: Left atrial size was mildly dilated. Right Atrium: Right atrial size was normal in size. Pericardium: Trivial pericardial effusion is present. Mitral Valve: The mitral valve is normal in structure. No evidence of mitral valve regurgitation. No evidence of mitral valve stenosis. Tricuspid Valve: The tricuspid valve is normal in structure. Tricuspid valve regurgitation is trivial. Aortic Valve: The aortic valve is tricuspid. Aortic valve regurgitation is not visualized. No aortic stenosis is present. Aortic valve peak gradient measures 10.4 mmHg. Pulmonic Valve: The pulmonic valve was normal in structure. Pulmonic valve regurgitation is trivial. Aorta: The aortic root is normal in size and structure. Venous: The inferior vena cava is normal in size with greater than 50% respiratory variability, suggesting right atrial pressure of 3 mmHg. IAS/Shunts: No atrial level shunt detected by color flow Doppler.  LEFT VENTRICLE PLAX 2D LVIDd:         4.30 cm   Diastology LVIDs:         2.90 cm   LV e' medial:    5.63 cm/s LV PW:         1.50 cm   LV E/e' medial:  17.8 LV IVS:        1.50 cm   LV e' lateral:   7.51 cm/s LVOT diam:     1.90 cm   LV E/e' lateral: 13.3 LV SV:         75 LV SV Index:   45 LVOT Area:     2.84 cm  RIGHT VENTRICLE             IVC RV Basal diam:  2.90 cm     IVC  diam: 1.50 cm RV S prime:     13.00 cm/s TAPSE (M-mode): 2.5 cm LEFT ATRIUM             Index        RIGHT ATRIUM           Index LA diam:        3.10 cm 1.83 cm/m   RA Area:     15.40 cm LA Vol (A2C):   53.7 ml 31.74 ml/m  RA Volume:   38.90 ml  22.99 ml/m LA Vol (A4C):   62.7 ml 37.06 ml/m LA Biplane Vol: 59.3 ml 35.05 ml/m  AORTIC VALVE                 PULMONIC VALVE AV Area (Vmax): 2.40 cm  PV Vmax:       0.75 m/s AV Vmax:        161.00 cm/s  PV Peak grad:  2.2 mmHg AV Peak Grad:   10.4 mmHg LVOT Vmax:      136.00 cm/s LVOT Vmean:     99.100 cm/s LVOT VTI:       0.266 m  AORTA Ao Root diam: 3.40 cm Ao Asc diam:  3.30 cm MITRAL VALVE                TRICUSPID VALVE MV Area (PHT): 3.06 cm     TR Peak grad:   13.2 mmHg MV Decel Time: 248 msec     TR Vmax:        182.00 cm/s MV E velocity: 100.00 cm/s MV A velocity: 87.50 cm/s   SHUNTS MV E/A ratio:  1.14         Systemic VTI:  0.27 m                             Systemic Diam: 1.90 cm Dalton McleanMD Electronically signed by Wilfred Lacy Signature Date/Time: 05/06/2022/5:45:29 PM    Final    MR BRAIN WO CONTRAST  Result Date: 05/06/2022 CLINICAL DATA:  Acute neuro deficit. EXAM: MRI HEAD WITHOUT CONTRAST TECHNIQUE: Multiplanar, multiecho pulse sequences of the brain and surrounding structures were obtained without intravenous contrast. COMPARISON:  CT head 05/06/2022.  MRI head 03/20/2020 FINDINGS: Brain: Multiple areas of restricted diffusion compatible with acute infarct. These are all small areas of infarct involving the right pons, left putamen, right thalamus, corpus callosum, and the left frontal white matter. Chronic infarcts in the pons especially on the right. Chronic lacunar infarcts in the thalamus bilaterally. Chronic infarcts in the basal ganglia bilaterally. Multiple foci of chronic hemorrhage involving the pons. Chronic microhemorrhage in the right thalamus and right putamen. No hydrocephalus or mass lesion. Vascular: Normal arterial  flow voids Skull and upper cervical spine: Negative Sinuses/Orbits: Mild mucosal edema paranasal sinuses. Negative orbit Other: None IMPRESSION: Numerous small areas of acute infarct as described above. Correlate with recent history of hypertension or hypotension. Cerebral emboli possible however no cortical infarct is seen as would be expected with emboli. Advanced for age chronic microvascular ischemia and multiple areas of chronic microhemorrhage. Correlate with risk factors for small vessel disease. Electronically Signed   By: Marlan Palau M.D.   On: 05/06/2022 14:51   CT HEAD WO CONTRAST ( )  Result Date: 05/06/2022 CLINICAL DATA:  Patient is unable to move the right foot this morning. 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 RADIATION DOSE REDUCTION: This exam was performed according to the departmental dose-optimization program which includes automated exposure control, adjustment of the mA and/or kV according to patient size and/or use of iterative reconstruction technique. COMPARISON:  CT brain 03/20/2020, MRI head 03/20/2020 FINDINGS: Brain: Chronic left thalamic and left lentiform nucleus lacunar infarcts. There is also a chronic infarct in the right thalamus. Compared to prior exams there is an age indeterminate hypodensity in the left caudate head (series 2, image 15). No evidence of hydrocephalus. No hemorrhage. No extra-axial fluid collections. Vascular: No hyperdense vessel or unexpected calcification. Skull: Normal. Negative for fracture or focal lesion. Sinuses/Orbits: No acute finding. Other: None. IMPRESSION: Compared to prior exam there is a new  hypodensity in the left caudate head. Although this has a chronic appearance, it was not visualized on prior imaging and is technically age indeterminate. If there is clinical concern for an  acute infarct, this could be further assessed with a brain MRI. Electronically Signed   By: Lorenza Cambridge M.D.   On: 05/06/2022 12:01   CT Angio Abd/Pel w/ and/or w/o  Result Date: 05/04/2022 CLINICAL DATA:  Mid and lower abdominal pain. Evaluate for acute mesenteric ischemia. EXAM: CTA ABDOMEN AND PELVIS WITHOUT AND WITH CONTRAST TECHNIQUE: Multidetector CT imaging of the abdomen and pelvis was performed using the standard protocol during bolus administration of intravenous contrast. Multiplanar reconstructed images and MIPs were obtained and reviewed to evaluate the vascular anatomy. RADIATION DOSE REDUCTION: This exam was performed according to the departmental dose-optimization program which includes automated exposure control, adjustment of the mA and/or kV according to patient size and/or use of iterative reconstruction technique. CONTRAST:  OMNIPAQUE IOHEXOL 350 MG/ML SOLN COMPARISON:  CT abdomen and pelvis  05/01/2022 and 08/04/2018 FINDINGS: VASCULAR Aorta: Normal caliber aorta without aneurysm, dissection, vasculitis or significant stenosis. Celiac: Patent without evidence of aneurysm, dissection, vasculitis or significant stenosis. Variant anatomy with only 2 main branches coming off the celiac trunk. The 2 main branches are the left gastric artery and the splenic artery. Common hepatic artery is coming off the left gastric artery in the expected location of an accessory left gastric artery. There is a small gastroduodenal artery. SMA: Patent without evidence of aneurysm, dissection, vasculitis or significant stenosis. Renals: Both renal arteries are patent without evidence of aneurysm, dissection, vasculitis, fibromuscular dysplasia or significant stenosis. IMA: Patent without evidence of aneurysm, dissection, vasculitis or significant stenosis. Inflow: Patent without evidence of aneurysm, dissection, vasculitis or significant stenosis. Proximal Outflow: Bilateral common femoral and visualized  portions of the superficial and profunda femoral arteries are patent without evidence of aneurysm, dissection, vasculitis or significant stenosis. Veins: Portal veins and main mesenteric veins are patent without filling defects. IVC and renal veins are patent. No gross abnormality to the iliac veins. Review of the MIP images confirms the above findings. NON-VASCULAR Lower chest: Mild dependent changes in the posterior right lower lobe. Otherwise, the lung bases are clear. Hepatobiliary: 8 mm hypodensity in the anterior liver is probably an incidental finding. Gallbladder is decompressed. No biliary dilatation. Pancreas: Unremarkable. No pancreatic ductal dilatation or surrounding inflammatory changes. Spleen: Normal in size without focal abnormality. Adrenals/Urinary Tract: Normal adrenal glands. Normal appearance of both kidneys hydronephrosis. 6 mm hypodensity in the right kidney lower pole on sequence 4, image 114 is too small to definitively characterize but likely represents a cyst. This structure does not require dedicated follow-up. No suspicious renal lesions. Mild wall thickening in the urinary bladder. Stomach/Bowel: Normal appearance of the stomach. Question a small hiatal hernia. New fluid and inflammatory changes in the lower abdomen and in the pelvis. Inflammatory changes are centered around uterus and adnexal structures and near loops of small bowel in the lower abdomen. Mildly dilated loops of small bowel in the lower abdomen, largest measuring up to 3.3 cm. Inflammatory changes are also the near the sigmoid colon. Appendix is not confidently identified. Lymphatic: Mildly prominent retroperitoneal lymph nodes. Index lymph node between the aorta and IVC on sequence 11 image 100 measures 1.2 cm in the short axis and this lymph node measured approximately 0.7 cm on 08/04/2018. No significant lymph node enlargement in the pelvis. Reproductive: Fluid and stranding throughout the pelvis around the uterus  and adnexal structures. No discrete adnexal lesion. Other: There is extensive stranding with a small amount of fluid throughout the lower abdomen and pelvis. Evidence for developing fluid collections in the pelvis. A collection along the left side of the pelvis on sequence 11 image 194 measures 4.8 x 2.7 cm. Evidence for small complex fluid collections in the pre rectal region on image 194. There is additional complex fluid between the uterus and right adnexal structures on image 172. Musculoskeletal: No acute bone abnormality. IMPRESSION: VASCULAR 1. Arterial structures are widely patent without significant atherosclerotic disease. No evidence for arterial stenosis. 2. Variant celiac artery anatomy as described. NON-VASCULAR 1. 1. New fluid and inflammatory changes in the lower abdomen and pelvis. These inflammatory changes are near the uterus, adnexa and loops of bowel. Inflammation source is unclear. Differential diagnosis includes enteritis, colitis and possibly pelvic inflammatory disease. Dilatation of some small bowel loops suggests that small bowel could be the inflammatory source. 2. New small complex fluid collections in the pelvis. Early or developing small abscess collections cannot be excluded. Recommend short-term follow-up CT to evaluate these fluid collections. 3. Slightly enlarged retroperitoneal lymph nodes as described. Findings are nonspecific but could be reactive. Recommend attention to these on follow-up imaging. 4. Mild wall thickening in the urinary bladder is nonspecific due to incomplete distension and could be also be related to surrounding inflammatory changes. Electronically Signed   By: Richarda Overlie M.D.   On: 05/04/2022 14:19   US Pelvis Complete  Result Date: 05/01/2022 CLINICAL DATA:  Pelvic pain EXAM: TRANSABDOMINAL AND TRANSVAGINAL ULTRASOUND OF PELVIS DOPPLER ULTRASOUND OF OVARIES TECHNIQUE: Both transabdominal and transvaginal ultrasound examinations of the pelvis were  performed. Transabdominal technique was performed for global imaging of the pelvis including uterus, ovaries, adnexal regions, and pelvic cul-de-sac. It was necessary to proceed with endovaginal exam following the transabdominal exam to visualize the endometrium and ovaries. Color and duplex Doppler ultrasound was utilized to evaluate blood flow to the ovaries. COMPARISON:  CT abdomen 05/01/2022 FINDINGS: Uterus Measurements: 9.4 x 5 x 5.8 cm = volume: 140.8 mL. No fibroids or other mass visualized. Endometrium Thickness: 10 mm.  No focal abnormality visualized. Right ovary Measurements: 3.3 x 2.3 x 3.1 cm = volume: 12 mL. Normal appearance/no adnexal mass. Left ovary Measurements: 3 x 2.5 x 3.1 cm = volume: 12 mL. Normal appearance/no adnexal mass. Pulsed Doppler evaluation of both ovaries demonstrates normal low-resistance arterial and venous waveforms. Other findings No abnormal free fluid. IMPRESSION: 1. Normal pelvic ultrasound. 2. No sonographic findings to explain the patient's pelvic pain. Electronically Signed   By: Elige Ko M.D.   On: 05/01/2022 14:30   US Transvaginal Non-OB  Result Date: 05/01/2022 CLINICAL DATA:  Pelvic pain EXAM: TRANSABDOMINAL AND TRANSVAGINAL ULTRASOUND OF PELVIS DOPPLER ULTRASOUND OF OVARIES TECHNIQUE: Both transabdominal and transvaginal ultrasound examinations of the pelvis were performed. Transabdominal technique was performed for global imaging of the pelvis including uterus, ovaries, adnexal regions, and pelvic cul-de-sac. It was necessary to proceed with endovaginal exam following the transabdominal exam to visualize the endometrium and ovaries. Color and duplex Doppler ultrasound was utilized to evaluate blood flow to the ovaries. COMPARISON:  CT abdomen 05/01/2022 FINDINGS: Uterus Measurements: 9.4 x 5 x 5.8 cm = volume: 140.8 mL. No fibroids or other mass visualized. Endometrium Thickness: 10 mm.  No focal abnormality visualized. Right ovary Measurements: 3.3 x 2.3 x  3.1 cm = volume: 12 mL. Normal appearance/no adnexal mass. Left ovary Measurements: 3 x 2.5 x 3.1  cm = volume: 12 mL. Normal appearance/no adnexal mass. Pulsed Doppler evaluation of both ovaries demonstrates normal low-resistance arterial and venous waveforms. Other findings No abnormal free fluid. IMPRESSION: 1. Normal pelvic ultrasound. 2. No sonographic findings to explain the patient's pelvic pain. Electronically Signed   By: Elige Ko M.D.   On: 05/01/2022 14:30   [place] Art/Ven Flow Abd Pelv Doppler  Result Date: 05/01/2022 CLINICAL DATA:  Pelvic pain EXAM: TRANSABDOMINAL AND TRANSVAGINAL ULTRASOUND OF PELVIS DOPPLER ULTRASOUND OF OVARIES TECHNIQUE: Both transabdominal and transvaginal ultrasound examinations of the pelvis were performed. Transabdominal technique was performed for global imaging of the pelvis including uterus, ovaries, adnexal regions, and pelvic cul-de-sac. It was necessary to proceed with endovaginal exam following the transabdominal exam to visualize the endometrium and ovaries. Color and duplex Doppler ultrasound was utilized to evaluate blood flow to the ovaries. COMPARISON:  CT abdomen 05/01/2022 FINDINGS: Uterus Measurements: 9.4 x 5 x 5.8 cm = volume: 140.8 mL. No fibroids or other mass visualized. Endometrium Thickness: 10 mm.  No focal abnormality visualized. Right ovary Measurements: 3.3 x 2.3 x 3.1 cm = volume: 12 mL. Normal appearance/no adnexal mass. Left ovary Measurements: 3 x 2.5 x 3.1 cm = volume: 12 mL. Normal appearance/no adnexal mass. Pulsed Doppler evaluation of both ovaries demonstrates normal low-resistance arterial and venous waveforms. Other findings No abnormal free fluid. IMPRESSION: 1. Normal pelvic ultrasound. 2. No sonographic findings to explain the patient's pelvic pain. Electronically Signed   By: Elige Ko M.D.   On: 05/01/2022 14:30   CT Abdomen Pelvis Wo Contrast  Result Date: 05/01/2022 CLINICAL DATA:  Three days of lower abdominal pain. Positive  hematuria. Prior history of tubal ligation. EXAM: CT ABDOMEN AND PELVIS WITHOUT CONTRAST TECHNIQUE: Multidetector CT imaging of the abdomen and pelvis was performed following the standard protocol without IV contrast. RADIATION DOSE REDUCTION: This exam was performed according to the departmental dose-optimization program which includes automated exposure control, adjustment of the mA and/or kV according to patient size and/or use of iterative reconstruction technique. COMPARISON:  August 04, 2018 FINDINGS: Lower chest: No acute abnormality. Hepatobiliary: Diffuse hepatic steatosis. Subtle hypodensity along the anterior aspect of the liver measures 7 mm on image 14/2 and while technically too small to accurately characterize but is stable dating back to August 04, 2018 consistent with a benign finding. Cholelithiasis without findings of acute cholecystitis. No biliary ductal dilation. Pancreas: No pancreatic ductal dilation or evidence of acute inflammation. Spleen: No splenomegaly. Adrenals/Urinary Tract: Mild thickening of the bilateral adrenal glands without discrete nodularity, favor hyperplasia. No hydronephrosis. No renal, ureteral or bladder calculi. Urinary bladder is unremarkable for degree of distension. Stomach/Bowel: Hyperdense material in the colon traversing the rectum likely reflects ingested antidiarrheal agent. Stomach is unremarkable for degree of distension. No pathologic dilation of small or large bowel. The appendix appears normal. Colon is predominately decompressed limiting evaluation. Fibrofatty infiltration of the terminal ileum and colon likely reflect sequela of chronic inflammation. No evidence of acute bowel inflammation. Vascular/Lymphatic: Normal caliber abdominal aorta. Prominent retroperitoneal lymph nodes are similar dating back to August 04, 2018 consistent with a benign/indolent process. Reproductive: Uterus and bilateral adnexa are unremarkable in noncontrast CT appearance  for reproductive age female. Other: Trace pelvic free fluid is within physiologic normal limits. Musculoskeletal: No acute osseous abnormality. IMPRESSION: 1. No hydronephrosis or evidence of obstructive uropathy. 2. Hepatic steatosis. 3. Cholelithiasis without findings of acute cholecystitis. 4. Fibrofatty infiltration of the terminal ileum and colon as can be seen with chronic  inflammation. No evidence of acute bowel inflammation. Electronically Signed   By: Maudry Mayhew M.D.   On: 05/01/2022 12:59    I spent 80 minutes for this patient encounter including review of prior medical records/discussing diagnostics and treatment plan with the patient/family/coordinate care with primary/other specialits with greater than 50% of time in face to face encounter.   Electronically signed by:   Odette Fraction, MD Infectious Disease Physician [facility] Pager: [phone]

## 2022-05-11 NOTE — Progress Notes (Signed)
Pt called nurse to room and asked to go outside. Nurse offered to walk pt around the floor. When nurse informed pt it was against hospital policy to leave the floor and go outside, pt began yelling and threatening to leave.  Security was called.  Pt continued yelling that she needed to go outside.  She decided to leave AMA.  Nurse and security told pt the consequences of leaving and that she would have to come back through the emergency room and start the process all over, but pt stated she "didn't care and she would go to a different hospital". AMA paperwork signed.  Pt and mother gathered belongings.  IV taken out.  MD notified.

## 2022-05-11 NOTE — Consult Note (Signed)
Chief Complaint: Intra abdominal abscesses. Request is for intra abdominal abscess drain placement possible aspiration.   Referring Physician(s): Dr. Wardell Heath  Supervising Physician: Jacqulynn Cadet  Patient Status: [facility] - In-pt  History of Present Illness: [name] is a 36 y.o. female inpatient. Smoker. History of anxiety, cirrhosis, substance abuse, unspecified renal disorder, CVA. . Presented to the ED at [facility] on 9.8.23  with lower abdominal pain, nausea and diarrhea.  CT abd pelvis from 9.17.23 reads 1. Multiple rim enhancing fluid collections with associated inflammatory changes in the bilateral adnexa appear similar to prior exam and two fluid collections without significant rim enhancement are new since 05/04/2022. These findings are favored to reflect pelvic inflammatory disease as opposed to enteritis as the primary etiology. Team is requesting aspiration- drain placement of intra abdominal abscesses.   Patient alert and laying in bed calm. Endorses nausea and RLQ abdominal pain.  Denies any fevers, headache, chest pain, SOB, cough, vomiting or bleeding. Return precautions and treatment recommendations and follow-up discussed with the patient  who is agreeable with the plan.   Past Medical History:  Diagnosis Date   Anxiety    Cirrhosis of liver ([place])    Cocaine use 08/04/2018   History of cocaine use 08/04/2018   Hypertension    Polysubstance abuse ([place])    Renal disorder    Kidney Infection     Past Surgical History:  Procedure Laterality Date   ARTERY REPAIR     CESAREAN SECTION     x 5   MOUTH SURGERY     TEAR DUCT PROBING     TUBAL LIGATION     VENTRAL HERNIA REPAIR N/A 10/03/2016   Procedure: OPEN INCARCERATED HERNIA REPAIR VENTRAL ADULT;  Surgeon: Ralene Ok, MD;  Location: [place];  Service: General;  Laterality: N/A;    Allergies: Ibuprofen, Latex, Peanut-containing drug products, Penicillins, Acetaminophen, Codeine, Lisinopril, and Ativan  [lorazepam]  Medications: Prior to Admission medications   Medication Sig Start Date End Date Taking? Authorizing Provider  acetaminophen (TYLENOL) 325 MG tablet Take 325-650 mg by mouth every 6 (six) hours as needed for mild pain or headache.   Yes [provider]  cloNIDine (CATAPRES) 0.1 MG tablet Take 1 tablet (0.1 mg total) by mouth 2 (two) times daily. 05/01/17  Yes Upstill, Nehemiah Settle, PA-C  ibuprofen (ADVIL) 200 MG tablet Take 200-400 mg by mouth every 6 (six) hours as needed for headache or mild pain.   Yes [provider]  chlordiazePOXIDE (LIBRIUM) 25 MG capsule Take 1 capsule (25 mg total) by mouth 3 (three) times daily as needed for withdrawal. Patient not taking: Reported on 05/01/2022 08/04/18   Etta Quill, NP  omeprazole (PRILOSEC) 40 MG capsule Take 1 capsule (40 mg total) by mouth daily. Patient not taking: Reported on 05/01/2022 09/03/17   Ward, Delice Bison, DO  propranolol (INDERAL) 40 MG tablet Take 1 tablet (40 mg total) by mouth 2 (two) times daily. Patient not taking: Reported on 05/01/2022 05/01/17   Charlann Lange, PA-C     Family History  Problem Relation Age of Onset   CAD Other    Diabetes Other    Hypertension Other    Cancer Other    Thyroid disease Other     Social History   Socioeconomic History   Marital status: Single    Spouse name: Not on file   Number of children: Not on file   Years of education: Not on file   Highest education level:  Not on file  Occupational History   Not on file  Tobacco Use   Smoking status: Every Day    Packs/day: 0.50    Types: Cigarettes   Smokeless tobacco: Never  Vaping Use   Vaping Use: Never used  Substance and Sexual Activity   Alcohol use: Yes    Comment: daily   Drug use: Not Currently    Types: Cocaine   Sexual activity: Not on file  Other Topics Concern   Not on file  Social History Narrative   Not on file   Social Determinants of Health   Financial Resource Strain: Not on file  Food  Insecurity: Food Insecurity Present (05/11/2022)   Hunger Vital Sign    Worried About Running Out of Food in the Last Year: Sometimes true    Ran Out of Food in the Last Year: Sometimes true  Transportation Needs: Not on file  Physical Activity: Not on file  Stress: Not on file  Social Connections: Not on file    Review of Systems: A 12 point ROS discussed and pertinent positives are indicated in the HPI above.  All other systems are negative.  Review of Systems  Constitutional:  Negative for fatigue and fever.  HENT:  Negative for congestion.   Respiratory:  Negative for cough and shortness of breath.   Gastrointestinal:  Positive for abdominal pain (RLQ) and nausea. Negative for diarrhea and vomiting.    Vital Signs: BP (!) 167/99   Pulse 78   Temp 98.2 F (36.8 C) (Oral)   Resp 20   Ht 5' (1.524 m)   Wt 158 lb 11.7 oz (72 kg)   LMP 05/01/2022 (Approximate) Comment: negative hcg blood test 05-01-2022  SpO2 94%   BMI 31.00 kg/m     Physical Exam Vitals and nursing note reviewed.  Constitutional:      Appearance: She is well-developed.  HENT:     Head: Normocephalic and atraumatic.  Eyes:     Conjunctiva/sclera: Conjunctivae normal.  Cardiovascular:     Rate and Rhythm: Normal rate and regular rhythm.  Pulmonary:     Effort: Pulmonary effort is normal.     Breath sounds: Normal breath sounds.  Musculoskeletal:     Cervical back: Normal range of motion.  Skin:    General: Skin is warm and dry.  Neurological:     Mental Status: She is alert and oriented to person, place, and time.     Imaging: CT ABDOMEN PELVIS W CONTRAST  Result Date: 05/10/2022 CLINICAL DATA:  Lower abdominal pain, nausea and vomiting. EXAM: CT ABDOMEN AND PELVIS WITH CONTRAST TECHNIQUE: Multidetector CT imaging of the abdomen and pelvis was performed using the standard protocol following bolus administration of intravenous contrast. RADIATION DOSE REDUCTION: This exam was performed according  to the departmental dose-optimization program which includes automated exposure control, adjustment of the mA and/or kV according to patient size and/or use of iterative reconstruction technique. CONTRAST:  OMNIPAQUE IOHEXOL 300 MG/ML  SOLN COMPARISON:  CT abdomen pelvis dated 05/04/2022. FINDINGS: Lower chest: No acute abnormality. Hepatobiliary: There is an 8 mm cyst in the liver. No imaging follow-up is recommended for this finding. No gallstones, gallbladder wall thickening, or biliary dilatation. Pancreas: Unremarkable. No pancreatic ductal dilatation or surrounding inflammatory changes. Spleen: Normal in size without focal abnormality. Adrenals/Urinary Tract: Adrenal glands are unremarkable. Kidneys are normal, without renal calculi, focal lesion, or hydronephrosis. Bladder is unremarkable. Stomach/Bowel: Stomach is within normal limits. Enteric contrast reaches the distal small  bowel. The appendix is not definitely seen. No evidence of bowel obstruction. Vascular/Lymphatic: A few periaortic lymph nodes are at the upper limits of normal, measuring 10 mm in short axis (series 2, image 41 and image 49). A mesenteric lymph node is at the upper limits of normal, measuring 10 mm in short axis (series 2, image 53). No significant vascular findings are present. Reproductive: Multiple fluid collections with rim enhancement and associated inflammatory changes in the bilateral adnexa appear similar to prior exam. These inflammatory changes are in close proximity with a loop of small bowel and the sigmoid colon. A fluid collection in the left adnexa without associated rim enhancement has increased in size (4.7 x 2.8 x 3.4 cm on series 2, image 61 and series 4, image 38). A fluid collection near the sigmoid colon without rim enhancement is new since prior exam (2.4 x 3.3 x 2.4 cm on series 2, image 63 and series 4, image 31). Other: No abdominal wall hernia or abnormality. Musculoskeletal: No acute or significant  osseous findings. IMPRESSION: 1. Multiple rim enhancing fluid collections with associated inflammatory changes in the bilateral adnexa appear similar to prior exam and two fluid collections without significant rim enhancement are new since 05/04/2022. These findings are favored to reflect pelvic inflammatory disease as opposed to enteritis as the primary etiology. 2. Inflammatory changes involving a loop of small bowel and the sigmoid colon as well as multiple abdominal lymph nodes at the upper limits of normal are favored to be reactive. Electronically Signed   By: Romona Curls M.D.   On: 05/10/2022 16:06   ECHOCARDIOGRAM LIMITED BUBBLE STUDY  Result Date: 05/07/2022    ECHOCARDIOGRAM LIMITED REPORT   Patient Name:   MERRIANNE MCCUMBERS Date of Exam: 05/07/2022 Medical Rec #:  [id number]          Height:       60.0 in Accession #:    [id number]         Weight:       158.7 lb Date of Birth:  [birth year]           BSA:          1.692 m Patient Age:    36 years           BP:           135/84 mmHg Patient Gender: F                  HR:           76 bpm. Exam Location:  Inpatient Procedure: Limited Echo and Saline Contrast Bubble Study Indications:    stroke  History:        Patient has prior history of Echocardiogram examinations, most                 recent 05/06/2022. Stroke.  Sonographer:    Cathie Hoops Referring Phys: [id number] Malachi Carl STACK IMPRESSIONS  1. Limited bubble study for stroke  2. Left ventricular ejection fraction, by estimation, is 60 to 65%. The left ventricle has normal function.  3. Agitated saline contrast bubble study was negative, with no evidence of any interatrial shunt. Comparison(s): 05/06/2022: LVEF 60-65%. FINDINGS  Left Ventricle: Left ventricular ejection fraction, by estimation, is 60 to 65%. The left ventricle has normal function. IAS/Shunts: No atrial level shunt detected by color flow Doppler. Agitated saline contrast was given intravenously to evaluate for intracardiac shunting.  Agitated saline contrast bubble study was negative, with  no evidence of any interatrial shunt. [name] MD Electronically signed by [name] MD Signature Date/Time: 05/07/2022/2:51:35 PM    Final    DG Pelvis 1-2 Views  Result Date: 05/07/2022 CLINICAL DATA:  Trauma, fall EXAM: PELVIS - 1-2 VIEW COMPARISON:  None Available. FINDINGS: No fracture or dislocation is seen.  SI joints are symmetrical. IMPRESSION: No fracture or dislocation is seen. Electronically Signed   By: Ernie Avena M.D.   On: 05/07/2022 09:36   CT ANGIO HEAD W OR WO CONTRAST  Result Date: 05/06/2022 CLINICAL DATA:  Stroke follow-up. EXAM: CT ANGIOGRAPHY HEAD AND NECK TECHNIQUE: Multidetector CT imaging of the head and neck was performed using the standard protocol during bolus administration of intravenous contrast. Multiplanar CT image reconstructions and MIPs were obtained to evaluate the vascular anatomy. Carotid stenosis measurements (when applicable) are obtained utilizing NASCET criteria, using the distal internal carotid diameter as the denominator. RADIATION DOSE REDUCTION: This exam was performed according to the departmental dose-optimization program which includes automated exposure control, adjustment of the mA and/or kV according to patient size and/or use of iterative reconstruction technique. CONTRAST:  75mL OMNIPAQUE IOHEXOL 350 MG/ML SOLN COMPARISON:  Head and neck CTA 03/21/2020 FINDINGS: CTA NECK FINDINGS Aortic arch: Standard 3 vessel aortic arch. Widely patent arch vessel origins. Right carotid system: Patent and smooth without evidence of stenosis or dissection. Tortuous proximal ICA. Left carotid system: Patent and smooth without evidence of stenosis or dissection. Tortuous mid cervical ICA. Vertebral arteries: Patent, smooth, and codominant without evidence of stenosis or dissection. Skeleton: Widespread dental caries and periapical lucencies. Other neck: No evidence of cervical lymphadenopathy or  mass. Upper chest: Clear lung apices. Review of the MIP images confirms the above findings CTA HEAD FINDINGS Anterior circulation: The internal carotid arteries are widely patent from skull base to carotid termini. ACAs and MCAs are patent without evidence of a proximal branch occlusion or significant proximal stenosis. There is a new severe distal left A2 stenosis. No aneurysm is identified. Posterior circulation: The intracranial vertebral arteries are widely patent to the basilar. Patent PICA, AICA, and SCA origins are seen bilaterally. The basilar artery is widely patent. There is a large left posterior communicating artery with absent left P1 segment. Both PCAs are patent without evidence of a significant proximal stenosis. No aneurysm is identified. Venous sinuses: Patent. Anatomic variants: Fetal left PCA. Review of the MIP images confirms the above findings IMPRESSION: 1. No large vessel occlusion or significant proximal stenosis in the head or neck. 2. New severe distal left A2 stenosis. Electronically Signed   By: Sebastian Ache M.D.   On: 05/06/2022 18:17   CT ANGIO NECK W OR WO CONTRAST  Result Date: 05/06/2022 CLINICAL DATA:  Stroke follow-up. EXAM: CT ANGIOGRAPHY HEAD AND NECK TECHNIQUE: Multidetector CT imaging of the head and neck was performed using the standard protocol during bolus administration of intravenous contrast. Multiplanar CT image reconstructions and MIPs were obtained to evaluate the vascular anatomy. Carotid stenosis measurements (when applicable) are obtained utilizing NASCET criteria, using the distal internal carotid diameter as the denominator. RADIATION DOSE REDUCTION: This exam was performed according to the departmental dose-optimization program which includes automated exposure control, adjustment of the mA and/or kV according to patient size and/or use of iterative reconstruction technique. CONTRAST:  75mL OMNIPAQUE IOHEXOL 350 MG/ML SOLN COMPARISON:  Head and neck CTA  03/21/2020 FINDINGS: CTA NECK FINDINGS Aortic arch: Standard 3 vessel aortic arch. Widely patent arch vessel origins. Right carotid system: Patent and  smooth without evidence of stenosis or dissection. Tortuous proximal ICA. Left carotid system: Patent and smooth without evidence of stenosis or dissection. Tortuous mid cervical ICA. Vertebral arteries: Patent, smooth, and codominant without evidence of stenosis or dissection. Skeleton: Widespread dental caries and periapical lucencies. Other neck: No evidence of cervical lymphadenopathy or mass. Upper chest: Clear lung apices. Review of the MIP images confirms the above findings CTA HEAD FINDINGS Anterior circulation: The internal carotid arteries are widely patent from skull base to carotid termini. ACAs and MCAs are patent without evidence of a proximal branch occlusion or significant proximal stenosis. There is a new severe distal left A2 stenosis. No aneurysm is identified. Posterior circulation: The intracranial vertebral arteries are widely patent to the basilar. Patent PICA, AICA, and SCA origins are seen bilaterally. The basilar artery is widely patent. There is a large left posterior communicating artery with absent left P1 segment. Both PCAs are patent without evidence of a significant proximal stenosis. No aneurysm is identified. Venous sinuses: Patent. Anatomic variants: Fetal left PCA. Review of the MIP images confirms the above findings IMPRESSION: 1. No large vessel occlusion or significant proximal stenosis in the head or neck. 2. New severe distal left A2 stenosis. Electronically Signed   By: Sebastian Ache M.D.   On: 05/06/2022 18:17   ECHOCARDIOGRAM COMPLETE  Result Date: 05/06/2022    ECHOCARDIOGRAM REPORT   Patient Name:   VALENTINE KUECHLE Date of Exam: 05/06/2022 Medical Rec #:  [id number]          Height:       60.0 in Accession #:    [id number]         Weight:       158.7 lb Date of Birth:  [birth year]           BSA:          1.692 m Patient  Age:    36 years           BP:           117/77 mmHg Patient Gender: F                  HR:           80 bpm. Exam Location:  Inpatient Procedure: 2D Echo, Cardiac Doppler and Color Doppler Indications:    Stroke  History:        Patient has prior history of Echocardiogram examinations, most                 recent 03/21/2020. Risk Factors:Hypertension.  Sonographer:    Eduard Roux Referring Phys: [id number] Daylene Katayama GHERGHE IMPRESSIONS  1. Left ventricular ejection fraction, by estimation, is 60 to 65%. The left ventricle has normal function. The left ventricle has no regional wall motion abnormalities. There is mild concentric left ventricular hypertrophy. Left ventricular diastolic parameters are consistent with Grade II diastolic dysfunction (pseudonormalization).  2. Right ventricular systolic function is normal. The right ventricular size is normal. There is normal pulmonary artery systolic pressure. The estimated right ventricular systolic pressure is 16.2 mmHg.  3. Left atrial size was mildly dilated.  4. The mitral valve is normal in structure. No evidence of mitral valve regurgitation. No evidence of mitral stenosis.  5. The aortic valve is tricuspid. Aortic valve regurgitation is not visualized. No aortic stenosis is present.  6. The inferior vena cava is normal in size with greater than 50% respiratory variability, suggesting right atrial pressure of 3 mmHg. FINDINGS  Left Ventricle: Left ventricular ejection fraction, by estimation, is 60 to 65%. The left ventricle has normal function. The left ventricle has no regional wall motion abnormalities. The left ventricular internal cavity size was normal in size. There is  mild concentric left ventricular hypertrophy. Left ventricular diastolic parameters are consistent with Grade II diastolic dysfunction (pseudonormalization). Right Ventricle: The right ventricular size is normal. No increase in right ventricular wall thickness. Right ventricular systolic  function is normal. There is normal pulmonary artery systolic pressure. The tricuspid regurgitant velocity is 1.82 m/s, and  with an assumed right atrial pressure of 3 mmHg, the estimated right ventricular systolic pressure is 16.2 mmHg. Left Atrium: Left atrial size was mildly dilated. Right Atrium: Right atrial size was normal in size. Pericardium: Trivial pericardial effusion is present. Mitral Valve: The mitral valve is normal in structure. No evidence of mitral valve regurgitation. No evidence of mitral valve stenosis. Tricuspid Valve: The tricuspid valve is normal in structure. Tricuspid valve regurgitation is trivial. Aortic Valve: The aortic valve is tricuspid. Aortic valve regurgitation is not visualized. No aortic stenosis is present. Aortic valve peak gradient measures 10.4 mmHg. Pulmonic Valve: The pulmonic valve was normal in structure. Pulmonic valve regurgitation is trivial. Aorta: The aortic root is normal in size and structure. Venous: The inferior vena cava is normal in size with greater than 50% respiratory variability, suggesting right atrial pressure of 3 mmHg. IAS/Shunts: No atrial level shunt detected by color flow Doppler.  LEFT VENTRICLE PLAX 2D LVIDd:         4.30 cm   Diastology LVIDs:         2.90 cm   LV e' medial:    5.63 cm/s LV PW:         1.50 cm   LV E/e' medial:  17.8 LV IVS:        1.50 cm   LV e' lateral:   7.51 cm/s LVOT diam:     1.90 cm   LV E/e' lateral: 13.3 LV SV:         75 LV SV Index:   45 LVOT Area:     2.84 cm  RIGHT VENTRICLE             IVC RV Basal diam:  2.90 cm     IVC diam: 1.50 cm RV S prime:     13.00 cm/s TAPSE (M-mode): 2.5 cm LEFT ATRIUM             Index        RIGHT ATRIUM           Index LA diam:        3.10 cm 1.83 cm/m   RA Area:     15.40 cm LA Vol (A2C):   53.7 ml 31.74 ml/m  RA Volume:   38.90 ml  22.99 ml/m LA Vol (A4C):   62.7 ml 37.06 ml/m LA Biplane Vol: 59.3 ml 35.05 ml/m  AORTIC VALVE                 PULMONIC VALVE AV Area (Vmax): 2.40  cm     PV Vmax:       0.75 m/s AV Vmax:        161.00 cm/s  PV Peak grad:  2.2 mmHg AV Peak Grad:   10.4 mmHg LVOT Vmax:      136.00 cm/s LVOT Vmean:     99.100 cm/s LVOT VTI:       0.266 m  AORTA Ao Root diam: 3.40 cm Ao Asc diam:  3.30 cm MITRAL VALVE                TRICUSPID VALVE MV Area (PHT): 3.06 cm     TR Peak grad:   13.2 mmHg MV Decel Time: 248 msec     TR Vmax:        182.00 cm/s MV E velocity: 100.00 cm/s MV A velocity: 87.50 cm/s   SHUNTS MV E/A ratio:  1.14         Systemic VTI:  0.27 m                             Systemic Diam: 1.90 cm Dalton McleanMD Electronically signed by Wilfred Lacy Signature Date/Time: 05/06/2022/5:45:29 PM    Final    MR BRAIN WO CONTRAST  Result Date: 05/06/2022 CLINICAL DATA:  Acute neuro deficit. EXAM: MRI HEAD WITHOUT CONTRAST TECHNIQUE: Multiplanar, multiecho pulse sequences of the brain and surrounding structures were obtained without intravenous contrast. COMPARISON:  CT head 05/06/2022.  MRI head 03/20/2020 FINDINGS: Brain: Multiple areas of restricted diffusion compatible with acute infarct. These are all small areas of infarct involving the right pons, left putamen, right thalamus, corpus callosum, and the left frontal white matter. Chronic infarcts in the pons especially on the right. Chronic lacunar infarcts in the thalamus bilaterally. Chronic infarcts in the basal ganglia bilaterally. Multiple foci of chronic hemorrhage involving the pons. Chronic microhemorrhage in the right thalamus and right putamen. No hydrocephalus or mass lesion. Vascular: Normal arterial flow voids Skull and upper cervical spine: Negative Sinuses/Orbits: Mild mucosal edema paranasal sinuses. Negative orbit Other: None IMPRESSION: Numerous small areas of acute infarct as described above. Correlate with recent history of hypertension or hypotension. Cerebral emboli possible however no cortical infarct is seen as would be expected with emboli. Advanced for age chronic microvascular  ischemia and multiple areas of chronic microhemorrhage. Correlate with risk factors for small vessel disease. Electronically Signed   By: Marlan Palau M.D.   On: 05/06/2022 14:51   CT HEAD WO CONTRAST ( )  Result Date: 05/06/2022 CLINICAL DATA:  Patient is unable to move the right foot this morning. 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 RADIATION DOSE REDUCTION: This exam was performed according to the departmental dose-optimization program which includes automated exposure control, adjustment of the mA and/or kV according to patient size and/or use of iterative reconstruction technique. COMPARISON:  CT brain 03/20/2020, MRI head 03/20/2020 FINDINGS: Brain: Chronic left thalamic and left lentiform nucleus lacunar infarcts. There is also a chronic infarct in the right thalamus. Compared to prior exams there is an age indeterminate hypodensity in the left caudate head (series 2, image 15). No evidence of hydrocephalus. No hemorrhage. No extra-axial fluid collections. Vascular: No hyperdense vessel or unexpected calcification. Skull: Normal. Negative for fracture or focal lesion. Sinuses/Orbits: No acute finding. Other: None. IMPRESSION: Compared to prior exam there is a new hypodensity in the left caudate head. Although this has a chronic appearance, it was not visualized on prior imaging and is technically age indeterminate. If there is clinical concern for an acute infarct, this could be further assessed with a brain MRI. Electronically Signed   By: Lorenza Cambridge M.D.   On: 05/06/2022 12:01   CT Angio Abd/Pel w/ and/or  w/o  Result Date: 05/04/2022 CLINICAL DATA:  Mid and lower abdominal pain. Evaluate for acute mesenteric ischemia. EXAM: CTA ABDOMEN AND PELVIS WITHOUT AND WITH CONTRAST TECHNIQUE: Multidetector CT imaging of the abdomen and pelvis was  performed using the standard protocol during bolus administration of intravenous contrast. Multiplanar reconstructed images and MIPs were obtained and reviewed to evaluate the vascular anatomy. RADIATION DOSE REDUCTION: This exam was performed according to the departmental dose-optimization program which includes automated exposure control, adjustment of the mA and/or kV according to patient size and/or use of iterative reconstruction technique. CONTRAST:  OMNIPAQUE IOHEXOL 350 MG/ML SOLN COMPARISON:  CT abdomen and pelvis  05/01/2022 and 08/04/2018 FINDINGS: VASCULAR Aorta: Normal caliber aorta without aneurysm, dissection, vasculitis or significant stenosis. Celiac: Patent without evidence of aneurysm, dissection, vasculitis or significant stenosis. Variant anatomy with only 2 main branches coming off the celiac trunk. The 2 main branches are the left gastric artery and the splenic artery. Common hepatic artery is coming off the left gastric artery in the expected location of an accessory left gastric artery. There is a small gastroduodenal artery. SMA: Patent without evidence of aneurysm, dissection, vasculitis or significant stenosis. Renals: Both renal arteries are patent without evidence of aneurysm, dissection, vasculitis, fibromuscular dysplasia or significant stenosis. IMA: Patent without evidence of aneurysm, dissection, vasculitis or significant stenosis. Inflow: Patent without evidence of aneurysm, dissection, vasculitis or significant stenosis. Proximal Outflow: Bilateral common femoral and visualized portions of the superficial and profunda femoral arteries are patent without evidence of aneurysm, dissection, vasculitis or significant stenosis. Veins: Portal veins and main mesenteric veins are patent without filling defects. IVC and renal veins are patent. No gross abnormality to the iliac veins. Review of the MIP images confirms the above findings. NON-VASCULAR Lower chest: Mild dependent  changes in the posterior right lower lobe. Otherwise, the lung bases are clear. Hepatobiliary: 8 mm hypodensity in the anterior liver is probably an incidental finding. Gallbladder is decompressed. No biliary dilatation. Pancreas: Unremarkable. No pancreatic ductal dilatation or surrounding inflammatory changes. Spleen: Normal in size without focal abnormality. Adrenals/Urinary Tract: Normal adrenal glands. Normal appearance of both kidneys hydronephrosis. 6 mm hypodensity in the right kidney lower pole on sequence 4, image 114 is too small to definitively characterize but likely represents a cyst. This structure does not require dedicated follow-up. No suspicious renal lesions. Mild wall thickening in the urinary bladder. Stomach/Bowel: Normal appearance of the stomach. Question a small hiatal hernia. New fluid and inflammatory changes in the lower abdomen and in the pelvis. Inflammatory changes are centered around uterus and adnexal structures and near loops of small bowel in the lower abdomen. Mildly dilated loops of small bowel in the lower abdomen, largest measuring up to 3.3 cm. Inflammatory changes are also the near the sigmoid colon. Appendix is not confidently identified. Lymphatic: Mildly prominent retroperitoneal lymph nodes. Index lymph node between the aorta and IVC on sequence 11 image 100 measures 1.2 cm in the short axis and this lymph node measured approximately 0.7 cm on 08/04/2018. No significant lymph node enlargement in the pelvis. Reproductive: Fluid and stranding throughout the pelvis around the uterus and adnexal structures. No discrete adnexal lesion. Other: There is extensive stranding with a small amount of fluid throughout the lower abdomen and pelvis. Evidence for developing fluid collections in the pelvis. A collection along the left side of the pelvis on sequence 11 image 194 measures 4.8 x 2.7 cm. Evidence for small complex fluid collections in the pre rectal region on image  194.  There is additional complex fluid between the uterus and right adnexal structures on image 172. Musculoskeletal: No acute bone abnormality. IMPRESSION: VASCULAR 1. Arterial structures are widely patent without significant atherosclerotic disease. No evidence for arterial stenosis. 2. Variant celiac artery anatomy as described. NON-VASCULAR 1. 1. New fluid and inflammatory changes in the lower abdomen and pelvis. These inflammatory changes are near the uterus, adnexa and loops of bowel. Inflammation source is unclear. Differential diagnosis includes enteritis, colitis and possibly pelvic inflammatory disease. Dilatation of some small bowel loops suggests that small bowel could be the inflammatory source. 2. New small complex fluid collections in the pelvis. Early or developing small abscess collections cannot be excluded. Recommend short-term follow-up CT to evaluate these fluid collections. 3. Slightly enlarged retroperitoneal lymph nodes as described. Findings are nonspecific but could be reactive. Recommend attention to these on follow-up imaging. 4. Mild wall thickening in the urinary bladder is nonspecific due to incomplete distension and could be also be related to surrounding inflammatory changes. Electronically Signed   By: Richarda Overlie M.D.   On: 05/04/2022 14:19   US Pelvis Complete  Result Date: 05/01/2022 CLINICAL DATA:  Pelvic pain EXAM: TRANSABDOMINAL AND TRANSVAGINAL ULTRASOUND OF PELVIS DOPPLER ULTRASOUND OF OVARIES TECHNIQUE: Both transabdominal and transvaginal ultrasound examinations of the pelvis were performed. Transabdominal technique was performed for global imaging of the pelvis including uterus, ovaries, adnexal regions, and pelvic cul-de-sac. It was necessary to proceed with endovaginal exam following the transabdominal exam to visualize the endometrium and ovaries. Color and duplex Doppler ultrasound was utilized to evaluate blood flow to the ovaries. COMPARISON:  CT abdomen 05/01/2022  FINDINGS: Uterus Measurements: 9.4 x 5 x 5.8 cm = volume: 140.8 mL. No fibroids or other mass visualized. Endometrium Thickness: 10 mm.  No focal abnormality visualized. Right ovary Measurements: 3.3 x 2.3 x 3.1 cm = volume: 12 mL. Normal appearance/no adnexal mass. Left ovary Measurements: 3 x 2.5 x 3.1 cm = volume: 12 mL. Normal appearance/no adnexal mass. Pulsed Doppler evaluation of both ovaries demonstrates normal low-resistance arterial and venous waveforms. Other findings No abnormal free fluid. IMPRESSION: 1. Normal pelvic ultrasound. 2. No sonographic findings to explain the patient's pelvic pain. Electronically Signed   By: Elige Ko M.D.   On: 05/01/2022 14:30   US Transvaginal Non-OB  Result Date: 05/01/2022 CLINICAL DATA:  Pelvic pain EXAM: TRANSABDOMINAL AND TRANSVAGINAL ULTRASOUND OF PELVIS DOPPLER ULTRASOUND OF OVARIES TECHNIQUE: Both transabdominal and transvaginal ultrasound examinations of the pelvis were performed. Transabdominal technique was performed for global imaging of the pelvis including uterus, ovaries, adnexal regions, and pelvic cul-de-sac. It was necessary to proceed with endovaginal exam following the transabdominal exam to visualize the endometrium and ovaries. Color and duplex Doppler ultrasound was utilized to evaluate blood flow to the ovaries. COMPARISON:  CT abdomen 05/01/2022 FINDINGS: Uterus Measurements: 9.4 x 5 x 5.8 cm = volume: 140.8 mL. No fibroids or other mass visualized. Endometrium Thickness: 10 mm.  No focal abnormality visualized. Right ovary Measurements: 3.3 x 2.3 x 3.1 cm = volume: 12 mL. Normal appearance/no adnexal mass. Left ovary Measurements: 3 x 2.5 x 3.1 cm = volume: 12 mL. Normal appearance/no adnexal mass. Pulsed Doppler evaluation of both ovaries demonstrates normal low-resistance arterial and venous waveforms. Other findings No abnormal free fluid. IMPRESSION: 1. Normal pelvic ultrasound. 2. No sonographic findings to explain the patient's  pelvic pain. Electronically Signed   By: Elige Ko M.D.   On: 05/01/2022 14:30   [place] Art/Ven Flow Abd  Pelv Doppler  Result Date: 05/01/2022 CLINICAL DATA:  Pelvic pain EXAM: TRANSABDOMINAL AND TRANSVAGINAL ULTRASOUND OF PELVIS DOPPLER ULTRASOUND OF OVARIES TECHNIQUE: Both transabdominal and transvaginal ultrasound examinations of the pelvis were performed. Transabdominal technique was performed for global imaging of the pelvis including uterus, ovaries, adnexal regions, and pelvic cul-de-sac. It was necessary to proceed with endovaginal exam following the transabdominal exam to visualize the endometrium and ovaries. Color and duplex Doppler ultrasound was utilized to evaluate blood flow to the ovaries. COMPARISON:  CT abdomen 05/01/2022 FINDINGS: Uterus Measurements: 9.4 x 5 x 5.8 cm = volume: 140.8 mL. No fibroids or other mass visualized. Endometrium Thickness: 10 mm.  No focal abnormality visualized. Right ovary Measurements: 3.3 x 2.3 x 3.1 cm = volume: 12 mL. Normal appearance/no adnexal mass. Left ovary Measurements: 3 x 2.5 x 3.1 cm = volume: 12 mL. Normal appearance/no adnexal mass. Pulsed Doppler evaluation of both ovaries demonstrates normal low-resistance arterial and venous waveforms. Other findings No abnormal free fluid. IMPRESSION: 1. Normal pelvic ultrasound. 2. No sonographic findings to explain the patient's pelvic pain. Electronically Signed   By: Elige Ko M.D.   On: 05/01/2022 14:30   CT Abdomen Pelvis Wo Contrast  Result Date: 05/01/2022 CLINICAL DATA:  Three days of lower abdominal pain. Positive hematuria. Prior history of tubal ligation. EXAM: CT ABDOMEN AND PELVIS WITHOUT CONTRAST TECHNIQUE: Multidetector CT imaging of the abdomen and pelvis was performed following the standard protocol without IV contrast. RADIATION DOSE REDUCTION: This exam was performed according to the departmental dose-optimization program which includes automated exposure control, adjustment of the mA  and/or kV according to patient size and/or use of iterative reconstruction technique. COMPARISON:  August 04, 2018 FINDINGS: Lower chest: No acute abnormality. Hepatobiliary: Diffuse hepatic steatosis. Subtle hypodensity along the anterior aspect of the liver measures 7 mm on image 14/2 and while technically too small to accurately characterize but is stable dating back to August 04, 2018 consistent with a benign finding. Cholelithiasis without findings of acute cholecystitis. No biliary ductal dilation. Pancreas: No pancreatic ductal dilation or evidence of acute inflammation. Spleen: No splenomegaly. Adrenals/Urinary Tract: Mild thickening of the bilateral adrenal glands without discrete nodularity, favor hyperplasia. No hydronephrosis. No renal, ureteral or bladder calculi. Urinary bladder is unremarkable for degree of distension. Stomach/Bowel: Hyperdense material in the colon traversing the rectum likely reflects ingested antidiarrheal agent. Stomach is unremarkable for degree of distension. No pathologic dilation of small or large bowel. The appendix appears normal. Colon is predominately decompressed limiting evaluation. Fibrofatty infiltration of the terminal ileum and colon likely reflect sequela of chronic inflammation. No evidence of acute bowel inflammation. Vascular/Lymphatic: Normal caliber abdominal aorta. Prominent retroperitoneal lymph nodes are similar dating back to August 04, 2018 consistent with a benign/indolent process. Reproductive: Uterus and bilateral adnexa are unremarkable in noncontrast CT appearance for reproductive age female. Other: Trace pelvic free fluid is within physiologic normal limits. Musculoskeletal: No acute osseous abnormality. IMPRESSION: 1. No hydronephrosis or evidence of obstructive uropathy. 2. Hepatic steatosis. 3. Cholelithiasis without findings of acute cholecystitis. 4. Fibrofatty infiltration of the terminal ileum and colon as can be seen with chronic  inflammation. No evidence of acute bowel inflammation. Electronically Signed   By: Maudry Mayhew M.D.   On: 05/01/2022 12:59    Labs:  CBC: Recent Labs    05/06/22 0456 05/07/22 0528 05/08/22 0452 05/10/22 0700  WBC 6.9 5.5 5.6 5.3  HGB 11.3* 11.2* 10.2* 11.0*  HCT 35.9* 36.5 31.8* 35.0*  PLT 209 283 327  644*    COAGS: No results for input(s): "INR", "APTT" in the last 8760 hours.  BMP: Recent Labs    05/06/22 0456 05/07/22 0528 05/08/22 0452 05/10/22 0700  NA 136 135 138 137  K 4.1 3.6 3.4* 4.0  CL 106 105 106 104  CO2 21* 22 25 27   GLUCOSE 98 91 101* 99  BUN 15 12 10 10   CALCIUM 8.8* 8.7* 8.7* 8.9  CREATININE 0.69 0.60 0.50 0.64  GFRNONAA >60 >60 >60 >60    LIVER FUNCTION TESTS: Recent Labs    05/01/22 1025 05/02/22 0306 05/07/22 0528 05/08/22 0452  BILITOT 1.1 0.7 0.7 0.4  AST 17 17 10* 12*  ALT 19 14 11 12   ALKPHOS 97 75 71 76  PROT 9.0* 7.0 6.2* 6.2*  ALBUMIN 4.4 3.5 2.7* 2.5*      Assessment and Plan:  36 y.o. female inpatient. Smoker. History of anxiety, cirrhosis, substance abuse, unspecified renal disorder, CVA. Presented to the ED at [facility] on 9.8.23  with lower abdominal pain, nausea and diarrhea.  CT abd pelvis from 9.17.23 reads 1. Multiple rim enhancing fluid collections with associated inflammatory changes in the bilateral adnexa appear similar to prior exam and two fluid collections without significant rim enhancement are new since 05/04/2022. These findings are favored to reflect pelvic inflammatory disease as opposed to enteritis as the primary etiology. Team is requesting aspiration- drain placement of intra abdominal abscesses.   No leukocytosis. Blood cultures negative.  Last temperature of 103.1 on 9.10.23.  patient is on 81 mg of ASA. No pertinent allergies.  Case has been reviewed and procedure approved by Dr. Archer Asa.  Patient tentatively scheduled for 9.19.23.  Team instructed to:  Keep Patient to be NPO after midnight  IR  will call patient when ready.   Risks and benefits discussed with the patient including bleeding, infection, damage to adjacent structures, bowel perforation/fistula connection, and sepsis.  All of the patient's questions were answered, patient is agreeable to proceed. Consent signed and in chart.   Thank you for this interesting consult.  I greatly enjoyed meeting Tandi R Afzal and look forward to participating in their care.  A copy of this report was sent to the requesting provider on this date.  Electronically Signed: Alene Mires, NP 05/11/2022, 12:37 PM   I spent a total of 40 Minutes    in face to face in clinical consultation, greater than 50% of which was counseling/coordinating care for intra abdominal abscess drain - aspiration

## 2022-05-12 ENCOUNTER — Other Ambulatory Visit: Payer: Self-pay

## 2022-05-12 ENCOUNTER — Encounter (HOSPITAL_COMMUNITY): Payer: Self-pay

## 2022-05-12 ENCOUNTER — Inpatient Hospital Stay (HOSPITAL_COMMUNITY)
Admission: EM | Admit: 2022-05-12 | Discharge: 2022-05-13 | DRG: 757 | Discharge status: Against Medical Advice | Payer: Self-pay | Attending: Internal Medicine | Admitting: Internal Medicine

## 2022-05-12 DIAGNOSIS — K651 Peritoneal abscess: Secondary | ICD-10-CM | POA: Diagnosis present

## 2022-05-12 DIAGNOSIS — F1020 Alcohol dependence, uncomplicated: Secondary | ICD-10-CM | POA: Diagnosis present

## 2022-05-12 DIAGNOSIS — Z833 Family history of diabetes mellitus: Secondary | ICD-10-CM

## 2022-05-12 DIAGNOSIS — R112 Nausea with vomiting, unspecified: Secondary | ICD-10-CM

## 2022-05-12 DIAGNOSIS — Z8249 Family history of ischemic heart disease and other diseases of the circulatory system: Secondary | ICD-10-CM

## 2022-05-12 DIAGNOSIS — F1410 Cocaine abuse, uncomplicated: Secondary | ICD-10-CM | POA: Diagnosis present

## 2022-05-12 DIAGNOSIS — N730 Acute parametritis and pelvic cellulitis: Principal | ICD-10-CM | POA: Diagnosis present

## 2022-05-12 DIAGNOSIS — Z8673 Personal history of transient ischemic attack (TIA), and cerebral infarction without residual deficits: Secondary | ICD-10-CM

## 2022-05-12 DIAGNOSIS — F17210 Nicotine dependence, cigarettes, uncomplicated: Secondary | ICD-10-CM | POA: Diagnosis present

## 2022-05-12 DIAGNOSIS — E669 Obesity, unspecified: Secondary | ICD-10-CM | POA: Diagnosis present

## 2022-05-12 DIAGNOSIS — I10 Essential (primary) hypertension: Secondary | ICD-10-CM | POA: Diagnosis present

## 2022-05-12 DIAGNOSIS — Z720 Tobacco use: Secondary | ICD-10-CM

## 2022-05-12 DIAGNOSIS — K7460 Unspecified cirrhosis of liver: Secondary | ICD-10-CM | POA: Diagnosis present

## 2022-05-12 DIAGNOSIS — Z6831 Body mass index (BMI) 31.0-31.9, adult: Secondary | ICD-10-CM

## 2022-05-12 LAB — CBC WITH DIFFERENTIAL/PLATELET
Abs Immature Granulocytes: 0.04 10*3/uL (ref 0.00–0.07)
Basophils Absolute: 0 10*3/uL (ref 0.0–0.1)
Basophils Relative: 1 %
Eosinophils Absolute: 0.1 10*3/uL (ref 0.0–0.5)
Eosinophils Relative: 2 %
HCT: 40.8 % (ref 36.0–46.0)
Hemoglobin: 13.6 g/dL (ref 12.0–15.0)
Immature Granulocytes: 1 %
Lymphocytes Relative: 22 %
Lymphs Abs: 1.6 10*3/uL (ref 0.7–4.0)
MCH: 31 pg (ref 26.0–34.0)
MCHC: 33.3 g/dL (ref 30.0–36.0)
MCV: 92.9 fL (ref 80.0–100.0)
Monocytes Absolute: 0.5 10*3/uL (ref 0.1–1.0)
Monocytes Relative: 7 %
Neutro Abs: 5 10*3/uL (ref 1.7–7.7)
Neutrophils Relative %: 67 %
Platelets: 830 10*3/uL — ABNORMAL HIGH (ref 150–400)
RBC: 4.39 MIL/uL (ref 3.87–5.11)
RDW: 15.3 % (ref 11.5–15.5)
WBC: 7.4 10*3/uL (ref 4.0–10.5)
nRBC: 0 % (ref 0.0–0.2)

## 2022-05-12 LAB — COMPREHENSIVE METABOLIC PANEL
ALT: 25 U/L (ref 0–44)
AST: 31 U/L (ref 15–41)
Albumin: 3.7 g/dL (ref 3.5–5.0)
Alkaline Phosphatase: 119 U/L (ref 38–126)
Anion gap: 9 (ref 5–15)
BUN: 9 mg/dL (ref 6–20)
CO2: 24 mmol/L (ref 22–32)
Calcium: 9.6 mg/dL (ref 8.9–10.3)
Chloride: 107 mmol/L (ref 98–111)
Creatinine, Ser: 0.71 mg/dL (ref 0.44–1.00)
GFR, Estimated: >60 mL/min (ref >60)
Glucose, Bld: 105 mg/dL — ABNORMAL HIGH (ref 70–99)
Potassium: 4.2 mmol/L (ref 3.5–5.1)
Sodium: 140 mmol/L (ref 135–145)
Total Bilirubin: 0.3 mg/dL (ref 0.3–1.2)
Total Protein: 7.9 g/dL (ref 6.5–8.1)

## 2022-05-12 LAB — I-STAT BETA HCG BLOOD, ED (MC, WL, AP ONLY): I-stat hCG, quantitative: <5 m[IU]/mL (ref <5)

## 2022-05-12 LAB — TYPE AND SCREEN
ABO/RH(D): O NEG
Antibody Screen: NEGATIVE

## 2022-05-12 LAB — HCV AB W REFLEX TO QUANT PCR: HCV Ab: NONREACTIVE

## 2022-05-12 LAB — ETHANOL: Alcohol, Ethyl (B): <10 mg/dL (ref <10)

## 2022-05-12 LAB — PROTIME-INR
INR: 1.1 (ref 0.8–1.2)
Prothrombin Time: 14.2 seconds (ref 11.4–15.2)

## 2022-05-12 LAB — AMMONIA: Ammonia: 24 umol/L (ref 9–35)

## 2022-05-12 LAB — APTT: aPTT: 31 seconds (ref 24–36)

## 2022-05-12 LAB — LIPASE, BLOOD: Lipase: 37 U/L (ref 11–51)

## 2022-05-12 LAB — HCV INTERPRETATION

## 2022-05-12 MED ORDER — ONDANSETRON HCL 4 MG/2ML IJ SOLN: 4.0000 mg | Freq: Once | INTRAMUSCULAR | Status: DC

## 2022-05-12 MED ORDER — THIAMINE HCL 100 MG/ML IJ SOLN
100.0000 mg | Freq: Every day | INTRAMUSCULAR | Status: DC
  Filled 2022-05-12 (×2): qty 2

## 2022-05-12 MED ORDER — METRONIDAZOLE 500 MG/100ML IV SOLN
500.0000 mg | Freq: Two times a day (BID) | INTRAVENOUS | Status: DC
  Administered 2022-05-13 (×2): 500 mg via INTRAVENOUS
  Filled 2022-05-12 (×2): qty 100

## 2022-05-12 MED ORDER — PROPRANOLOL HCL 20 MG PO TABS
40.0000 mg | ORAL_TABLET | Freq: Two times a day (BID) | ORAL | Status: DC
  Administered 2022-05-12 – 2022-05-13 (×2): 40 mg via ORAL
  Filled 2022-05-12 (×2): qty 2

## 2022-05-12 MED ORDER — METRONIDAZOLE 500 MG/100ML IV SOLN
500.0000 mg | Freq: Once | INTRAVENOUS | Status: AC
  Administered 2022-05-12: 500 mg via INTRAVENOUS
  Filled 2022-05-12: qty 100

## 2022-05-12 MED ORDER — OXYCODONE HCL 5 MG PO TABS
5.0000 mg | ORAL_TABLET | ORAL | Status: DC | PRN
  Administered 2022-05-12 – 2022-05-13 (×6): 5 mg via ORAL
  Filled 2022-05-12 (×7): qty 1

## 2022-05-12 MED ORDER — PROCHLORPERAZINE EDISYLATE 10 MG/2ML IJ SOLN
10.0000 mg | Freq: Four times a day (QID) | INTRAMUSCULAR | Status: DC | PRN
  Administered 2022-05-12: 10 mg via INTRAVENOUS
  Filled 2022-05-12: qty 2

## 2022-05-12 MED ORDER — ADULT MULTIVITAMIN W/MINERALS CH
1.0000 | ORAL_TABLET | Freq: Every day | ORAL | Status: DC
  Administered 2022-05-12 – 2022-05-13 (×2): 1 via ORAL
  Filled 2022-05-12 (×2): qty 1

## 2022-05-12 MED ORDER — CLONIDINE HCL 0.1 MG PO TABS
0.1000 mg | ORAL_TABLET | Freq: Two times a day (BID) | ORAL | Status: DC
  Administered 2022-05-12 – 2022-05-13 (×2): 0.1 mg via ORAL
  Filled 2022-05-12 (×2): qty 1

## 2022-05-12 MED ORDER — FENTANYL CITRATE PF 50 MCG/ML IJ SOSY
12.5000 ug | PREFILLED_SYRINGE | Freq: Four times a day (QID) | INTRAMUSCULAR | Status: DC | PRN
  Administered 2022-05-12 – 2022-05-13 (×3): 12.5 ug via INTRAVENOUS
  Filled 2022-05-12 (×3): qty 1

## 2022-05-12 MED ORDER — SODIUM CHLORIDE 0.9 % IV BOLUS
1000.0000 mL | Freq: Once | INTRAVENOUS | Status: AC
  Administered 2022-05-12: 1000 mL via INTRAVENOUS

## 2022-05-12 MED ORDER — FENTANYL CITRATE PF 50 MCG/ML IJ SOSY: 50.0000 ug | PREFILLED_SYRINGE | Freq: Once | INTRAMUSCULAR | Status: DC

## 2022-05-12 MED ORDER — NICOTINE 14 MG/24HR TD PT24
14.0000 mg | MEDICATED_PATCH | Freq: Every day | TRANSDERMAL | Status: DC
  Administered 2022-05-12 – 2022-05-13 (×2): 14 mg via TRANSDERMAL
  Filled 2022-05-12 (×2): qty 1

## 2022-05-12 MED ORDER — CIPROFLOXACIN IN D5W 400 MG/200ML IV SOLN
400.0000 mg | INTRAVENOUS | Status: AC
  Administered 2022-05-12: 400 mg via INTRAVENOUS
  Filled 2022-05-12: qty 200

## 2022-05-12 MED ORDER — THIAMINE MONONITRATE 100 MG PO TABS
100.0000 mg | ORAL_TABLET | Freq: Every day | ORAL | Status: DC
  Administered 2022-05-12 – 2022-05-13 (×2): 100 mg via ORAL
  Filled 2022-05-12 (×2): qty 1

## 2022-05-12 MED ORDER — FOLIC ACID 1 MG PO TABS
1.0000 mg | ORAL_TABLET | Freq: Every day | ORAL | Status: DC
  Administered 2022-05-12 – 2022-05-13 (×2): 1 mg via ORAL
  Filled 2022-05-12 (×2): qty 1

## 2022-05-12 MED ORDER — CHLORDIAZEPOXIDE HCL 5 MG PO CAPS
25.0000 mg | ORAL_CAPSULE | Freq: Three times a day (TID) | ORAL | Status: DC | PRN
  Administered 2022-05-12 – 2022-05-13 (×2): 25 mg via ORAL
  Filled 2022-05-12 (×2): qty 5

## 2022-05-12 MED ORDER — CIPROFLOXACIN IN D5W 400 MG/200ML IV SOLN
400.0000 mg | Freq: Two times a day (BID) | INTRAVENOUS | Status: DC
  Administered 2022-05-13: 400 mg via INTRAVENOUS
  Filled 2022-05-12: qty 200

## 2022-05-12 NOTE — ED Triage Notes (Signed)
Per EMS- Patient was in the ED yesterday and left AMA. Patient's mother reports that the patient was suppose to have a paracentesis today. Patient c/o abdominal pain, swelling, and vomiting. Patient smells of ETOH.

## 2022-05-12 NOTE — Plan of Care (Signed)

## 2022-05-12 NOTE — ED Provider Notes (Signed)
[facility]-EMERGENCY DEPT Provider Note   CSN: [id number] Arrival date & time: 05/12/22  1033     History  Chief Complaint  Patient presents with   Abdominal Pain   Emesis   abdominal swelling    [name] is a 36 y.o. female.   Abdominal Pain Associated symptoms: vomiting   Emesis Associated symptoms: abdominal pain     Patient with medical history of hypertension urgency, polysubstance use disorder, alcohol dependence, cirrhosis, previous CVAs, hypertension, anxiety presents today due to abdominal pain, nausea and vomiting.  Patient is currently altered and is a very poor historian, she says she "hurts everywhere".  She has been vomiting "for a very long time".  She was brought in by EMS, per EMS patient was in the ED yesterday and left AMA prior to having paracentesis performed today.  Additional history obtained through  chart review review.  She was admitted 05/01/22 - left AMA 05/11/22..   Admitted for multiple fluid collection in the pelvic area of unclear origin, PID versus GI (strong family history of Crohn's disease) versus other. Repeat CT scan a week after admission showed persistent multiple fluid collections and couple new ones appeared despite antibiotics.  IR was consulted and she was supposed to get percutaneous drainage on 9/19, however on 9/18, patient decided to leave AMA, alert and oriented x4 and fully understanding the risks.  Hospital course complicated by acute multiple punctate CVAs, hypertensive urgency.  She has a history of polysubstance abuse and had some withdrawal initially but that has resolved.    Home Medications Prior to Admission medications   Medication Sig Start Date End Date Taking? Authorizing Provider  acetaminophen (TYLENOL) 325 MG tablet Take 325-650 mg by mouth every 6 (six) hours as needed for mild pain or headache.    [provider]  chlordiazePOXIDE (LIBRIUM) 25 MG capsule Take 1 capsule (25 mg  total) by mouth 3 (three) times daily as needed for withdrawal. Patient not taking: Reported on 05/01/2022 08/04/18   Felicie Morn, NP  cloNIDine (CATAPRES) 0.1 MG tablet Take 1 tablet (0.1 mg total) by mouth 2 (two) times daily. 05/01/17   Elpidio Anis, PA-C  ibuprofen (ADVIL) 200 MG tablet Take 200-400 mg by mouth every 6 (six) hours as needed for headache or mild pain.    [provider]  omeprazole (PRILOSEC) 40 MG capsule Take 1 capsule (40 mg total) by mouth daily. Patient not taking: Reported on 05/01/2022 09/03/17   Ward, Layla Maw, DO  propranolol (INDERAL) 40 MG tablet Take 1 tablet (40 mg total) by mouth 2 (two) times daily. Patient not taking: Reported on 05/01/2022 05/01/17   Elpidio Anis, PA-C      Allergies    Ibuprofen, Latex, Peanut-containing drug products, Penicillins, Acetaminophen, Codeine, Lisinopril, and Ativan [lorazepam]    Review of Systems   Review of Systems  Gastrointestinal:  Positive for abdominal pain and vomiting.    Physical Exam Updated Vital Signs BP 136/79 (BP Location: Left Arm)   Pulse 98   Temp 98.3 F (36.8 C) (Oral)   Resp 16   Ht 5' (1.524 m)   Wt 72 kg   LMP 05/01/2022 (Approximate) Comment: negative hcg blood test 05-01-2022  SpO2 95%   BMI 31.00 kg/m  Physical Exam Vitals and nursing note reviewed. Exam conducted with a chaperone present.  Constitutional:      Appearance: Normal appearance.     Comments: Patient is somnolent but arousable .   HENT:  Head: Normocephalic and atraumatic.  Eyes:     General: No scleral icterus.       Right eye: No discharge.        Left eye: No discharge.     Extraocular Movements: Extraocular movements intact.     Pupils: Pupils are equal, round, and reactive to light.  Cardiovascular:     Rate and Rhythm: Normal rate and regular rhythm.     Pulses: Normal pulses.     Heart sounds: Normal heart sounds. No murmur heard.    No friction rub. No gallop.  Pulmonary:     Effort: Pulmonary  effort is normal. No respiratory distress.     Breath sounds: Normal breath sounds.  Abdominal:     General: Abdomen is flat. Bowel sounds are normal. There is no distension.     Palpations: Abdomen is soft.     Tenderness: There is abdominal tenderness in the right lower quadrant, suprapubic area and left lower quadrant. There is right CVA tenderness.  Skin:    General: Skin is warm and dry.     Coloration: Skin is not jaundiced.  Neurological:     Mental Status: She is alert. Mental status is at baseline.     Coordination: Coordination normal.     ED Results / Procedures / Treatments   Labs (all labs ordered are listed, but only abnormal results are displayed) Labs Reviewed  CBC WITH DIFFERENTIAL/PLATELET  COMPREHENSIVE METABOLIC PANEL  LIPASE, BLOOD  URINALYSIS, ROUTINE W REFLEX MICROSCOPIC  ETHANOL  I-STAT BETA HCG BLOOD, ED ([facility], [facility], [facility] ONLY)    EKG None  Radiology CT ABDOMEN PELVIS W CONTRAST  Result Date: 05/10/2022 CLINICAL DATA:  Lower abdominal pain, nausea and vomiting. EXAM: CT ABDOMEN AND PELVIS WITH CONTRAST TECHNIQUE: Multidetector CT imaging of the abdomen and pelvis was performed using the standard protocol following bolus administration of intravenous contrast. RADIATION DOSE REDUCTION: This exam was performed according to the departmental dose-optimization program which includes automated exposure control, adjustment of the mA and/or kV according to patient size and/or use of iterative reconstruction technique. CONTRAST:  132mL OMNIPAQUE IOHEXOL 300 MG/ML  SOLN COMPARISON:  CT abdomen pelvis dated 05/04/2022. FINDINGS: Lower chest: No acute abnormality. Hepatobiliary: There is an 8 mm cyst in the liver. No imaging follow-up is recommended for this finding. No gallstones, gallbladder wall thickening, or biliary dilatation. Pancreas: Unremarkable. No pancreatic ductal dilatation or surrounding inflammatory changes. Spleen: Normal in size without focal abnormality.  Adrenals/Urinary Tract: Adrenal glands are unremarkable. Kidneys are normal, without renal calculi, focal lesion, or hydronephrosis. Bladder is unremarkable. Stomach/Bowel: Stomach is within normal limits. Enteric contrast reaches the distal small bowel. The appendix is not definitely seen. No evidence of bowel obstruction. Vascular/Lymphatic: A few periaortic lymph nodes are at the upper limits of normal, measuring 10 mm in short axis (series 2, image 41 and image 49). A mesenteric lymph node is at the upper limits of normal, measuring 10 mm in short axis (series 2, image 53). No significant vascular findings are present. Reproductive: Multiple fluid collections with rim enhancement and associated inflammatory changes in the bilateral adnexa appear similar to prior exam. These inflammatory changes are in close proximity with a loop of small bowel and the sigmoid colon. A fluid collection in the left adnexa without associated rim enhancement has increased in size (4.7 x 2.8 x 3.4 cm on series 2, image 61 and series 4, image 38). A fluid collection near the sigmoid colon without rim enhancement is new  since prior exam (2.4 x 3.3 x 2.4 cm on series 2, image 63 and series 4, image 31). Other: No abdominal wall hernia or abnormality. Musculoskeletal: No acute or significant osseous findings. IMPRESSION: 1. Multiple rim enhancing fluid collections with associated inflammatory changes in the bilateral adnexa appear similar to prior exam and two fluid collections without significant rim enhancement are new since 05/04/2022. These findings are favored to reflect pelvic inflammatory disease as opposed to enteritis as the primary etiology. 2. Inflammatory changes involving a loop of small bowel and the sigmoid colon as well as multiple abdominal lymph nodes at the upper limits of normal are favored to be reactive. Electronically Signed   By: Romona Curls M.D.   On: 05/10/2022 16:06    Procedures Procedures     Medications Ordered in ED Medications - No data to display  ED Course/ Medical Decision Making/ A&P                           Medical Decision Making Amount and/or Complexity of Data Reviewed Labs: ordered.  Risk Prescription drug management. Decision regarding hospitalization.  Patient presents due to abdominal pain, nausea and vomiting.  Differential includes but not limited to sepsis, PID, Crohn's, electrolyte derangement, AKI.  Patient is intoxicated on exam but follows commands.  She is arousable.  Her abdomen has diffuse lower abdominal tenderness, she is afebrile and heart rate is elevated 98 but not technically tachycardic   -BP 136/79 (BP Location: Left Arm)   Pulse 98   Temp 98.3 F (36.8 C) (Oral)   Resp 16   Ht 5' (1.524 m)   Wt 72 kg   LMP 05/01/2022 (Approximate) Comment: negative hcg blood test 05-01-2022  SpO2 95%   BMI 31.00 kg/m   Patient presents due to nausea, vomiting and abdominal pain.  On exam she has diffuse abdominal tenderness, do not appreciate large amount of ascites.  There is no asterixis, she is arousable and answers some questions but she is not very cooperative.  He was saying she wants to sleep.  She is protecting her airway, follows some commands.    9/8-admission 9/11-persistent fever, CTA shows new small complex fluid collection in the pelvis, general surgery consulted 9/12-GI consulted due to concern for Crohn's 9/13-MRI of the brain showing multiple areas of restricted diffusion compatible with acute infarcts in the right pons, left putamen, right thalamus, corpus callosum and left frontal white matter. 9/19-left AMA   Significant imaging / results / micro data: CT abdomen pelvis, pelvic ultrasound 9/8-no significant acute findings CTA 9/11-new fluid and inflammatory changes in the lower abdomen and pelvis next to the uterus, loops of bowel without clear source.  This represents enteritis, colitis and possibly PID.  New small  complex fluid collection in the pelvis, early versus developing small abscess. Blood cultures 9/10-no growth to date, pending CT abdomen pelvis 9/17-multiple rim-enhancing fluid collections with inflammatory changes in the bilateral adnexa appears similar but slightly enlarged, 2 new fluid collections appeared.  Findings favored to reflect PID  Ordered, viewed and interpreted laboratory work-up. CBC without leukocytosis or anemia. CMP without gross electrolyte derangement, AKI or transaminitis. Ammonia negative, ethanol negative, PT/INR PTT normal.  Patient is not pregnant. UA still pending.  I have ordered IV NS bolus, IV 500 Flagyl, IV 400Cipro  Patient will need admission for continued pelvic inflammatory disease and IR paracentesis.  I have consulted hospitalist service who agrees with admission.  Final Clinical Impression(s) / ED Diagnoses Final diagnoses:  None    Rx / DC Orders ED Discharge Orders     None         Theron Arista, PA-C 05/12/22 1424    Mardene Sayer, MD 05/12/22 [phone]

## 2022-05-12 NOTE — H&P (Signed)
History and Physical    Patient: [name]:[id number] DOB: 1985/08/28 DOA: 05/12/2022 DOS: the patient was seen and examined on 05/12/2022 PCP: Patient, No Pcp Per  Patient coming from: Home  Chief Complaint:  Chief Complaint  Patient presents with   Abdominal Pain   Emesis   abdominal swelling   HPI: [name] is a 36 y.o. female with medical history significant of CVA, cirrhosis of the liver, HTN, cocaine abuse, EtOH abuse. Presenting with  N/V, abdominal pain. She was recently in the hospital for the same, but left AMA on 9/18. She apparently went home and drank. She had worsening pain this morning. When her symptoms did not improve, she decided to come back to the hospital for assistance. She denies any other aggravating or alleviating factors.   Review of Systems: As mentioned in the history of present illness. All other systems reviewed and are negative. Past Medical History:  Diagnosis Date   Anxiety    Cirrhosis of liver (HCC)    Cocaine use 08/04/2018   History of cocaine use 08/04/2018   Hypertension    Polysubstance abuse (HCC)    Renal disorder    Kidney Infection    Past Surgical History:  Procedure Laterality Date   ARTERY REPAIR     CESAREAN SECTION     x 5   MOUTH SURGERY     TEAR DUCT PROBING     TUBAL LIGATION     VENTRAL HERNIA REPAIR N/A 10/03/2016   Procedure: OPEN INCARCERATED HERNIA REPAIR VENTRAL ADULT;  Surgeon: Axel Filler, MD;  Location: [facility] OR;  Service: General;  Laterality: N/A;   Social History:  reports that she has been smoking cigarettes. She has been smoking an average of .5 packs per day. She has never used smokeless tobacco. She reports current alcohol use. She reports that she does not currently use drugs after having used the following drugs: Cocaine.  Allergies  Allergen Reactions   Ibuprofen Anaphylaxis, Swelling and Other (See Comments)    Patient thinks it may have caused her throat to "swell"  (** PATIENT  HAS BEEN TOLERATING THIS IN 2023**)   Latex Shortness Of Breath   Peanut-Containing Drug Products Anaphylaxis   Penicillins Anaphylaxis and Hives    Has patient had a PCN reaction causing immediate rash, facial/tongue/throat swelling, SOB or lightheadedness with hypotension: Yes Has patient had a PCN reaction causing severe rash involving mucus membranes or skin necrosis: unknown Has patient had a PCN reaction that required hospitalization No Has patient had a PCN reaction occurring within the last 10 years: No If all of the above answers are "NO", then may proceed with Cephalosporin use. Tolerated ceftriaxone 2018 & 2019    Acetaminophen Other (See Comments)    Causes patient to have an upset stomach- Tolerating this in 2023 (325 mg tablets)   Codeine Hives and Itching   Lisinopril Swelling   Ativan [Lorazepam] Other (See Comments)    "Hallucinations," per patient    Family History  Problem Relation Age of Onset   CAD Other    Diabetes Other    Hypertension Other    Cancer Other    Thyroid disease Other     Prior to Admission medications   Medication Sig Start Date End Date Taking? Authorizing Provider  acetaminophen (TYLENOL) 325 MG tablet Take 325-650 mg by mouth every 6 (six) hours as needed for mild pain or headache.    [provider]  chlordiazePOXIDE (LIBRIUM) 25 MG capsule  Take 1 capsule (25 mg total) by mouth 3 (three) times daily as needed for withdrawal. Patient not taking: Reported on 05/01/2022 08/04/18   Felicie Morn, NP  cloNIDine (CATAPRES) 0.1 MG tablet Take 1 tablet (0.1 mg total) by mouth 2 (two) times daily. 05/01/17   Elpidio Anis, PA-C  ibuprofen (ADVIL) 200 MG tablet Take 200-400 mg by mouth every 6 (six) hours as needed for headache or mild pain.    [provider]  omeprazole (PRILOSEC) 40 MG capsule Take 1 capsule (40 mg total) by mouth daily. Patient not taking: Reported on 05/01/2022 09/03/17   Ward, Layla Maw, DO  propranolol (INDERAL)  40 MG tablet Take 1 tablet (40 mg total) by mouth 2 (two) times daily. Patient not taking: Reported on 05/01/2022 05/01/17   Elpidio Anis, PA-C    Physical Exam: Vitals:   05/12/22 1041 05/12/22 1101 05/12/22 1330  BP: 136/79  128/87  Pulse: 98  79  Resp: 16  15  Temp: 98.3 F (36.8 C)    TempSrc: Oral    SpO2: 95%  92%  Weight:  72 kg   Height:  5' (1.524 m)    General: 36 y.o. female resting in chair in NAD Eyes: PERRL, normal sclera ENMT: Nares patent w/o discharge, orophaynx clear, dentition normal, ears w/o discharge/lesions/ulcers Neck: Supple, trachea midline Cardiovascular: RRR, +S1, S2, no m/g/r, equal pulses throughout Respiratory: CTABL, no w/r/r, normal WOB GI: BS+, distended but soft, NT, no masses noted MSK: No e/c/c Neuro: A&O x 3, no focal deficits Psyc: pacing room, she is scattered but cooperative  Data Reviewed:  Results for orders placed or performed during the hospital encounter of 05/12/22 (from the past 24 hour(s))  CBC with Differential     Status: Abnormal   Collection Time: 05/12/22  1:23 PM  Result Value Ref Range   WBC 7.4 4.0 - 10.5 K/uL   RBC 4.39 3.87 - 5.11 MIL/uL   Hemoglobin 13.6 12.0 - 15.0 g/dL   HCT 88.8 28.0 - 03.4 %   MCV 92.9 80.0 - 100.0 fL   MCH 31.0 26.0 - 34.0 pg   MCHC 33.3 30.0 - 36.0 g/dL   RDW 91.7 91.5 - 05.6 %   Platelets 830 (H) 150 - 400 K/uL   nRBC 0.0 0.0 - 0.2 %   Neutrophils Relative % 67 %   Neutro Abs 5.0 1.7 - 7.7 K/uL   Lymphocytes Relative 22 %   Lymphs Abs 1.6 0.7 - 4.0 K/uL   Monocytes Relative 7 %   Monocytes Absolute 0.5 0.1 - 1.0 K/uL   Eosinophils Relative 2 %   Eosinophils Absolute 0.1 0.0 - 0.5 K/uL   Basophils Relative 1 %   Basophils Absolute 0.0 0.0 - 0.1 K/uL   Immature Granulocytes 1 %   Abs Immature Granulocytes 0.04 0.00 - 0.07 K/uL  Comprehensive metabolic panel     Status: Abnormal   Collection Time: 05/12/22  1:23 PM  Result Value Ref Range   Sodium 140 135 - 145 mmol/L   Potassium  4.2 3.5 - 5.1 mmol/L   Chloride 107 98 - 111 mmol/L   CO2 24 22 - 32 mmol/L   Glucose, Bld 105 (H) 70 - 99 mg/dL   BUN 9 6 - 20 mg/dL   Creatinine, Ser 9.79 0.44 - 1.00 mg/dL   Calcium 9.6 8.9 - 48.0 mg/dL   Total Protein 7.9 6.5 - 8.1 g/dL   Albumin 3.7 3.5 - 5.0 g/dL   AST  31 15 - 41 U/L   ALT 25 0 - 44 U/L   Alkaline Phosphatase 119 38 - 126 U/L   Total Bilirubin 0.3 0.3 - 1.2 mg/dL   GFR, Estimated >33 >54 mL/min   Anion gap 9 5 - 15  Lipase, blood     Status: None   Collection Time: 05/12/22  1:23 PM  Result Value Ref Range   Lipase 37 11 - 51 U/L  Ethanol     Status: None   Collection Time: 05/12/22  1:23 PM  Result Value Ref Range   Alcohol, Ethyl (B) <10 <10 mg/dL  Protime-INR     Status: None   Collection Time: 05/12/22  1:23 PM  Result Value Ref Range   Prothrombin Time 14.2 11.4 - 15.2 seconds   INR 1.1 0.8 - 1.2  APTT     Status: None   Collection Time: 05/12/22  1:23 PM  Result Value Ref Range   aPTT 31 24 - 36 seconds  Ammonia     Status: None   Collection Time: 05/12/22  1:24 PM  Result Value Ref Range   Ammonia 24 9 - 35 umol/L  Type and screen [facility]     Status: None   Collection Time: 05/12/22  1:25 PM  Result Value Ref Range   ABO/RH(D) O NEG    Antibody Screen NEG    Sample Expiration      05/15/2022,2359 Performed at [facility], [address], [place], [place] [address]   I-Stat beta hCG blood, ED     Status: None   Collection Time: 05/12/22  1:33 PM  Result Value Ref Range   I-stat hCG, quantitative <5.0 <5 mIU/mL   Comment 3           Assessment and Plan: PID     - admit to inpt, tele     - continue cipro, flagyl     - IR consulted for abscess drain; send for Cx     - consider ID consult while she is here  Recent acute multiple punctate CVAs     - after her IR procedure, continue DAPT to complete 90 days and then ASA alone as per previous neuro rec     - continue secondary preventative  measures  EtOH abuse Cocaine abuse Tobacco abuse     - counsel against further use     - she finished a librium taper on her previous visit and no withdrawal symptoms were present at discharge     - monitor for now     - nicotine patch  HTN     - resume home regimen  Advance Care Planning:   Code Status: FULL  Consults: IR  Family Communication: None at bedside  Severity of Illness: The appropriate patient status for this patient is INPATIENT. Inpatient status is judged to be reasonable and necessary in order to provide the required intensity of service to ensure the patient's safety. The patient's presenting symptoms, physical exam findings, and initial radiographic and laboratory data in the context of their chronic comorbidities is felt to place them at high risk for further clinical deterioration. Furthermore, it is not anticipated that the patient will be medically stable for discharge from the hospital within 2 midnights of admission.   * I certify that at the point of admission it is my clinical judgment that the patient will require inpatient hospital care spanning beyond 2 midnights from the point of admission  due to high intensity of service, high risk for further deterioration and high frequency of surveillance required.*  Time spent in coordination of this H&P: 60 minutes  Author: Jonnie Finner, DO 05/12/2022 2:53 PM  For on call review [email or web address].

## 2022-05-12 NOTE — Discharge Summary (Signed)
Physician AGAINST MEDICAL ADVICE discharge Summary  [name] ZOX:[id number] DOB: Dec 19, 1985 DOA: 05/01/2022  PCP: Patient, No Pcp Per  Admit date: 05/01/2022 Discharge date: 05/12/2022  Admitted From: Home Disposition: Left AGAINST MEDICAL ADVICE  Discharge Condition: Guarded given intrapelvic infections CODE STATUS: Full code   HPI: Per admitting MD, [name] is a 36 y.o. female with medical history significant of anxiety, liver cirrhosis, cocaine abuse, hypertension, unspecified renal disorder, history of other nonhemorrhagic CVA, ventral hernia, constipation who is coming to the emergency department due to abdominal pain associated with nausea, dry heaving and constipation for the past 3 days.  No diarrhea, melena or hematochezia.  She has had chills, but no fever or night sweats. No sore throat, rhinorrhea, dyspnea, wheezing or hemoptysis.  No chest pain, palpitations, diaphoresis, PND, orthopnea or pitting edema of the lower extremities. No flank pain, dysuria, frequency or hematuria.  No polyuria, polydipsia, polyphagia or blurred vision.  She drinks about 40 ounces of beer daily.   Hospital Course / Discharge diagnoses: Principal Problem:   Hypertensive urgency Active Problems:   Alcohol withdrawal (HCC)   Substance abuse (HCC)   Hepatic steatosis   Abdominal pain   Nausea and vomiting   Cocaine abuse (HCC)   Alcohol dependence (HCC)   Hypokalemia   Pelvic abscess in female  For full hospital course, please see progress note from the same day of this discharge summary.  She came into the hospital with abdominal pain, was found to have multiple fluid collection in the pelvic area of unclear origin, PID versus GI (strong family history of Crohn's disease) versus other.  Gastroenterology, general surgery, gynecology as well as ID consulted.  Repeat CT scan a week after admission showed persistent multiple fluid collections and couple new ones appeared despite  antibiotics.  IR was consulted and she was supposed to get percutaneous drainage on 9/19, however on 9/18, patient decided to leave AMA, alert and oriented x4 and fully understanding the risks.  Hospital course complicated by acute multiple punctate CVAs, hypertensive urgency.  She has a history of polysubstance abuse and had some withdrawal initially but that has resolved.  Significant events: 9/8-admission 9/11-persistent fever, CTA shows new small complex fluid collection in the pelvis, general surgery consulted 9/12-GI consulted due to concern for Crohn's 9/13-MRI of the brain showing multiple areas of restricted diffusion compatible with acute infarcts in the right pons, left putamen, right thalamus, corpus callosum and left frontal white matter. 9/19-left AMA   Significant imaging / results / micro data: CT abdomen pelvis, pelvic ultrasound 9/8-no significant acute findings CTA 9/11-new fluid and inflammatory changes in the lower abdomen and pelvis next to the uterus, loops of bowel without clear source.  This represents enteritis, colitis and possibly PID.  New small complex fluid collection in the pelvis, early versus developing small abscess. Blood cultures 9/10-no growth to date, pending CT abdomen pelvis 9/17-multiple rim-enhancing fluid collections with inflammatory changes in the bilateral adnexa appears similar but slightly enlarged, 2 new fluid collections appeared.  Findings favored to reflect PID  Sepsis ruled out   Discharge Instructions  Discharge Instructions     Ambulatory referral to Neurology   Complete by: As directed    An appointment is requested in approximately: 6 wks      Allergies as of 05/11/2022       Reactions   Ibuprofen Anaphylaxis, Swelling, Other (See Comments)   Patient thinks it may have caused her throat to "swell" (** PATIENT  HAS BEEN TOLERATING THIS IN 2023**)   Latex Shortness Of Breath   Peanut-containing Drug Products Anaphylaxis    Penicillins Anaphylaxis, Hives   Has patient had a PCN reaction causing immediate rash, facial/tongue/throat swelling, SOB or lightheadedness with hypotension: Yes Has patient had a PCN reaction causing severe rash involving mucus membranes or skin necrosis: unknown Has patient had a PCN reaction that required hospitalization No Has patient had a PCN reaction occurring within the last 10 years: No If all of the above answers are "NO", then may proceed with Cephalosporin use. Tolerated ceftriaxone 2018 & 2019   Acetaminophen Other (See Comments)   Causes patient to have an upset stomach- Tolerating this in 2023 (325 mg tablets)   Codeine Hives, Itching   Lisinopril Swelling   Ativan [lorazepam] Other (See Comments)   "Hallucinations," per patient        Medication List     ASK your doctor about these medications    acetaminophen 325 MG tablet Commonly known as: TYLENOL Take 325-650 mg by mouth every 6 (six) hours as needed for mild pain or headache.   chlordiazePOXIDE 25 MG capsule Commonly known as: LIBRIUM Take 1 capsule (25 mg total) by mouth 3 (three) times daily as needed for withdrawal.   cloNIDine 0.1 MG tablet Commonly known as: CATAPRES Take 1 tablet (0.1 mg total) by mouth 2 (two) times daily. Ask about: Which instructions should I use?   ibuprofen 200 MG tablet Commonly known as: ADVIL Take 200-400 mg by mouth every 6 (six) hours as needed for headache or mild pain.   omeprazole 40 MG capsule Commonly known as: PRILOSEC Take 1 capsule (40 mg total) by mouth daily.   propranolol 40 MG tablet Commonly known as: INDERAL Take 1 tablet (40 mg total) by mouth 2 (two) times daily.        Follow-up Information     [facility]. Go on 05/28/2022.   Specialty: Internal Medicine Why: Please go to PCP appointment on [facility] address listed above. Contact information: [address] [place] [id number] [address]c [place] [address] [phone]                Consultations: Gastroenterology, general surgery, interventional radiology, infectious disease  Procedures/Studies:  CT ABDOMEN PELVIS W CONTRAST  Result Date: 05/10/2022 CLINICAL DATA:  Lower abdominal pain, nausea and vomiting. EXAM: CT ABDOMEN AND PELVIS WITH CONTRAST TECHNIQUE: Multidetector CT imaging of the abdomen and pelvis was performed using the standard protocol following bolus administration of intravenous contrast. RADIATION DOSE REDUCTION: This exam was performed according to the departmental dose-optimization program which includes automated exposure control, adjustment of the mA and/or kV according to patient size and/or use of iterative reconstruction technique. CONTRAST:  OMNIPAQUE IOHEXOL 300 MG/ML  SOLN COMPARISON:  CT abdomen pelvis dated 05/04/2022. FINDINGS: Lower chest: No acute abnormality. Hepatobiliary: There is an 8 mm cyst in the liver. No imaging follow-up is recommended for this finding. No gallstones, gallbladder wall thickening, or biliary dilatation. Pancreas: Unremarkable. No pancreatic ductal dilatation or surrounding inflammatory changes. Spleen: Normal in size without focal abnormality. Adrenals/Urinary Tract: Adrenal glands are unremarkable. Kidneys are normal, without renal calculi, focal lesion, or hydronephrosis. Bladder is unremarkable. Stomach/Bowel: Stomach is within normal limits. Enteric contrast reaches the distal small bowel. The appendix is not definitely seen. No evidence of bowel obstruction. Vascular/Lymphatic: A few periaortic lymph nodes are at the upper limits of normal, measuring 10 mm in short axis (  series 2, image 41 and image 49). A mesenteric lymph node is at the upper limits of normal, measuring 10 mm in short axis (series 2, image 53). No significant vascular findings are present. Reproductive: Multiple fluid collections with rim enhancement and associated inflammatory changes in the  bilateral adnexa appear similar to prior exam. These inflammatory changes are in close proximity with a loop of small bowel and the sigmoid colon. A fluid collection in the left adnexa without associated rim enhancement has increased in size (4.7 x 2.8 x 3.4 cm on series 2, image 61 and series 4, image 38). A fluid collection near the sigmoid colon without rim enhancement is new since prior exam (2.4 x 3.3 x 2.4 cm on series 2, image 63 and series 4, image 31). Other: No abdominal wall hernia or abnormality. Musculoskeletal: No acute or significant osseous findings. IMPRESSION: 1. Multiple rim enhancing fluid collections with associated inflammatory changes in the bilateral adnexa appear similar to prior exam and two fluid collections without significant rim enhancement are new since 05/04/2022. These findings are favored to reflect pelvic inflammatory disease as opposed to enteritis as the primary etiology. 2. Inflammatory changes involving a loop of small bowel and the sigmoid colon as well as multiple abdominal lymph nodes at the upper limits of normal are favored to be reactive. Electronically Signed   By: Romona Curls M.D.   On: 05/10/2022 16:06   ECHOCARDIOGRAM LIMITED BUBBLE STUDY  Result Date: 05/07/2022    ECHOCARDIOGRAM LIMITED REPORT   Patient Name:   [name] Date of Exam: 05/07/2022 Medical Rec #:  [id number]          Height:       60.0 in Accession #:    [id number]         Weight:       158.7 lb Date of Birth:  05-15-1986           BSA:          1.692 m Patient Age:    36 years           BP:           135/84 mmHg Patient Gender: F                  HR:           76 bpm. Exam Location:  Inpatient Procedure: Limited Echo and Saline Contrast Bubble Study Indications:    stroke  History:        Patient has prior history of Echocardiogram examinations, most                 recent 05/06/2022. Stroke.  Sonographer:    Cathie Hoops Referring Phys: [id number] Wiggins Carl STACK IMPRESSIONS  1. Limited bubble  study for stroke  2. Left ventricular ejection fraction, by estimation, is 60 to 65%. The left ventricle has normal function.  3. Agitated saline contrast bubble study was negative, with no evidence of any interatrial shunt. Comparison(s): 05/06/2022: LVEF 60-65%. FINDINGS  Left Ventricle: Left ventricular ejection fraction, by estimation, is 60 to 65%. The left ventricle has normal function. IAS/Shunts: No atrial level shunt detected by color flow Doppler. Agitated saline contrast was given intravenously to evaluate for intracardiac shunting. Agitated saline contrast bubble study was negative, with no evidence of any interatrial shunt. [name] MD Electronically signed by [name] MD Signature Date/Time: 05/07/2022/2:51:35 PM    Final    DG Pelvis 1-2 Views  Result Date: 05/07/2022 CLINICAL DATA:  Trauma, fall EXAM: PELVIS - 1-2 VIEW COMPARISON:  None Available. FINDINGS: No fracture or dislocation is seen.  SI joints are symmetrical. IMPRESSION: No fracture or dislocation is seen. Electronically Signed   By: Ernie Avena M.D.   On: 05/07/2022 09:36   CT ANGIO HEAD W OR WO CONTRAST  Result Date: 05/06/2022 CLINICAL DATA:  Stroke follow-up. EXAM: CT ANGIOGRAPHY HEAD AND NECK TECHNIQUE: Multidetector CT imaging of the head and neck was performed using the standard protocol during bolus administration of intravenous contrast. Multiplanar CT image reconstructions and MIPs were obtained to evaluate the vascular anatomy. Carotid stenosis measurements (when applicable) are obtained utilizing NASCET criteria, using the distal internal carotid diameter as the denominator. RADIATION DOSE REDUCTION: This exam was performed according to the departmental dose-optimization program which includes automated exposure control, adjustment of the mA and/or kV according to patient size and/or use of iterative reconstruction technique. CONTRAST:  77mL OMNIPAQUE IOHEXOL 350 MG/ML SOLN COMPARISON:  Head and neck CTA  03/21/2020 FINDINGS: CTA NECK FINDINGS Aortic arch: Standard 3 vessel aortic arch. Widely patent arch vessel origins. Right carotid system: Patent and smooth without evidence of stenosis or dissection. Tortuous proximal ICA. Left carotid system: Patent and smooth without evidence of stenosis or dissection. Tortuous mid cervical ICA. Vertebral arteries: Patent, smooth, and codominant without evidence of stenosis or dissection. Skeleton: Widespread dental caries and periapical lucencies. Other neck: No evidence of cervical lymphadenopathy or mass. Upper chest: Clear lung apices. Review of the MIP images confirms the above findings CTA HEAD FINDINGS Anterior circulation: The internal carotid arteries are widely patent from skull base to carotid termini. ACAs and MCAs are patent without evidence of a proximal branch occlusion or significant proximal stenosis. There is a new severe distal left A2 stenosis. No aneurysm is identified. Posterior circulation: The intracranial vertebral arteries are widely patent to the basilar. Patent PICA, AICA, and SCA origins are seen bilaterally. The basilar artery is widely patent. There is a large left posterior communicating artery with absent left P1 segment. Both PCAs are patent without evidence of a significant proximal stenosis. No aneurysm is identified. Venous sinuses: Patent. Anatomic variants: Fetal left PCA. Review of the MIP images confirms the above findings IMPRESSION: 1. No large vessel occlusion or significant proximal stenosis in the head or neck. 2. New severe distal left A2 stenosis. Electronically Signed   By: Sebastian Ache M.D.   On: 05/06/2022 18:17   CT ANGIO NECK W OR WO CONTRAST  Result Date: 05/06/2022 CLINICAL DATA:  Stroke follow-up. EXAM: CT ANGIOGRAPHY HEAD AND NECK TECHNIQUE: Multidetector CT imaging of the head and neck was performed using the standard protocol during bolus administration of intravenous contrast. Multiplanar CT image reconstructions  and MIPs were obtained to evaluate the vascular anatomy. Carotid stenosis measurements (when applicable) are obtained utilizing NASCET criteria, using the distal internal carotid diameter as the denominator. RADIATION DOSE REDUCTION: This exam was performed according to the departmental dose-optimization program which includes automated exposure control, adjustment of the mA and/or kV according to patient size and/or use of iterative reconstruction technique. CONTRAST:  33mL OMNIPAQUE IOHEXOL 350 MG/ML SOLN COMPARISON:  Head and neck CTA 03/21/2020 FINDINGS: CTA NECK FINDINGS Aortic arch: Standard 3 vessel aortic arch. Widely patent arch vessel origins. Right carotid system: Patent and smooth without evidence of stenosis or dissection. Tortuous proximal ICA. Left carotid system: Patent and smooth without evidence of stenosis or dissection. Tortuous mid cervical ICA. Vertebral arteries: Patent, smooth, and  codominant without evidence of stenosis or dissection. Skeleton: Widespread dental caries and periapical lucencies. Other neck: No evidence of cervical lymphadenopathy or mass. Upper chest: Clear lung apices. Review of the MIP images confirms the above findings CTA HEAD FINDINGS Anterior circulation: The internal carotid arteries are widely patent from skull base to carotid termini. ACAs and MCAs are patent without evidence of a proximal branch occlusion or significant proximal stenosis. There is a new severe distal left A2 stenosis. No aneurysm is identified. Posterior circulation: The intracranial vertebral arteries are widely patent to the basilar. Patent PICA, AICA, and SCA origins are seen bilaterally. The basilar artery is widely patent. There is a large left posterior communicating artery with absent left P1 segment. Both PCAs are patent without evidence of a significant proximal stenosis. No aneurysm is identified. Venous sinuses: Patent. Anatomic variants: Fetal left PCA. Review of the MIP images confirms  the above findings IMPRESSION: 1. No large vessel occlusion or significant proximal stenosis in the head or neck. 2. New severe distal left A2 stenosis. Electronically Signed   By: Sebastian Ache M.D.   On: 05/06/2022 18:17   ECHOCARDIOGRAM COMPLETE  Result Date: 05/06/2022    ECHOCARDIOGRAM REPORT   Patient Name:   [name] Date of Exam: 05/06/2022 Medical Rec #:  [id number]          Height:       60.0 in Accession #:    [id number]         Weight:       158.7 lb Date of Birth:  [birth year]           BSA:          1.692 m Patient Age:    36 years           BP:           117/77 mmHg Patient Gender: F                  HR:           80 bpm. Exam Location:  Inpatient Procedure: 2D Echo, Cardiac Doppler and Color Doppler Indications:    Stroke  History:        Patient has prior history of Echocardiogram examinations, most                 recent 03/21/2020. Risk Factors:Hypertension.  Sonographer:    Eduard Roux Referring Phys: [id number] Daylene Katayama Kensli Bowley IMPRESSIONS  1. Left ventricular ejection fraction, by estimation, is 60 to 65%. The left ventricle has normal function. The left ventricle has no regional wall motion abnormalities. There is mild concentric left ventricular hypertrophy. Left ventricular diastolic parameters are consistent with Grade II diastolic dysfunction (pseudonormalization).  2. Right ventricular systolic function is normal. The right ventricular size is normal. There is normal pulmonary artery systolic pressure. The estimated right ventricular systolic pressure is 16.2 mmHg.  3. Left atrial size was mildly dilated.  4. The mitral valve is normal in structure. No evidence of mitral valve regurgitation. No evidence of mitral stenosis.  5. The aortic valve is tricuspid. Aortic valve regurgitation is not visualized. No aortic stenosis is present.  6. The inferior vena cava is normal in size with greater than 50% respiratory variability, suggesting right atrial pressure of 3 mmHg. FINDINGS  Left  Ventricle: Left ventricular ejection fraction, by estimation, is 60 to 65%. The left ventricle has normal function. The left ventricle has no regional wall motion abnormalities. The left  ventricular internal cavity size was normal in size. There is  mild concentric left ventricular hypertrophy. Left ventricular diastolic parameters are consistent with Grade II diastolic dysfunction (pseudonormalization). Right Ventricle: The right ventricular size is normal. No increase in right ventricular wall thickness. Right ventricular systolic function is normal. There is normal pulmonary artery systolic pressure. The tricuspid regurgitant velocity is 1.82 m/s, and  with an assumed right atrial pressure of 3 mmHg, the estimated right ventricular systolic pressure is 16.2 mmHg. Left Atrium: Left atrial size was mildly dilated. Right Atrium: Right atrial size was normal in size. Pericardium: Trivial pericardial effusion is present. Mitral Valve: The mitral valve is normal in structure. No evidence of mitral valve regurgitation. No evidence of mitral valve stenosis. Tricuspid Valve: The tricuspid valve is normal in structure. Tricuspid valve regurgitation is trivial. Aortic Valve: The aortic valve is tricuspid. Aortic valve regurgitation is not visualized. No aortic stenosis is present. Aortic valve peak gradient measures 10.4 mmHg. Pulmonic Valve: The pulmonic valve was normal in structure. Pulmonic valve regurgitation is trivial. Aorta: The aortic root is normal in size and structure. Venous: The inferior vena cava is normal in size with greater than 50% respiratory variability, suggesting right atrial pressure of 3 mmHg. IAS/Shunts: No atrial level shunt detected by color flow Doppler.  LEFT VENTRICLE PLAX 2D LVIDd:         4.30 cm   Diastology LVIDs:         2.90 cm   LV e' medial:    5.63 cm/s LV PW:         1.50 cm   LV E/e' medial:  17.8 LV IVS:        1.50 cm   LV e' lateral:   7.51 cm/s LVOT diam:     1.90 cm   LV E/e'  lateral: 13.3 LV SV:         75 LV SV Index:   45 LVOT Area:     2.84 cm  RIGHT VENTRICLE             IVC RV Basal diam:  2.90 cm     IVC diam: 1.50 cm RV S prime:     13.00 cm/s TAPSE (M-mode): 2.5 cm LEFT ATRIUM             Index        RIGHT ATRIUM           Index LA diam:        3.10 cm 1.83 cm/m   RA Area:     15.40 cm LA Vol (A2C):   53.7 ml 31.74 ml/m  RA Volume:   38.90 ml  22.99 ml/m LA Vol (A4C):   62.7 ml 37.06 ml/m LA Biplane Vol: 59.3 ml 35.05 ml/m  AORTIC VALVE                 PULMONIC VALVE AV Area (Vmax): 2.40 cm     PV Vmax:       0.75 m/s AV Vmax:        161.00 cm/s  PV Peak grad:  2.2 mmHg AV Peak Grad:   10.4 mmHg LVOT Vmax:      136.00 cm/s LVOT Vmean:     99.100 cm/s LVOT VTI:       0.266 m  AORTA Ao Root diam: 3.40 cm Ao Asc diam:  3.30 cm MITRAL VALVE                TRICUSPID  VALVE MV Area (PHT): 3.06 cm     TR Peak grad:   13.2 mmHg MV Decel Time: 248 msec     TR Vmax:        182.00 cm/s MV E velocity: 100.00 cm/s MV A velocity: 87.50 cm/s   SHUNTS MV E/A ratio:  1.14         Systemic VTI:  0.27 m                             Systemic Diam: 1.90 cm Dalton McleanMD Electronically signed by Wilfred Lacy Signature Date/Time: 05/06/2022/5:45:29 PM    Final    MR BRAIN WO CONTRAST  Result Date: 05/06/2022 CLINICAL DATA:  Acute neuro deficit. EXAM: MRI HEAD WITHOUT CONTRAST TECHNIQUE: Multiplanar, multiecho pulse sequences of the brain and surrounding structures were obtained without intravenous contrast. COMPARISON:  CT head 05/06/2022.  MRI head 03/20/2020 FINDINGS: Brain: Multiple areas of restricted diffusion compatible with acute infarct. These are all small areas of infarct involving the right pons, left putamen, right thalamus, corpus callosum, and the left frontal white matter. Chronic infarcts in the pons especially on the right. Chronic lacunar infarcts in the thalamus bilaterally. Chronic infarcts in the basal ganglia bilaterally. Multiple foci of chronic hemorrhage  involving the pons. Chronic microhemorrhage in the right thalamus and right putamen. No hydrocephalus or mass lesion. Vascular: Normal arterial flow voids Skull and upper cervical spine: Negative Sinuses/Orbits: Mild mucosal edema paranasal sinuses. Negative orbit Other: None IMPRESSION: Numerous small areas of acute infarct as described above. Correlate with recent history of hypertension or hypotension. Cerebral emboli possible however no cortical infarct is seen as would be expected with emboli. Advanced for age chronic microvascular ischemia and multiple areas of chronic microhemorrhage. Correlate with risk factors for small vessel disease. Electronically Signed   By: Marlan Palau M.D.   On: 05/06/2022 14:51   CT HEAD WO CONTRAST ( )  Result Date: 05/06/2022 CLINICAL DATA:  Patient is unable to move the right foot this morning. 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 RADIATION DOSE REDUCTION: This exam was performed according to the departmental dose-optimization program which includes automated exposure control, adjustment of the mA and/or kV according to patient size and/or use of iterative reconstruction technique. COMPARISON:  CT brain 03/20/2020, MRI head 03/20/2020 FINDINGS: Brain: Chronic left thalamic and left lentiform nucleus lacunar infarcts. There is also a chronic infarct in the right thalamus. Compared to prior exams there is an age indeterminate hypodensity in the left caudate head (series 2, image 15). No evidence of hydrocephalus. No hemorrhage. No extra-axial fluid collections. Vascular: No hyperdense vessel or unexpected calcification. Skull: Normal. Negative for fracture or focal lesion. Sinuses/Orbits: No acute finding. Other: None. IMPRESSION: Compared to prior exam there is a new hypodensity in the left caudate head. Although  this has a chronic appearance, it was not visualized on prior imaging and is technically age indeterminate. If there is clinical concern for an acute infarct, this could be further assessed with a brain MRI. Electronically Signed   By: Lorenza Cambridge M.D.   On: 05/06/2022 12:01   CT Angio Abd/Pel w/ and/or w/o  Result Date: 05/04/2022 CLINICAL DATA:  Mid and lower abdominal pain. Evaluate for acute mesenteric ischemia. EXAM: CTA ABDOMEN AND PELVIS WITHOUT AND WITH CONTRAST TECHNIQUE: Multidetector  CT imaging of the abdomen and pelvis was performed using the standard protocol during bolus administration of intravenous contrast. Multiplanar reconstructed images and MIPs were obtained and reviewed to evaluate the vascular anatomy. RADIATION DOSE REDUCTION: This exam was performed according to the departmental dose-optimization program which includes automated exposure control, adjustment of the mA and/or kV according to patient size and/or use of iterative reconstruction technique. CONTRAST:  OMNIPAQUE IOHEXOL 350 MG/ML SOLN COMPARISON:  CT abdomen and pelvis  05/01/2022 and 08/04/2018 FINDINGS: VASCULAR Aorta: Normal caliber aorta without aneurysm, dissection, vasculitis or significant stenosis. Celiac: Patent without evidence of aneurysm, dissection, vasculitis or significant stenosis. Variant anatomy with only 2 main branches coming off the celiac trunk. The 2 main branches are the left gastric artery and the splenic artery. Common hepatic artery is coming off the left gastric artery in the expected location of an accessory left gastric artery. There is a small gastroduodenal artery. SMA: Patent without evidence of aneurysm, dissection, vasculitis or significant stenosis. Renals: Both renal arteries are patent without evidence of aneurysm, dissection, vasculitis, fibromuscular dysplasia or significant stenosis. IMA: Patent without evidence of aneurysm, dissection, vasculitis or significant stenosis. Inflow:  Patent without evidence of aneurysm, dissection, vasculitis or significant stenosis. Proximal Outflow: Bilateral common femoral and visualized portions of the superficial and profunda femoral arteries are patent without evidence of aneurysm, dissection, vasculitis or significant stenosis. Veins: Portal veins and main mesenteric veins are patent without filling defects. IVC and renal veins are patent. No gross abnormality to the iliac veins. Review of the MIP images confirms the above findings. NON-VASCULAR Lower chest: Mild dependent changes in the posterior right lower lobe. Otherwise, the lung bases are clear. Hepatobiliary: 8 mm hypodensity in the anterior liver is probably an incidental finding. Gallbladder is decompressed. No biliary dilatation. Pancreas: Unremarkable. No pancreatic ductal dilatation or surrounding inflammatory changes. Spleen: Normal in size without focal abnormality. Adrenals/Urinary Tract: Normal adrenal glands. Normal appearance of both kidneys hydronephrosis. 6 mm hypodensity in the right kidney lower pole on sequence 4, image 114 is too small to definitively characterize but likely represents a cyst. This structure does not require dedicated follow-up. No suspicious renal lesions. Mild wall thickening in the urinary bladder. Stomach/Bowel: Normal appearance of the stomach. Question a small hiatal hernia. New fluid and inflammatory changes in the lower abdomen and in the pelvis. Inflammatory changes are centered around uterus and adnexal structures and near loops of small bowel in the lower abdomen. Mildly dilated loops of small bowel in the lower abdomen, largest measuring up to 3.3 cm. Inflammatory changes are also the near the sigmoid colon. Appendix is not confidently identified. Lymphatic: Mildly prominent retroperitoneal lymph nodes. Index lymph node between the aorta and IVC on sequence 11 image 100 measures 1.2 cm in the short axis and this lymph node measured approximately 0.7 cm  on 08/04/2018. No significant lymph node enlargement in the pelvis. Reproductive: Fluid and stranding throughout the pelvis around the uterus and adnexal structures. No discrete adnexal lesion. Other: There is extensive stranding with a small amount of fluid throughout the lower abdomen and pelvis. Evidence for developing fluid collections in the pelvis. A collection along the left side of the pelvis on sequence 11 image 194 measures 4.8 x 2.7 cm. Evidence for small complex fluid collections in the pre rectal region on image 194. There is additional complex fluid between the uterus and right adnexal structures on image 172. Musculoskeletal: No acute bone abnormality. IMPRESSION: VASCULAR 1. Arterial structures are widely patent  without significant atherosclerotic disease. No evidence for arterial stenosis. 2. Variant celiac artery anatomy as described. NON-VASCULAR 1. 1. New fluid and inflammatory changes in the lower abdomen and pelvis. These inflammatory changes are near the uterus, adnexa and loops of bowel. Inflammation source is unclear. Differential diagnosis includes enteritis, colitis and possibly pelvic inflammatory disease. Dilatation of some small bowel loops suggests that small bowel could be the inflammatory source. 2. New small complex fluid collections in the pelvis. Early or developing small abscess collections cannot be excluded. Recommend short-term follow-up CT to evaluate these fluid collections. 3. Slightly enlarged retroperitoneal lymph nodes as described. Findings are nonspecific but could be reactive. Recommend attention to these on follow-up imaging. 4. Mild wall thickening in the urinary bladder is nonspecific due to incomplete distension and could be also be related to surrounding inflammatory changes. Electronically Signed   By: Richarda Overlie M.D.   On: 05/04/2022 14:19   US Pelvis Complete  Result Date: 05/01/2022 CLINICAL DATA:  Pelvic pain EXAM: TRANSABDOMINAL AND TRANSVAGINAL  ULTRASOUND OF PELVIS DOPPLER ULTRASOUND OF OVARIES TECHNIQUE: Both transabdominal and transvaginal ultrasound examinations of the pelvis were performed. Transabdominal technique was performed for global imaging of the pelvis including uterus, ovaries, adnexal regions, and pelvic cul-de-sac. It was necessary to proceed with endovaginal exam following the transabdominal exam to visualize the endometrium and ovaries. Color and duplex Doppler ultrasound was utilized to evaluate blood flow to the ovaries. COMPARISON:  CT abdomen 05/01/2022 FINDINGS: Uterus Measurements: 9.4 x 5 x 5.8 cm = volume: 140.8 mL. No fibroids or other mass visualized. Endometrium Thickness: 10 mm.  No focal abnormality visualized. Right ovary Measurements: 3.3 x 2.3 x 3.1 cm = volume: 12 mL. Normal appearance/no adnexal mass. Left ovary Measurements: 3 x 2.5 x 3.1 cm = volume: 12 mL. Normal appearance/no adnexal mass. Pulsed Doppler evaluation of both ovaries demonstrates normal low-resistance arterial and venous waveforms. Other findings No abnormal free fluid. IMPRESSION: 1. Normal pelvic ultrasound. 2. No sonographic findings to explain the patient's pelvic pain. Electronically Signed   By: Elige Ko M.D.   On: 05/01/2022 14:30   US Transvaginal Non-OB  Result Date: 05/01/2022 CLINICAL DATA:  Pelvic pain EXAM: TRANSABDOMINAL AND TRANSVAGINAL ULTRASOUND OF PELVIS DOPPLER ULTRASOUND OF OVARIES TECHNIQUE: Both transabdominal and transvaginal ultrasound examinations of the pelvis were performed. Transabdominal technique was performed for global imaging of the pelvis including uterus, ovaries, adnexal regions, and pelvic cul-de-sac. It was necessary to proceed with endovaginal exam following the transabdominal exam to visualize the endometrium and ovaries. Color and duplex Doppler ultrasound was utilized to evaluate blood flow to the ovaries. COMPARISON:  CT abdomen 05/01/2022 FINDINGS: Uterus Measurements: 9.4 x 5 x 5.8 cm = volume: 140.8  mL. No fibroids or other mass visualized. Endometrium Thickness: 10 mm.  No focal abnormality visualized. Right ovary Measurements: 3.3 x 2.3 x 3.1 cm = volume: 12 mL. Normal appearance/no adnexal mass. Left ovary Measurements: 3 x 2.5 x 3.1 cm = volume: 12 mL. Normal appearance/no adnexal mass. Pulsed Doppler evaluation of both ovaries demonstrates normal low-resistance arterial and venous waveforms. Other findings No abnormal free fluid. IMPRESSION: 1. Normal pelvic ultrasound. 2. No sonographic findings to explain the patient's pelvic pain. Electronically Signed   By: Elige Ko M.D.   On: 05/01/2022 14:30   [place] Art/Ven Flow Abd Pelv Doppler  Result Date: 05/01/2022 CLINICAL DATA:  Pelvic pain EXAM: TRANSABDOMINAL AND TRANSVAGINAL ULTRASOUND OF PELVIS DOPPLER ULTRASOUND OF OVARIES TECHNIQUE: Both transabdominal and transvaginal ultrasound examinations of  the pelvis were performed. Transabdominal technique was performed for global imaging of the pelvis including uterus, ovaries, adnexal regions, and pelvic cul-de-sac. It was necessary to proceed with endovaginal exam following the transabdominal exam to visualize the endometrium and ovaries. Color and duplex Doppler ultrasound was utilized to evaluate blood flow to the ovaries. COMPARISON:  CT abdomen 05/01/2022 FINDINGS: Uterus Measurements: 9.4 x 5 x 5.8 cm = volume: 140.8 mL. No fibroids or other mass visualized. Endometrium Thickness: 10 mm.  No focal abnormality visualized. Right ovary Measurements: 3.3 x 2.3 x 3.1 cm = volume: 12 mL. Normal appearance/no adnexal mass. Left ovary Measurements: 3 x 2.5 x 3.1 cm = volume: 12 mL. Normal appearance/no adnexal mass. Pulsed Doppler evaluation of both ovaries demonstrates normal low-resistance arterial and venous waveforms. Other findings No abnormal free fluid. IMPRESSION: 1. Normal pelvic ultrasound. 2. No sonographic findings to explain the patient's pelvic pain. Electronically Signed   By: Elige Ko M.D.    On: 05/01/2022 14:30   CT Abdomen Pelvis Wo Contrast  Result Date: 05/01/2022 CLINICAL DATA:  Three days of lower abdominal pain. Positive hematuria. Prior history of tubal ligation. EXAM: CT ABDOMEN AND PELVIS WITHOUT CONTRAST TECHNIQUE: Multidetector CT imaging of the abdomen and pelvis was performed following the standard protocol without IV contrast. RADIATION DOSE REDUCTION: This exam was performed according to the departmental dose-optimization program which includes automated exposure control, adjustment of the mA and/or kV according to patient size and/or use of iterative reconstruction technique. COMPARISON:  August 04, 2018 FINDINGS: Lower chest: No acute abnormality. Hepatobiliary: Diffuse hepatic steatosis. Subtle hypodensity along the anterior aspect of the liver measures 7 mm on image 14/2 and while technically too small to accurately characterize but is stable dating back to August 04, 2018 consistent with a benign finding. Cholelithiasis without findings of acute cholecystitis. No biliary ductal dilation. Pancreas: No pancreatic ductal dilation or evidence of acute inflammation. Spleen: No splenomegaly. Adrenals/Urinary Tract: Mild thickening of the bilateral adrenal glands without discrete nodularity, favor hyperplasia. No hydronephrosis. No renal, ureteral or bladder calculi. Urinary bladder is unremarkable for degree of distension. Stomach/Bowel: Hyperdense material in the colon traversing the rectum likely reflects ingested antidiarrheal agent. Stomach is unremarkable for degree of distension. No pathologic dilation of small or large bowel. The appendix appears normal. Colon is predominately decompressed limiting evaluation. Fibrofatty infiltration of the terminal ileum and colon likely reflect sequela of chronic inflammation. No evidence of acute bowel inflammation. Vascular/Lymphatic: Normal caliber abdominal aorta. Prominent retroperitoneal lymph nodes are similar dating back to  August 04, 2018 consistent with a benign/indolent process. Reproductive: Uterus and bilateral adnexa are unremarkable in noncontrast CT appearance for reproductive age female. Other: Trace pelvic free fluid is within physiologic normal limits. Musculoskeletal: No acute osseous abnormality. IMPRESSION: 1. No hydronephrosis or evidence of obstructive uropathy. 2. Hepatic steatosis. 3. Cholelithiasis without findings of acute cholecystitis. 4. Fibrofatty infiltration of the terminal ileum and colon as can be seen with chronic inflammation. No evidence of acute bowel inflammation. Electronically Signed   By: Maudry Mayhew M.D.   On: 05/01/2022 12:59     The results of significant diagnostics from this hospitalization (including imaging, microbiology, ancillary and laboratory) are listed below for reference.     Microbiology: Recent Results (from the past 240 hour(s))  Culture, blood (Routine X 2) w Reflex to ID Panel     Status: None   Collection Time: 05/03/22  5:30 AM   Specimen: Right Antecubital; Blood  Result Value Ref Range Status  Specimen Description   Final    RIGHT ANTECUBITAL BLOOD Performed at [facility], [place] [address] [place], [place] [address]    Special Requests   Final    AEROBIC BOTTLE ONLY Blood Culture results may not be optimal due to an inadequate volume of blood received in culture bottles Performed at [facility] [address] [place], [place] [address]    Culture   Final    NO GROWTH 5 DAYS Performed at [facility], [place] [address] [place], [place] [address]    Report Status 05/08/2022 FINAL  Final  Culture, blood (Routine X 2) w Reflex to ID Panel     Status: None   Collection Time: 05/03/22  5:31 AM   Specimen: Left Antecubital; Blood  Result Value Ref Range Status   Specimen Description   Final    LEFT ANTECUBITAL BLOOD Performed at [facility], [place] [address] [place], [place] [address]    Special Requests    Final    AEROBIC BOTTLE ONLY Blood Culture adequate volume Performed at [facility] [address], [place], [place] [address]    Culture   Final    NO GROWTH 5 DAYS Performed at [facility], [place] [address], [place], [place] [address]    Report Status 05/08/2022 FINAL  Final     Labs: Basic Metabolic Panel: Recent Labs  Lab 05/06/22 0456 05/07/22 0528 05/08/22 0452 05/10/22 0700  NA 136 135 138 137  K 4.1 3.6 3.4* 4.0  CL 106 105 106 104  CO2 21* 22 25 27   GLUCOSE 98 91 101* 99  BUN 15 12 10 10   CREATININE 0.69 0.60 0.50 0.64  CALCIUM 8.8* 8.7* 8.7* 8.9  MG  --  1.8 1.8  --   PHOS  --  4.0  --   --    Liver Function Tests: Recent Labs  Lab 05/07/22 0528 05/08/22 0452  AST 10* 12*  ALT 11 12  ALKPHOS 71 76  BILITOT 0.7 0.4  PROT 6.2* 6.2*  ALBUMIN 2.7* 2.5*   CBC: Recent Labs  Lab 05/06/22 0456 05/07/22 0528 05/08/22 0452 05/10/22 0700  WBC 6.9 5.5 5.6 5.3  HGB 11.3* 11.2* 10.2* 11.0*  HCT 35.9* 36.5 31.8* 35.0*  MCV 97.6 97.6 94.6 95.4  PLT 209 283 327 644*   CBG: No results for input(s): "GLUCAP" in the last 168 hours. Hgb A1c No results for input(s): "HGBA1C" in the last 72 hours. Lipid Profile No results for input(s): "CHOL", "HDL", "LDLCALC", "TRIG", "CHOLHDL", "LDLDIRECT" in the last 72 hours. Thyroid function studies No results for input(s): "TSH", "T4TOTAL", "T3FREE", "THYROIDAB" in the last 72 hours.  Invalid input(s): "FREET3" Urinalysis    Component Value Date/Time   COLORURINE AMBER (A) 05/01/2022 1025   APPEARANCEUR HAZY (A) 05/01/2022 1025   LABSPEC 1.030 05/01/2022 1025   PHURINE 5.0 05/01/2022 1025   GLUCOSEU NEGATIVE 05/01/2022 1025   HGBUR NEGATIVE 05/01/2022 1025   BILIRUBINUR NEGATIVE 05/01/2022 1025   KETONESUR 20 (A) 05/01/2022 1025   PROTEINUR 100 (A) 05/01/2022 1025   UROBILINOGEN 0.2 02/23/2013 1447   NITRITE NEGATIVE 05/01/2022 1025   LEUKOCYTESUR MODERATE (A) 05/01/2022 1025     FURTHER DISCHARGE INSTRUCTIONS:   Get Medicines reviewed and adjusted: Please take all your medications with you for your next visit with your Primary MD   Laboratory/radiological data: Please request your Primary MD to go over all hospital tests and procedure/radiological  results at the follow up, please ask your Primary MD to get all Hospital records sent to his/her office.   In some cases, they will be blood work, cultures and biopsy results pending at the time of your discharge. Please request that your primary care M.D. goes through all the records of your hospital data and follows up on these results.   Also Note the following: If you experience worsening of your admission symptoms, develop shortness of breath, life threatening emergency, suicidal or homicidal thoughts you must seek medical attention immediately by calling 911 or calling your MD immediately  if symptoms less severe.   You must read complete instructions/literature along with all the possible adverse reactions/side effects for all the Medicines you take and that have been prescribed to you. Take any new Medicines after you have completely understood and accpet all the possible adverse reactions/side effects.    Do not drive when taking Pain medications or sleeping medications (Benzodaizepines)   Do not take more than prescribed Pain, Sleep and Anxiety Medications. It is not advisable to combine anxiety,sleep and pain medications without talking with your primary care practitioner   Special Instructions: If you have smoked or chewed Tobacco  in the last 2 yrs please stop smoking, stop any regular Alcohol  and or any Recreational drug use.   Wear Seat belts while driving.   Please note: You were cared for by a hospitalist during your hospital stay. Once you are discharged, your primary care physician will handle any further medical issues. Please note that NO REFILLS for any discharge medications will be authorized  once you are discharged, as it is imperative that you return to your primary care physician (or establish a relationship with a primary care physician if you do not have one) for your post hospital discharge needs so that they can reassess your need for medications and monitor your lab values.  Time coordinating discharge: 10 minutes  SIGNED:  Pamella Pert, MD, PhD 05/12/2022, 6:18 AM

## 2022-05-12 NOTE — Progress Notes (Signed)
Patient ID: [name], female   DOB: 1986/01/30, 36 y.o.   MRN: [id number] IR team aware of new order for abd fluid coll asp/drainage on pt; she was seen by our team on 9/18 but left AMA last night; we will tent place on schedule for tomorrow time permitting; pt afebrile, WBC nl.

## 2022-05-13 ENCOUNTER — Inpatient Hospital Stay (HOSPITAL_COMMUNITY): Payer: Self-pay

## 2022-05-13 ENCOUNTER — Encounter (HOSPITAL_COMMUNITY): Payer: Self-pay | Admitting: Internal Medicine

## 2022-05-13 LAB — COMPREHENSIVE METABOLIC PANEL
ALT: 22 U/L (ref 0–44)
AST: 26 U/L (ref 15–41)
Albumin: 3.3 g/dL — ABNORMAL LOW (ref 3.5–5.0)
Alkaline Phosphatase: 91 U/L (ref 38–126)
Anion gap: 9 (ref 5–15)
BUN: 20 mg/dL (ref 6–20)
CO2: 23 mmol/L (ref 22–32)
Calcium: 9.3 mg/dL (ref 8.9–10.3)
Chloride: 111 mmol/L (ref 98–111)
Creatinine, Ser: 0.76 mg/dL (ref 0.44–1.00)
GFR, Estimated: >60 mL/min (ref >60)
Glucose, Bld: 98 mg/dL (ref 70–99)
Potassium: 4.5 mmol/L (ref 3.5–5.1)
Sodium: 143 mmol/L (ref 135–145)
Total Bilirubin: 0.3 mg/dL (ref 0.3–1.2)
Total Protein: 6.4 g/dL — ABNORMAL LOW (ref 6.5–8.1)

## 2022-05-13 LAB — CBC
HCT: 37.3 % (ref 36.0–46.0)
Hemoglobin: 11.7 g/dL — ABNORMAL LOW (ref 12.0–15.0)
MCH: 30.1 pg (ref 26.0–34.0)
MCHC: 31.4 g/dL (ref 30.0–36.0)
MCV: 95.9 fL (ref 80.0–100.0)
Platelets: 746 10*3/uL — ABNORMAL HIGH (ref 150–400)
RBC: 3.89 MIL/uL (ref 3.87–5.11)
RDW: 15.6 % — ABNORMAL HIGH (ref 11.5–15.5)
WBC: 7.5 10*3/uL (ref 4.0–10.5)
nRBC: 0 % (ref 0.0–0.2)

## 2022-05-13 LAB — GRAM STAIN

## 2022-05-13 MED ORDER — MIDAZOLAM HCL 2 MG/2ML IJ SOLN
INTRAMUSCULAR | Status: AC | PRN
  Administered 2022-05-13: 2 mg via INTRAVENOUS

## 2022-05-13 MED ORDER — ATORVASTATIN CALCIUM 40 MG PO TABS
40.0000 mg | ORAL_TABLET | Freq: Every day | ORAL | Status: DC
  Administered 2022-05-13: 40 mg via ORAL
  Filled 2022-05-13: qty 1

## 2022-05-13 MED ORDER — DOXYCYCLINE HYCLATE 100 MG PO TABS
100.0000 mg | ORAL_TABLET | Freq: Two times a day (BID) | ORAL | Status: DC
  Administered 2022-05-13: 100 mg via ORAL
  Filled 2022-05-13 (×2): qty 1

## 2022-05-13 MED ORDER — SODIUM CHLORIDE 0.9 % IV SOLN
2.0000 g | INTRAVENOUS | Status: DC
  Administered 2022-05-13: 2 g via INTRAVENOUS
  Filled 2022-05-13: qty 20

## 2022-05-13 MED ORDER — ASPIRIN 81 MG PO TBEC
81.0000 mg | DELAYED_RELEASE_TABLET | Freq: Every day | ORAL | Status: DC
  Administered 2022-05-13: 81 mg via ORAL
  Filled 2022-05-13: qty 1

## 2022-05-13 MED ORDER — ATORVASTATIN CALCIUM 40 MG PO TABS: 40.0000 mg | ORAL_TABLET | Freq: Every day | ORAL | 0 refills | Status: DC

## 2022-05-13 MED ORDER — FENTANYL CITRATE (PF) 100 MCG/2ML IJ SOLN
INTRAMUSCULAR | Status: AC | PRN
  Administered 2022-05-13 (×2): 50 ug via INTRAVENOUS

## 2022-05-13 MED ORDER — SODIUM CHLORIDE 0.9 % IV SOLN
INTRAVENOUS | Status: AC
  Administered 2022-05-13: 250 mL
  Filled 2022-05-13: qty 500

## 2022-05-13 MED ORDER — CLOPIDOGREL BISULFATE 75 MG PO TABS
75.0000 mg | ORAL_TABLET | Freq: Every day | ORAL | Status: DC
  Administered 2022-05-13: 75 mg via ORAL
  Filled 2022-05-13: qty 1

## 2022-05-13 MED ORDER — DOXYCYCLINE HYCLATE 100 MG PO TABS: 100.0000 mg | ORAL_TABLET | Freq: Two times a day (BID) | ORAL | 0 refills | Status: AC

## 2022-05-13 MED ORDER — METRONIDAZOLE 500 MG PO TABS: 500.0000 mg | ORAL_TABLET | Freq: Two times a day (BID) | ORAL | 0 refills | Status: AC

## 2022-05-13 MED ORDER — HYDROXYZINE HCL 10 MG PO TABS: 10.0000 mg | ORAL_TABLET | Freq: Once | ORAL | Status: DC

## 2022-05-13 MED ORDER — FENTANYL CITRATE (PF) 100 MCG/2ML IJ SOLN
INTRAMUSCULAR | Status: AC
  Filled 2022-05-13: qty 2

## 2022-05-13 MED ORDER — CLOPIDOGREL BISULFATE 75 MG PO TABS: 75.0000 mg | ORAL_TABLET | Freq: Every day | ORAL | 0 refills | Status: DC

## 2022-05-13 MED ORDER — LIDOCAINE HCL (PF) 1 % IJ SOLN
INTRAMUSCULAR | Status: AC | PRN
  Administered 2022-05-13: 15 mL

## 2022-05-13 MED ORDER — ASPIRIN 81 MG PO TBEC: 81.0000 mg | DELAYED_RELEASE_TABLET | Freq: Every day | ORAL | 1 refills | Status: DC

## 2022-05-13 MED ORDER — PANTOPRAZOLE SODIUM 40 MG PO TBEC
40.0000 mg | DELAYED_RELEASE_TABLET | Freq: Every day | ORAL | Status: DC
  Administered 2022-05-13: 40 mg via ORAL
  Filled 2022-05-13: qty 1

## 2022-05-13 MED ORDER — CIPROFLOXACIN HCL 500 MG PO TABS: 500.0000 mg | ORAL_TABLET | Freq: Two times a day (BID) | ORAL | 0 refills | Status: AC

## 2022-05-13 MED ORDER — HYDROXYZINE HCL 25 MG PO TABS
25.0000 mg | ORAL_TABLET | Freq: Once | ORAL | Status: AC
  Administered 2022-05-13: 25 mg via ORAL
  Filled 2022-05-13: qty 1

## 2022-05-13 MED ORDER — MIDAZOLAM HCL 2 MG/2ML IJ SOLN
INTRAMUSCULAR | Status: AC
  Filled 2022-05-13: qty 4

## 2022-05-13 NOTE — Progress Notes (Signed)
Patient returned to unit post procedure. Pt alert and call bell placed within reach.

## 2022-05-13 NOTE — Progress Notes (Addendum)
ID Brief Note    Attempted to see patient this afternoon, however patient had just left the room to IR for planned IR procedure for drainage of intraabdominal fluid collection.  Chart briefly reviewed Afebrile, no leukocytosis, hemodynamically stable  Patient was seen on 9/18 prior to leaving AMA  On Doxycycline, metronidazole and ciprofloxacin   She was given ceftriaxone in 2019 as well as had reported tolerating 2 courses of cephalexin in the past without any issues. Confirmed with ID pharm D  Will switch ciprofloxacin to IV ceftriaxone given above h/o  Please send sample from IR procedure for cultures as appropriate  Will see her tomorrow  Rosiland Oz, MD Infectious Disease Physician [facility] [address] [place], [place] [address] Phone: [phone]  Fax: [phone]

## 2022-05-13 NOTE — Progress Notes (Signed)
SDOH Interventions Today    Flowsheet Row Most Recent Value  SDOH Interventions   Food Insecurity Interventions Intervention Not Indicated  Utilities Interventions Other (Comment) (P)   [Provided community resources]

## 2022-05-13 NOTE — Hospital Course (Addendum)
36 y.o.f w/ history significant of CVA, cirrhosis of the liver, HTN, cocaine abuse, EtOH abuse presented with  N/V,abdominal pain who was supposed to have pelvic abscesses drained 9/18, but she decided to leave AMA ( admit 9/8- AMA 9/18) and returned with the same pain.  She had multiple Cts- last one 05/10/22 "multiple rim-enhancing fluid collections with inflammatory changes in the bilateral adnexa appears similar but slightly enlarged, 2 new fluid collections appeared.  Findings favored to reflect PID".  She had heaviness on the right side unclear onset MRI showed multiple small infarcts right pons left predominant right thalamus, corpus callosum and left frontal white matter. In the ED WBC count 7.4, BMP with stable potassium, bicarbonate and renal function and LFTs. Patient placed IV antibiotic and IR reconsulted.

## 2022-05-13 NOTE — Consult Note (Addendum)
Chief Complaint: Intra abdominal abscesses. Request is for intra abdominal abscess drain placement possible aspiration.   Referring Physician(s): Dr. Wendy Poet  Supervising Physician: Oley Balm  Patient Status: [facility] - In-pt  History of Present Illness: [name] is a 36 y.o. female inpatient. Smoker. History of anxiety, cirrhosis, substance abuse, unspecified renal disorder, CVA. . Presented to the ED at [facility] on 9.8.23  with lower abdominal pain, nausea and diarrhea.  CT abd pelvis from 9.17.23 reads 1. Multiple rim enhancing fluid collections with associated inflammatory changes in the bilateral adnexa appear similar to prior exam and two fluid collections without significant rim enhancement are new since 05/04/2022. These findings are favored to reflect pelvic inflammatory disease as opposed to enteritis as the primary etiology. Team is requesting aspiration- drain placement of intra abdominal abscesses. Case was reviewed by Dr. Archer Asa, patient has borderline safe window for aspiration and possible drain placement. The case was approved for CT guided aspiration and possible drain placement; however patient left AMA.   Patient is re-admitted since 9/18, IR was again requested for drain placement.   Patient alert and laying in bed calm. States that she is starving, asking for ice chips. RN notified.  Reports mild abd pain and N/V.  Past Medical History:  Diagnosis Date   Anxiety    Cirrhosis of liver (HCC)    Cocaine use 08/04/2018   History of cocaine use 08/04/2018   Hypertension    Polysubstance abuse (HCC)    Renal disorder    Kidney Infection     Past Surgical History:  Procedure Laterality Date   ARTERY REPAIR     CESAREAN SECTION     x 5   MOUTH SURGERY     TEAR DUCT PROBING     TUBAL LIGATION     VENTRAL HERNIA REPAIR N/A 10/03/2016   Procedure: OPEN INCARCERATED HERNIA REPAIR VENTRAL ADULT;  Surgeon: Axel Filler, MD;  Location: [facility] OR;   Service: General;  Laterality: N/A;    Allergies: Ibuprofen, Latex, Peanut-containing drug products, Penicillins, Acetaminophen, Codeine, Lisinopril, and Ativan [lorazepam]  Medications: Prior to Admission medications   Medication Sig Start Date End Date Taking? Authorizing Provider  acetaminophen (TYLENOL) 325 MG tablet Take 325-650 mg by mouth every 6 (six) hours as needed for mild pain or headache.   Yes [provider]  cloNIDine (CATAPRES) 0.1 MG tablet Take 1 tablet (0.1 mg total) by mouth 2 (two) times daily. 05/01/17  Yes Upstill, Melvenia Beam, PA-C  ibuprofen (ADVIL) 200 MG tablet Take 200-400 mg by mouth every 6 (six) hours as needed for headache or mild pain.   Yes [provider]  chlordiazePOXIDE (LIBRIUM) 25 MG capsule Take 1 capsule (25 mg total) by mouth 3 (three) times daily as needed for withdrawal. Patient not taking: Reported on 05/01/2022 08/04/18   Felicie Morn, NP  omeprazole (PRILOSEC) 40 MG capsule Take 1 capsule (40 mg total) by mouth daily. Patient not taking: Reported on 05/01/2022 09/03/17   Ward, Layla Maw, DO  propranolol (INDERAL) 40 MG tablet Take 1 tablet (40 mg total) by mouth 2 (two) times daily. Patient not taking: Reported on 05/01/2022 05/01/17   Elpidio Anis, PA-C     Family History  Problem Relation Age of Onset   CAD Other    Diabetes Other    Hypertension Other    Cancer Other    Thyroid disease Other     Social History   Socioeconomic History   Marital status: Single  Spouse name: Not on file   Number of children: Not on file   Years of education: Not on file   Highest education level: Not on file  Occupational History   Not on file  Tobacco Use   Smoking status: Every Day    Packs/day: 0.50    Types: Cigarettes   Smokeless tobacco: Never  Vaping Use   Vaping Use: Never used  Substance and Sexual Activity   Alcohol use: Yes    Comment: daily   Drug use: Not Currently    Types: Cocaine   Sexual activity: Not on file   Other Topics Concern   Not on file  Social History Narrative   Not on file   Social Determinants of Health   Financial Resource Strain: Not on file  Food Insecurity: Food Insecurity Present (05/11/2022)   Hunger Vital Sign    Worried About Running Out of Food in the Last Year: Sometimes true    Ran Out of Food in the Last Year: Sometimes true  Transportation Needs: Not on file  Physical Activity: Not on file  Stress: Not on file  Social Connections: Not on file    Review of Systems: A 12 point ROS discussed and pertinent positives are indicated in the HPI above.  All other systems are negative.  Review of Systems  Constitutional:  Negative for fatigue and fever.  HENT:  Negative for congestion.   Respiratory:  Negative for cough and shortness of breath.   Gastrointestinal:  Positive for abdominal pain (RLQ) and nausea. Negative for diarrhea and vomiting.    Vital Signs: BP 120/79 (BP Location: Left Arm)   Pulse 66   Temp 98.2 F (36.8 C) (Oral)   Resp 16   Ht 5' (1.524 m)   Wt 158 lb 11.7 oz (72 kg)   LMP 05/01/2022 (Approximate) Comment: negative hcg blood test 05-01-2022  SpO2 96%   BMI 31.00 kg/m     Physical Exam Vitals and nursing note reviewed.  Constitutional:      Appearance: She is well-developed.  HENT:     Head: Normocephalic and atraumatic.  Eyes:     Conjunctiva/sclera: Conjunctivae normal.  Cardiovascular:     Rate and Rhythm: Normal rate and regular rhythm.     Heart sounds: Normal heart sounds.  Pulmonary:     Effort: Pulmonary effort is normal.     Breath sounds: Normal breath sounds.  Abdominal:     General: Abdomen is flat. Bowel sounds are normal.     Palpations: Abdomen is soft.  Musculoskeletal:     Cervical back: Normal range of motion.  Skin:    General: Skin is warm and dry.  Neurological:     Mental Status: She is alert and oriented to person, place, and time.  Psychiatric:        Mood and Affect: Mood normal.         Behavior: Behavior normal.     Imaging: CT ABDOMEN PELVIS W CONTRAST  Result Date: 05/10/2022 CLINICAL DATA:  Lower abdominal pain, nausea and vomiting. EXAM: CT ABDOMEN AND PELVIS WITH CONTRAST TECHNIQUE: Multidetector CT imaging of the abdomen and pelvis was performed using the standard protocol following bolus administration of intravenous contrast. RADIATION DOSE REDUCTION: This exam was performed according to the departmental dose-optimization program which includes automated exposure control, adjustment of the mA and/or kV according to patient size and/or use of iterative reconstruction technique. CONTRAST:  162mL OMNIPAQUE IOHEXOL 300 MG/ML  SOLN COMPARISON:  CT  abdomen pelvis dated 05/04/2022. FINDINGS: Lower chest: No acute abnormality. Hepatobiliary: There is an 8 mm cyst in the liver. No imaging follow-up is recommended for this finding. No gallstones, gallbladder wall thickening, or biliary dilatation. Pancreas: Unremarkable. No pancreatic ductal dilatation or surrounding inflammatory changes. Spleen: Normal in size without focal abnormality. Adrenals/Urinary Tract: Adrenal glands are unremarkable. Kidneys are normal, without renal calculi, focal lesion, or hydronephrosis. Bladder is unremarkable. Stomach/Bowel: Stomach is within normal limits. Enteric contrast reaches the distal small bowel. The appendix is not definitely seen. No evidence of bowel obstruction. Vascular/Lymphatic: A few periaortic lymph nodes are at the upper limits of normal, measuring 10 mm in short axis (series 2, image 41 and image 49). A mesenteric lymph node is at the upper limits of normal, measuring 10 mm in short axis (series 2, image 53). No significant vascular findings are present. Reproductive: Multiple fluid collections with rim enhancement and associated inflammatory changes in the bilateral adnexa appear similar to prior exam. These inflammatory changes are in close proximity with a loop of small bowel and the  sigmoid colon. A fluid collection in the left adnexa without associated rim enhancement has increased in size (4.7 x 2.8 x 3.4 cm on series 2, image 61 and series 4, image 38). A fluid collection near the sigmoid colon without rim enhancement is new since prior exam (2.4 x 3.3 x 2.4 cm on series 2, image 63 and series 4, image 31). Other: No abdominal wall hernia or abnormality. Musculoskeletal: No acute or significant osseous findings. IMPRESSION: 1. Multiple rim enhancing fluid collections with associated inflammatory changes in the bilateral adnexa appear similar to prior exam and two fluid collections without significant rim enhancement are new since 05/04/2022. These findings are favored to reflect pelvic inflammatory disease as opposed to enteritis as the primary etiology. 2. Inflammatory changes involving a loop of small bowel and the sigmoid colon as well as multiple abdominal lymph nodes at the upper limits of normal are favored to be reactive. Electronically Signed   By: Romona Curls M.D.   On: 05/10/2022 16:06   ECHOCARDIOGRAM LIMITED BUBBLE STUDY  Result Date: 05/07/2022    ECHOCARDIOGRAM LIMITED REPORT   Patient Name:   [name] Date of Exam: 05/07/2022 Medical Rec #:  [id number]          Height:       60.0 in Accession #:    [id number]         Weight:       158.7 lb Date of Birth:  16-Jan-1986           BSA:          1.692 m Patient Age:    36 years           BP:           135/84 mmHg Patient Gender: F                  HR:           76 bpm. Exam Location:  Inpatient Procedure: Limited Echo and Saline Contrast Bubble Study Indications:    stroke  History:        Patient has prior history of Echocardiogram examinations, most                 recent 05/06/2022. Stroke.  Sonographer:    Cathie Hoops Referring Phys: [id number] Malachi Carl STACK IMPRESSIONS  1. Limited bubble study for stroke  2. Left ventricular  ejection fraction, by estimation, is 60 to 65%. The left ventricle has normal function.  3.  Agitated saline contrast bubble study was negative, with no evidence of any interatrial shunt. Comparison(s): 05/06/2022: LVEF 60-65%. FINDINGS  Left Ventricle: Left ventricular ejection fraction, by estimation, is 60 to 65%. The left ventricle has normal function. IAS/Shunts: No atrial level shunt detected by color flow Doppler. Agitated saline contrast was given intravenously to evaluate for intracardiac shunting. Agitated saline contrast bubble study was negative, with no evidence of any interatrial shunt. [name] MD Electronically signed by [name] MD Signature Date/Time: 05/07/2022/2:51:35 PM    Final    DG Pelvis 1-2 Views  Result Date: 05/07/2022 CLINICAL DATA:  Trauma, fall EXAM: PELVIS - 1-2 VIEW COMPARISON:  None Available. FINDINGS: No fracture or dislocation is seen.  SI joints are symmetrical. IMPRESSION: No fracture or dislocation is seen. Electronically Signed   By: Ernie Avena M.D.   On: 05/07/2022 09:36   CT ANGIO HEAD W OR WO CONTRAST  Result Date: 05/06/2022 CLINICAL DATA:  Stroke follow-up. EXAM: CT ANGIOGRAPHY HEAD AND NECK TECHNIQUE: Multidetector CT imaging of the head and neck was performed using the standard protocol during bolus administration of intravenous contrast. Multiplanar CT image reconstructions and MIPs were obtained to evaluate the vascular anatomy. Carotid stenosis measurements (when applicable) are obtained utilizing NASCET criteria, using the distal internal carotid diameter as the denominator. RADIATION DOSE REDUCTION: This exam was performed according to the departmental dose-optimization program which includes automated exposure control, adjustment of the mA and/or kV according to patient size and/or use of iterative reconstruction technique. CONTRAST:  75mL OMNIPAQUE IOHEXOL 350 MG/ML SOLN COMPARISON:  Head and neck CTA 03/21/2020 FINDINGS: CTA NECK FINDINGS Aortic arch: Standard 3 vessel aortic arch. Widely patent arch vessel origins. Right  carotid system: Patent and smooth without evidence of stenosis or dissection. Tortuous proximal ICA. Left carotid system: Patent and smooth without evidence of stenosis or dissection. Tortuous mid cervical ICA. Vertebral arteries: Patent, smooth, and codominant without evidence of stenosis or dissection. Skeleton: Widespread dental caries and periapical lucencies. Other neck: No evidence of cervical lymphadenopathy or mass. Upper chest: Clear lung apices. Review of the MIP images confirms the above findings CTA HEAD FINDINGS Anterior circulation: The internal carotid arteries are widely patent from skull base to carotid termini. ACAs and MCAs are patent without evidence of a proximal branch occlusion or significant proximal stenosis. There is a new severe distal left A2 stenosis. No aneurysm is identified. Posterior circulation: The intracranial vertebral arteries are widely patent to the basilar. Patent PICA, AICA, and SCA origins are seen bilaterally. The basilar artery is widely patent. There is a large left posterior communicating artery with absent left P1 segment. Both PCAs are patent without evidence of a significant proximal stenosis. No aneurysm is identified. Venous sinuses: Patent. Anatomic variants: Fetal left PCA. Review of the MIP images confirms the above findings IMPRESSION: 1. No large vessel occlusion or significant proximal stenosis in the head or neck. 2. New severe distal left A2 stenosis. Electronically Signed   By: Sebastian Ache M.D.   On: 05/06/2022 18:17   CT ANGIO NECK W OR WO CONTRAST  Result Date: 05/06/2022 CLINICAL DATA:  Stroke follow-up. EXAM: CT ANGIOGRAPHY HEAD AND NECK TECHNIQUE: Multidetector CT imaging of the head and neck was performed using the standard protocol during bolus administration of intravenous contrast. Multiplanar CT image reconstructions and MIPs were obtained to evaluate the vascular anatomy. Carotid stenosis measurements (when applicable) are  obtained  utilizing NASCET criteria, using the distal internal carotid diameter as the denominator. RADIATION DOSE REDUCTION: This exam was performed according to the departmental dose-optimization program which includes automated exposure control, adjustment of the mA and/or kV according to patient size and/or use of iterative reconstruction technique. CONTRAST:  73mL OMNIPAQUE IOHEXOL 350 MG/ML SOLN COMPARISON:  Head and neck CTA 03/21/2020 FINDINGS: CTA NECK FINDINGS Aortic arch: Standard 3 vessel aortic arch. Widely patent arch vessel origins. Right carotid system: Patent and smooth without evidence of stenosis or dissection. Tortuous proximal ICA. Left carotid system: Patent and smooth without evidence of stenosis or dissection. Tortuous mid cervical ICA. Vertebral arteries: Patent, smooth, and codominant without evidence of stenosis or dissection. Skeleton: Widespread dental caries and periapical lucencies. Other neck: No evidence of cervical lymphadenopathy or mass. Upper chest: Clear lung apices. Review of the MIP images confirms the above findings CTA HEAD FINDINGS Anterior circulation: The internal carotid arteries are widely patent from skull base to carotid termini. ACAs and MCAs are patent without evidence of a proximal branch occlusion or significant proximal stenosis. There is a new severe distal left A2 stenosis. No aneurysm is identified. Posterior circulation: The intracranial vertebral arteries are widely patent to the basilar. Patent PICA, AICA, and SCA origins are seen bilaterally. The basilar artery is widely patent. There is a large left posterior communicating artery with absent left P1 segment. Both PCAs are patent without evidence of a significant proximal stenosis. No aneurysm is identified. Venous sinuses: Patent. Anatomic variants: Fetal left PCA. Review of the MIP images confirms the above findings IMPRESSION: 1. No large vessel occlusion or significant proximal stenosis in the head or neck. 2.  New severe distal left A2 stenosis. Electronically Signed   By: Sebastian Ache M.D.   On: 05/06/2022 18:17   ECHOCARDIOGRAM COMPLETE  Result Date: 05/06/2022    ECHOCARDIOGRAM REPORT   Patient Name:   [name] Date of Exam: 05/06/2022 Medical Rec #:  [id number]          Height:       60.0 in Accession #:    [id number]         Weight:       158.7 lb Date of Birth:  1985/09/30           BSA:          1.692 m Patient Age:    36 years           BP:           117/77 mmHg Patient Gender: F                  HR:           80 bpm. Exam Location:  Inpatient Procedure: 2D Echo, Cardiac Doppler and Color Doppler Indications:    Stroke  History:        Patient has prior history of Echocardiogram examinations, most                 recent 03/21/2020. Risk Factors:Hypertension.  Sonographer:    Eduard Roux Referring Phys: [id number] Daylene Katayama GHERGHE IMPRESSIONS  1. Left ventricular ejection fraction, by estimation, is 60 to 65%. The left ventricle has normal function. The left ventricle has no regional wall motion abnormalities. There is mild concentric left ventricular hypertrophy. Left ventricular diastolic parameters are consistent with Grade II diastolic dysfunction (pseudonormalization).  2. Right ventricular systolic function is normal. The right ventricular size is normal. There is  normal pulmonary artery systolic pressure. The estimated right ventricular systolic pressure is 16.2 mmHg.  3. Left atrial size was mildly dilated.  4. The mitral valve is normal in structure. No evidence of mitral valve regurgitation. No evidence of mitral stenosis.  5. The aortic valve is tricuspid. Aortic valve regurgitation is not visualized. No aortic stenosis is present.  6. The inferior vena cava is normal in size with greater than 50% respiratory variability, suggesting right atrial pressure of 3 mmHg. FINDINGS  Left Ventricle: Left ventricular ejection fraction, by estimation, is 60 to 65%. The left ventricle has normal function.  The left ventricle has no regional wall motion abnormalities. The left ventricular internal cavity size was normal in size. There is  mild concentric left ventricular hypertrophy. Left ventricular diastolic parameters are consistent with Grade II diastolic dysfunction (pseudonormalization). Right Ventricle: The right ventricular size is normal. No increase in right ventricular wall thickness. Right ventricular systolic function is normal. There is normal pulmonary artery systolic pressure. The tricuspid regurgitant velocity is 1.82 m/s, and  with an assumed right atrial pressure of 3 mmHg, the estimated right ventricular systolic pressure is 16.2 mmHg. Left Atrium: Left atrial size was mildly dilated. Right Atrium: Right atrial size was normal in size. Pericardium: Trivial pericardial effusion is present. Mitral Valve: The mitral valve is normal in structure. No evidence of mitral valve regurgitation. No evidence of mitral valve stenosis. Tricuspid Valve: The tricuspid valve is normal in structure. Tricuspid valve regurgitation is trivial. Aortic Valve: The aortic valve is tricuspid. Aortic valve regurgitation is not visualized. No aortic stenosis is present. Aortic valve peak gradient measures 10.4 mmHg. Pulmonic Valve: The pulmonic valve was normal in structure. Pulmonic valve regurgitation is trivial. Aorta: The aortic root is normal in size and structure. Venous: The inferior vena cava is normal in size with greater than 50% respiratory variability, suggesting right atrial pressure of 3 mmHg. IAS/Shunts: No atrial level shunt detected by color flow Doppler.  LEFT VENTRICLE PLAX 2D LVIDd:         4.30 cm   Diastology LVIDs:         2.90 cm   LV e' medial:    5.63 cm/s LV PW:         1.50 cm   LV E/e' medial:  17.8 LV IVS:        1.50 cm   LV e' lateral:   7.51 cm/s LVOT diam:     1.90 cm   LV E/e' lateral: 13.3 LV SV:         75 LV SV Index:   45 LVOT Area:     2.84 cm  RIGHT VENTRICLE             IVC RV Basal  diam:  2.90 cm     IVC diam: 1.50 cm RV S prime:     13.00 cm/s TAPSE (M-mode): 2.5 cm LEFT ATRIUM             Index        RIGHT ATRIUM           Index LA diam:        3.10 cm 1.83 cm/m   RA Area:     15.40 cm LA Vol (A2C):   53.7 ml 31.74 ml/m  RA Volume:   38.90 ml  22.99 ml/m LA Vol (A4C):   62.7 ml 37.06 ml/m LA Biplane Vol: 59.3 ml 35.05 ml/m  AORTIC VALVE  PULMONIC VALVE AV Area (Vmax): 2.40 cm     PV Vmax:       0.75 m/s AV Vmax:        161.00 cm/s  PV Peak grad:  2.2 mmHg AV Peak Grad:   10.4 mmHg LVOT Vmax:      136.00 cm/s LVOT Vmean:     99.100 cm/s LVOT VTI:       0.266 m  AORTA Ao Root diam: 3.40 cm Ao Asc diam:  3.30 cm MITRAL VALVE                TRICUSPID VALVE MV Area (PHT): 3.06 cm     TR Peak grad:   13.2 mmHg MV Decel Time: 248 msec     TR Vmax:        182.00 cm/s MV E velocity: 100.00 cm/s MV A velocity: 87.50 cm/s   SHUNTS MV E/A ratio:  1.14         Systemic VTI:  0.27 m                             Systemic Diam: 1.90 cm Dalton McleanMD Electronically signed by Wilfred Lacy Signature Date/Time: 05/06/2022/5:45:29 PM    Final    MR BRAIN WO CONTRAST  Result Date: 05/06/2022 CLINICAL DATA:  Acute neuro deficit. EXAM: MRI HEAD WITHOUT CONTRAST TECHNIQUE: Multiplanar, multiecho pulse sequences of the brain and surrounding structures were obtained without intravenous contrast. COMPARISON:  CT head 05/06/2022.  MRI head 03/20/2020 FINDINGS: Brain: Multiple areas of restricted diffusion compatible with acute infarct. These are all small areas of infarct involving the right pons, left putamen, right thalamus, corpus callosum, and the left frontal white matter. Chronic infarcts in the pons especially on the right. Chronic lacunar infarcts in the thalamus bilaterally. Chronic infarcts in the basal ganglia bilaterally. Multiple foci of chronic hemorrhage involving the pons. Chronic microhemorrhage in the right thalamus and right putamen. No hydrocephalus or mass lesion.  Vascular: Normal arterial flow voids Skull and upper cervical spine: Negative Sinuses/Orbits: Mild mucosal edema paranasal sinuses. Negative orbit Other: None IMPRESSION: Numerous small areas of acute infarct as described above. Correlate with recent history of hypertension or hypotension. Cerebral emboli possible however no cortical infarct is seen as would be expected with emboli. Advanced for age chronic microvascular ischemia and multiple areas of chronic microhemorrhage. Correlate with risk factors for small vessel disease. Electronically Signed   By: Marlan Palau M.D.   On: 05/06/2022 14:51   CT HEAD WO CONTRAST ( )  Result Date: 05/06/2022 CLINICAL DATA:  Patient is unable to move the right foot this morning. 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 RADIATION DOSE REDUCTION: This exam was performed according to the departmental dose-optimization program which includes automated exposure control, adjustment of the mA and/or kV according to patient size and/or use of iterative reconstruction technique. COMPARISON:  CT brain 03/20/2020, MRI head 03/20/2020 FINDINGS: Brain: Chronic left thalamic and left lentiform nucleus lacunar infarcts. There is also a chronic infarct in the right thalamus. Compared to prior exams there is an age indeterminate hypodensity in the left caudate head (series 2, image 15). No evidence of hydrocephalus. No hemorrhage. No extra-axial fluid collections. Vascular: No hyperdense vessel or unexpected calcification. Skull: Normal. Negative for fracture or focal lesion. Sinuses/Orbits: No acute finding.  Other: None. IMPRESSION: Compared to prior exam there is a new hypodensity in the left caudate head. Although this has a chronic appearance, it was not visualized on prior imaging and is technically age indeterminate. If there is  clinical concern for an acute infarct, this could be further assessed with a brain MRI. Electronically Signed   By: Lorenza Cambridge M.D.   On: 05/06/2022 12:01   CT Angio Abd/Pel w/ and/or w/o  Result Date: 05/04/2022 CLINICAL DATA:  Mid and lower abdominal pain. Evaluate for acute mesenteric ischemia. EXAM: CTA ABDOMEN AND PELVIS WITHOUT AND WITH CONTRAST TECHNIQUE: Multidetector CT imaging of the abdomen and pelvis was performed using the standard protocol during bolus administration of intravenous contrast. Multiplanar reconstructed images and MIPs were obtained and reviewed to evaluate the vascular anatomy. RADIATION DOSE REDUCTION: This exam was performed according to the departmental dose-optimization program which includes automated exposure control, adjustment of the mA and/or kV according to patient size and/or use of iterative reconstruction technique. CONTRAST:  OMNIPAQUE IOHEXOL 350 MG/ML SOLN COMPARISON:  CT abdomen and pelvis  05/01/2022 and 08/04/2018 FINDINGS: VASCULAR Aorta: Normal caliber aorta without aneurysm, dissection, vasculitis or significant stenosis. Celiac: Patent without evidence of aneurysm, dissection, vasculitis or significant stenosis. Variant anatomy with only 2 main branches coming off the celiac trunk. The 2 main branches are the left gastric artery and the splenic artery. Common hepatic artery is coming off the left gastric artery in the expected location of an accessory left gastric artery. There is a small gastroduodenal artery. SMA: Patent without evidence of aneurysm, dissection, vasculitis or significant stenosis. Renals: Both renal arteries are patent without evidence of aneurysm, dissection, vasculitis, fibromuscular dysplasia or significant stenosis. IMA: Patent without evidence of aneurysm, dissection, vasculitis or significant stenosis. Inflow: Patent without evidence of aneurysm, dissection, vasculitis or significant stenosis. Proximal Outflow: Bilateral common  femoral and visualized portions of the superficial and profunda femoral arteries are patent without evidence of aneurysm, dissection, vasculitis or significant stenosis. Veins: Portal veins and main mesenteric veins are patent without filling defects. IVC and renal veins are patent. No gross abnormality to the iliac veins. Review of the MIP images confirms the above findings. NON-VASCULAR Lower chest: Mild dependent changes in the posterior right lower lobe. Otherwise, the lung bases are clear. Hepatobiliary: 8 mm hypodensity in the anterior liver is probably an incidental finding. Gallbladder is decompressed. No biliary dilatation. Pancreas: Unremarkable. No pancreatic ductal dilatation or surrounding inflammatory changes. Spleen: Normal in size without focal abnormality. Adrenals/Urinary Tract: Normal adrenal glands. Normal appearance of both kidneys hydronephrosis. 6 mm hypodensity in the right kidney lower pole on sequence 4, image 114 is too small to definitively characterize but likely represents a cyst. This structure does not require dedicated follow-up. No suspicious renal lesions. Mild wall thickening in the urinary bladder. Stomach/Bowel: Normal appearance of the stomach. Question a small hiatal hernia. New fluid and inflammatory changes in the lower abdomen and in the pelvis. Inflammatory changes are centered around uterus and adnexal structures and near loops of small bowel in the lower abdomen. Mildly dilated loops of small bowel in the lower abdomen, largest measuring up to 3.3 cm. Inflammatory changes are also the near the sigmoid colon. Appendix is not confidently identified. Lymphatic: Mildly prominent retroperitoneal lymph nodes. Index lymph node between the aorta and IVC on sequence 11 image 100 measures 1.2 cm in the short axis and this lymph node measured approximately 0.7 cm on 08/04/2018. No significant lymph node enlargement in the pelvis.  Reproductive: Fluid and stranding throughout the  pelvis around the uterus and adnexal structures. No discrete adnexal lesion. Other: There is extensive stranding with a small amount of fluid throughout the lower abdomen and pelvis. Evidence for developing fluid collections in the pelvis. A collection along the left side of the pelvis on sequence 11 image 194 measures 4.8 x 2.7 cm. Evidence for small complex fluid collections in the pre rectal region on image 194. There is additional complex fluid between the uterus and right adnexal structures on image 172. Musculoskeletal: No acute bone abnormality. IMPRESSION: VASCULAR 1. Arterial structures are widely patent without significant atherosclerotic disease. No evidence for arterial stenosis. 2. Variant celiac artery anatomy as described. NON-VASCULAR 1. 1. New fluid and inflammatory changes in the lower abdomen and pelvis. These inflammatory changes are near the uterus, adnexa and loops of bowel. Inflammation source is unclear. Differential diagnosis includes enteritis, colitis and possibly pelvic inflammatory disease. Dilatation of some small bowel loops suggests that small bowel could be the inflammatory source. 2. New small complex fluid collections in the pelvis. Early or developing small abscess collections cannot be excluded. Recommend short-term follow-up CT to evaluate these fluid collections. 3. Slightly enlarged retroperitoneal lymph nodes as described. Findings are nonspecific but could be reactive. Recommend attention to these on follow-up imaging. 4. Mild wall thickening in the urinary bladder is nonspecific due to incomplete distension and could be also be related to surrounding inflammatory changes. Electronically Signed   By: Richarda Overlie M.D.   On: 05/04/2022 14:19   US Pelvis Complete  Result Date: 05/01/2022 CLINICAL DATA:  Pelvic pain EXAM: TRANSABDOMINAL AND TRANSVAGINAL ULTRASOUND OF PELVIS DOPPLER ULTRASOUND OF OVARIES TECHNIQUE: Both transabdominal and transvaginal ultrasound examinations of  the pelvis were performed. Transabdominal technique was performed for global imaging of the pelvis including uterus, ovaries, adnexal regions, and pelvic cul-de-sac. It was necessary to proceed with endovaginal exam following the transabdominal exam to visualize the endometrium and ovaries. Color and duplex Doppler ultrasound was utilized to evaluate blood flow to the ovaries. COMPARISON:  CT abdomen 05/01/2022 FINDINGS: Uterus Measurements: 9.4 x 5 x 5.8 cm = volume: 140.8 mL. No fibroids or other mass visualized. Endometrium Thickness: 10 mm.  No focal abnormality visualized. Right ovary Measurements: 3.3 x 2.3 x 3.1 cm = volume: 12 mL. Normal appearance/no adnexal mass. Left ovary Measurements: 3 x 2.5 x 3.1 cm = volume: 12 mL. Normal appearance/no adnexal mass. Pulsed Doppler evaluation of both ovaries demonstrates normal low-resistance arterial and venous waveforms. Other findings No abnormal free fluid. IMPRESSION: 1. Normal pelvic ultrasound. 2. No sonographic findings to explain the patient's pelvic pain. Electronically Signed   By: Elige Ko M.D.   On: 05/01/2022 14:30   US Transvaginal Non-OB  Result Date: 05/01/2022 CLINICAL DATA:  Pelvic pain EXAM: TRANSABDOMINAL AND TRANSVAGINAL ULTRASOUND OF PELVIS DOPPLER ULTRASOUND OF OVARIES TECHNIQUE: Both transabdominal and transvaginal ultrasound examinations of the pelvis were performed. Transabdominal technique was performed for global imaging of the pelvis including uterus, ovaries, adnexal regions, and pelvic cul-de-sac. It was necessary to proceed with endovaginal exam following the transabdominal exam to visualize the endometrium and ovaries. Color and duplex Doppler ultrasound was utilized to evaluate blood flow to the ovaries. COMPARISON:  CT abdomen 05/01/2022 FINDINGS: Uterus Measurements: 9.4 x 5 x 5.8 cm = volume: 140.8 mL. No fibroids or other mass visualized. Endometrium Thickness: 10 mm.  No focal abnormality visualized. Right ovary  Measurements: 3.3 x 2.3 x 3.1 cm = volume: 12 mL. Normal  appearance/no adnexal mass. Left ovary Measurements: 3 x 2.5 x 3.1 cm = volume: 12 mL. Normal appearance/no adnexal mass. Pulsed Doppler evaluation of both ovaries demonstrates normal low-resistance arterial and venous waveforms. Other findings No abnormal free fluid. IMPRESSION: 1. Normal pelvic ultrasound. 2. No sonographic findings to explain the patient's pelvic pain. Electronically Signed   By: Elige Ko M.D.   On: 05/01/2022 14:30   [place] Art/Ven Flow Abd Pelv Doppler  Result Date: 05/01/2022 CLINICAL DATA:  Pelvic pain EXAM: TRANSABDOMINAL AND TRANSVAGINAL ULTRASOUND OF PELVIS DOPPLER ULTRASOUND OF OVARIES TECHNIQUE: Both transabdominal and transvaginal ultrasound examinations of the pelvis were performed. Transabdominal technique was performed for global imaging of the pelvis including uterus, ovaries, adnexal regions, and pelvic cul-de-sac. It was necessary to proceed with endovaginal exam following the transabdominal exam to visualize the endometrium and ovaries. Color and duplex Doppler ultrasound was utilized to evaluate blood flow to the ovaries. COMPARISON:  CT abdomen 05/01/2022 FINDINGS: Uterus Measurements: 9.4 x 5 x 5.8 cm = volume: 140.8 mL. No fibroids or other mass visualized. Endometrium Thickness: 10 mm.  No focal abnormality visualized. Right ovary Measurements: 3.3 x 2.3 x 3.1 cm = volume: 12 mL. Normal appearance/no adnexal mass. Left ovary Measurements: 3 x 2.5 x 3.1 cm = volume: 12 mL. Normal appearance/no adnexal mass. Pulsed Doppler evaluation of both ovaries demonstrates normal low-resistance arterial and venous waveforms. Other findings No abnormal free fluid. IMPRESSION: 1. Normal pelvic ultrasound. 2. No sonographic findings to explain the patient's pelvic pain. Electronically Signed   By: Elige Ko M.D.   On: 05/01/2022 14:30   CT Abdomen Pelvis Wo Contrast  Result Date: 05/01/2022 CLINICAL DATA:  Three days of lower  abdominal pain. Positive hematuria. Prior history of tubal ligation. EXAM: CT ABDOMEN AND PELVIS WITHOUT CONTRAST TECHNIQUE: Multidetector CT imaging of the abdomen and pelvis was performed following the standard protocol without IV contrast. RADIATION DOSE REDUCTION: This exam was performed according to the departmental dose-optimization program which includes automated exposure control, adjustment of the mA and/or kV according to patient size and/or use of iterative reconstruction technique. COMPARISON:  August 04, 2018 FINDINGS: Lower chest: No acute abnormality. Hepatobiliary: Diffuse hepatic steatosis. Subtle hypodensity along the anterior aspect of the liver measures 7 mm on image 14/2 and while technically too small to accurately characterize but is stable dating back to August 04, 2018 consistent with a benign finding. Cholelithiasis without findings of acute cholecystitis. No biliary ductal dilation. Pancreas: No pancreatic ductal dilation or evidence of acute inflammation. Spleen: No splenomegaly. Adrenals/Urinary Tract: Mild thickening of the bilateral adrenal glands without discrete nodularity, favor hyperplasia. No hydronephrosis. No renal, ureteral or bladder calculi. Urinary bladder is unremarkable for degree of distension. Stomach/Bowel: Hyperdense material in the colon traversing the rectum likely reflects ingested antidiarrheal agent. Stomach is unremarkable for degree of distension. No pathologic dilation of small or large bowel. The appendix appears normal. Colon is predominately decompressed limiting evaluation. Fibrofatty infiltration of the terminal ileum and colon likely reflect sequela of chronic inflammation. No evidence of acute bowel inflammation. Vascular/Lymphatic: Normal caliber abdominal aorta. Prominent retroperitoneal lymph nodes are similar dating back to August 04, 2018 consistent with a benign/indolent process. Reproductive: Uterus and bilateral adnexa are unremarkable in  noncontrast CT appearance for reproductive age female. Other: Trace pelvic free fluid is within physiologic normal limits. Musculoskeletal: No acute osseous abnormality. IMPRESSION: 1. No hydronephrosis or evidence of obstructive uropathy. 2. Hepatic steatosis. 3. Cholelithiasis without findings of acute cholecystitis. 4. Fibrofatty infiltration of  the terminal ileum and colon as can be seen with chronic inflammation. No evidence of acute bowel inflammation. Electronically Signed   By: Maudry Mayhew M.D.   On: 05/01/2022 12:59    Labs:  CBC: Recent Labs    05/08/22 0452 05/10/22 0700 05/12/22 1323 05/13/22 0536  WBC 5.6 5.3 7.4 7.5  HGB 10.2* 11.0* 13.6 11.7*  HCT 31.8* 35.0* 40.8 37.3  PLT 327 644* 830* 746*     COAGS: Recent Labs    05/12/22 1323  INR 1.1  APTT 31    BMP: Recent Labs    05/08/22 0452 05/10/22 0700 05/12/22 1323 05/13/22 0536  NA 138 137 140 143  K 3.4* 4.0 4.2 4.5  CL 106 104 107 111  CO2 25 27 24 23   GLUCOSE 101* 99 105* 98  BUN 10 10 9 20   CALCIUM 8.7* 8.9 9.6 9.3  CREATININE 0.50 0.64 0.71 0.76  GFRNONAA >60 >60 >60 >60     LIVER FUNCTION TESTS: Recent Labs    05/07/22 0528 05/08/22 0452 05/12/22 1323 05/13/22 0536  BILITOT 0.7 0.4 0.3 0.3  AST 10* 12* 31 26  ALT 11 12 25 22   ALKPHOS 71 76 119 91  PROT 6.2* 6.2* 7.9 6.4*  ALBUMIN 2.7* 2.5* 3.7 3.3*       Assessment and Plan:  36 y.o. female inpatient. Smoker. History of anxiety, cirrhosis, substance abuse, unspecified renal disorder, CVA. Presented to the ED at [facility] on 9.8.23  with lower abdominal pain, nausea and diarrhea.  CT abd pelvis from 9.17.23 reads 1. Multiple rim enhancing fluid collections with associated inflammatory changes in the bilateral adnexa appear similar to prior exam and two fluid collections without significant rim enhancement are new since 05/04/2022. These findings are favored to reflect pelvic inflammatory disease as opposed to enteritis as the primary  etiology. Team is requesting aspiration- drain placement of intra abdominal abscesses.   NPO since MN VSS No leukocytosis, thormbocytosis 746  INR 1.1 on 9/18 Pt has hx of multiple CVA. Team reached out to IR and asked if pt can be on ASA 81mg , checked with both Dr. Deanne Coffer and Dr. Loreta Ave, ok to resume ASA 81 mg.   Risks and benefits discussed with the patient including bleeding, infection, damage to adjacent structures, bowel perforation/fistula connection, and sepsis. Patient was also informed that the procedure might be canceled if pre-procedure CT shows no safe window for percutaneous approach. She verbalized understanding.   All of the patient's questions were answered, patient is agreeable to proceed. Consent signed and in IR.    Thank you for this interesting consult.  I greatly enjoyed meeting [name] and look forward to participating in their care.  A copy of this report was sent to the requesting provider on this date.  Electronically Signed: Willette Brace, PA-C 05/13/2022, 9:45 AM   I spent a total of 40 Minutes    in face to face in clinical consultation, greater than 50% of which was counseling/coordinating care for intra abdominal abscess drain - aspiration

## 2022-05-13 NOTE — Progress Notes (Signed)
[name]  ZJQ:[id number] DOB: 07/27/1986 DOA: 05/12/2022 PCP: Patient, No Pcp Per   Brief Narrative/Hospital Course: 36 y.o.f w/ history significant of CVA, cirrhosis of the liver, HTN, cocaine abuse, EtOH abuse presented with  N/V,abdominal pain who was supposed to have pelvic abscesses drained 9/18, but she decided to leave AMA ( admit 9/8- AMA 9/18) and returned with the same pain.  She had multiple Cts- last one 05/10/22 "multiple rim-enhancing fluid collections with inflammatory changes in the bilateral adnexa appears similar but slightly enlarged, 2 new fluid collections appeared.  Findings favored to reflect PID".  She had heaviness on the right side unclear onset MRI showed multiple small infarcts right pons left predominant right thalamus, corpus callosum and left frontal white matter. In the ED WBC count 7.4, BMP with stable potassium, bicarbonate and renal function and LFTs. Patient placed IV antibiotic and IR reconsulted.       Subjective: Seen and examined this morning resting comfortably on the bedside chair mild abdominal pain Moving all extremities well no visual deficits.  Nursing reports she was complaining of purplish spots this am but transient. ' Assessment and Plan: Principal Problem:   PID (acute pelvic inflammatory disease) Active Problems:   Hypertension   Cocaine abuse (HCC)   Alcohol dependence (HCC)   Tobacco abuse   History of CVA (cerebrovascular accident)   PID with multiple rim-enhancing fluid collections with associated inflammatory changes in the bilateral adnexa: Continue Cipro, Flagyl.  Was seen by OB/GYN previously.  Awaiting IR drainage and culture today continue pain control await ID inputs.  Recent acute multiple punctate WIO:XBDZHG work-up was completed with CT no LVO, new severe distal left A2 stenosis Echo EF 60 to 65%, G2 DD limited bubble study, A1c 5.4 LDL 84 seen by neurology advised dual antiplatelet x90 days followed  by aspirin alone, Plavix not started pending work-up for intra-abdominal infection.  IR okay with aspirin, also start Lipitor 40.  EtOH abuse Cocaine abuse Tobacco abuse Tendency to leave AGAINST MEDICAL ADVICE: Continue thiamine, vitamins cessation advised.  Advised not to leave AGAINST MEDICAL ADVICE before completion of the treatment  HTN: BP well controlled, on clonidine and  Class I Obesity:Patient's Body mass index is 31 kg/m. : Will benefit with PCP follow-up, weight loss  healthy lifestyle and outpatient sleep evaluation.   DVT prophylaxis: SCDs Start: 05/12/22 1659.  Holding chemical prophylaxis for procedure Code Status:   Code Status: Full Code Family Communication: plan of care discussed with patient at bedside. Patient status is: Inpatient because of PID with fluid collection Level of care: Telemetry   Dispo: The patient is from: home            Anticipated disposition: home TBD  Mobility Assessment (last 72 hours)     Mobility Assessment     Row Name 05/12/22 1730           Does patient have an order for bedrest or is patient medically unstable No - Continue assessment       What is the highest level of mobility based on the progressive mobility assessment? Level 5 (Walks with assist in room/hall) - Balance while stepping forward/back and can walk in room with assist - Complete                 Objective: Vitals last 24 hrs: Vitals:   05/12/22 1707 05/12/22 2042 05/13/22 0118 05/13/22 0625  BP: (!) 140/95 114/79 115/70 120/79  Pulse: 95 93 69 66  Resp: 18 17 18 16   Temp: 99.1 F (37.3 C) 98.5 F (36.9 C) 98.1 F (36.7 C) 98.2 F (36.8 C)  TempSrc: Oral   Oral  SpO2: 96% 94% 97% 96%  Weight:      Height:       Weight change:   Physical Examination: General exam: alert awake,older than stated age, weak appearing. HEENT:Oral mucosa moist, Ear/Nose WNL grossly, dentition normal. Respiratory system: bilaterally diminished BS, no use of accessory  muscle Cardiovascular system: S1 & S2 +, No JVD. Gastrointestinal system: Abdomen soft, mildly tender lower abdomen soft , BS+ Nervous System:Alert, awake, moving extremities and grossly nonfocal-sensory intact bilaterally, vision intact no nystagmus Extremities: LE edema NEG,distal peripheral pulses palpable.  Skin: No rashes,no icterus. MSK: Normal muscle bulk,tone, power  Medications reviewed:  Scheduled Meds:  aspirin EC  81 mg Oral Daily   atorvastatin  40 mg Oral Daily   cloNIDine  0.1 mg Oral BID   folic acid  1 mg Oral Daily   multivitamin with minerals  1 tablet Oral Daily   nicotine  14 mg Transdermal Daily   propranolol  40 mg Oral BID   thiamine  100 mg Oral Daily   Or   thiamine  100 mg Intravenous Daily   Continuous Infusions:  ciprofloxacin Stopped (05/13/22 0246)   metronidazole 500 mg (05/13/22 0251)      Diet Order             Diet NPO time specified Except for: Sips with Meds  Diet effective now                            Intake/Output Summary (Last 24 hours) at 05/13/2022 1048 Last data filed at 05/13/2022 0630 Gross per 24 hour  Intake 1300.04 ml  Output --  Net 1300.04 ml   Net IO Since Admission: 1,300.04 mL [05/13/22 1048]  Wt Readings from Last 3 Encounters:  05/12/22 72 kg  05/01/22 72 kg  03/20/20 68.9 kg     Unresulted Labs (From admission, onward)     Start     Ordered   05/12/22 1228  Rapid urine drug screen (hospital performed)  ONCE - STAT,   STAT        05/12/22 1227   05/12/22 1201  Urinalysis, Routine w reflex microscopic  (ED Abdominal Pain)  Once,   URGENT        05/12/22 1200          Data Reviewed: I have personally reviewed following labs and imaging studies CBC: Recent Labs  Lab 05/07/22 0528 05/08/22 0452 05/10/22 0700 05/12/22 1323 05/13/22 0536  WBC 5.5 5.6 5.3 7.4 7.5  NEUTROABS  --   --   --  5.0  --   HGB 11.2* 10.2* 11.0* 13.6 11.7*  HCT 36.5 31.8* 35.0* 40.8 37.3  MCV 97.6 94.6 95.4 92.9  95.9  PLT 283 327 644* 830* 746*   Basic Metabolic Panel: Recent Labs  Lab 05/07/22 0528 05/08/22 0452 05/10/22 0700 05/12/22 1323 05/13/22 0536  NA 135 138 137 140 143  K 3.6 3.4* 4.0 4.2 4.5  CL 105 106 104 107 111  CO2 22 25 27 24 23   GLUCOSE 91 101* 99 105* 98  BUN 12 10 10 9 20   CREATININE 0.60 0.50 0.64 0.71 0.76  CALCIUM 8.7* 8.7* 8.9 9.6 9.3  MG 1.8 1.8  --   --   --   PHOS  4.0  --   --   --   --    GFR: Estimated Creatinine Clearance: 84.4 mL/min (by C-G formula based on SCr of 0.76 mg/dL). Liver Function Tests: Recent Labs  Lab 05/07/22 0528 05/08/22 0452 05/12/22 1323 05/13/22 0536  AST 10* 12* 31 26  ALT 11 12 25 22   ALKPHOS 71 76 119 91  BILITOT 0.7 0.4 0.3 0.3  PROT 6.2* 6.2* 7.9 6.4*  ALBUMIN 2.7* 2.5* 3.7 3.3*   Recent Labs  Lab 05/12/22 1323  LIPASE 37   Recent Labs  Lab 05/12/22 1324  AMMONIA 24   Coagulation Profile: Recent Labs  Lab 05/12/22 1323  INR 1.1   BNP (last 3 results) No results for input(s): "PROBNP" in the last 8760 hours. HbA1C: No results for input(s): "HGBA1C" in the last 72 hours. CBG: No results for input(s): "GLUCAP" in the last 168 hours. Lipid Profile: No results for input(s): "CHOL", "HDL", "LDLCALC", "TRIG", "CHOLHDL", "LDLDIRECT" in the last 72 hours. Thyroid Function Tests: No results for input(s): "TSH", "T4TOTAL", "FREET4", "T3FREE", "THYROIDAB" in the last 72 hours. Sepsis Labs: No results for input(s): "PROCALCITON", "LATICACIDVEN" in the last 168 hours.  No results found for this or any previous visit (from the past 240 hour(s)).  Antimicrobials: Anti-infectives (From admission, onward)    Start     Dose/Rate Route Frequency Ordered Stop   05/13/22 0100  metroNIDAZOLE (FLAGYL) IVPB 500 mg        500 mg 100 mL/hr over 60 Minutes Intravenous Every 12 hours 05/12/22 1658     05/13/22 0100  ciprofloxacin (CIPRO) IVPB 400 mg        400 mg 200 mL/hr over 60 Minutes Intravenous Every 12 hours  05/12/22 1658     05/12/22 1245  ciprofloxacin (CIPRO) IVPB 400 mg        400 mg 200 mL/hr over 60 Minutes Intravenous NOW 05/12/22 1235 05/12/22 1420   05/12/22 1245  metroNIDAZOLE (FLAGYL) IVPB 500 mg        500 mg 100 mL/hr over 60 Minutes Intravenous  Once 05/12/22 1235 05/12/22 1418      Culture/Microbiology    Component Value Date/Time   SDES  05/03/2022 0531    LEFT ANTECUBITAL BLOOD Performed at [facility], [place] [address] [place], [place] [address]    SPECREQUEST  05/03/2022 0531    AEROBIC BOTTLE ONLY Blood Culture adequate volume Performed at [facility] [address], [place], [place] [address]    CULT  05/03/2022 0531    NO GROWTH 5 DAYS Performed at [facility], [place] [address], [place], [place] [address]    REPTSTATUS 05/08/2022 FINAL 05/03/2022 0531   Radiology Studies: No results found.   LOS: 1 day   Antonieta Pert, MD [facility]  05/13/2022, 10:48 AM

## 2022-05-13 NOTE — TOC Initial Note (Signed)
Transition of Care [facility]) - Initial/Assessment Note    Patient Details  Name: [name] MRN: [id number] Date of Birth: 24-Jun-1986  Transition of Care [facility]) CM/SW Contact:    Vassie Moselle, LCSW Phone Number: 05/13/2022, 10:10 AM  Clinical Narrative:                 CSW met with pt to discuss SDOH screenings. Pt denies having any food insecurity but, does admit to struggle to pay utilities. Pt is agreeable to having resources added to her discharge paperwork. Pt has PCP appointment on October 5th at the [facility] that was scheduled on her previous admission. Pt agreeable to using MATCH program at discharge depending on cost of medications. Utility resources added to pt's chart to be given at discharge.   Expected Discharge Plan: Home/Self Care Barriers to Discharge: Continued Medical Work up   Patient Goals and CMS Choice Patient states their goals for this hospitalization and ongoing recovery are:: To go home   Choice offered to / list presented to : Patient  Expected Discharge Plan and Services Expected Discharge Plan: Home/Self Care In-house Referral: NA Discharge Planning Services: NA Post Acute Care Choice: NA Living arrangements for the past 2 months: Mobile Home                 DME Arranged: N/A DME Agency: NA                  Prior Living Arrangements/Services Living arrangements for the past 2 months: Mobile Home Lives with:: Friends Patient language and need for interpreter reviewed:: Yes Do you feel safe going back to the place where you live?: Yes      Need for Family Participation in Patient Care: No (Comment) Care giver support system in place?: No (comment)   Criminal Activity/Legal Involvement Pertinent to Current Situation/Hospitalization: No - Comment as needed  Activities of Daily Living Home Assistive Devices/Equipment: None ADL Screening (condition at time of admission) Patient's cognitive ability adequate to safely  complete daily activities?: Yes Is the patient deaf or have difficulty hearing?: No Does the patient have difficulty seeing, even when wearing glasses/contacts?: No Does the patient have difficulty concentrating, remembering, or making decisions?: Yes Patient able to express need for assistance with ADLs?: Yes Does the patient have difficulty dressing or bathing?: No Independently performs ADLs?: Yes (appropriate for developmental age) Does the patient have difficulty walking or climbing stairs?: No Weakness of Legs: Both Weakness of Arms/Hands: Both  Permission Sought/Granted   Permission granted to share information with : No              Emotional Assessment Appearance:: Appears older than stated age Attitude/Demeanor/Rapport: Engaged Affect (typically observed): Accepting Orientation: : Oriented to Self, Oriented to Place, Oriented to  Time, Oriented to Situation Alcohol / Substance Use: Alcohol Use, Illicit Drugs Psych Involvement: No (comment)  Admission diagnosis:  PID (acute pelvic inflammatory disease) [N73.0] Nausea and vomiting, unspecified vomiting type [R11.2] Patient Active Problem List   Diagnosis Date Noted   PID (acute pelvic inflammatory disease) 05/12/2022   Tobacco abuse 05/12/2022   History of CVA (cerebrovascular accident) 05/12/2022   Pelvic abscess in female    Alcohol dependence ([place]) 05/02/2022   Hypokalemia 05/02/2022   Hepatic steatosis 05/01/2022   Abdominal pain 05/01/2022   Nausea and vomiting 05/01/2022   Cocaine abuse ([place]) 05/01/2022   Acute CVA (cerebrovascular accident) ([place]) 03/21/2020   Hypertensive urgency 03/21/2020   Bronchitis 03/21/2020  Asymptomatic bacteriuria 03/21/2020   Acute respiratory failure with hypoxia ([place]) 08/01/2017   Substance abuse ([place]) 08/01/2017   Constipation 08/01/2017   Anemia 08/01/2017   Rectal bleeding 08/01/2017   Alcohol withdrawal ([place]) 07/31/2017   Ventral hernia 10/04/2016   Incarcerated  ventral hernia 10/03/2016   Anxiety 02/15/2014   Hypertension 02/15/2014   PCP:  Patient, No Pcp Per Pharmacy:   [facility] [place] [place] [address] Phone: [phone] Fax: [phone]     Social Determinants of Health (SDOH) Interventions Food Insecurity Interventions: Intervention Not Indicated Utilities Interventions: Other (Comment) (Provided community resources)  Readmission Risk Interventions    05/13/2022   10:08 AM 05/08/2022   11:43 AM  Readmission Risk Prevention Plan  Post Dischage Appt  Complete  Transportation Screening Complete Complete  PCP or Specialist Appt within 3-5 Days Complete   HRI or [place] Complete   Social Work Consult for [place] Planning/Counseling Complete   Palliative Care Screening Not Applicable   Medication Review Press photographer) Complete

## 2022-05-13 NOTE — Progress Notes (Signed)
Mobility Specialist - Progress Note   05/13/22 0908  Mobility  Activity Ambulated with assistance in hallway  Level of Assistance Modified independent, requires aide device or extra time  Assistive Device None  Distance Ambulated (ft) 1250 ft  Activity Response Tolerated well  $Mobility charge 1 Mobility   Pt received in bed and agreed for mobility. No c/o pain nor discomfort during ambulation. Pt to chair with all needs met.   Roderick Pee Mobility Specialist

## 2022-05-13 NOTE — Progress Notes (Signed)
Pts right extremity became cold to touch from shoulder  to finger tips and pale in color. Pulses +2, IV in good condition, nothing is tight or constricting. Applied heat pac, informed on call continuing to monitor.

## 2022-05-13 NOTE — Progress Notes (Signed)
   05/13/22 0702  Neurological  Neuro (WDL) X  Orientation Level Oriented X4  Cognition Appropriate at baseline;Appropriate attention/concentration;Appropriate judgement;Appropriate safety awareness;Appropriate for developmental age  Speech Clear  R Pupil Size (mm) 4  R Pupil Shape Round  R Pupil Reaction Brisk  L Pupil Size (mm) 4  L Pupil Shape Round  L Pupil Reaction Brisk  Motor Function/Sensation Assessment Grip;Motor response  R Hand Grip Moderate  L Hand Grip Moderate  RUE Motor Response Purposeful movement;Responds to commands  RUE Sensation Full sensation  RUE Motor Strength 5  LUE Motor Response Purposeful movement;Responds to commands  LUE Sensation Full sensation  LUE Motor Strength 4  RLE Motor Response Purposeful movement;Responds to commands  RLE Sensation Full sensation  RLE Motor Strength 5  LLE Motor Response Purposeful movement;Responds to commands  LLE Sensation Full sensation  LLE Motor Strength 5  Neuro Symptoms Anxiety  ECG Monitoring  PR interval 0.19  QRS interval 0.1  QT interval 0.41  QTc interval 0.43  CV Strip Heart Rate 65  Cardiac Rhythm NSR   Pt c/o seeing purple and black dots when closing her eyes for about 15 sec. Day shift was in room assisting with assessment. Informed charge. No signs of stroke in the face no drooling and pt A&O x4. Day shift RN Lorre Nick if she wants to call Rapid RN to assess to do so. Both arms feel cool to touch not just the right.

## 2022-05-13 NOTE — Procedures (Signed)
  Procedure:  CT aspiration L pelvic collection 28ml clear yellow, sent for GS, C&S Preprocedure diagnosis: Abscess Postprocedure diagnosis: seroma EBL:    minimal Complications:   none immediate  See full dictation in [employer].  Dillard Cannon MD Main # [phone] Pager  [phone] Mobile [phone]

## 2022-05-13 NOTE — Discharge Summary (Signed)
AMA  Patient at this time expresses desire to leave the Hospital immidiately, patient has been warned that this is not Medically advisable at this time, and can result in Medical complications like Death and Disability, patient understands and accepts the risks involved and assumes full responsibilty of this decision. I made my best effort to convince him to stay.  Lanae Boast M.D on 05/13/2022 at 5:53 PM  [facility]  Time < 30 minutes  Last Note Below    PROGRESS NOTE [name]  GYF:[id number] DOB: 17-Apr-1986 DOA: 05/12/2022 PCP: Patient, No Pcp Per   Brief Narrative/Hospital Course: 36 y.o.f w/ history significant of CVA, cirrhosis of the liver, HTN, cocaine abuse, EtOH abuse presented with  N/V,abdominal pain who was supposed to have pelvic abscesses drained 9/18, but she decided to leave AMA ( admit 9/8- AMA 9/18) and returned with the same pain.  She had multiple Cts- last one 05/10/22 "multiple rim-enhancing fluid collections with inflammatory changes in the bilateral adnexa appears similar but slightly enlarged, 2 new fluid collections appeared.  Findings favored to reflect PID".  She had heaviness on the right side unclear onset MRI showed multiple small infarcts right pons left predominant right thalamus, corpus callosum and left frontal white matter. In the ED WBC count 7.4, BMP with stable potassium, bicarbonate and renal function and LFTs. Patient placed IV antibiotic and IR reconsulted.       Subjective: Seen and examined this morning resting comfortably on the bedside chair mild abdominal pain Moving all extremities well no visual deficits.  Nursing reports she was complaining of purplish spots this am but transient. ' Assessment and Plan: Principal Problem:   PID (acute pelvic inflammatory disease) Active Problems:    Hypertension   Cocaine abuse (HCC)   Alcohol dependence (HCC)   Tobacco abuse   History of CVA (cerebrovascular accident)   PID with multiple rim-enhancing fluid collections with associated inflammatory changes in the bilateral adnexa: Continue Cipro, Flagyl.  Was seen by OB/GYN previously.  Awaiting IR drainage and culture today continue pain control await ID inputs.  Recent acute multiple punctate FFM:[id number] work-up was completed with CT no LVO, new severe distal left A2 stenosis Echo EF 60 to 65%, G2 DD limited bubble study, A1c 5.4 LDL 84 seen by neurology advised dual antiplatelet x90 days followed by aspirin alone, Plavix not started pending work-up for intra-abdominal infection.  IR okay with aspirin, also start Lipitor 40.  EtOH abuse Cocaine abuse Tobacco abuse Tendency to leave AGAINST MEDICAL ADVICE: Continue thiamine, vitamins cessation advised.  Advised not to leave AGAINST MEDICAL ADVICE before completion of the treatment  HTN: BP well controlled, on clonidine and  Class I Obesity:Patient's Body mass index is 31 kg/m. : Will benefit with PCP follow-up, weight loss  healthy lifestyle and outpatient sleep evaluation.   DVT prophylaxis: SCDs Start: 05/12/22 1659.  Holding chemical prophylaxis for procedure Code Status:   Code Status: Full Code Family Communication: plan of care discussed with patient at bedside. Patient status is: Inpatient because of PID with fluid collection Level of care: Telemetry   Dispo: The patient is from: home  Anticipated disposition: home TBD  Mobility Assessment (last 72 hours)     Mobility Assessment     Row Name 05/12/22 1730           Does patient have an order for bedrest or is patient medically unstable No - Continue assessment       What is the highest level of mobility based on the progressive mobility assessment? Level 5 (Walks with assist in room/hall) - Balance while stepping forward/back and can walk in room with  assist - Complete                 Objective: Vitals last 24 hrs: Vitals:   05/13/22 1226 05/13/22 1230 05/13/22 1252 05/13/22 1635  BP: 114/77 94/67 99/65  99/71  Pulse: 68 (!) 57 (!) 56 (!) 51  Resp: 16 14 20 16   Temp:   97.9 F (36.6 C) 97.7 F (36.5 C)  TempSrc:   Oral Oral  SpO2: 100% 100% 94% 94%  Weight:      Height:       Weight change:   Physical Examination: General exam: alert awake,older than stated age, weak appearing. HEENT:Oral mucosa moist, Ear/Nose WNL grossly, dentition normal. Respiratory system: bilaterally diminished BS, no use of accessory muscle Cardiovascular system: S1 & S2 +, No JVD. Gastrointestinal system: Abdomen soft, mildly tender lower abdomen soft , BS+ Nervous System:Alert, awake, moving extremities and grossly nonfocal-sensory intact bilaterally, vision intact no nystagmus Extremities: LE edema NEG,distal peripheral pulses palpable.  Skin: No rashes,no icterus. MSK: Normal muscle bulk,tone, power  Medications reviewed:  Scheduled Meds:  aspirin EC  81 mg Oral Daily   atorvastatin  40 mg Oral Daily   cloNIDine  0.1 mg Oral BID   clopidogrel  75 mg Oral Daily   doxycycline  100 mg Oral D32K   folic acid  1 mg Oral Daily   multivitamin with minerals  1 tablet Oral Daily   nicotine  14 mg Transdermal Daily   pantoprazole  40 mg Oral Daily   propranolol  40 mg Oral BID   thiamine  100 mg Oral Daily   Or   thiamine  100 mg Intravenous Daily   Continuous Infusions:  cefTRIAXone (ROCEPHIN)  IV Stopped (05/13/22 1356)   metronidazole Stopped (05/13/22 1542)      Diet Order             DIET SOFT Room service appropriate? Yes; Fluid consistency: Thin  Diet effective now                            Intake/Output Summary (Last 24 hours) at 05/13/2022 1753 Last data filed at 05/13/2022 1629 Gross per 24 hour  Intake 500.04 ml  Output --  Net 500.04 ml   Net IO Since Admission: 1,500.04 mL [05/13/22 1753]  Wt Readings  from Last 3 Encounters:  05/12/22 72 kg  05/01/22 72 kg  03/20/20 68.9 kg     Unresulted Labs (From admission, onward)     Start     Ordered   05/14/22 0254  Basic metabolic panel  Daily at 5am,   R      05/13/22 1051   05/14/22 0500  CBC  Daily at 5am,   R      05/13/22 1051   05/13/22 1357  Culture, body fluid w Gram Stain-bottle  Once,   R        05/13/22 1357   05/13/22 1357  Gram stain  Once,   R        05/13/22 1357   05/12/22 1228  Rapid urine drug screen (hospital performed)  ONCE - STAT,   STAT        05/12/22 1227   05/12/22 1201  Urinalysis, Routine w reflex microscopic  (ED Abdominal Pain)  Once,   URGENT        05/12/22 1200          Data Reviewed: I have personally reviewed following labs and imaging studies CBC: Recent Labs  Lab 05/07/22 0528 05/08/22 0452 05/10/22 0700 05/12/22 1323 05/13/22 0536  WBC 5.5 5.6 5.3 7.4 7.5  NEUTROABS  --   --   --  5.0  --   HGB 11.2* 10.2* 11.0* 13.6 11.7*  HCT 36.5 31.8* 35.0* 40.8 37.3  MCV 97.6 94.6 95.4 92.9 95.9  PLT 283 327 644* 830* 746*   Basic Metabolic Panel: Recent Labs  Lab 05/07/22 0528 05/08/22 0452 05/10/22 0700 05/12/22 1323 05/13/22 0536  NA 135 138 137 140 143  K 3.6 3.4* 4.0 4.2 4.5  CL 105 106 104 107 111  CO2 GLUCOSE 91 101* 99 105* 98  BUN CREATININE 0.60 0.50 0.64 0.71 0.76  CALCIUM 8.7* 8.7* 8.9 9.6 9.3  MG 1.8 1.8  --   --   --   PHOS 4.0  --   --   --   --    GFR: Estimated Creatinine Clearance: 84.4 mL/min (by C-G formula based on SCr of 0.76 mg/dL). Liver Function Tests: Recent Labs  Lab 05/07/22 0528 05/08/22 0452 05/12/22 1323 05/13/22 0536  AST 10* 12* 31 26  ALT ALKPHOS 71 76 119 91  BILITOT 0.7 0.4 0.3 0.3  PROT 6.2* 6.2* 7.9 6.4*  ALBUMIN 2.7* 2.5* 3.7 3.3*   Recent Labs  Lab 05/12/22 1323  LIPASE 37   Recent Labs  Lab 05/12/22 1324  AMMONIA 24   Coagulation Profile: Recent Labs  Lab 05/12/22 1323  INR  1.1   BNP (last 3 results) No results for input(s): "PROBNP" in the last 8760 hours. HbA1C: No results for input(s): "HGBA1C" in the last 72 hours. CBG: No results for input(s): "GLUCAP" in the last 168 hours. Lipid Profile: No results for input(s): "CHOL", "HDL", "LDLCALC", "TRIG", "CHOLHDL", "LDLDIRECT" in the last 72 hours. Thyroid Function Tests: No results for input(s): "TSH", "T4TOTAL", "FREET4", "T3FREE", "THYROIDAB" in the last 72 hours. Sepsis Labs: No results for input(s): "PROCALCITON", "LATICACIDVEN" in the last 168 hours.  No results found for this or any previous visit (from the past 240 hour(s)).  Antimicrobials: Anti-infectives (From admission, onward)    Start     Dose/Rate Route Frequency Ordered Stop   05/13/22 1300  doxycycline (VIBRA-TABS) tablet 100 mg        100 mg Oral Every 12 hours 05/13/22 1214     05/13/22 1300  cefTRIAXone (ROCEPHIN) 2 g in sodium chloride 0.9 % 100 mL IVPB        2 g 200 mL/hr over 30 Minutes Intravenous Every 24 hours 05/13/22 1214     05/13/22 0100  metroNIDAZOLE (FLAGYL) IVPB 500 mg        500 mg 100 mL/hr over 60 Minutes Intravenous Every 12 hours 05/12/22 1658     05/13/22 0100  ciprofloxacin (CIPRO) IVPB 400 mg  Status:  Discontinued  400 mg 200 mL/hr over 60 Minutes Intravenous Every 12 hours 05/12/22 1658 05/13/22 1214   05/13/22 0000  doxycycline (VIBRA-TABS) 100 MG tablet        100 mg Oral Every 12 hours 05/13/22 1752 05/23/22 2359   05/13/22 0000  metroNIDAZOLE (FLAGYL) 500 MG tablet        500 mg Oral 2 times daily 05/13/22 1752 05/27/22 2359   05/13/22 0000  ciprofloxacin (CIPRO) 500 MG tablet        500 mg Oral 2 times daily 05/13/22 1752 05/27/22 2359   05/12/22 1245  ciprofloxacin (CIPRO) IVPB 400 mg        400 mg 200 mL/hr over 60 Minutes Intravenous NOW 05/12/22 1235 05/12/22 1420   05/12/22 1245  metroNIDAZOLE (FLAGYL) IVPB 500 mg        500 mg 100 mL/hr over 60 Minutes Intravenous  Once 05/12/22 1235  05/12/22 1418      Culture/Microbiology    Component Value Date/Time   SDES  05/03/2022 0531    LEFT ANTECUBITAL BLOOD Performed at [facility], [address], [place], [place] [address]    SPECREQUEST  05/03/2022 0531    AEROBIC BOTTLE ONLY Blood Culture adequate volume Performed at [facility], [address] [place], [place] [address]    CULT  05/03/2022 0531    NO GROWTH 5 DAYS Performed at [facility], [address] [place], [place] [address]    REPTSTATUS 05/08/2022 FINAL 05/03/2022 0531   Radiology Studies: CT ASPIRATION N/S  Result Date: 05/13/2022 CLINICAL DATA:  Lower abdominal pain, nausea, vomiting. Left lower quadrant/pelvic fluid collections. EXAM: CT GUIDED ASPIRATION BIOPSY OF LEFT PELVIS ANESTHESIA/SEDATION: Intravenous Fentanyl and Versed 2mg  were administered as conscious sedation during continuous monitoring of the patient's level of consciousness and physiological / cardiorespiratory status by the radiology RN, with a total moderate sedation time of 11 minutes. PROCEDURE: The procedure risks, benefits, and alternatives were explained to the patient. Questions regarding the procedure were encouraged and answered. The patient understands and consents to the procedure. select axial scans through the pelvis were obtained. left cystic collections in the pelvis were localized anteromedial to the psoas musculature. An appropriate skin entry site was determined and marked. The operative field was prepped with chlorhexidinein a sterile fashion, and a sterile drape was applied covering the operative field. A sterile gown and sterile gloves were used for the procedure. Local anesthesia was provided with 1% Lidocaine. Under CT fluoroscopic guidance, a 15 cm Yueh multi side-hole sheath needle was advanced into the collection. 45 mL of clear yellow fluid were aspirated, sent for Gram stain and culture. Follow-up CT shows virtually complete  decompression of the collections. No hemorrhage or other apparent complication. The patient tolerated the procedure well. COMPLICATIONS: None immediate FINDINGS: Thin walled left pelvic cystic collections were localized, stable appearance since 05/10/2022. 45 mL clear yellow fluid was aspirated, virtually completely decompressing the process. No drain was placed. IMPRESSION: 1. Technically successful CT-guided aspiration of left pelvic fluid collections, sample sent for Gram stain and culture. RADIATION DOSE REDUCTION: This exam was performed according to the departmental dose-optimization program which includes automated exposure control, adjustment of the mA and/or kV according to patient size and/or use of iterative reconstruction technique. Electronically Signed   By: 05/12/2022 M.D.   On: 05/13/2022 14:46     LOS: 1 day   05/15/2022, MD [facility]  05/13/2022, 5:53 PM

## 2022-05-13 NOTE — Progress Notes (Signed)
Patient left AMA Dr. Maren Beach notified pt educated on risk of leaving AMA, pt verbalized she understood but needed to go to work in the morning at [phone]. AMA paperwork signed by pt and pt ambulated out with no staff.

## 2022-05-13 NOTE — Progress Notes (Signed)
Patient left room to go for procedure.

## 2022-05-14 LAB — CYTOLOGY - PAP
Comment: NEGATIVE
Diagnosis: NEGATIVE
High risk HPV: NEGATIVE

## 2022-05-15 ENCOUNTER — Other Ambulatory Visit: Payer: Self-pay

## 2022-05-15 MED ORDER — DOXYCYCLINE HYCLATE 100 MG PO TABS
100.0000 mg | ORAL_TABLET | Freq: Two times a day (BID) | ORAL | 0 refills | Status: DC
  Filled 2022-05-15: qty 20, 10d supply, fill #0

## 2022-05-15 MED ORDER — ATORVASTATIN CALCIUM 40 MG PO TABS
40.0000 mg | ORAL_TABLET | Freq: Every day | ORAL | 0 refills | Status: DC
  Filled 2022-05-15: qty 30, 30d supply, fill #0

## 2022-05-15 MED ORDER — CLOPIDOGREL BISULFATE 75 MG PO TABS
75.0000 mg | ORAL_TABLET | Freq: Every day | ORAL | 0 refills | Status: DC
  Filled 2022-05-15: qty 30, 30d supply, fill #0

## 2022-05-15 MED ORDER — CIPROFLOXACIN HCL 500 MG PO TABS
500.0000 mg | ORAL_TABLET | Freq: Two times a day (BID) | ORAL | 0 refills | Status: DC
  Filled 2022-05-15: qty 28, 14d supply, fill #0

## 2022-05-15 MED ORDER — METRONIDAZOLE 500 MG PO TABS
500.0000 mg | ORAL_TABLET | Freq: Two times a day (BID) | ORAL | 0 refills | Status: AC
  Filled 2022-05-15: qty 28, 14d supply, fill #0

## 2022-05-18 ENCOUNTER — Other Ambulatory Visit: Payer: Self-pay

## 2022-05-19 LAB — CULTURE, BODY FLUID W GRAM STAIN -BOTTLE: Culture: NO GROWTH

## 2022-05-27 ENCOUNTER — Telehealth: Payer: Self-pay | Admitting: General Practice

## 2022-05-27 NOTE — Telephone Encounter (Signed)
Error

## 2022-05-28 ENCOUNTER — Other Ambulatory Visit (HOSPITAL_COMMUNITY): Payer: Self-pay

## 2022-05-28 ENCOUNTER — Encounter (HOSPITAL_COMMUNITY): Payer: Self-pay

## 2022-05-28 ENCOUNTER — Ambulatory Visit (INDEPENDENT_AMBULATORY_CARE_PROVIDER_SITE_OTHER): Payer: Self-pay | Admitting: Nurse Practitioner

## 2022-05-28 ENCOUNTER — Inpatient Hospital Stay (HOSPITAL_COMMUNITY)
Admission: EM | Admit: 2022-05-28 | Discharge: 2022-05-31 | DRG: 392 | Disposition: A | Discharge status: Home / Self Care | Payer: Self-pay | Attending: Internal Medicine | Admitting: Internal Medicine

## 2022-05-28 ENCOUNTER — Encounter: Payer: Self-pay | Admitting: Nurse Practitioner

## 2022-05-28 VITALS — BP 156/90 | HR 112 | Temp 97.6°F | Resp 16 | Wt 146.2 lb

## 2022-05-28 DIAGNOSIS — Z91199 Patient's noncompliance with other medical treatment and regimen due to unspecified reason: Secondary | ICD-10-CM

## 2022-05-28 DIAGNOSIS — Z880 Allergy status to penicillin: Secondary | ICD-10-CM

## 2022-05-28 DIAGNOSIS — Z888 Allergy status to other drugs, medicaments and biological substances status: Secondary | ICD-10-CM

## 2022-05-28 DIAGNOSIS — R1031 Right lower quadrant pain: Principal | ICD-10-CM | POA: Diagnosis present

## 2022-05-28 DIAGNOSIS — I639 Cerebral infarction, unspecified: Secondary | ICD-10-CM

## 2022-05-28 DIAGNOSIS — F1020 Alcohol dependence, uncomplicated: Secondary | ICD-10-CM | POA: Diagnosis present

## 2022-05-28 DIAGNOSIS — Z7982 Long term (current) use of aspirin: Secondary | ICD-10-CM

## 2022-05-28 DIAGNOSIS — Z833 Family history of diabetes mellitus: Secondary | ICD-10-CM

## 2022-05-28 DIAGNOSIS — Z8673 Personal history of transient ischemic attack (TIA), and cerebral infarction without residual deficits: Secondary | ICD-10-CM

## 2022-05-28 DIAGNOSIS — F17210 Nicotine dependence, cigarettes, uncomplicated: Secondary | ICD-10-CM | POA: Diagnosis present

## 2022-05-28 DIAGNOSIS — F1410 Cocaine abuse, uncomplicated: Secondary | ICD-10-CM | POA: Diagnosis present

## 2022-05-28 DIAGNOSIS — Z79899 Other long term (current) drug therapy: Secondary | ICD-10-CM

## 2022-05-28 DIAGNOSIS — R52 Pain, unspecified: Secondary | ICD-10-CM | POA: Diagnosis present

## 2022-05-28 DIAGNOSIS — I10 Essential (primary) hypertension: Secondary | ICD-10-CM | POA: Diagnosis present

## 2022-05-28 DIAGNOSIS — N730 Acute parametritis and pelvic cellulitis: Secondary | ICD-10-CM

## 2022-05-28 DIAGNOSIS — K760 Fatty (change of) liver, not elsewhere classified: Secondary | ICD-10-CM | POA: Diagnosis present

## 2022-05-28 DIAGNOSIS — E872 Acidosis, unspecified: Secondary | ICD-10-CM | POA: Diagnosis present

## 2022-05-28 DIAGNOSIS — R1084 Generalized abdominal pain: Secondary | ICD-10-CM

## 2022-05-28 DIAGNOSIS — Z720 Tobacco use: Secondary | ICD-10-CM | POA: Diagnosis present

## 2022-05-28 DIAGNOSIS — N83291 Other ovarian cyst, right side: Secondary | ICD-10-CM | POA: Diagnosis present

## 2022-05-28 DIAGNOSIS — T50901A Poisoning by unspecified drugs, medicaments and biological substances, accidental (unintentional), initial encounter: Principal | ICD-10-CM

## 2022-05-28 DIAGNOSIS — R109 Unspecified abdominal pain: Secondary | ICD-10-CM | POA: Diagnosis present

## 2022-05-28 DIAGNOSIS — Z91040 Latex allergy status: Secondary | ICD-10-CM

## 2022-05-28 DIAGNOSIS — R824 Acetonuria: Secondary | ICD-10-CM | POA: Diagnosis present

## 2022-05-28 DIAGNOSIS — Z885 Allergy status to narcotic agent status: Secondary | ICD-10-CM

## 2022-05-28 DIAGNOSIS — E860 Dehydration: Secondary | ICD-10-CM | POA: Diagnosis present

## 2022-05-28 DIAGNOSIS — E785 Hyperlipidemia, unspecified: Secondary | ICD-10-CM | POA: Diagnosis present

## 2022-05-28 DIAGNOSIS — Z23 Encounter for immunization: Secondary | ICD-10-CM

## 2022-05-28 DIAGNOSIS — E669 Obesity, unspecified: Secondary | ICD-10-CM | POA: Diagnosis present

## 2022-05-28 DIAGNOSIS — Z6832 Body mass index (BMI) 32.0-32.9, adult: Secondary | ICD-10-CM

## 2022-05-28 DIAGNOSIS — Z8249 Family history of ischemic heart disease and other diseases of the circulatory system: Secondary | ICD-10-CM

## 2022-05-28 DIAGNOSIS — R9389 Abnormal findings on diagnostic imaging of other specified body structures: Secondary | ICD-10-CM

## 2022-05-28 DIAGNOSIS — F419 Anxiety disorder, unspecified: Secondary | ICD-10-CM | POA: Diagnosis present

## 2022-05-28 DIAGNOSIS — E876 Hypokalemia: Secondary | ICD-10-CM | POA: Diagnosis present

## 2022-05-28 DIAGNOSIS — K7460 Unspecified cirrhosis of liver: Secondary | ICD-10-CM | POA: Diagnosis present

## 2022-05-28 MED ORDER — MELOXICAM 7.5 MG PO TABS
7.5000 mg | ORAL_TABLET | Freq: Every day | ORAL | 0 refills | Status: DC
  Filled 2022-05-28 (×2): qty 30, 30d supply, fill #0

## 2022-05-28 MED ORDER — CLONIDINE HCL 0.1 MG PO TABS
0.1000 mg | ORAL_TABLET | Freq: Once | ORAL | Status: AC
  Administered 2022-05-28: 0.1 mg via ORAL

## 2022-05-28 NOTE — ED Triage Notes (Signed)
Patient arrived stating she thought she was snorting cocaine for pain control, found unresponsive not breathing by family, given 4 of narcan prior to EMS arrival.

## 2022-05-28 NOTE — Patient Instructions (Addendum)
Note: Patient refuses to go back to the ED - advised to go with any worsening symptoms  1. Primary hypertension  - cloNIDine (CATAPRES) tablet 0.1 mg  2. PID (acute pelvic inflammatory disease)  - Ambulatory referral to Obstetrics / Gynecology  3. Abnormal CT scan  - Ambulatory referral to Gastroenterology  4. Generalized abdominal pain  - Ambulatory referral to Gastroenterology  5. Acute CVA (cerebrovascular accident) [facility])  - Ambulatory referral to Neurology   Follow up:  Follow up in 3 months or sooner - please go to the ED with any worsening symptoms

## 2022-05-28 NOTE — Assessment & Plan Note (Signed)
Note: Patient refuses to go back to the ED - advised to go with any worsening symptoms  1. Primary hypertension  - cloNIDine (CATAPRES) tablet 0.1 mg  2. PID (acute pelvic inflammatory disease)  - Ambulatory referral to Obstetrics / Gynecology  3. Abnormal CT scan  - Ambulatory referral to Gastroenterology  4. Generalized abdominal pain  - Ambulatory referral to Gastroenterology  5. Acute CVA (cerebrovascular accident) (HCC)  - Ambulatory referral to Neurology   Follow up:  Follow up in 3 months or sooner - please go to the ED with any worsening symptoms   

## 2022-05-28 NOTE — Progress Notes (Signed)
@Patient  ID: Dierdre Highman, female    DOB: 28-Oct-1985, 36 y.o.   MRN: [id number]  Chief Complaint  Patient presents with   Hospitalization Follow-up    Referring provider: No ref. provider found  Recent significant events:  Brief Narrative/Hospital Course: 36 y.o.f w/ history significant of CVA, cirrhosis of the liver, HTN, cocaine abuse, EtOH abuse presented with  N/V,abdominal pain who was supposed to have pelvic abscesses drained 9/18, but she decided to leave AMA ( admit 9/8- AMA 9/18) and returned with the same pain.  She had multiple Cts- last one 05/10/22 "multiple rim-enhancing fluid collections with inflammatory changes in the bilateral adnexa appears similar but slightly enlarged, 2 new fluid collections appeared.  Findings favored to reflect PID".  She had heaviness on the right side unclear onset MRI showed multiple small infarcts right pons left predominant right thalamus, corpus callosum and left frontal white matter. In the ED WBC count 7.4, BMP with stable potassium, bicarbonate and renal function and LFTs. Patient placed IV antibiotic and IR reconsulted.         Subjective: Seen and examined this morning resting comfortably on the bedside chair mild abdominal pain Moving all extremities well no visual deficits.  Nursing reports she was complaining of purplish spots this am but transient. ' Assessment and Plan: Principal Problem:   PID (acute pelvic inflammatory disease) Active Problems:   Hypertension   Cocaine abuse ([place])   Alcohol dependence ([place])   Tobacco abuse   History of CVA (cerebrovascular accident)   PID with multiple rim-enhancing fluid collections with associated inflammatory changes in the bilateral adnexa: Continue Cipro, Flagyl.  Was seen by OB/GYN previously.  Awaiting IR drainage and culture today continue pain control await ID inputs.   Recent acute multiple punctate YWV:PXTGGY work-up was completed with CT no LVO, new severe distal left A2  stenosis Echo EF 60 to 65%, G2 DD limited bubble study, A1c 5.4 LDL 84 seen by neurology advised dual antiplatelet x90 days followed by aspirin alone, Plavix not started pending work-up for intra-abdominal infection.  IR okay with aspirin, also start Lipitor 40.  EtOH abuse Cocaine abuse Tobacco abuse Tendency to leave AGAINST MEDICAL ADVICE: Continue thiamine, vitamins cessation advised.  Advised not to leave [place] before completion of the treatment   HTN: BP well controlled, on clonidine and   Class I Obesity:Patient's Body mass index is 31 kg/m. : Will benefit with PCP follow-up, weight loss  healthy lifestyle and outpatient sleep evaluation    HPI  36 y.o.f w/ history significant of CVA, cirrhosis of the liver, HTN, cocaine abuse, EtOH abuse   Patient presents today for hospital follow-up and to establish care.  She was recently admitted to the above.  Patient left AMA.  The physician in the for her although she did need IV antibiotics.  She leaves tomorrow.  He states that she is still having significant abdominal pain.  Her symptoms worsen she does need to go to the emergency she states that she has been taking Tylenol and Motrin.  She does have Motrin listed as she can take it adverse reaction.  CT scan in the ED noted cyst to her liver, fluid collection with associated inflammatory change to bilateral adnexa and sigmoid colon.  The scan also noted multiple abdominal lymph nodes at the upper limit of normal that were favored to be reactive.  We will refer patient to OB/GYN and GI evaluation.  Patient also had recent acute stroke and does need  referral to neurology as well.  She is currently on Plavix and Lipitor her blood pressure is elevated in office today.  We will give her a dose of clonidine in office and restart her previous prescription of clonidine from home. Denies f/c/s, n/v/d, hemoptysis, PND, leg swelling Denies chest pain or edema   Allergies  Allergen  Reactions   Ibuprofen Anaphylaxis, Swelling and Other (See Comments)    Patient thinks it may have caused her throat to "swell"  (** PATIENT HAS BEEN TOLERATING THIS IN 2023**)   Latex Shortness Of Breath   Peanut-Containing Drug Products Anaphylaxis   Penicillins Anaphylaxis and Hives    Has patient had a PCN reaction causing immediate rash, facial/tongue/throat swelling, SOB or lightheadedness with hypotension: Yes Has patient had a PCN reaction causing severe rash involving mucus membranes or skin necrosis: unknown Has patient had a PCN reaction that required hospitalization No Has patient had a PCN reaction occurring within the last 10 years: No If all of the above answers are "NO", then may proceed with Cephalosporin use. Tolerated ceftriaxone 2018 & 2019    Acetaminophen Other (See Comments)    Causes patient to have an upset stomach- Tolerating this in 2023 (325 mg tablets)   Codeine Hives and Itching   Lisinopril Swelling   Ativan [Lorazepam] Other (See Comments)    "Hallucinations," per patient    Immunization History  Administered Date(s) Administered   Influenza,inj,Quad PF,6+ Mos 06/17/2015, 09/23/2016, 08/03/2017   Pneumococcal Polysaccharide-23 08/04/2017   Tdap 07/30/2013, 05/08/2016    Past Medical History:  Diagnosis Date   Anxiety    Cirrhosis of liver (HCC)    Cocaine use 08/04/2018   History of cocaine use 08/04/2018   Hypertension    Polysubstance abuse (HCC)    Renal disorder    Kidney Infection     Tobacco History: Social History   Tobacco Use  Smoking Status Every Day   Packs/day: 0.50   Types: Cigarettes  Smokeless Tobacco Never   Ready to quit: Not Answered Counseling given: Not Answered   Outpatient Encounter Medications as of 05/28/2022  Medication Sig   meloxicam (MOBIC) 7.5 MG tablet Take 1 tablet (7.5 mg total) by mouth daily.   acetaminophen (TYLENOL) 325 MG tablet Take 325-650 mg by mouth every 6 (six) hours as needed for mild  pain or headache.   aspirin EC 81 MG tablet Take 1 tablet (81 mg total) by mouth daily. Swallow whole.   atorvastatin (LIPITOR) 40 MG tablet Take 1 tablet (40 mg total) by mouth daily.   chlordiazePOXIDE (LIBRIUM) 25 MG capsule Take 1 capsule (25 mg total) by mouth 3 (three) times daily as needed for withdrawal. (Patient not taking: Reported on 05/12/2022)   ciprofloxacin (CIPRO) 500 MG tablet Take 1 tablet (500 mg total) by mouth 2 (two) times daily for 14 days.   cloNIDine (CATAPRES) 0.1 MG tablet Take 1 tablet (0.1 mg total) by mouth 2 (two) times daily.   clopidogrel (PLAVIX) 75 MG tablet Take 1 tablet (75 mg total) by mouth daily.   doxycycline (VIBRA-TABS) 100 MG tablet Take 1 tablet (100 mg total) by mouth every 12 (twelve) hours for 10 days.   metroNIDAZOLE (FLAGYL) 500 MG tablet Take 1 tablet (500 mg total) by mouth 2 (two) times daily for 14 days.   omeprazole (PRILOSEC) 40 MG capsule Take 1 capsule (40 mg total) by mouth daily. (Patient not taking: Reported on 05/12/2022)   propranolol (INDERAL) 40 MG tablet Take 1 tablet (40  mg total) by mouth 2 (two) times daily. (Patient not taking: Reported on 05/12/2022)   [DISCONTINUED] atorvastatin (LIPITOR) 40 MG tablet Take 1 tablet (40 mg total) by mouth daily.   [DISCONTINUED] clopidogrel (PLAVIX) 75 MG tablet Take 1 tablet (75 mg total) by mouth daily.   [EXPIRED] cloNIDine (CATAPRES) tablet 0.1 mg    No facility-administered encounter medications on file as of 05/28/2022.     Review of Systems  Review of Systems  Constitutional: Negative.   HENT: Negative.    Cardiovascular: Negative.   Gastrointestinal:  Positive for abdominal pain.  Allergic/Immunologic: Negative.   Neurological: Negative.   Psychiatric/Behavioral: Negative.         Physical Exam  BP (!) 156/90   Pulse (!) 112   Temp 97.6 F (36.4 C)   Resp 16   Wt 146 lb 4 oz (66.3 kg)   LMP 05/01/2022 (Approximate) Comment: negative hcg blood test 05-01-2022  SpO2 100%    BMI 28.56 kg/m   Wt Readings from Last 5 Encounters:  05/28/22 146 lb 4 oz (66.3 kg)  05/12/22 158 lb 11.7 oz (72 kg)  05/01/22 158 lb 11.7 oz (72 kg)  03/20/20 152 lb (68.9 kg)  08/04/18 145 lb (65.8 kg)     Physical Exam Vitals and nursing note reviewed.  Constitutional:      General: She is not in acute distress.    Appearance: She is well-developed.  Cardiovascular:     Rate and Rhythm: Normal rate and regular rhythm.  Pulmonary:     Effort: Pulmonary effort is normal.     Breath sounds: Normal breath sounds.  Neurological:     Mental Status: She is alert and oriented to person, place, and time.      Lab Results:  CBC    Component Value Date/Time   WBC 7.5 05/13/2022 0536   RBC 3.89 05/13/2022 0536   HGB 11.7 (L) 05/13/2022 0536   HCT 37.3 05/13/2022 0536   PLT 746 (H) 05/13/2022 0536   MCV 95.9 05/13/2022 0536   MCH 30.1 05/13/2022 0536   MCHC 31.4 05/13/2022 0536   RDW 15.6 (H) 05/13/2022 0536   LYMPHSABS 1.6 05/12/2022 1323   MONOABS 0.5 05/12/2022 1323   EOSABS 0.1 05/12/2022 1323   BASOSABS 0.0 05/12/2022 1323    BMET    Component Value Date/Time   NA 143 05/13/2022 0536   K 4.5 05/13/2022 0536   CL 111 05/13/2022 0536   CO2 23 05/13/2022 0536   GLUCOSE 98 05/13/2022 0536   BUN 20 05/13/2022 0536   CREATININE 0.76 05/13/2022 0536   CREATININE 0.69 09/23/2016 1442   CALCIUM 9.3 05/13/2022 0536   GFRNONAA >60 05/13/2022 0536   GFRNONAA >89 09/23/2016 1442   GFRAA >60 05/09/2020 0524   GFRAA >89 09/23/2016 1442    BNP No results found for: "BNP"  ProBNP No results found for: "PROBNP"  Imaging: CT ASPIRATION N/S  Result Date: 05/13/2022 CLINICAL DATA:  Lower abdominal pain, nausea, vomiting. Left lower quadrant/pelvic fluid collections. EXAM: CT GUIDED ASPIRATION BIOPSY OF LEFT PELVIS ANESTHESIA/SEDATION: Intravenous Fentanyl and Versed 2mg  were administered as conscious sedation during continuous monitoring of the patient's  level of consciousness and physiological / cardiorespiratory status by the radiology RN, with a total moderate sedation time of 11 minutes. PROCEDURE: The procedure risks, benefits, and alternatives were explained to the patient. Questions regarding the procedure were encouraged and answered. The patient understands and consents to the procedure. select axial scans through the  pelvis were obtained. left cystic collections in the pelvis were localized anteromedial to the psoas musculature. An appropriate skin entry site was determined and marked. The operative field was prepped with chlorhexidinein a sterile fashion, and a sterile drape was applied covering the operative field. A sterile gown and sterile gloves were used for the procedure. Local anesthesia was provided with 1% Lidocaine. Under CT fluoroscopic guidance, a 15 cm Yueh multi side-hole sheath needle was advanced into the collection. 45 mL of clear yellow fluid were aspirated, sent for Gram stain and culture. Follow-up CT shows virtually complete decompression of the collections. No hemorrhage or other apparent complication. The patient tolerated the procedure well. COMPLICATIONS: None immediate FINDINGS: Thin walled left pelvic cystic collections were localized, stable appearance since 05/10/2022. 45 mL clear yellow fluid was aspirated, virtually completely decompressing the process. No drain was placed. IMPRESSION: 1. Technically successful CT-guided aspiration of left pelvic fluid collections, sample sent for Gram stain and culture. RADIATION DOSE REDUCTION: This exam was performed according to the departmental dose-optimization program which includes automated exposure control, adjustment of the mA and/or kV according to patient size and/or use of iterative reconstruction technique. Electronically Signed   By: Corlis Leak M.D.   On: 05/13/2022 14:46   CT ABDOMEN PELVIS W CONTRAST  Result Date: 05/10/2022 CLINICAL DATA:  Lower abdominal pain, nausea  and vomiting. EXAM: CT ABDOMEN AND PELVIS WITH CONTRAST TECHNIQUE: Multidetector CT imaging of the abdomen and pelvis was performed using the standard protocol following bolus administration of intravenous contrast. RADIATION DOSE REDUCTION: This exam was performed according to the departmental dose-optimization program which includes automated exposure control, adjustment of the mA and/or kV according to patient size and/or use of iterative reconstruction technique. CONTRAST:  OMNIPAQUE IOHEXOL 300 MG/ML  SOLN COMPARISON:  CT abdomen pelvis dated 05/04/2022. FINDINGS: Lower chest: No acute abnormality. Hepatobiliary: There is an 8 mm cyst in the liver. No imaging follow-up is recommended for this finding. No gallstones, gallbladder wall thickening, or biliary dilatation. Pancreas: Unremarkable. No pancreatic ductal dilatation or surrounding inflammatory changes. Spleen: Normal in size without focal abnormality. Adrenals/Urinary Tract: Adrenal glands are unremarkable. Kidneys are normal, without renal calculi, focal lesion, or hydronephrosis. Bladder is unremarkable. Stomach/Bowel: Stomach is within normal limits. Enteric contrast reaches the distal small bowel. The appendix is not definitely seen. No evidence of bowel obstruction. Vascular/Lymphatic: A few periaortic lymph nodes are at the upper limits of normal, measuring 10 mm in short axis (series 2, image 41 and image 49). A mesenteric lymph node is at the upper limits of normal, measuring 10 mm in short axis (series 2, image 53). No significant vascular findings are present. Reproductive: Multiple fluid collections with rim enhancement and associated inflammatory changes in the bilateral adnexa appear similar to prior exam. These inflammatory changes are in close proximity with a loop of small bowel and the sigmoid colon. A fluid collection in the left adnexa without associated rim enhancement has increased in size (4.7 x 2.8 x 3.4 cm on series 2, image  61 and series 4, image 38). A fluid collection near the sigmoid colon without rim enhancement is new since prior exam (2.4 x 3.3 x 2.4 cm on series 2, image 63 and series 4, image 31). Other: No abdominal wall hernia or abnormality. Musculoskeletal: No acute or significant osseous findings. IMPRESSION: 1. Multiple rim enhancing fluid collections with associated inflammatory changes in the bilateral adnexa appear similar to prior exam and two fluid collections without significant rim enhancement are  new since 05/04/2022. These findings are favored to reflect pelvic inflammatory disease as opposed to enteritis as the primary etiology. 2. Inflammatory changes involving a loop of small bowel and the sigmoid colon as well as multiple abdominal lymph nodes at the upper limits of normal are favored to be reactive. Electronically Signed   By: Romona Curls M.D.   On: 05/10/2022 16:06   ECHOCARDIOGRAM LIMITED BUBBLE STUDY  Result Date: 05/07/2022    ECHOCARDIOGRAM LIMITED REPORT   Patient Name:   RHYLI DEPAULA Date of Exam: 05/07/2022 Medical Rec #:  [id number]          Height:       60.0 in Accession #:    [id number]         Weight:       158.7 lb Date of Birth:  01-Jul-1986           BSA:          1.692 m Patient Age:    36 years           BP:           135/84 mmHg Patient Gender: F                  HR:           76 bpm. Exam Location:  Inpatient Procedure: Limited Echo and Saline Contrast Bubble Study Indications:    stroke  History:        Patient has prior history of Echocardiogram examinations, most                 recent 05/06/2022. Stroke.  Sonographer:    Cathie Hoops Referring Phys: [id number] Malachi Carl STACK IMPRESSIONS  1. Limited bubble study for stroke  2. Left ventricular ejection fraction, by estimation, is 60 to 65%. The left ventricle has normal function.  3. Agitated saline contrast bubble study was negative, with no evidence of any interatrial shunt. Comparison(s): 05/06/2022: LVEF 60-65%. FINDINGS  Left  Ventricle: Left ventricular ejection fraction, by estimation, is 60 to 65%. The left ventricle has normal function. IAS/Shunts: No atrial level shunt detected by color flow Doppler. Agitated saline contrast was given intravenously to evaluate for intracardiac shunting. Agitated saline contrast bubble study was negative, with no evidence of any interatrial shunt. [name] MD Electronically signed by [name] MD Signature Date/Time: 05/07/2022/2:51:35 PM    Final    DG Pelvis 1-2 Views  Result Date: 05/07/2022 CLINICAL DATA:  Trauma, fall EXAM: PELVIS - 1-2 VIEW COMPARISON:  None Available. FINDINGS: No fracture or dislocation is seen.  SI joints are symmetrical. IMPRESSION: No fracture or dislocation is seen. Electronically Signed   By: Ernie Avena M.D.   On: 05/07/2022 09:36   CT ANGIO HEAD W OR WO CONTRAST  Result Date: 05/06/2022 CLINICAL DATA:  Stroke follow-up. EXAM: CT ANGIOGRAPHY HEAD AND NECK TECHNIQUE: Multidetector CT imaging of the head and neck was performed using the standard protocol during bolus administration of intravenous contrast. Multiplanar CT image reconstructions and MIPs were obtained to evaluate the vascular anatomy. Carotid stenosis measurements (when applicable) are obtained utilizing NASCET criteria, using the distal internal carotid diameter as the denominator. RADIATION DOSE REDUCTION: This exam was performed according to the departmental dose-optimization program which includes automated exposure control, adjustment of the mA and/or kV according to patient size and/or use of iterative reconstruction technique. CONTRAST:  44mL OMNIPAQUE IOHEXOL 350 MG/ML SOLN COMPARISON:  Head and neck CTA 03/21/2020 FINDINGS:  CTA NECK FINDINGS Aortic arch: Standard 3 vessel aortic arch. Widely patent arch vessel origins. Right carotid system: Patent and smooth without evidence of stenosis or dissection. Tortuous proximal ICA. Left carotid system: Patent and smooth without  evidence of stenosis or dissection. Tortuous mid cervical ICA. Vertebral arteries: Patent, smooth, and codominant without evidence of stenosis or dissection. Skeleton: Widespread dental caries and periapical lucencies. Other neck: No evidence of cervical lymphadenopathy or mass. Upper chest: Clear lung apices. Review of the MIP images confirms the above findings CTA HEAD FINDINGS Anterior circulation: The internal carotid arteries are widely patent from skull base to carotid termini. ACAs and MCAs are patent without evidence of a proximal branch occlusion or significant proximal stenosis. There is a new severe distal left A2 stenosis. No aneurysm is identified. Posterior circulation: The intracranial vertebral arteries are widely patent to the basilar. Patent PICA, AICA, and SCA origins are seen bilaterally. The basilar artery is widely patent. There is a large left posterior communicating artery with absent left P1 segment. Both PCAs are patent without evidence of a significant proximal stenosis. No aneurysm is identified. Venous sinuses: Patent. Anatomic variants: Fetal left PCA. Review of the MIP images confirms the above findings IMPRESSION: 1. No large vessel occlusion or significant proximal stenosis in the head or neck. 2. New severe distal left A2 stenosis. Electronically Signed   By: Sebastian Ache M.D.   On: 05/06/2022 18:17   CT ANGIO NECK W OR WO CONTRAST  Result Date: 05/06/2022 CLINICAL DATA:  Stroke follow-up. EXAM: CT ANGIOGRAPHY HEAD AND NECK TECHNIQUE: Multidetector CT imaging of the head and neck was performed using the standard protocol during bolus administration of intravenous contrast. Multiplanar CT image reconstructions and MIPs were obtained to evaluate the vascular anatomy. Carotid stenosis measurements (when applicable) are obtained utilizing NASCET criteria, using the distal internal carotid diameter as the denominator. RADIATION DOSE REDUCTION: This exam was performed according to the  departmental dose-optimization program which includes automated exposure control, adjustment of the mA and/or kV according to patient size and/or use of iterative reconstruction technique. CONTRAST:  75mL OMNIPAQUE IOHEXOL 350 MG/ML SOLN COMPARISON:  Head and neck CTA 03/21/2020 FINDINGS: CTA NECK FINDINGS Aortic arch: Standard 3 vessel aortic arch. Widely patent arch vessel origins. Right carotid system: Patent and smooth without evidence of stenosis or dissection. Tortuous proximal ICA. Left carotid system: Patent and smooth without evidence of stenosis or dissection. Tortuous mid cervical ICA. Vertebral arteries: Patent, smooth, and codominant without evidence of stenosis or dissection. Skeleton: Widespread dental caries and periapical lucencies. Other neck: No evidence of cervical lymphadenopathy or mass. Upper chest: Clear lung apices. Review of the MIP images confirms the above findings CTA HEAD FINDINGS Anterior circulation: The internal carotid arteries are widely patent from skull base to carotid termini. ACAs and MCAs are patent without evidence of a proximal branch occlusion or significant proximal stenosis. There is a new severe distal left A2 stenosis. No aneurysm is identified. Posterior circulation: The intracranial vertebral arteries are widely patent to the basilar. Patent PICA, AICA, and SCA origins are seen bilaterally. The basilar artery is widely patent. There is a large left posterior communicating artery with absent left P1 segment. Both PCAs are patent without evidence of a significant proximal stenosis. No aneurysm is identified. Venous sinuses: Patent. Anatomic variants: Fetal left PCA. Review of the MIP images confirms the above findings IMPRESSION: 1. No large vessel occlusion or significant proximal stenosis in the head or neck. 2. New severe distal left A2  stenosis. Electronically Signed   By: Sebastian Ache M.D.   On: 05/06/2022 18:17   ECHOCARDIOGRAM COMPLETE  Result Date:  05/06/2022    ECHOCARDIOGRAM REPORT   Patient Name:   GRISEL BLUMENSTOCK Date of Exam: 05/06/2022 Medical Rec #:  [id number]          Height:       60.0 in Accession #:    [id number]         Weight:       158.7 lb Date of Birth:  1986/01/02           BSA:          1.692 m Patient Age:    36 years           BP:           117/77 mmHg Patient Gender: F                  HR:           80 bpm. Exam Location:  Inpatient Procedure: 2D Echo, Cardiac Doppler and Color Doppler Indications:    Stroke  History:        Patient has prior history of Echocardiogram examinations, most                 recent 03/21/2020. Risk Factors:Hypertension.  Sonographer:    Eduard Roux Referring Phys: [id number] Daylene Katayama GHERGHE IMPRESSIONS  1. Left ventricular ejection fraction, by estimation, is 60 to 65%. The left ventricle has normal function. The left ventricle has no regional wall motion abnormalities. There is mild concentric left ventricular hypertrophy. Left ventricular diastolic parameters are consistent with Grade II diastolic dysfunction (pseudonormalization).  2. Right ventricular systolic function is normal. The right ventricular size is normal. There is normal pulmonary artery systolic pressure. The estimated right ventricular systolic pressure is 16.2 mmHg.  3. Left atrial size was mildly dilated.  4. The mitral valve is normal in structure. No evidence of mitral valve regurgitation. No evidence of mitral stenosis.  5. The aortic valve is tricuspid. Aortic valve regurgitation is not visualized. No aortic stenosis is present.  6. The inferior vena cava is normal in size with greater than 50% respiratory variability, suggesting right atrial pressure of 3 mmHg. FINDINGS  Left Ventricle: Left ventricular ejection fraction, by estimation, is 60 to 65%. The left ventricle has normal function. The left ventricle has no regional wall motion abnormalities. The left ventricular internal cavity size was normal in size. There is  mild concentric  left ventricular hypertrophy. Left ventricular diastolic parameters are consistent with Grade II diastolic dysfunction (pseudonormalization). Right Ventricle: The right ventricular size is normal. No increase in right ventricular wall thickness. Right ventricular systolic function is normal. There is normal pulmonary artery systolic pressure. The tricuspid regurgitant velocity is 1.82 m/s, and  with an assumed right atrial pressure of 3 mmHg, the estimated right ventricular systolic pressure is 16.2 mmHg. Left Atrium: Left atrial size was mildly dilated. Right Atrium: Right atrial size was normal in size. Pericardium: Trivial pericardial effusion is present. Mitral Valve: The mitral valve is normal in structure. No evidence of mitral valve regurgitation. No evidence of mitral valve stenosis. Tricuspid Valve: The tricuspid valve is normal in structure. Tricuspid valve regurgitation is trivial. Aortic Valve: The aortic valve is tricuspid. Aortic valve regurgitation is not visualized. No aortic stenosis is present. Aortic valve peak gradient measures 10.4 mmHg. Pulmonic Valve: The pulmonic valve was normal in structure. Pulmonic valve  regurgitation is trivial. Aorta: The aortic root is normal in size and structure. Venous: The inferior vena cava is normal in size with greater than 50% respiratory variability, suggesting right atrial pressure of 3 mmHg. IAS/Shunts: No atrial level shunt detected by color flow Doppler.  LEFT VENTRICLE PLAX 2D LVIDd:         4.30 cm   Diastology LVIDs:         2.90 cm   LV e' medial:    5.63 cm/s LV PW:         1.50 cm   LV E/e' medial:  17.8 LV IVS:        1.50 cm   LV e' lateral:   7.51 cm/s LVOT diam:     1.90 cm   LV E/e' lateral: 13.3 LV SV:         75 LV SV Index:   45 LVOT Area:     2.84 cm  RIGHT VENTRICLE             IVC RV Basal diam:  2.90 cm     IVC diam: 1.50 cm RV S prime:     13.00 cm/s TAPSE (M-mode): 2.5 cm LEFT ATRIUM             Index        RIGHT ATRIUM            Index LA diam:        3.10 cm 1.83 cm/m   RA Area:     15.40 cm LA Vol (A2C):   53.7 ml 31.74 ml/m  RA Volume:   38.90 ml  22.99 ml/m LA Vol (A4C):   62.7 ml 37.06 ml/m LA Biplane Vol: 59.3 ml 35.05 ml/m  AORTIC VALVE                 PULMONIC VALVE AV Area (Vmax): 2.40 cm     PV Vmax:       0.75 m/s AV Vmax:        161.00 cm/s  PV Peak grad:  2.2 mmHg AV Peak Grad:   10.4 mmHg LVOT Vmax:      136.00 cm/s LVOT Vmean:     99.100 cm/s LVOT VTI:       0.266 m  AORTA Ao Root diam: 3.40 cm Ao Asc diam:  3.30 cm MITRAL VALVE                TRICUSPID VALVE MV Area (PHT): 3.06 cm     TR Peak grad:   13.2 mmHg MV Decel Time: 248 msec     TR Vmax:        182.00 cm/s MV E velocity: 100.00 cm/s MV A velocity: 87.50 cm/s   SHUNTS MV E/A ratio:  1.14         Systemic VTI:  0.27 m                             Systemic Diam: 1.90 cm Dalton McleanMD Electronically signed by Wilfred Lacy Signature Date/Time: 05/06/2022/5:45:29 PM    Final    MR BRAIN WO CONTRAST  Result Date: 05/06/2022 CLINICAL DATA:  Acute neuro deficit. EXAM: MRI HEAD WITHOUT CONTRAST TECHNIQUE: Multiplanar, multiecho pulse sequences of the brain and surrounding structures were obtained without intravenous contrast. COMPARISON:  CT head 05/06/2022.  MRI head 03/20/2020 FINDINGS: Brain: Multiple areas of restricted diffusion compatible with acute infarct. These are all small  areas of infarct involving the right pons, left putamen, right thalamus, corpus callosum, and the left frontal white matter. Chronic infarcts in the pons especially on the right. Chronic lacunar infarcts in the thalamus bilaterally. Chronic infarcts in the basal ganglia bilaterally. Multiple foci of chronic hemorrhage involving the pons. Chronic microhemorrhage in the right thalamus and right putamen. No hydrocephalus or mass lesion. Vascular: Normal arterial flow voids Skull and upper cervical spine: Negative Sinuses/Orbits: Mild mucosal edema paranasal sinuses. Negative orbit  Other: None IMPRESSION: Numerous small areas of acute infarct as described above. Correlate with recent history of hypertension or hypotension. Cerebral emboli possible however no cortical infarct is seen as would be expected with emboli. Advanced for age chronic microvascular ischemia and multiple areas of chronic microhemorrhage. Correlate with risk factors for small vessel disease. Electronically Signed   By: Marlan Palau M.D.   On: 05/06/2022 14:51   CT HEAD WO CONTRAST ( )  Result Date: 05/06/2022 CLINICAL DATA:  Patient is unable to move the right foot this morning. 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 RADIATION DOSE REDUCTION: This exam was performed according to the departmental dose-optimization program which includes automated exposure control, adjustment of the mA and/or kV according to patient size and/or use of iterative reconstruction technique. COMPARISON:  CT brain 03/20/2020, MRI head 03/20/2020 FINDINGS: Brain: Chronic left thalamic and left lentiform nucleus lacunar infarcts. There is also a chronic infarct in the right thalamus. Compared to prior exams there is an age indeterminate hypodensity in the left caudate head (series 2, image 15). No evidence of hydrocephalus. No hemorrhage. No extra-axial fluid collections. Vascular: No hyperdense vessel or unexpected calcification. Skull: Normal. Negative for fracture or focal lesion. Sinuses/Orbits: No acute finding. Other: None. IMPRESSION: Compared to prior exam there is a new hypodensity in the left caudate head. Although this has a chronic appearance, it was not visualized on prior imaging and is technically age indeterminate. If there is clinical concern for an acute infarct, this could be further assessed with a brain MRI. Electronically Signed   By: Lorenza Cambridge M.D.   On:  05/06/2022 12:01   CT Angio Abd/Pel w/ and/or w/o  Result Date: 05/04/2022 CLINICAL DATA:  Mid and lower abdominal pain. Evaluate for acute mesenteric ischemia. EXAM: CTA ABDOMEN AND PELVIS WITHOUT AND WITH CONTRAST TECHNIQUE: Multidetector CT imaging of the abdomen and pelvis was performed using the standard protocol during bolus administration of intravenous contrast. Multiplanar reconstructed images and MIPs were obtained and reviewed to evaluate the vascular anatomy. RADIATION DOSE REDUCTION: This exam was performed according to the departmental dose-optimization program which includes automated exposure control, adjustment of the mA and/or kV according to patient size and/or use of iterative reconstruction technique. CONTRAST:  OMNIPAQUE IOHEXOL 350 MG/ML SOLN COMPARISON:  CT abdomen and pelvis  05/01/2022 and 08/04/2018 FINDINGS: VASCULAR Aorta: Normal caliber aorta without aneurysm, dissection, vasculitis or significant stenosis. Celiac: Patent without evidence of aneurysm, dissection, vasculitis or significant stenosis. Variant anatomy with only 2 main branches coming off the celiac trunk. The 2 main branches are the left gastric artery and the splenic artery. Common hepatic artery is coming off the left gastric artery in the expected location of an accessory left gastric artery. There is a small gastroduodenal artery. SMA: Patent without evidence of aneurysm, dissection, vasculitis or significant stenosis. Renals: Both renal arteries are patent  without evidence of aneurysm, dissection, vasculitis, fibromuscular dysplasia or significant stenosis. IMA: Patent without evidence of aneurysm, dissection, vasculitis or significant stenosis. Inflow: Patent without evidence of aneurysm, dissection, vasculitis or significant stenosis. Proximal Outflow: Bilateral common femoral and visualized portions of the superficial and profunda femoral arteries are patent without evidence of aneurysm, dissection,  vasculitis or significant stenosis. Veins: Portal veins and main mesenteric veins are patent without filling defects. IVC and renal veins are patent. No gross abnormality to the iliac veins. Review of the MIP images confirms the above findings. NON-VASCULAR Lower chest: Mild dependent changes in the posterior right lower lobe. Otherwise, the lung bases are clear. Hepatobiliary: 8 mm hypodensity in the anterior liver is probably an incidental finding. Gallbladder is decompressed. No biliary dilatation. Pancreas: Unremarkable. No pancreatic ductal dilatation or surrounding inflammatory changes. Spleen: Normal in size without focal abnormality. Adrenals/Urinary Tract: Normal adrenal glands. Normal appearance of both kidneys hydronephrosis. 6 mm hypodensity in the right kidney lower pole on sequence 4, image 114 is too small to definitively characterize but likely represents a cyst. This structure does not require dedicated follow-up. No suspicious renal lesions. Mild wall thickening in the urinary bladder. Stomach/Bowel: Normal appearance of the stomach. Question a small hiatal hernia. New fluid and inflammatory changes in the lower abdomen and in the pelvis. Inflammatory changes are centered around uterus and adnexal structures and near loops of small bowel in the lower abdomen. Mildly dilated loops of small bowel in the lower abdomen, largest measuring up to 3.3 cm. Inflammatory changes are also the near the sigmoid colon. Appendix is not confidently identified. Lymphatic: Mildly prominent retroperitoneal lymph nodes. Index lymph node between the aorta and IVC on sequence 11 image 100 measures 1.2 cm in the short axis and this lymph node measured approximately 0.7 cm on 08/04/2018. No significant lymph node enlargement in the pelvis. Reproductive: Fluid and stranding throughout the pelvis around the uterus and adnexal structures. No discrete adnexal lesion. Other: There is extensive stranding with a small amount of  fluid throughout the lower abdomen and pelvis. Evidence for developing fluid collections in the pelvis. A collection along the left side of the pelvis on sequence 11 image 194 measures 4.8 x 2.7 cm. Evidence for small complex fluid collections in the pre rectal region on image 194. There is additional complex fluid between the uterus and right adnexal structures on image 172. Musculoskeletal: No acute bone abnormality. IMPRESSION: VASCULAR 1. Arterial structures are widely patent without significant atherosclerotic disease. No evidence for arterial stenosis. 2. Variant celiac artery anatomy as described. NON-VASCULAR 1. 1. New fluid and inflammatory changes in the lower abdomen and pelvis. These inflammatory changes are near the uterus, adnexa and loops of bowel. Inflammation source is unclear. Differential diagnosis includes enteritis, colitis and possibly pelvic inflammatory disease. Dilatation of some small bowel loops suggests that small bowel could be the inflammatory source. 2. New small complex fluid collections in the pelvis. Early or developing small abscess collections cannot be excluded. Recommend short-term follow-up CT to evaluate these fluid collections. 3. Slightly enlarged retroperitoneal lymph nodes as described. Findings are nonspecific but could be reactive. Recommend attention to these on follow-up imaging. 4. Mild wall thickening in the urinary bladder is nonspecific due to incomplete distension and could be also be related to surrounding inflammatory changes. Electronically Signed   By: Richarda Overlie M.D.   On: 05/04/2022 14:19   US Pelvis Complete  Result Date: 05/01/2022 CLINICAL DATA:  Pelvic pain EXAM: TRANSABDOMINAL AND TRANSVAGINAL ULTRASOUND  OF PELVIS DOPPLER ULTRASOUND OF OVARIES TECHNIQUE: Both transabdominal and transvaginal ultrasound examinations of the pelvis were performed. Transabdominal technique was performed for global imaging of the pelvis including uterus, ovaries, adnexal  regions, and pelvic cul-de-sac. It was necessary to proceed with endovaginal exam following the transabdominal exam to visualize the endometrium and ovaries. Color and duplex Doppler ultrasound was utilized to evaluate blood flow to the ovaries. COMPARISON:  CT abdomen 05/01/2022 FINDINGS: Uterus Measurements: 9.4 x 5 x 5.8 cm = volume: 140.8 mL. No fibroids or other mass visualized. Endometrium Thickness: 10 mm.  No focal abnormality visualized. Right ovary Measurements: 3.3 x 2.3 x 3.1 cm = volume: 12 mL. Normal appearance/no adnexal mass. Left ovary Measurements: 3 x 2.5 x 3.1 cm = volume: 12 mL. Normal appearance/no adnexal mass. Pulsed Doppler evaluation of both ovaries demonstrates normal low-resistance arterial and venous waveforms. Other findings No abnormal free fluid. IMPRESSION: 1. Normal pelvic ultrasound. 2. No sonographic findings to explain the patient's pelvic pain. Electronically Signed   By: Elige Ko M.D.   On: 05/01/2022 14:30   US Transvaginal Non-OB  Result Date: 05/01/2022 CLINICAL DATA:  Pelvic pain EXAM: TRANSABDOMINAL AND TRANSVAGINAL ULTRASOUND OF PELVIS DOPPLER ULTRASOUND OF OVARIES TECHNIQUE: Both transabdominal and transvaginal ultrasound examinations of the pelvis were performed. Transabdominal technique was performed for global imaging of the pelvis including uterus, ovaries, adnexal regions, and pelvic cul-de-sac. It was necessary to proceed with endovaginal exam following the transabdominal exam to visualize the endometrium and ovaries. Color and duplex Doppler ultrasound was utilized to evaluate blood flow to the ovaries. COMPARISON:  CT abdomen 05/01/2022 FINDINGS: Uterus Measurements: 9.4 x 5 x 5.8 cm = volume: 140.8 mL. No fibroids or other mass visualized. Endometrium Thickness: 10 mm.  No focal abnormality visualized. Right ovary Measurements: 3.3 x 2.3 x 3.1 cm = volume: 12 mL. Normal appearance/no adnexal mass. Left ovary Measurements: 3 x 2.5 x 3.1 cm = volume: 12 mL.  Normal appearance/no adnexal mass. Pulsed Doppler evaluation of both ovaries demonstrates normal low-resistance arterial and venous waveforms. Other findings No abnormal free fluid. IMPRESSION: 1. Normal pelvic ultrasound. 2. No sonographic findings to explain the patient's pelvic pain. Electronically Signed   By: Elige Ko M.D.   On: 05/01/2022 14:30   [place] Art/Ven Flow Abd Pelv Doppler  Result Date: 05/01/2022 CLINICAL DATA:  Pelvic pain EXAM: TRANSABDOMINAL AND TRANSVAGINAL ULTRASOUND OF PELVIS DOPPLER ULTRASOUND OF OVARIES TECHNIQUE: Both transabdominal and transvaginal ultrasound examinations of the pelvis were performed. Transabdominal technique was performed for global imaging of the pelvis including uterus, ovaries, adnexal regions, and pelvic cul-de-sac. It was necessary to proceed with endovaginal exam following the transabdominal exam to visualize the endometrium and ovaries. Color and duplex Doppler ultrasound was utilized to evaluate blood flow to the ovaries. COMPARISON:  CT abdomen 05/01/2022 FINDINGS: Uterus Measurements: 9.4 x 5 x 5.8 cm = volume: 140.8 mL. No fibroids or other mass visualized. Endometrium Thickness: 10 mm.  No focal abnormality visualized. Right ovary Measurements: 3.3 x 2.3 x 3.1 cm = volume: 12 mL. Normal appearance/no adnexal mass. Left ovary Measurements: 3 x 2.5 x 3.1 cm = volume: 12 mL. Normal appearance/no adnexal mass. Pulsed Doppler evaluation of both ovaries demonstrates normal low-resistance arterial and venous waveforms. Other findings No abnormal free fluid. IMPRESSION: 1. Normal pelvic ultrasound. 2. No sonographic findings to explain the patient's pelvic pain. Electronically Signed   By: Elige Ko M.D.   On: 05/01/2022 14:30   CT Abdomen Pelvis Wo Contrast  Result Date: 05/01/2022 CLINICAL DATA:  Three days of lower abdominal pain. Positive hematuria. Prior history of tubal ligation. EXAM: CT ABDOMEN AND PELVIS WITHOUT CONTRAST TECHNIQUE: Multidetector CT  imaging of the abdomen and pelvis was performed following the standard protocol without IV contrast. RADIATION DOSE REDUCTION: This exam was performed according to the departmental dose-optimization program which includes automated exposure control, adjustment of the mA and/or kV according to patient size and/or use of iterative reconstruction technique. COMPARISON:  August 04, 2018 FINDINGS: Lower chest: No acute abnormality. Hepatobiliary: Diffuse hepatic steatosis. Subtle hypodensity along the anterior aspect of the liver measures 7 mm on image 14/2 and while technically too small to accurately characterize but is stable dating back to August 04, 2018 consistent with a benign finding. Cholelithiasis without findings of acute cholecystitis. No biliary ductal dilation. Pancreas: No pancreatic ductal dilation or evidence of acute inflammation. Spleen: No splenomegaly. Adrenals/Urinary Tract: Mild thickening of the bilateral adrenal glands without discrete nodularity, favor hyperplasia. No hydronephrosis. No renal, ureteral or bladder calculi. Urinary bladder is unremarkable for degree of distension. Stomach/Bowel: Hyperdense material in the colon traversing the rectum likely reflects ingested antidiarrheal agent. Stomach is unremarkable for degree of distension. No pathologic dilation of small or large bowel. The appendix appears normal. Colon is predominately decompressed limiting evaluation. Fibrofatty infiltration of the terminal ileum and colon likely reflect sequela of chronic inflammation. No evidence of acute bowel inflammation. Vascular/Lymphatic: Normal caliber abdominal aorta. Prominent retroperitoneal lymph nodes are similar dating back to August 04, 2018 consistent with a benign/indolent process. Reproductive: Uterus and bilateral adnexa are unremarkable in noncontrast CT appearance for reproductive age female. Other: Trace pelvic free fluid is within physiologic normal limits. Musculoskeletal: No  acute osseous abnormality. IMPRESSION: 1. No hydronephrosis or evidence of obstructive uropathy. 2. Hepatic steatosis. 3. Cholelithiasis without findings of acute cholecystitis. 4. Fibrofatty infiltration of the terminal ileum and colon as can be seen with chronic inflammation. No evidence of acute bowel inflammation. Electronically Signed   By: Maudry Mayhew M.D.   On: 05/01/2022 12:59     Assessment & Plan:   Hypertension Note: Patient refuses to go back to the ED - advised to go with any worsening symptoms  1. Primary hypertension  - cloNIDine (CATAPRES) tablet 0.1 mg  2. PID (acute pelvic inflammatory disease)  - Ambulatory referral to Obstetrics / Gynecology  3. Abnormal CT scan  - Ambulatory referral to Gastroenterology  4. Generalized abdominal pain  - Ambulatory referral to Gastroenterology  5. Acute CVA (cerebrovascular accident) [facility])  - Ambulatory referral to Neurology   Follow up:  Follow up in 3 months or sooner - please go to the ED with any worsening symptoms      Ivonne Andrew, NP 05/28/2022

## 2022-05-29 ENCOUNTER — Emergency Department (HOSPITAL_COMMUNITY): Payer: Self-pay

## 2022-05-29 ENCOUNTER — Other Ambulatory Visit (HOSPITAL_COMMUNITY): Payer: Self-pay

## 2022-05-29 ENCOUNTER — Other Ambulatory Visit: Payer: Self-pay

## 2022-05-29 ENCOUNTER — Encounter (HOSPITAL_COMMUNITY): Payer: Self-pay

## 2022-05-29 DIAGNOSIS — F1410 Cocaine abuse, uncomplicated: Secondary | ICD-10-CM

## 2022-05-29 DIAGNOSIS — E876 Hypokalemia: Secondary | ICD-10-CM

## 2022-05-29 DIAGNOSIS — I10 Essential (primary) hypertension: Secondary | ICD-10-CM

## 2022-05-29 DIAGNOSIS — F1020 Alcohol dependence, uncomplicated: Secondary | ICD-10-CM

## 2022-05-29 DIAGNOSIS — N730 Acute parametritis and pelvic cellulitis: Secondary | ICD-10-CM

## 2022-05-29 DIAGNOSIS — E785 Hyperlipidemia, unspecified: Secondary | ICD-10-CM

## 2022-05-29 DIAGNOSIS — Z720 Tobacco use: Secondary | ICD-10-CM

## 2022-05-29 DIAGNOSIS — R1030 Lower abdominal pain, unspecified: Secondary | ICD-10-CM

## 2022-05-29 LAB — CBC WITH DIFFERENTIAL/PLATELET
Abs Immature Granulocytes: 0.03 10*3/uL (ref 0.00–0.07)
Basophils Absolute: 0 10*3/uL (ref 0.0–0.1)
Basophils Relative: 0 %
Eosinophils Absolute: 0.1 10*3/uL (ref 0.0–0.5)
Eosinophils Relative: 1 %
HCT: 39.3 % (ref 36.0–46.0)
Hemoglobin: 12.8 g/dL (ref 12.0–15.0)
Immature Granulocytes: 0 %
Lymphocytes Relative: 16 %
Lymphs Abs: 1.6 10*3/uL (ref 0.7–4.0)
MCH: 30 pg (ref 26.0–34.0)
MCHC: 32.6 g/dL (ref 30.0–36.0)
MCV: 92 fL (ref 80.0–100.0)
Monocytes Absolute: 0.8 10*3/uL (ref 0.1–1.0)
Monocytes Relative: 8 %
Neutro Abs: 7.6 10*3/uL (ref 1.7–7.7)
Neutrophils Relative %: 75 %
Platelets: 230 10*3/uL (ref 150–400)
RBC: 4.27 MIL/uL (ref 3.87–5.11)
RDW: 15.6 % — ABNORMAL HIGH (ref 11.5–15.5)
WBC: 10.1 10*3/uL (ref 4.0–10.5)
nRBC: 0 % (ref 0.0–0.2)

## 2022-05-29 LAB — COMPREHENSIVE METABOLIC PANEL
ALT: 28 U/L (ref 0–44)
AST: 40 U/L (ref 15–41)
Albumin: 4.1 g/dL (ref 3.5–5.0)
Alkaline Phosphatase: 89 U/L (ref 38–126)
Anion gap: 13 (ref 5–15)
BUN: 16 mg/dL (ref 6–20)
CO2: 17 mmol/L — ABNORMAL LOW (ref 22–32)
Calcium: 9.1 mg/dL (ref 8.9–10.3)
Chloride: 105 mmol/L (ref 98–111)
Creatinine, Ser: 0.61 mg/dL (ref 0.44–1.00)
GFR, Estimated: >60 mL/min (ref >60)
Glucose, Bld: 117 mg/dL — ABNORMAL HIGH (ref 70–99)
Potassium: 3.2 mmol/L — ABNORMAL LOW (ref 3.5–5.1)
Sodium: 135 mmol/L (ref 135–145)
Total Bilirubin: 0.4 mg/dL (ref 0.3–1.2)
Total Protein: 7.7 g/dL (ref 6.5–8.1)

## 2022-05-29 LAB — RAPID URINE DRUG SCREEN, HOSP PERFORMED
Amphetamines: NOT DETECTED
Barbiturates: NOT DETECTED
Benzodiazepines: POSITIVE — AB
Cocaine: POSITIVE — AB
Opiates: NOT DETECTED
Tetrahydrocannabinol: NOT DETECTED

## 2022-05-29 LAB — URINALYSIS, ROUTINE W REFLEX MICROSCOPIC
Bilirubin Urine: NEGATIVE
Glucose, UA: NEGATIVE mg/dL
Hgb urine dipstick: NEGATIVE
Ketones, ur: 5 mg/dL — AB
Leukocytes,Ua: NEGATIVE
Nitrite: NEGATIVE
Protein, ur: NEGATIVE mg/dL
Specific Gravity, Urine: <1.005 — ABNORMAL LOW (ref 1.005–1.030)
pH: 6 (ref 5.0–8.0)

## 2022-05-29 LAB — LIPASE, BLOOD: Lipase: 36 U/L (ref 11–51)

## 2022-05-29 LAB — LACTIC ACID, PLASMA
Lactic Acid, Venous: 2.2 mmol/L (ref 0.5–1.9)
Lactic Acid, Venous: 1.6 mmol/L (ref 0.5–1.9)

## 2022-05-29 LAB — I-STAT BETA HCG BLOOD, ED (MC, WL, AP ONLY): I-stat hCG, quantitative: <5 m[IU]/mL (ref <5)

## 2022-05-29 LAB — MAGNESIUM: Magnesium: 1.9 mg/dL (ref 1.7–2.4)

## 2022-05-29 LAB — ETHANOL: Alcohol, Ethyl (B): 39 mg/dL — ABNORMAL HIGH (ref <10)

## 2022-05-29 MED ORDER — CHLORHEXIDINE GLUCONATE CLOTH 2 % EX PADS
6.0000 | MEDICATED_PAD | Freq: Every day | CUTANEOUS | Status: DC
  Administered 2022-05-29 – 2022-05-31 (×3): 6 via TOPICAL

## 2022-05-29 MED ORDER — LORAZEPAM 1 MG PO TABS: 1.0000 mg | ORAL_TABLET | ORAL | Status: DC | PRN

## 2022-05-29 MED ORDER — NICOTINE 21 MG/24HR TD PT24
21.0000 mg | MEDICATED_PATCH | Freq: Every day | TRANSDERMAL | Status: DC
  Administered 2022-05-29 – 2022-05-31 (×3): 21 mg via TRANSDERMAL
  Filled 2022-05-29 (×3): qty 1

## 2022-05-29 MED ORDER — SODIUM CHLORIDE 0.9% FLUSH: 10.0000 mL | INTRAVENOUS | Status: DC | PRN

## 2022-05-29 MED ORDER — CLOPIDOGREL BISULFATE 75 MG PO TABS
75.0000 mg | ORAL_TABLET | Freq: Every day | ORAL | Status: DC
  Administered 2022-05-29 – 2022-05-31 (×3): 75 mg via ORAL
  Filled 2022-05-29 (×3): qty 1

## 2022-05-29 MED ORDER — METRONIDAZOLE 500 MG PO TABS
500.0000 mg | ORAL_TABLET | Freq: Two times a day (BID) | ORAL | Status: DC
  Administered 2022-05-29 – 2022-05-31 (×5): 500 mg via ORAL
  Filled 2022-05-29 (×5): qty 1

## 2022-05-29 MED ORDER — HYDROMORPHONE HCL 1 MG/ML IJ SOLN
1.0000 mg | Freq: Once | INTRAMUSCULAR | Status: AC
  Administered 2022-05-29: 1 mg via INTRAVENOUS
  Filled 2022-05-29: qty 1

## 2022-05-29 MED ORDER — IOHEXOL 300 MG/ML  SOLN
100.0000 mL | Freq: Once | INTRAMUSCULAR | Status: AC | PRN
  Administered 2022-05-29: 100 mL via INTRAVENOUS

## 2022-05-29 MED ORDER — ATORVASTATIN CALCIUM 40 MG PO TABS
40.0000 mg | ORAL_TABLET | Freq: Every day | ORAL | Status: DC
  Administered 2022-05-29 – 2022-05-31 (×3): 40 mg via ORAL
  Filled 2022-05-29 (×3): qty 1

## 2022-05-29 MED ORDER — MAGNESIUM SULFATE 2 GM/50ML IV SOLN
2.0000 g | Freq: Once | INTRAVENOUS | Status: AC
  Administered 2022-05-29: 2 g via INTRAVENOUS
  Filled 2022-05-29: qty 50

## 2022-05-29 MED ORDER — NALOXONE HCL 0.4 MG/ML IJ SOLN: 0.4000 mg | INTRAMUSCULAR | Status: DC | PRN

## 2022-05-29 MED ORDER — SODIUM CHLORIDE 0.9 % IV SOLN: INTRAVENOUS | Status: AC

## 2022-05-29 MED ORDER — LORAZEPAM 2 MG/ML IJ SOLN
1.0000 mg | INTRAMUSCULAR | Status: DC | PRN
  Administered 2022-05-29: 1 mg via INTRAVENOUS
  Filled 2022-05-29: qty 1

## 2022-05-29 MED ORDER — AMLODIPINE BESYLATE 5 MG PO TABS
5.0000 mg | ORAL_TABLET | Freq: Every day | ORAL | Status: DC
  Administered 2022-05-29 – 2022-05-30 (×2): 5 mg via ORAL
  Filled 2022-05-29 (×2): qty 1

## 2022-05-29 MED ORDER — HYDROMORPHONE HCL 1 MG/ML IJ SOLN
1.0000 mg | Freq: Once | INTRAMUSCULAR | Status: AC
  Administered 2022-05-29: 1 mg via INTRAVENOUS

## 2022-05-29 MED ORDER — INFLUENZA VAC SPLIT QUAD 0.5 ML IM SUSY
0.5000 mL | PREFILLED_SYRINGE | INTRAMUSCULAR | Status: AC
  Administered 2022-05-30: 0.5 mL via INTRAMUSCULAR
  Filled 2022-05-29: qty 0.5

## 2022-05-29 MED ORDER — ONDANSETRON HCL 4 MG/2ML IJ SOLN: 4.0000 mg | Freq: Four times a day (QID) | INTRAMUSCULAR | Status: DC | PRN

## 2022-05-29 MED ORDER — ONDANSETRON HCL 4 MG/2ML IJ SOLN
4.0000 mg | Freq: Once | INTRAMUSCULAR | Status: AC
  Administered 2022-05-29: 4 mg via INTRAVENOUS
  Filled 2022-05-29: qty 2

## 2022-05-29 MED ORDER — CIPROFLOXACIN HCL 500 MG PO TABS
500.0000 mg | ORAL_TABLET | Freq: Two times a day (BID) | ORAL | Status: DC
  Administered 2022-05-29 – 2022-05-31 (×5): 500 mg via ORAL
  Filled 2022-05-29 (×5): qty 1

## 2022-05-29 MED ORDER — POTASSIUM CHLORIDE CRYS ER 20 MEQ PO TBCR
40.0000 meq | EXTENDED_RELEASE_TABLET | ORAL | Status: AC
  Administered 2022-05-29 (×2): 40 meq via ORAL
  Filled 2022-05-29 (×2): qty 2

## 2022-05-29 MED ORDER — SODIUM CHLORIDE 0.9% FLUSH
10.0000 mL | Freq: Two times a day (BID) | INTRAVENOUS | Status: DC
  Administered 2022-05-29 – 2022-05-30 (×2): 10 mL

## 2022-05-29 MED ORDER — FENTANYL CITRATE PF 50 MCG/ML IJ SOSY
50.0000 ug | PREFILLED_SYRINGE | Freq: Once | INTRAMUSCULAR | Status: AC
  Administered 2022-05-29: 50 ug via INTRAVENOUS
  Filled 2022-05-29: qty 1

## 2022-05-29 MED ORDER — LACTATED RINGERS IV BOLUS
1000.0000 mL | Freq: Once | INTRAVENOUS | Status: AC
  Administered 2022-05-29: 1000 mL via INTRAVENOUS

## 2022-05-29 MED ORDER — HYDROMORPHONE HCL 1 MG/ML IJ SOLN
0.5000 mg | INTRAMUSCULAR | Status: DC | PRN
  Administered 2022-05-29 – 2022-05-30 (×8): 0.5 mg via INTRAVENOUS
  Filled 2022-05-29 (×7): qty 0.5
  Filled 2022-05-29: qty 1

## 2022-05-29 MED ORDER — SODIUM CHLORIDE (PF) 0.9 % IJ SOLN
INTRAMUSCULAR | Status: AC
  Filled 2022-05-29: qty 50

## 2022-05-29 MED ORDER — DOXYCYCLINE HYCLATE 100 MG PO TABS
100.0000 mg | ORAL_TABLET | Freq: Two times a day (BID) | ORAL | Status: DC
  Administered 2022-05-29 – 2022-05-31 (×5): 100 mg via ORAL
  Filled 2022-05-29 (×5): qty 1

## 2022-05-29 NOTE — ED Provider Notes (Signed)
[place] DEPT Provider Note   CSN: [id number] Arrival date & time: 05/28/22  2307     History  Chief Complaint  Patient presents with   Drug Overdose    [name] is a 36 y.o. female.  36 year old female presents today for evaluation following drug overdose requiring Narcan.  She was recently admitted for pelvic abscess and recently discharged.  She had a routine hospital follow-up today with her PCP.  She reports since being discharged she has had significant pelvic pain, nausea and vomiting.  She was encouraged by her PCP to return to the hospital for evaluation.  She states she did come to the hospital and saw the waiting room and instead left.  She states she just wanted to be numb and get rid of the pain so she used cocaine and believes it was laced with something She remembers this being in the hospital.  She also endorses alcohol abuse.  Most recent drink around 7 PM.  However she states she only has a few drinks per week now.  Reports prior history of heavy alcohol abuse.  The history is provided by the patient. No language interpreter was used.       Home Medications Prior to Admission medications   Medication Sig Start Date End Date Taking? Authorizing Provider  aspirin EC 81 MG tablet Take 1 tablet (81 mg total) by mouth daily. Swallow whole. 05/14/22   Antonieta Pert, MD  atorvastatin (LIPITOR) 40 MG tablet Take 1 tablet (40 mg total) by mouth daily. 05/15/22   Antonieta Pert, MD  ciprofloxacin (CIPRO) 500 MG tablet Take 1 tablet (500 mg total) by mouth 2 (two) times daily for 14 days. 05/15/22 06/01/22  Antonieta Pert, MD  clopidogrel (PLAVIX) 75 MG tablet Take 1 tablet (75 mg total) by mouth daily. 05/15/22   Antonieta Pert, MD  meloxicam (MOBIC) 7.5 MG tablet Take 1 tablet (7.5 mg total) by mouth daily. 05/28/22   Fenton Foy, NP  metroNIDAZOLE (FLAGYL) 500 MG tablet Take 1 tablet (500 mg total) by mouth 2 (two) times daily for 14 days. 05/15/22  06/01/22  Antonieta Pert, MD      Allergies    Ibuprofen, Latex, Peanut-containing drug products, Penicillins, Acetaminophen, Codeine, Lisinopril, and Ativan [lorazepam]    Review of Systems   Review of Systems  Constitutional:  Negative for chills and fever.  Respiratory:  Negative for shortness of breath.   Cardiovascular:  Negative for chest pain.  Gastrointestinal:  Positive for abdominal pain, nausea and vomiting.  Genitourinary:  Negative for dysuria.  All other systems reviewed and are negative.   Physical Exam Updated Vital Signs BP (!) 146/96   Pulse 99   Temp 97.6 F (36.4 C) (Oral)   Resp 11   LMP 05/01/2022 (Approximate) Comment: negative hcg blood test 05-01-2022  SpO2 94%  Physical Exam Vitals and nursing note reviewed.  Constitutional:      General: She is not in acute distress.    Appearance: Normal appearance. She is not ill-appearing.  HENT:     Head: Normocephalic and atraumatic.     Nose: Nose normal.  Eyes:     Conjunctiva/sclera: Conjunctivae normal.  Cardiovascular:     Rate and Rhythm: Regular rhythm. Tachycardia present.     Pulses: Normal pulses.  Pulmonary:     Effort: Pulmonary effort is normal. No respiratory distress.  Abdominal:     General: There is no distension.     Palpations: Abdomen is  soft.     Tenderness: There is abdominal tenderness. There is guarding. There is no right CVA tenderness or left CVA tenderness.  Musculoskeletal:        General: No deformity. Normal range of motion.     Cervical back: Normal range of motion.  Skin:    Findings: No rash.  Neurological:     Mental Status: She is alert.     ED Results / Procedures / Treatments   Labs (all labs ordered are listed, but only abnormal results are displayed) Labs Reviewed  CBC WITH DIFFERENTIAL/PLATELET  COMPREHENSIVE METABOLIC PANEL  LIPASE, BLOOD  LACTIC ACID, PLASMA  LACTIC ACID, PLASMA  MAGNESIUM  URINALYSIS, ROUTINE W REFLEX MICROSCOPIC  RAPID URINE DRUG  SCREEN, HOSP PERFORMED  I-STAT BETA HCG BLOOD, ED ([facility], [facility], [facility] ONLY)    EKG None  Radiology No results found.  Procedures Procedures    Medications Ordered in ED Medications - No data to display  ED Course/ Medical Decision Making/ A&P Clinical Course as of 05/29/22 0321  [facility] May 29, 2022  0354 CT abdomen pelvis without acute intra-abdominal pathology.  It does show improvement in previously noted inflammatory changes.  It does show right adnexal cystic lesion.  Patient's pain is worse in the right lower quadrant.  Will obtain pelvic ultrasound to further evaluate.  Patient reports her pain has worsened since earlier.  Will provide additional medication, await pelvic ultrasound and then reevaluate.  Patient also has lactic acidosis of 2.2.  Fluids infusing.  Will repeat.  Ethanol level of 39.  Mild hypokalemia at 3.2.  Will order IV repletion.  Without leukocytosis or anemia. [AA]  0602 She despite multiple doses of pain medication still reports significant pain.  Lactic acidosis has resolved.  Ultrasound without any concerning finding such as torsion.  Given ongoing pain and poor control so far will discuss with hospitalist for admission. [AA]    Clinical Course User Index [AA] Marita Kansas, PA-C                           Medical Decision Making Amount and/or Complexity of Data Reviewed Labs: ordered. Radiology: ordered.  Risk Prescription drug management. Decision regarding hospitalization.   Medical Decision Making / ED Course   This patient presents to the ED for concern of drug overdose, abdominal pain, this involves an extensive number of treatment options, and is a complaint that carries with it a high risk of complications and morbidity.  The differential diagnosis includes overdose, recurrent pelvic infection, appendicitis, UTI, pyelonephritis, ovarian torsion  MDM: 36 year old female presents with above-mentioned complaint.  Patient was found unresponsive and  required Narcan.  Since arrival to the emergency room she has remained alert and oriented.  Is complaining of severe right lower quadrant abdominal pain.  She was recently admitted and had a pelvic abscess which was drained.  Patient ultimately left AMA on 9/20.  She returned for hospital follow-up today and was requested to come to the emergency room for evaluation.  Work-up reveals CBC without leukocytosis or anemia.  CMP with potassium 3.2, preserved renal function.  UDS positive for cocaine and benzo.  Initial lactic acidosis present at 2.2, ethanol level of 39, lipase 36.  Following fluid hydration lactic acidosis resolved.  Magnesium of 1.9.  CT abdomen pelvis showed improved inflammatory changes.  This was followed up by pelvic ultrasound given the cystic lesion in the right adnexa.  This was a complex cyst consistent  with follicular cyst no evidence of torsion or other acute pathology.  Patient required multiple rounds of pain medicine without significant control.  Will discuss with hospitalist for admission for ongoing pain control.  Patient states she was compliant with most of her antibiotics that she was discharged with except the past 2 days.  Patient discussed with hospitalist who will evaluate patient for admission.  Additional history obtained: -Additional history obtained from recent admission -External records from outside source obtained and reviewed including: Chart review including previous notes, labs, imaging, consultation notes   Lab Tests: -I ordered, reviewed, and interpreted labs.   The pertinent results include:   Labs Reviewed  CBC WITH DIFFERENTIAL/PLATELET - Abnormal; Notable for the following components:      Result Value   RDW 15.6 (*)    All other components within normal limits  COMPREHENSIVE METABOLIC PANEL - Abnormal; Notable for the following components:   Potassium 3.2 (*)    CO2 17 (*)    Glucose, Bld 117 (*)    All other components within normal limits   LACTIC ACID, PLASMA - Abnormal; Notable for the following components:   Lactic Acid, Venous 2.2 (*)    All other components within normal limits  RAPID URINE DRUG SCREEN, HOSP PERFORMED - Abnormal; Notable for the following components:   Cocaine POSITIVE (*)    Benzodiazepines POSITIVE (*)    All other components within normal limits  ETHANOL - Abnormal; Notable for the following components:   Alcohol, Ethyl (B) 39 (*)    All other components within normal limits  LIPASE, BLOOD  LACTIC ACID, PLASMA  MAGNESIUM  URINALYSIS, ROUTINE W REFLEX MICROSCOPIC  I-STAT BETA HCG BLOOD, ED ([facility], [facility], [facility] ONLY)      EKG  EKG Interpretation  Date/Time:    Ventricular Rate:    PR Interval:    QRS Duration:   QT Interval:    QTC Calculation:   R Axis:     Text Interpretation:           Imaging Studies ordered: I ordered imaging studies including CT abdomen pelvis, pelvic ultrasound I independently visualized and interpreted imaging. I agree with the radiologist interpretation   Medicines ordered and prescription drug management: Meds ordered this encounter  Medications   OR Linked Order Group    LORazepam (ATIVAN) tablet 1-4 mg     Order Specific Question:   CIWA-AR < 5 =     Answer:   0 mg     Order Specific Question:   CIWA-AR 5 -10 =     Answer:   1 mg     Order Specific Question:   CIWA-AR 11 -15 =     Answer:   2 mg     Order Specific Question:   CIWA-AR 16 -20 =     Answer:   3 mg     Order Specific Question:   CIWA-AR 16 -20 =     Answer:   Recheck CIWA-AR in 1 hour; if > 20 notify MD     Order Specific Question:   CIWA-AR > 20 =     Answer:   4 mg     Order Specific Question:   CIWA-AR > 20 =     Answer:   Call Rapid Response    LORazepam (ATIVAN) injection 1-4 mg     Order Specific Question:   CIWA-AR < 5 =     Answer:   0 mg  Order Specific Question:   CIWA-AR 5 -10 =     Answer:   1 mg     Order Specific Question:   CIWA-AR 11 -15 =     Answer:   2 mg      Order Specific Question:   CIWA-AR 16 -20 =     Answer:   3 mg     Order Specific Question:   CIWA-AR 16 -20 =     Answer:   Recheck CIWA-AR in 1 hour; if > 20 notify MD     Order Specific Question:   CIWA-AR > 20 =     Answer:   4 mg     Order Specific Question:   CIWA-AR > 20 =     Answer:   Call Rapid Response   ondansetron (ZOFRAN) injection 4 mg   sodium chloride (PF) 0.9 % injection    Warren Danes, Lori M: cabinet override   iohexol (OMNIPAQUE) 300 MG/ML solution 100 mL   lactated ringers bolus 1,000 mL   fentaNYL (SUBLIMAZE) injection 50 mcg   HYDROmorphone (DILAUDID) injection 1 mg   ondansetron (ZOFRAN) injection 4 mg   HYDROmorphone (DILAUDID) injection 1 mg    -I have reviewed the patients home medicines and have made adjustments as needed  Critical interventions IV hydration  Cardiac Monitoring: The patient was maintained on a cardiac monitor.  I personally viewed and interpreted the cardiac monitored which showed an underlying rhythm of: normal sinus rhythm  Reevaluation: After the interventions noted above, I reevaluated the patient and found that they have :stayed the same  Co morbidities that complicate the patient evaluation  Past Medical History:  Diagnosis Date   Anxiety    Cirrhosis of liver (HCC)    Cocaine use 08/04/2018   History of cocaine use 08/04/2018   Hypertension    Polysubstance abuse (HCC)    Renal disorder    Kidney Infection       Dispostion: Patient discussed with hospitalist who will evaluate patient for admission.  Final Clinical Impression(s) / ED Diagnoses Final diagnoses:  Accidental overdose, initial encounter  Right lower quadrant abdominal pain    Rx / DC Orders ED Discharge Orders     None         Marita Kansas, PA-C 05/29/22 0700    Geoffery Lyons, MD 05/29/22 2312

## 2022-05-29 NOTE — ED Notes (Signed)
ED TO INPATIENT HANDOFF REPORT  ED Nurse Name and Phone #:  Kay Ricciuti, RN   S Name/Age/Gender [name] 36 y.o. female Room/Bed: WA16/WA16  Code Status   Code Status: Full Code  Home/SNF/Other  Patient oriented to: self, place, time, and situation Is this baseline? Yes   Triage Complete: Triage complete  Chief Complaint Abdominal pain [R10.9]  Triage Note Patient arrived stating she thought she was snorting cocaine for pain control, found unresponsive not breathing by family, given 4 of narcan prior to EMS arrival.    Allergies Allergies  Allergen Reactions   Ibuprofen Anaphylaxis, Swelling and Other (See Comments)    Patient thinks it may have caused her throat to "swell"  (** PATIENT HAS BEEN TOLERATING THIS IN 2023**)   Latex Shortness Of Breath   Peanut-Containing Drug Products Anaphylaxis   Penicillins Anaphylaxis and Hives    Has patient had a PCN reaction causing immediate rash, facial/tongue/throat swelling, SOB or lightheadedness with hypotension: Yes Has patient had a PCN reaction causing severe rash involving mucus membranes or skin necrosis: unknown Has patient had a PCN reaction that required hospitalization No Has patient had a PCN reaction occurring within the last 10 years: No If all of the above answers are "NO", then may proceed with Cephalosporin use. Tolerated ceftriaxone 2018 & 2019    Acetaminophen Other (See Comments)    Causes patient to have an upset stomach- Tolerating this in 2023 (325 mg tablets)   Codeine Hives and Itching   Lisinopril Swelling   Ativan [Lorazepam] Other (See Comments)    "Hallucinations," per patient    Level of Care/Admitting Diagnosis ED Disposition     ED Disposition  Admit   Condition  --   [place]: [facility] [[id number]]  Level of Care: Telemetry [5]  Admit to tele based on following criteria: Monitor for Ischemic changes  May place patient in observation at [facility]  or [place] if equivalent level of care is available:: No  Covid Evaluation: Asymptomatic - no recent exposure (last 10 days) testing not required  Diagnosis: Abdominal pain [id number]  Admitting Physician: Rhetta Mura [id number]  Attending Physician: Rhetta Mura [id number]          B Medical/Surgery History Past Medical History:  Diagnosis Date   Anxiety    Cirrhosis of liver ([place])    Cocaine use 08/04/2018   History of cocaine use 08/04/2018   Hypertension    Polysubstance abuse ([place])    Renal disorder    Kidney Infection    Past Surgical History:  Procedure Laterality Date   ARTERY REPAIR     CESAREAN SECTION     x 5   MOUTH SURGERY     TEAR DUCT PROBING     TUBAL LIGATION     VENTRAL HERNIA REPAIR N/A 10/03/2016   Procedure: OPEN INCARCERATED HERNIA REPAIR VENTRAL ADULT;  Surgeon: Ralene Ok, MD;  Location: [facility];  Service: General;  Laterality: N/A;     A IV Location/Drains/Wounds Patient Lines/Drains/Airways Status     Active Line/Drains/Airways     Name Placement date Placement time Site Days   Peripheral IV 05/29/22 22 G Right Antecubital 05/29/22  0104  Antecubital  less than 1   Incision (Closed) 05/13/22 Abdomen Left;Lower 05/13/22  1300  -- 16            Intake/Output Last 24 hours  Intake/Output Summary (Last 24 hours) at 05/29/2022 1611 Last data filed  at 05/29/2022 1133 Gross per 24 hour  Intake 1050 ml  Output --  Net 1050 ml    Labs/Imaging Results for orders placed or performed during the hospital encounter of 05/28/22 (from the past 48 hour(s))  CBC with Differential     Status: Abnormal   Collection Time: 05/29/22 12:35 AM  Result Value Ref Range   WBC 10.1 4.0 - 10.5 K/uL   RBC 4.27 3.87 - 5.11 MIL/uL   Hemoglobin 12.8 12.0 - 15.0 g/dL   HCT 39.3 36.0 - 46.0 %   MCV 92.0 80.0 - 100.0 fL   MCH 30.0 26.0 - 34.0 pg   MCHC 32.6 30.0 - 36.0 g/dL   RDW 15.6 (H) 11.5 - 15.5 %   Platelets 230 150 - 400 K/uL   nRBC  0.0 0.0 - 0.2 %   Neutrophils Relative % 75 %   Neutro Abs 7.6 1.7 - 7.7 K/uL   Lymphocytes Relative 16 %   Lymphs Abs 1.6 0.7 - 4.0 K/uL   Monocytes Relative 8 %   Monocytes Absolute 0.8 0.1 - 1.0 K/uL   Eosinophils Relative 1 %   Eosinophils Absolute 0.1 0.0 - 0.5 K/uL   Basophils Relative 0 %   Basophils Absolute 0.0 0.0 - 0.1 K/uL   Immature Granulocytes 0 %   Abs Immature Granulocytes 0.03 0.00 - 0.07 K/uL    Comment: Performed at [facility], [place] [address], [place], [place] [address]  Comprehensive metabolic panel     Status: Abnormal   Collection Time: 05/29/22 12:35 AM  Result Value Ref Range   Sodium 135 135 - 145 mmol/L   Potassium 3.2 (L) 3.5 - 5.1 mmol/L   Chloride 105 98 - 111 mmol/L   CO2 17 (L) 22 - 32 mmol/L   Glucose, Bld 117 (H) 70 - 99 mg/dL    Comment: Glucose reference range applies only to samples taken after fasting for at least 8 hours.   BUN 16 6 - 20 mg/dL   Creatinine, Ser 0.61 0.44 - 1.00 mg/dL   Calcium 9.1 8.9 - 10.3 mg/dL   Total Protein 7.7 6.5 - 8.1 g/dL   Albumin 4.1 3.5 - 5.0 g/dL   AST 40 15 - 41 U/L   ALT 28 0 - 44 U/L   Alkaline Phosphatase 89 38 - 126 U/L   Total Bilirubin 0.4 0.3 - 1.2 mg/dL   GFR, Estimated >60 >60 mL/min    Comment: (NOTE) Calculated using the CKD-EPI Creatinine Equation (2021)    Anion gap 13 5 - 15    Comment: Performed at [facility], [place] [address], [place], [place] [address]  Lipase, blood     Status: None   Collection Time: 05/29/22 12:35 AM  Result Value Ref Range   Lipase 36 11 - 51 U/L    Comment: Performed at [facility], [place] [address] [place], [place] [address]  Magnesium     Status: None   Collection Time: 05/29/22 12:35 AM  Result Value Ref Range   Magnesium 1.9 1.7 - 2.4 mg/dL    Comment: Performed at [facility], [place] [address] [place], [place] [address]  Lactic acid, plasma     Status: Abnormal   Collection  Time: 05/29/22 12:36 AM  Result Value Ref Range   Lactic Acid, Venous 2.2 (HH) 0.5 - 1.9 mmol/L    Comment: CRITICAL RESULT CALLED TO, READ BACK BY AND VERIFIED WITH ELLWANGER,A AT 0212  ON 05/29/22 BY VAZQUEZJ Performed at [facility], [place] [address], [place], [place] [address]   Ethanol     Status: Abnormal   Collection Time: 05/29/22 12:53 AM  Result Value Ref Range   Alcohol, Ethyl (B) 39 (H) <10 mg/dL    Comment: (NOTE) Lowest detectable limit for serum alcohol is 10 mg/dL.  For medical purposes only. Performed at [facility], [place] [address], [place], [place] [address]   I-Stat Beta hCG blood, ED ([facility], [facility], [facility] only)     Status: None   Collection Time: 05/29/22  1:01 AM  Result Value Ref Range   I-stat hCG, quantitative <5.0 <5 mIU/mL   Comment 3            Comment:   GEST. AGE      CONC.  (mIU/mL)   <=1 WEEK        5 - 50     2 WEEKS       50 - 500     3 WEEKS       100 - 10,000     4 WEEKS     1,000 - 30,000        FEMALE AND NON-PREGNANT FEMALE:     LESS THAN 5 mIU/mL   Lactic acid, plasma     Status: None   Collection Time: 05/29/22  3:34 AM  Result Value Ref Range   Lactic Acid, Venous 1.6 0.5 - 1.9 mmol/L    Comment: Performed at [facility], [place] [address], [place], [place] [address]  Urinalysis, Routine w reflex microscopic Urine, Clean Catch     Status: Abnormal   Collection Time: 05/29/22  4:16 AM  Result Value Ref Range   Color, Urine YELLOW YELLOW   APPearance CLOUDY (A) CLEAR   Specific Gravity, Urine <1.005 (L) 1.005 - 1.030   pH 6.0 5.0 - 8.0   Glucose, UA NEGATIVE NEGATIVE mg/dL   Hgb urine dipstick NEGATIVE NEGATIVE   Bilirubin Urine NEGATIVE NEGATIVE   Ketones, ur 5 (A) NEGATIVE mg/dL   Protein, ur NEGATIVE NEGATIVE mg/dL   Nitrite NEGATIVE NEGATIVE   Leukocytes,Ua NEGATIVE NEGATIVE    Comment: Performed at [facility] [address], [place], [place] [address]  Rapid  urine drug screen (hospital performed)     Status: Abnormal   Collection Time: 05/29/22  4:16 AM  Result Value Ref Range   Opiates NONE DETECTED NONE DETECTED   Cocaine POSITIVE (A) NONE DETECTED   Benzodiazepines POSITIVE (A) NONE DETECTED   Amphetamines NONE DETECTED NONE DETECTED   Tetrahydrocannabinol NONE DETECTED NONE DETECTED   Barbiturates NONE DETECTED NONE DETECTED    Comment: (NOTE) DRUG SCREEN FOR MEDICAL PURPOSES ONLY.  IF CONFIRMATION IS NEEDED FOR ANY PURPOSE, NOTIFY LAB WITHIN 5 DAYS.  LOWEST DETECTABLE LIMITS FOR URINE DRUG SCREEN Drug Class                     Cutoff (ng/mL) Amphetamine and metabolites    1000 Barbiturate and metabolites    200 Benzodiazepine                 [id number] Tricyclics and metabolites     300 Opiates and metabolites        300 Cocaine and metabolites        300 THC  50 Performed at [facility], [place] [address] [place], [place] [address]    US Pelvis Complete  Result Date: 05/29/2022 CLINICAL DATA:  Initial evaluation for acute pelvic pain. EXAM: TRANSABDOMINAL AND TRANSVAGINAL ULTRASOUND OF PELVIS DOPPLER ULTRASOUND OF OVARIES TECHNIQUE: Both transabdominal and transvaginal ultrasound examinations of the pelvis were performed. Transabdominal technique was performed for global imaging of the pelvis including uterus, ovaries, adnexal regions, and pelvic cul-de-sac. It was necessary to proceed with endovaginal exam following the transabdominal exam to visualize the endometrium. Color and duplex Doppler ultrasound was utilized to evaluate blood flow to the ovaries. COMPARISON:  CT from earlier the same day. FINDINGS: Uterus Measurements: 10.1 x 4.7 x 6.0 cm = volume: 148.0 mL. Uterus is anteverted. No discrete fibroid. C-section scar noted. Multiple nabothian cysts noted about the cervix. Endometrium Thickness: 8 mm.  No focal abnormality visualized. Right ovary Measurements: 4.9 x 3.9 x 3.9 cm = volume:  38.5 mL. 4.0 x 3.4 x 3.5 cm complex hypoechoic cyst with internal reticular echogenicity, most characteristic of a hemorrhagic cyst. No internal vascularity or solid nodularity. Left ovary Measurements: 2.8 x 2.1 x 2.8 cm = volume: 8.4 mL. Normal appearance/no adnexal mass. Pulsed Doppler evaluation of both ovaries demonstrates normal low-resistance arterial and venous waveforms. Other findings Small volume free fluid within the pelvis. IMPRESSION: 1. 4 cm complex right ovarian cyst, most characteristic of a benign hemorrhagic cyst. Associated small volume free fluid within the pelvis. 2. No evidence for ovarian torsion or other acute finding. Electronically Signed   By: Jeannine Boga M.D.   On: 05/29/2022 04:57   US Transvaginal Non-OB  Result Date: 05/29/2022 CLINICAL DATA:  Initial evaluation for acute pelvic pain. EXAM: TRANSABDOMINAL AND TRANSVAGINAL ULTRASOUND OF PELVIS DOPPLER ULTRASOUND OF OVARIES TECHNIQUE: Both transabdominal and transvaginal ultrasound examinations of the pelvis were performed. Transabdominal technique was performed for global imaging of the pelvis including uterus, ovaries, adnexal regions, and pelvic cul-de-sac. It was necessary to proceed with endovaginal exam following the transabdominal exam to visualize the endometrium. Color and duplex Doppler ultrasound was utilized to evaluate blood flow to the ovaries. COMPARISON:  CT from earlier the same day. FINDINGS: Uterus Measurements: 10.1 x 4.7 x 6.0 cm = volume: 148.0 mL. Uterus is anteverted. No discrete fibroid. C-section scar noted. Multiple nabothian cysts noted about the cervix. Endometrium Thickness: 8 mm.  No focal abnormality visualized. Right ovary Measurements: 4.9 x 3.9 x 3.9 cm = volume: 38.5 mL. 4.0 x 3.4 x 3.5 cm complex hypoechoic cyst with internal reticular echogenicity, most characteristic of a hemorrhagic cyst. No internal vascularity or solid nodularity. Left ovary Measurements: 2.8 x 2.1 x 2.8 cm =  volume: 8.4 mL. Normal appearance/no adnexal mass. Pulsed Doppler evaluation of both ovaries demonstrates normal low-resistance arterial and venous waveforms. Other findings Small volume free fluid within the pelvis. IMPRESSION: 1. 4 cm complex right ovarian cyst, most characteristic of a benign hemorrhagic cyst. Associated small volume free fluid within the pelvis. 2. No evidence for ovarian torsion or other acute finding. Electronically Signed   By: Jeannine Boga M.D.   On: 05/29/2022 04:57   [place] Art/Ven Flow Abd Pelv Doppler  Result Date: 05/29/2022 CLINICAL DATA:  Initial evaluation for acute pelvic pain. EXAM: TRANSABDOMINAL AND TRANSVAGINAL ULTRASOUND OF PELVIS DOPPLER ULTRASOUND OF OVARIES TECHNIQUE: Both transabdominal and transvaginal ultrasound examinations of the pelvis were performed. Transabdominal technique was performed for global imaging of the pelvis including uterus, ovaries, adnexal regions, and pelvic cul-de-sac. It was necessary  to proceed with endovaginal exam following the transabdominal exam to visualize the endometrium. Color and duplex Doppler ultrasound was utilized to evaluate blood flow to the ovaries. COMPARISON:  CT from earlier the same day. FINDINGS: Uterus Measurements: 10.1 x 4.7 x 6.0 cm = volume: 148.0 mL. Uterus is anteverted. No discrete fibroid. C-section scar noted. Multiple nabothian cysts noted about the cervix. Endometrium Thickness: 8 mm.  No focal abnormality visualized. Right ovary Measurements: 4.9 x 3.9 x 3.9 cm = volume: 38.5 mL. 4.0 x 3.4 x 3.5 cm complex hypoechoic cyst with internal reticular echogenicity, most characteristic of a hemorrhagic cyst. No internal vascularity or solid nodularity. Left ovary Measurements: 2.8 x 2.1 x 2.8 cm = volume: 8.4 mL. Normal appearance/no adnexal mass. Pulsed Doppler evaluation of both ovaries demonstrates normal low-resistance arterial and venous waveforms. Other findings Small volume free fluid within the pelvis.  IMPRESSION: 1. 4 cm complex right ovarian cyst, most characteristic of a benign hemorrhagic cyst. Associated small volume free fluid within the pelvis. 2. No evidence for ovarian torsion or other acute finding. Electronically Signed   By: Jeannine Boga M.D.   On: 05/29/2022 04:57   CT ABDOMEN PELVIS W CONTRAST  Result Date: 05/29/2022 CLINICAL DATA:  Abdominal pain, acute, nonlocalized. Nausea, vomiting. Illicit drug use, altered mental status. EXAM: CT ABDOMEN AND PELVIS WITH CONTRAST TECHNIQUE: Multidetector CT imaging of the abdomen and pelvis was performed using the standard protocol following bolus administration of intravenous contrast. RADIATION DOSE REDUCTION: This exam was performed according to the departmental dose-optimization program which includes automated exposure control, adjustment of the mA and/or kV according to patient size and/or use of iterative reconstruction technique. CONTRAST:  123mL OMNIPAQUE IOHEXOL 300 MG/ML  SOLN COMPARISON:  05/10/2022 FINDINGS: Lower chest: No acute abnormality. Hepatobiliary: Stable tiny cyst within the subserosal left hepatic lobe. Mild hepatic steatosis. No enhancing intrahepatic mass. No intra or extrahepatic biliary ductal dilation. Gallbladder unremarkable. Pancreas: Unremarkable Spleen: Unremarkable Adrenals/Urinary Tract: Adrenal glands are unremarkable. Kidneys are normal, without renal calculi, focal lesion, or hydronephrosis. Bladder is unremarkable. Stomach/Bowel: Stomach is within normal limits. Appendix appears normal. No evidence of bowel wall thickening, distention, or inflammatory changes. Vascular/Lymphatic: No significant vascular findings are present. No enlarged abdominal or pelvic lymph nodes. Reproductive: Inflammatory changes within the adnexa bilaterally have improved in the interval with resolution of previously noted left pelvic fluid collections. 3.8 cm right adnexal cystic lesion has developed demonstrating a thin enhancing  internal septation which may represent a follicular cyst, but is not well characterized on this examination. Uterus unremarkable. Other: No abdominal wall hernia. Musculoskeletal: No acute bone abnormality. No lytic or blastic bone lesion. IMPRESSION: 1. No acute intra-abdominal pathology identified. No definite radiographic explanation for the patient's reported symptoms. 2. Interval improvement in bilateral adnexal inflammatory changes related to pelvic inflammatory disease and resolution of previously noted left pelvic fluid collections. Interval development of a 3.8 cm right adnexal cystic lesion. This may represent a follicular cyst but is not well characterized on this examination. This would be better assessed with dedicated pelvic sonography. 3. Mild hepatic steatosis. Electronically Signed   By: Fidela Salisbury M.D.   On: 05/29/2022 03:37    Pending Labs Unresulted Labs (From admission, onward)    None       Vitals/Pain Today's Vitals   05/29/22 1100 05/29/22 1230 05/29/22 1334 05/29/22 1500  BP:  120/80    Pulse:  89    Resp:  13    Temp: 98.2 F (36.8 C)  98 F (36.7 C)  TempSrc: Oral     SpO2:  96%    PainSc:   5      Isolation Precautions No active isolations  Medications Medications  LORazepam (ATIVAN) tablet 1-4 mg ( Oral See Alternative 05/29/22 0849)    Or  LORazepam (ATIVAN) injection 1-4 mg (1 mg Intravenous Given 05/29/22 0849)  naloxone [facility]) injection 0.4 mg (has no administration in time range)  HYDROmorphone (DILAUDID) injection 0.5 mg (0.5 mg Intravenous Given 05/29/22 1254)  ondansetron (ZOFRAN) injection 4 mg (has no administration in time range)  0.9 %  sodium chloride infusion ( Intravenous New Bag/Given 05/29/22 0800)  ciprofloxacin (CIPRO) tablet 500 mg (500 mg Oral Given 05/29/22 1102)  doxycycline (VIBRA-TABS) tablet 100 mg (100 mg Oral Given 05/29/22 1101)  clopidogrel (PLAVIX) tablet 75 mg (75 mg Oral Given 05/29/22 1103)  metroNIDAZOLE (FLAGYL)  tablet 500 mg (500 mg Oral Given 05/29/22 1102)  atorvastatin (LIPITOR) tablet 40 mg (40 mg Oral Given 05/29/22 1102)  amLODipine (NORVASC) tablet 5 mg (5 mg Oral Given 05/29/22 1247)  influenza vac split quadrivalent PF (FLUARIX) injection 0.5 mL (has no administration in time range)  ondansetron (ZOFRAN) injection 4 mg (4 mg Intravenous Given 05/29/22 0132)  sodium chloride (PF) 0.9 % injection (  Given by Other 05/29/22 0306)  iohexol (OMNIPAQUE) 300 MG/ML solution 100 mL (100 mLs Intravenous Contrast Given 05/29/22 0236)  lactated ringers bolus 1,000 mL (0 mLs Intravenous Stopped 05/29/22 0800)  fentaNYL (SUBLIMAZE) injection 50 mcg (50 mcg Intravenous Given 05/29/22 0253)  HYDROmorphone (DILAUDID) injection 1 mg (1 mg Intravenous Given 05/29/22 0445)  ondansetron (ZOFRAN) injection 4 mg (4 mg Intravenous Given 05/29/22 0445)  HYDROmorphone (DILAUDID) injection 1 mg (1 mg Intravenous Given 05/29/22 0654)  potassium chloride SA (KLOR-CON M) CR tablet 40 mEq (40 mEq Oral Given 05/29/22 1102)  magnesium sulfate IVPB 2 g 50 mL (0 g Intravenous Stopped 05/29/22 1133)    Mobility walks Low fall risk   Focused Assessments    R Recommendations: See Admitting Provider Note  Report given to:   Additional Notes:

## 2022-05-29 NOTE — ED Notes (Signed)
Call received from pt mother Morene Rankins [phone] requesting rtn call for pt status/updates. [employer]

## 2022-05-29 NOTE — ED Notes (Signed)
Pt mom at bedside. Pt mom & pt arguing. Mom left bedside.

## 2022-05-29 NOTE — ED Notes (Signed)
Critical lactic acid 2.2. MD made aware.

## 2022-05-29 NOTE — ED Notes (Signed)
2nd message asking if IP RN ready to receive pt sent @1700 .

## 2022-05-29 NOTE — ED Notes (Signed)
Call received from pt mother Tina Melton@ [phone] requesting rtn call for pt status/updates. [employer] 

## 2022-05-29 NOTE — Progress Notes (Signed)

## 2022-05-29 NOTE — ED Notes (Signed)
Patient provided with biscuit and ginger ale.

## 2022-05-29 NOTE — H&P (Signed)
History and Physical    Patient: [name] NGE:[id number] DOB: November 27, 1985 DOA: 05/28/2022 DOS: the patient was seen and examined on 05/29/2022 PCP: Patient, No Pcp Per  Patient coming from: Home  Chief Complaint:  Chief Complaint  Patient presents with   Drug Overdose   HPI: [name] is a 36 y.o. female with medical history significant of anxiety, liver cirrhosis, alcohol abuse, cocaine abuse, hypertension, unspecified renal disorder, history of other nonhemorrhagic CVA, ventral hernia, constipation who has been recently admitted and discharged twice in the past month for hypertensive emergency and PID.  She left AMA on 05/13/2022 and did not finish PID treatment in the hospital.  She left with oral ciprofloxacin and metronidazole to take at home.  Yesterday, she was following with her PCP and was referred to the emergency department since she was still complaining of significant pelvic pain, nausea and emesis.  She got tired of waiting in the ED and left because she wants to get something for her pain so she drank alcohol and used cocaine which she believes was laced with an unknown substance. She does not remember more details about this.  She was found unresponsive by family members and had to be given naloxone.  No fever, chills or night sweats. No sore throat, rhinorrhea, dyspnea, wheezing or hemoptysis.  No chest pain, palpitations, diaphoresis, PND, orthopnea or pitting edema of the lower extremities.  No constipation, melena or hematochezia.  No flank pain, dysuria, frequency or hematuria.  No polyuria, polydipsia, polyphagia or blurred vision.  ED course: Initial vital signs were temperature 97.6 F, pulse 100, respiration 15, BP 168/116 mmHg O2 sat 96% on room air.  The patient received fentanyl 50 mcg IV, hydromorphone 1 mg IVP x2 LR 1000 mL bolus, magnesium sulfate 2 g IVPB, ondansetron 4 mg IVP x2 and I added KCl 40 mEq every 4 hours x 2 doses.  Lab work: Urinalysis  with decrease a specific gravity and ketonuria 5 mg/dL.  UDS was positive for cocaine and benzodiazepines.  CBC showed a white count 10.1, hemoglobin 12.9 g/dL platelets 230.  Lipase was normal.  Lactic acid was 2.2 then 1.6 mmol/L.  Magnesium 1.9 mg deciliter.  CMP showed a potassium of 3.2 and CO2 of 17 mmol/L with a normal anion gap.  The rest of the electrolytes were normal.  Glucose 170 mg/dL.  Hepatic and renal functions were normal.  Imaging: CT abdomen/pelvis with contrast with no acute intra-abdominal pathology.  There was interval improvement of bilateral adnexal inflammatory changes related to PID and resolution of previously noted left pelvic fluid collections.  There is an interval development of a 3.8 cm right adnexal cystic lesion.  There is mild hepatic steatosis.  Transabdominal and transvaginal US of pelvis showed a 4 cm complex right ovarian cyst likely characteristic of a benign hemorrhagic cyst which is associated with small volume free fluid within the pelvis.  There is no evidence for ovarian torsion or other acute finding.   Review of Systems: As mentioned in the history of present illness. All other systems reviewed and are negative. Past Medical History:  Diagnosis Date   Anxiety    Cirrhosis of liver ([place])    Cocaine use 08/04/2018   History of cocaine use 08/04/2018   Hypertension    Polysubstance abuse ([place])    Renal disorder    Kidney Infection    Past Surgical History:  Procedure Laterality Date   ARTERY REPAIR     CESAREAN SECTION  x 5   MOUTH SURGERY     TEAR DUCT PROBING     TUBAL LIGATION     VENTRAL HERNIA REPAIR N/A 10/03/2016   Procedure: OPEN INCARCERATED HERNIA REPAIR VENTRAL ADULT;  Surgeon: Axel Filler, MD;  Location: [facility] OR;  Service: General;  Laterality: N/A;   Social History:  reports that she has been smoking cigarettes. She has been smoking an average of .5 packs per day. She has never used smokeless tobacco. She reports current alcohol  use. She reports that she does not currently use drugs after having used the following drugs: Cocaine.  Allergies  Allergen Reactions   Ibuprofen Anaphylaxis, Swelling and Other (See Comments)    Patient thinks it may have caused her throat to "swell"  (** PATIENT HAS BEEN TOLERATING THIS IN 2023**)   Latex Shortness Of Breath   Peanut-Containing Drug Products Anaphylaxis   Penicillins Anaphylaxis and Hives    Has patient had a PCN reaction causing immediate rash, facial/tongue/throat swelling, SOB or lightheadedness with hypotension: Yes Has patient had a PCN reaction causing severe rash involving mucus membranes or skin necrosis: unknown Has patient had a PCN reaction that required hospitalization No Has patient had a PCN reaction occurring within the last 10 years: No If all of the above answers are "NO", then may proceed with Cephalosporin use. Tolerated ceftriaxone 2018 & 2019    Acetaminophen Other (See Comments)    Causes patient to have an upset stomach- Tolerating this in 2023 (325 mg tablets)   Codeine Hives and Itching   Lisinopril Swelling   Ativan [Lorazepam] Other (See Comments)    "Hallucinations," per patient    Family History  Problem Relation Age of Onset   CAD Other    Diabetes Other    Hypertension Other    Cancer Other    Thyroid disease Other     Prior to Admission medications   Medication Sig Start Date End Date Taking? Authorizing Provider  atorvastatin (LIPITOR) 40 MG tablet Take 1 tablet (40 mg total) by mouth daily. 05/15/22  Yes Lanae Boast, MD  ciprofloxacin (CIPRO) 500 MG tablet Take 1 tablet (500 mg total) by mouth 2 (two) times daily for 14 days. 05/15/22 06/01/22 Yes Lanae Boast, MD  cloNIDine (CATAPRES) 0.1 MG tablet Take 0.1 mg by mouth 2 (two) times daily.   Yes [provider]  clopidogrel (PLAVIX) 75 MG tablet Take 1 tablet (75 mg total) by mouth daily. 05/15/22  Yes Lanae Boast, MD  doxycycline (VIBRA-TABS) 100 MG tablet Take 1 tablet  (100 mg total) by mouth every 12 (twelve) hours for 10 days. 05/15/22 05/29/22 Yes Lanae Boast, MD  meloxicam (MOBIC) 7.5 MG tablet Take 1 tablet (7.5 mg total) by mouth daily. 05/28/22  Yes Ivonne Andrew, NP  metroNIDAZOLE (FLAGYL) 500 MG tablet Take 1 tablet (500 mg total) by mouth 2 (two) times daily for 14 days. 05/15/22 06/01/22 Yes Lanae Boast, MD  aspirin EC 81 MG tablet Take 1 tablet (81 mg total) by mouth daily. Swallow whole. Patient not taking: Reported on 05/29/2022 05/14/22   Lanae Boast, MD    Physical Exam: Vitals:   05/29/22 0515 05/29/22 0530 05/29/22 0600 05/29/22 0700  BP:  (!) 143/95 131/89 127/78  Pulse: 94 90 97 (!) 102  Resp: 16 14 13 10   Temp:      TempSrc:      SpO2: 95% 97% 95% 92%   Physical Exam Vitals and nursing note reviewed.  Constitutional:  General: She is awake. She is not in acute distress.    Appearance: Normal appearance. She is not ill-appearing.  HENT:     Head: Normocephalic and atraumatic.     Nose: No rhinorrhea.     Mouth/Throat:     Lips: Lesions present.     Mouth: Mucous membranes are dry.     Comments: Positive ecchymosis on right sided lips. Eyes:     General: No scleral icterus.    Pupils: Pupils are equal, round, and reactive to light.  Neck:     Vascular: No JVD.  Cardiovascular:     Rate and Rhythm: Normal rate and regular rhythm.     Heart sounds: S1 normal and S2 normal.  Pulmonary:     Effort: Pulmonary effort is normal.     Breath sounds: Normal breath sounds. No wheezing, rhonchi or rales.  Abdominal:     General: Bowel sounds are normal. There is no distension.     Palpations: Abdomen is soft.     Tenderness: There is abdominal tenderness in the right lower quadrant and left lower quadrant. There is no right CVA tenderness, left CVA tenderness, guarding or rebound.  Musculoskeletal:     Cervical back: Neck supple.     Right lower leg: No edema.     Left lower leg: No edema.  Skin:    General: Skin is warm and  dry.  Neurological:     General: No focal deficit present.     Mental Status: She is alert and oriented to person, place, and time.  Psychiatric:        Mood and Affect: Mood normal.        Behavior: Behavior normal. Behavior is cooperative.    Data Reviewed:  There are no new results to review at this time.  Assessment and Plan: Principal Problem:   Abdominal pain In the setting of:   PID (acute pelvic inflammatory disease) Observation/telemetry. Analgesics as needed. Antiemetics as needed. Continue ciprofloxacin 500 mg p.o. twice daily. Continue doxycycline 100 mg p.o. every 12 hours. Follow-up CBC and CMP in AM.  Active Problems:   Alcohol dependence (HCC) CIWA protocol with lorazepam. Magnesium sulfate supplementation. Folate, MVI and thiamine. Consult TOC.    Hypertension Hold clonidine due to noncompliance. Clonidine carries high risk for rebound hypertension. Begin amlodipine 5 mg p.o. daily. Monitor blood pressure and heart rate.    Hyperlipidemia Continue atorvastatin 40 mg p.o. daily.    Cocaine abuse (HCC) Advised about cardiotoxicity. Advised risk of hypertensive crisis. Advised about risk of atherosclerosis. Consult TOC team.    Hypokalemia Supplemented. Received magnesium sulfate. Follow-up potassium level in the morning.    Tobacco abuse Nicotine replacement therapy as needed. Tobacco cessation has been advised.     Advance Care Planning:   Code Status: Full Code   Consults:   Family Communication:   Severity of Illness: The appropriate patient status for this patient is OBSERVATION. Observation status is judged to be reasonable and necessary in order to provide the required intensity of service to ensure the patient's safety. The patient's presenting symptoms, physical exam findings, and initial radiographic and laboratory data in the context of their medical condition is felt to place them at decreased risk for further clinical  deterioration. Furthermore, it is anticipated that the patient will be medically stable for discharge from the hospital within 2 midnights of admission.   Author: Bobette Mo, MD 05/29/2022 7:25 AM  For on call review [email or web address].  This document was prepared using Dragon voice recognition software and may contain some unintended transcription errors.

## 2022-05-29 NOTE — ED Notes (Signed)
Call 5th floor to see if IP RN ready to receive pt. IP RN unable to receive pt at this time. ED CN aware.

## 2022-05-29 NOTE — ED Notes (Signed)
Handoff send to IP RN @ [phone]

## 2022-05-29 NOTE — Progress Notes (Signed)
  Carryover admission to the Day Admitter.  I discussed this case with the [place], Evlyn Courier, PA.  Per these discussions:   This is a 36 year old female who was recently hospitalized for pelvic abscess status post drainage by interventional radiology on 05/13/2022, who is being admitted with intractable abdominal discomfort of unclear etiology, presenting with several days of new abdominal discomfort associate with nausea/vomiting, with CT abdomen/pelvis showing interval improvement in previously noted pelvic abscess status post IR drainage, and no evidence of acute intra-abdominal or acute intrapelvic process.  The patient reportedly acknowledges that she has been attempting to manage her abdominal discomfort with recreational drugs over the last several days, including cocaine use as well as some ethanol use.   Labs performed in the ED today notable for initial mild lactic acidosis, which is resolved following interval IV fluids.  Nausea/vomiting improved in the ED with IV Zofran.  However, patient continues to report significant abdominal discomfort in spite of multiple doses of IV analgesics in the emergency department this evening.  I have placed an order for observation to med telemetry for further evaluation and management of intractable abdominal discomfort of unclear etiology, including for pain control.   I have placed some additional preliminary admit orders via the adult multi-morbid admission order set. I have also ordered prn IV Dilaudid, prn IV Zofran.  Of note, there are existing orders for CIWA protocol, which been continued.    Babs Bertin, DO Hospitalist

## 2022-05-30 DIAGNOSIS — R52 Pain, unspecified: Secondary | ICD-10-CM | POA: Diagnosis present

## 2022-05-30 LAB — BASIC METABOLIC PANEL
Anion gap: 5 (ref 5–15)
BUN: 16 mg/dL (ref 6–20)
CO2: 25 mmol/L (ref 22–32)
Calcium: 9 mg/dL (ref 8.9–10.3)
Chloride: 111 mmol/L (ref 98–111)
Creatinine, Ser: 0.62 mg/dL (ref 0.44–1.00)
GFR, Estimated: >60 mL/min (ref >60)
Glucose, Bld: 103 mg/dL — ABNORMAL HIGH (ref 70–99)
Potassium: 4 mmol/L (ref 3.5–5.1)
Sodium: 141 mmol/L (ref 135–145)

## 2022-05-30 MED ORDER — AMLODIPINE BESYLATE 10 MG PO TABS
10.0000 mg | ORAL_TABLET | Freq: Every day | ORAL | Status: DC
  Administered 2022-05-31: 10 mg via ORAL
  Filled 2022-05-30: qty 1

## 2022-05-30 MED ORDER — OXYCODONE HCL 5 MG PO TABS
5.0000 mg | ORAL_TABLET | Freq: Four times a day (QID) | ORAL | Status: DC | PRN
  Administered 2022-05-30 – 2022-05-31 (×5): 5 mg via ORAL
  Filled 2022-05-30 (×5): qty 1

## 2022-05-30 MED ORDER — ORAL CARE MOUTH RINSE: 15.0000 mL | OROMUCOSAL | Status: DC | PRN

## 2022-05-30 MED ORDER — HYDROXYZINE HCL 25 MG PO TABS
25.0000 mg | ORAL_TABLET | Freq: Three times a day (TID) | ORAL | Status: DC | PRN
  Administered 2022-05-30 – 2022-05-31 (×2): 25 mg via ORAL
  Filled 2022-05-30 (×2): qty 1

## 2022-05-30 MED ORDER — AMLODIPINE BESYLATE 5 MG PO TABS
2.5000 mg | ORAL_TABLET | Freq: Once | ORAL | Status: AC
  Administered 2022-05-30: 2.5 mg via ORAL
  Filled 2022-05-30: qty 1

## 2022-05-30 NOTE — Progress Notes (Signed)
Mobility Specialist - Progress Note   05/30/22 1502  Mobility  Activity Refused mobility     Reason for Cancellation/Refusal: Pt declined mobility at this time. Pt stated they are withdrawing right now & would possible want to walk on Sunday. Will check back as schedule permits.    [facility]

## 2022-05-30 NOTE — Plan of Care (Signed)

## 2022-05-30 NOTE — Progress Notes (Signed)
Patient requested to see RN frequently and asked for pain medication had to be given at her schedule. RN educated patient that pain medication was ordered as needed and RN needed to assess her pain level before giving pain medication as MD ordered. Patient was not please with it and still tried to argue. Patient was given pain medication just 1 hour ago and her pain was 2 at 0 - 10 pain scale when RN came to assess patient.

## 2022-05-30 NOTE — Progress Notes (Signed)
PROGRESS NOTE [name]  TFT:[id number] DOB: 09-20-1985 DOA: 05/28/2022 PCP: Patient, No Pcp Per   Brief Narrative/Hospital Course:  36 y.o.f w anxiety disorder, liver cirrhosis, alcohol abuse, cocaine abuse, hypertension, unspecified renal disorder, history of other nonhemorrhagic CVA, ventral hernia, constipation who has been recently admitted and discharged twice in the past month for hypertensive emergency and PID-left AMA on 05/13/2022 and did not finish PID treatment in the hospital-left with oral ciprofloxacin and metronidazole to take at home. Followed up with her PCP who referred to the ED since she was complaining of significant pelvic pain, nausea and emesis> left without being seen due to long wait time in the ED and apparently drank alcohol and used cocaine which she believes was laced with an unknown substance. She was found unresponsive by family members and had to be given naloxone In ED-Labs with ketonuria UDS was positive for cocaine and benzodiazepines.  CBC showed a white count 10.1, hemoglobin 12.9 g/dL platelets 230.  Lipase was normal.  Lactic acid was 2.2 then 1.6 mmol/L.  Magnesium 1.9 , CMP showed a potassium of 3.2 and CO2 of 17 mmol/L with a normal anion gap.  The rest of the electrolytes were normal,Hepatic and renal functions were normal. vital signs were temperature 97.6 F, pulse 100, respiration 15, BP 168/116 mmHg O2 sat 96% on room air.  The patient received fentanyl 50 mcg IV, hydromorphone 1 mg IVP x2 LR 1000 mL bolus, magnesium sulfate 2 g IVPB, ondansetron 4 mg IVP x2 and I added KCl 40 mEq every 4 hours x 2 doses. CT abdomen/pelvis with contrast with no acute intra-abdominal pathology.  There was interval improvement of bilateral adnexal inflammatory changes related to PID and resolution of previously noted left pelvic fluid collections.  There is an interval development of a 3.8 cm right adnexal cystic lesion.  There is mild hepatic steatosis.  Transabdominal  and transvaginal US of pelvis showed a 4 cm complex right ovarian cyst likely characteristic of a benign hemorrhagic cyst which is associated with small volume free fluid within the pelvis.  There is no evidence for ovarian torsion or other acute finding. Patient was admitted for observation overnight continued on Cipro and doxycycline, placed on CIWA Ativan for alcohol dependence.    Subjective: Seen and examined this morning, resting comfortably ambulatory.  Blood pressure trending up Reports she is quitting all forms of drug.   Assessment and Plan: Principal Problem:   Abdominal pain Active Problems:   Hypertension   Cocaine abuse (HCC)   Alcohol dependence (HCC)   Hypokalemia   PID (acute pelvic inflammatory disease)   Tobacco abuse   Hyperlipidemia  Abdominal pain appears chronic Recent PID: Labs no leukocytosis no fever CT abdomen pelvis no acute abdominal pathology improvement of adnexal inflammatory changes related to PID and resolution of previously noted left pelvic fluid, adnexal cystic lesion appears to be benign hemorrhagic cyst, no ovarian torsion. On cirpo/doxy.  Wean off IV fluids to oral regimen, Tylenol/Toradol if needed.  Mild lactic acidosis likely dehydration induced, resolved. Alcohol dependence placed on CIWA protocol,Folate thiamine MV  History of CVA on Plavix/lipitor Hypertension: BP now uptrending on admission clonidine discontinued and started on amlodipine> increase amlodipine for blood.  Hyperlipidemia on Lipitor Cocaine abuse: Advised cessation TOC consulted Tobacco abuse nicotine patch PRN Documented previously patient has been requesting pain meds despite being drowsy, minimize opiates due to risks associated> transition to nonoperative regimen. Hypokalemia repleted Class I obesity with BMI 32: Will benefit with weight  loss healthy lifestyle PCP follow-up Continue one-to-one safety observation.  Due to patient's drug abuse.  She is alert awake  oriented x3 does not endorse depressed mood denies suicidal ideation homicidal ideation.  DVT prophylaxis: SCDs Start: 05/29/22 4315 Code Status:   Code Status: Full Code Family Communication: plan of care discussed with patient at bedside. Patient status is: Observation, change to inpatient due to continued need for IV regimen  Level of care: Telemetry  Dispo: The patient is from: Home-mother's            Anticipated disposition: same  Mobility Assessment (last 72 hours)     Mobility Assessment     Row Name 05/29/22 2100           Does patient have an order for bedrest or is patient medically unstable No - Continue assessment       What is the highest level of mobility based on the progressive mobility assessment? Level 5 (Walks with assist in room/hall) - Balance while stepping forward/back and can walk in room with assist - Complete                 Objective: Vitals last 24 hrs: Vitals:   05/30/22 1018 05/30/22 1047 05/30/22 1100 05/30/22 1151  BP:    (!) 182/94  Pulse:    68  Resp: 15 12 11 17   Temp:    97.9 F (36.6 C)  TempSrc:    Oral  SpO2:    98%  Weight:      Height:       Weight change:   Physical Examination: General exam: alert awake, older than stated age HEENT:Oral mucosa moist, Ear/Nose WNL grossly Respiratory system: bilaterally clear BS, no use of accessory muscle Cardiovascular system: S1 & S2 +, No JVD. Gastrointestinal system: Abdomen soft,NT,ND, BS+ Nervous System:Alert, awake, moving extremities. Extremities: LE edema mild,distal peripheral pulses palpable.  Skin: No rashes,no icterus. MSK: Normal muscle bulk,tone, power  Medications reviewed:  Scheduled Meds:  [START ON 05/31/2022] amLODipine  10 mg Oral Daily   amLODipine  2.5 mg Oral Once   atorvastatin  40 mg Oral Daily   Chlorhexidine Gluconate Cloth  6 each Topical Daily   ciprofloxacin  500 mg Oral BID   clopidogrel  75 mg Oral Daily   doxycycline  100 mg Oral Q12H    metroNIDAZOLE  500 mg Oral BID   nicotine  21 mg Transdermal Daily   sodium chloride flush  10-40 mL Intracatheter Q12H   Continuous Infusions:  Unresulted Labs (From admission, onward)    None     Data Reviewed: I have personally reviewed following labs and imaging studies CBC: Recent Labs  Lab 05/29/22 0035  WBC 10.1  NEUTROABS 7.6  HGB 12.8  HCT 39.3  MCV 92.0  PLT 230   Basic Metabolic Panel: Recent Labs  Lab 05/29/22 0035 05/30/22 0841  NA 135 141  K 3.2* 4.0  CL 105 111  CO2 17* 25  GLUCOSE 117* 103*  BUN 16 16  CREATININE 0.61 0.62  CALCIUM 9.1 9.0  MG 1.9  --    GFR: Estimated Creatinine Clearance: 85.9 mL/min (by C-G formula based on SCr of 0.62 mg/dL). Liver Function Tests: Recent Labs  Lab 05/29/22 0035  AST 40  ALT 28  ALKPHOS 89  BILITOT 0.4  PROT 7.7  ALBUMIN 4.1   Recent Labs  Lab 05/29/22 0035  LIPASE 36   Recent Labs  Lab 05/29/22 0036 05/29/22 0334  LATICACIDVEN  2.2* 1.6  No results found for this or any previous visit (from the past 240 hour(s)).  Antimicrobials: Anti-infectives (From admission, onward)    Start     Dose/Rate Route Frequency Ordered Stop   05/29/22 1100  ciprofloxacin (CIPRO) tablet 500 mg        500 mg Oral 2 times daily 05/29/22 1039     05/29/22 1100  doxycycline (VIBRA-TABS) tablet 100 mg        100 mg Oral Every 12 hours 05/29/22 1039     05/29/22 1100  metroNIDAZOLE (FLAGYL) tablet 500 mg        500 mg Oral 2 times daily 05/29/22 1039        Culture/Microbiology    Component Value Date/Time   SDES PERITONEAL 05/13/2022 1357   SDES PERITONEAL 05/13/2022 1357   SPECREQUEST NONE 05/13/2022 1357   SPECREQUEST NONE 05/13/2022 1357   CULT  05/13/2022 1357    NO GROWTH 6 DAYS Performed at [facility], [address] [place], [place] [address]    REPTSTATUS 05/19/2022 FINAL 05/13/2022 1357   REPTSTATUS 05/13/2022 FINAL 05/13/2022 1357  Other culture-see note  Radiology Studies: US  Pelvis Complete  Result Date: 05/29/2022 CLINICAL DATA:  Initial evaluation for acute pelvic pain. EXAM: TRANSABDOMINAL AND TRANSVAGINAL ULTRASOUND OF PELVIS DOPPLER ULTRASOUND OF OVARIES TECHNIQUE: Both transabdominal and transvaginal ultrasound examinations of the pelvis were performed. Transabdominal technique was performed for global imaging of the pelvis including uterus, ovaries, adnexal regions, and pelvic cul-de-sac. It was necessary to proceed with endovaginal exam following the transabdominal exam to visualize the endometrium. Color and duplex Doppler ultrasound was utilized to evaluate blood flow to the ovaries. COMPARISON:  CT from earlier the same day. FINDINGS: Uterus Measurements: 10.1 x 4.7 x 6.0 cm = volume: 148.0 mL. Uterus is anteverted. No discrete fibroid. C-section scar noted. Multiple nabothian cysts noted about the cervix. Endometrium Thickness: 8 mm.  No focal abnormality visualized. Right ovary Measurements: 4.9 x 3.9 x 3.9 cm = volume: 38.5 mL. 4.0 x 3.4 x 3.5 cm complex hypoechoic cyst with internal reticular echogenicity, most characteristic of a hemorrhagic cyst. No internal vascularity or solid nodularity. Left ovary Measurements: 2.8 x 2.1 x 2.8 cm = volume: 8.4 mL. Normal appearance/no adnexal mass. Pulsed Doppler evaluation of both ovaries demonstrates normal low-resistance arterial and venous waveforms. Other findings Small volume free fluid within the pelvis. IMPRESSION: 1. 4 cm complex right ovarian cyst, most characteristic of a benign hemorrhagic cyst. Associated small volume free fluid within the pelvis. 2. No evidence for ovarian torsion or other acute finding. Electronically Signed   By: Rise Mu M.D.   On: 05/29/2022 04:57   US Transvaginal Non-OB  Result Date: 05/29/2022 CLINICAL DATA:  Initial evaluation for acute pelvic pain. EXAM: TRANSABDOMINAL AND TRANSVAGINAL ULTRASOUND OF PELVIS DOPPLER ULTRASOUND OF OVARIES TECHNIQUE: Both transabdominal and  transvaginal ultrasound examinations of the pelvis were performed. Transabdominal technique was performed for global imaging of the pelvis including uterus, ovaries, adnexal regions, and pelvic cul-de-sac. It was necessary to proceed with endovaginal exam following the transabdominal exam to visualize the endometrium. Color and duplex Doppler ultrasound was utilized to evaluate blood flow to the ovaries. COMPARISON:  CT from earlier the same day. FINDINGS: Uterus Measurements: 10.1 x 4.7 x 6.0 cm = volume: 148.0 mL. Uterus is anteverted. No discrete fibroid. C-section scar noted. Multiple nabothian cysts noted about the cervix. Endometrium Thickness: 8 mm.  No focal abnormality visualized. Right ovary Measurements: 4.9 x  3.9 x 3.9 cm = volume: 38.5 mL. 4.0 x 3.4 x 3.5 cm complex hypoechoic cyst with internal reticular echogenicity, most characteristic of a hemorrhagic cyst. No internal vascularity or solid nodularity. Left ovary Measurements: 2.8 x 2.1 x 2.8 cm = volume: 8.4 mL. Normal appearance/no adnexal mass. Pulsed Doppler evaluation of both ovaries demonstrates normal low-resistance arterial and venous waveforms. Other findings Small volume free fluid within the pelvis. IMPRESSION: 1. 4 cm complex right ovarian cyst, most characteristic of a benign hemorrhagic cyst. Associated small volume free fluid within the pelvis. 2. No evidence for ovarian torsion or other acute finding. Electronically Signed   By: Rise Mu M.D.   On: 05/29/2022 04:57   [place] Art/Ven Flow Abd Pelv Doppler  Result Date: 05/29/2022 CLINICAL DATA:  Initial evaluation for acute pelvic pain. EXAM: TRANSABDOMINAL AND TRANSVAGINAL ULTRASOUND OF PELVIS DOPPLER ULTRASOUND OF OVARIES TECHNIQUE: Both transabdominal and transvaginal ultrasound examinations of the pelvis were performed. Transabdominal technique was performed for global imaging of the pelvis including uterus, ovaries, adnexal regions, and pelvic cul-de-sac. It was  necessary to proceed with endovaginal exam following the transabdominal exam to visualize the endometrium. Color and duplex Doppler ultrasound was utilized to evaluate blood flow to the ovaries. COMPARISON:  CT from earlier the same day. FINDINGS: Uterus Measurements: 10.1 x 4.7 x 6.0 cm = volume: 148.0 mL. Uterus is anteverted. No discrete fibroid. C-section scar noted. Multiple nabothian cysts noted about the cervix. Endometrium Thickness: 8 mm.  No focal abnormality visualized. Right ovary Measurements: 4.9 x 3.9 x 3.9 cm = volume: 38.5 mL. 4.0 x 3.4 x 3.5 cm complex hypoechoic cyst with internal reticular echogenicity, most characteristic of a hemorrhagic cyst. No internal vascularity or solid nodularity. Left ovary Measurements: 2.8 x 2.1 x 2.8 cm = volume: 8.4 mL. Normal appearance/no adnexal mass. Pulsed Doppler evaluation of both ovaries demonstrates normal low-resistance arterial and venous waveforms. Other findings Small volume free fluid within the pelvis. IMPRESSION: 1. 4 cm complex right ovarian cyst, most characteristic of a benign hemorrhagic cyst. Associated small volume free fluid within the pelvis. 2. No evidence for ovarian torsion or other acute finding. Electronically Signed   By: Rise Mu M.D.   On: 05/29/2022 04:57   CT ABDOMEN PELVIS W CONTRAST  Result Date: 05/29/2022 CLINICAL DATA:  Abdominal pain, acute, nonlocalized. Nausea, vomiting. Illicit drug use, altered mental status. EXAM: CT ABDOMEN AND PELVIS WITH CONTRAST TECHNIQUE: Multidetector CT imaging of the abdomen and pelvis was performed using the standard protocol following bolus administration of intravenous contrast. RADIATION DOSE REDUCTION: This exam was performed according to the departmental dose-optimization program which includes automated exposure control, adjustment of the mA and/or kV according to patient size and/or use of iterative reconstruction technique. CONTRAST:  OMNIPAQUE IOHEXOL 300 MG/ML   SOLN COMPARISON:  05/10/2022 FINDINGS: Lower chest: No acute abnormality. Hepatobiliary: Stable tiny cyst within the subserosal left hepatic lobe. Mild hepatic steatosis. No enhancing intrahepatic mass. No intra or extrahepatic biliary ductal dilation. Gallbladder unremarkable. Pancreas: Unremarkable Spleen: Unremarkable Adrenals/Urinary Tract: Adrenal glands are unremarkable. Kidneys are normal, without renal calculi, focal lesion, or hydronephrosis. Bladder is unremarkable. Stomach/Bowel: Stomach is within normal limits. Appendix appears normal. No evidence of bowel wall thickening, distention, or inflammatory changes. Vascular/Lymphatic: No significant vascular findings are present. No enlarged abdominal or pelvic lymph nodes. Reproductive: Inflammatory changes within the adnexa bilaterally have improved in the interval with resolution of previously noted left pelvic fluid collections. 3.8 cm right adnexal cystic lesion has developed  demonstrating a thin enhancing internal septation which may represent a follicular cyst, but is not well characterized on this examination. Uterus unremarkable. Other: No abdominal wall hernia. Musculoskeletal: No acute bone abnormality. No lytic or blastic bone lesion. IMPRESSION: 1. No acute intra-abdominal pathology identified. No definite radiographic explanation for the patient's reported symptoms. 2. Interval improvement in bilateral adnexal inflammatory changes related to pelvic inflammatory disease and resolution of previously noted left pelvic fluid collections. Interval development of a 3.8 cm right adnexal cystic lesion. This may represent a follicular cyst but is not well characterized on this examination. This would be better assessed with dedicated pelvic sonography. 3. Mild hepatic steatosis. Electronically Signed   By: Helyn Numbers M.D.   On: 05/29/2022 03:37     LOS: 0 days   Lanae Boast, MD [facility]  05/30/2022, 1:42 PM

## 2022-05-30 NOTE — Hospital Course (Addendum)
36 y.o.f w anxiety disorder, liver cirrhosis, alcohol abuse, cocaine abuse, hypertension, unspecified renal disorder, history of other nonhemorrhagic CVA, ventral hernia, constipation who has been recently admitted and discharged twice in the past month for hypertensive emergency and PID-left AMA on 05/13/2022 and did not finish PID treatment in the hospital-left with oral ciprofloxacin and metronidazole to take at home. Followed up with her PCP who referred to the ED since she was complaining of significant pelvic pain, nausea and emesis> left without being seen due to long wait time in the ED and apparently drank alcohol and used cocaine which she believes was laced with an unknown substance. She was found unresponsive by family members and had to be given naloxone In ED-Labs with ketonuria UDS was positive for cocaine and benzodiazepines.  CBC showed a white count 10.1, hemoglobin 12.9 g/dL platelets 230.  Lipase was normal.  Lactic acid was 2.2 then 1.6 mmol/L.  Magnesium 1.9 , CMP showed a potassium of 3.2 and CO2 of 17 mmol/L with a normal anion gap.  The rest of the electrolytes were normal,Hepatic and renal functions were normal. vital signs were temperature 97.6 F, pulse 100, respiration 15, BP 168/116 mmHg O2 sat 96% on room air.  The patient received fentanyl 50 mcg IV, hydromorphone 1 mg IVP x2 LR 1000 mL bolus, magnesium sulfate 2 g IVPB, ondansetron 4 mg IVP x2 and I added KCl 40 mEq every 4 hours x 2 doses. CT abdomen/pelvis with contrast with no acute intra-abdominal pathology.  There was interval improvement of bilateral adnexal inflammatory changes related to PID and resolution of previously noted left pelvic fluid collections.  There is an interval development of a 3.8 cm right adnexal cystic lesion.  There is mild hepatic steatosis.  Transabdominal and transvaginal US of pelvis showed a 4 cm complex right ovarian cyst likely characteristic of a benign hemorrhagic cyst which is associated  with small volume free fluid within the pelvis.  There is no evidence for ovarian torsion or other acute finding. Patient was admitted for observation overnight continued on Cipro and doxycycline, placed on CIWA Ativan for alcohol dependence. At this time patient remains stable alert oriented x3, blood pressure stabilized with amlodipine, she will be discharged home with antihypertensives and antibiotics with instruction for follow-up to GYN and PCP

## 2022-05-31 MED ORDER — DOXYCYCLINE HYCLATE 100 MG PO TABS: 100.0000 mg | ORAL_TABLET | Freq: Two times a day (BID) | ORAL | 0 refills | Status: AC

## 2022-05-31 MED ORDER — CLOPIDOGREL BISULFATE 75 MG PO TABS: 75.0000 mg | ORAL_TABLET | Freq: Every day | ORAL | 2 refills | Status: AC

## 2022-05-31 MED ORDER — AMLODIPINE BESYLATE 10 MG PO TABS: 10.0000 mg | ORAL_TABLET | Freq: Every day | ORAL | 0 refills | Status: DC

## 2022-05-31 MED ORDER — ATORVASTATIN CALCIUM 40 MG PO TABS: 40.0000 mg | ORAL_TABLET | Freq: Every day | ORAL | 0 refills | Status: DC

## 2022-05-31 MED ORDER — CIPROFLOXACIN HCL 500 MG PO TABS: 500.0000 mg | ORAL_TABLET | Freq: Two times a day (BID) | ORAL | 0 refills | Status: AC

## 2022-05-31 NOTE — TOC Transition Note (Signed)
Transition of Care [facility]) - CM/SW Discharge Note   Patient Details  Name: [name] MRN: [id number] Date of Birth: Dec 26, 1985  [facility] CM/SW Contact:  Vassie Moselle, LCSW Phone Number: 05/31/2022, 1:19 PM   Clinical Narrative:    Met with pt to discuss SA resources. Pt minimizes use and states the only reason for OD was that someone lied to her about what she was taking. Pt initially declined any resources however, agreed to have resources added to her discharge paperwork.  Pt reports she does not have money to pay for her medications and is agreeable to using [facility]. Pt is aware that there will be a $3 cost for each medication and reports she will be able to afford this.  CSW spoke with pt's mother over the phone who is agreeable to discharge plan and would like to be called when pt is ready to be picked up as she is parked outside and will need to drive to the entrance.    Final next level of care: Home/Self Care Barriers to Discharge: No Barriers Identified   Patient Goals and CMS Choice Patient states their goals for this hospitalization and ongoing recovery are:: To go home CMS Medicare.gov Compare Post Acute Care list provided to:: Patient Choice offered to / list presented to : Patient  Discharge Placement                       Discharge Plan and Services In-house Referral: NA Discharge Planning Services: [facility], CM Consult Post Acute Care Choice: NA          DME Arranged: N/A DME Agency: NA                  Social Determinants of Health ([place]) Interventions     Readmission Risk Interventions    05/31/2022    1:07 PM 05/13/2022   10:08 AM 05/08/2022   11:43 AM  Readmission Risk Prevention Plan  Post Dischage Appt   Complete  Transportation Screening Complete Complete Complete  PCP or Specialist Appt within 5-7 Days Patient refused    PCP or Specialist Appt within 3-5 Days  Complete   Home Care Screening Complete     Medication Review (RN CM) Complete    HRI or Home Care Consult  Complete   Social Work Consult for [place] Planning/Counseling  Complete   Palliative Care Screening  Not Applicable   Medication Review [employer])  Complete

## 2022-05-31 NOTE — Discharge Summary (Signed)
Physician Discharge Summary  [name] JXB:[id number] DOB: 1986-07-10 DOA: 05/28/2022  PCP: Patient, No Pcp Per  Admit date: 05/28/2022 Discharge date: 05/31/2022 Recommendations for Outpatient Follow-up:  Follow up with PCP in 1 weeks-call for appointment You need to follow-up with neurology, OB/GYN-Per care previous hospitalization in September Please obtain BMP/CBC in one week  Discharge Dispo: home Discharge Condition: Stable Code Status:   Code Status: Full Code Diet recommendation:  Diet Order             Diet Heart Room service appropriate? Yes; Fluid consistency: Thin  Diet effective now                    Brief/Interim Summary:  36 y.o.f w anxiety disorder, liver cirrhosis, alcohol abuse, cocaine abuse, hypertension, unspecified renal disorder, history of other nonhemorrhagic CVA, ventral hernia, constipation who has been recently admitted and discharged twice in the past month for hypertensive emergency and PID-left AMA on 05/13/2022 and did not finish PID treatment in the hospital-left with oral ciprofloxacin and metronidazole to take at home. Followed up with her PCP who referred to the ED since she was complaining of significant pelvic pain, nausea and emesis> left without being seen due to long wait time in the ED and apparently drank alcohol and used cocaine which she believes was laced with an unknown substance. She was found unresponsive by family members and had to be given naloxone In ED-Labs with ketonuria UDS was positive for cocaine and benzodiazepines.  CBC showed a white count 10.1, hemoglobin 12.9 g/dL platelets 130.  Lipase was normal.  Lactic acid was 2.2 then 1.6 mmol/L.  Magnesium 1.9 , CMP showed a potassium of 3.2 and CO2 of 17 mmol/L with a normal anion gap.  The rest of the electrolytes were normal,Hepatic and renal functions were normal. vital signs were temperature 97.6 F, pulse 100, respiration 15, BP 168/116 mmHg O2 sat 96% on room air.  The  patient received fentanyl 50 mcg IV, hydromorphone 1 mg IVP x2 LR 1000 mL bolus, magnesium sulfate 2 g IVPB, ondansetron 4 mg IVP x2 and I added KCl 40 mEq every 4 hours x 2 doses. CT abdomen/pelvis with contrast with no acute intra-abdominal pathology.  There was interval improvement of bilateral adnexal inflammatory changes related to PID and resolution of previously noted left pelvic fluid collections.  There is an interval development of a 3.8 cm right adnexal cystic lesion.  There is mild hepatic steatosis.  Transabdominal and transvaginal US of pelvis showed a 4 cm complex right ovarian cyst likely characteristic of a benign hemorrhagic cyst which is associated with small volume free fluid within the pelvis.  There is no evidence for ovarian torsion or other acute finding. Patient was admitted for observation overnight continued on Cipro and doxycycline, placed on CIWA Ativan for alcohol dependence. At this time patient remains stable alert oriented x3, blood pressure stabilized with amlodipine, she will be discharged home with antihypertensives and antibiotics with instruction for follow-up to GYN and PCP   Discharge Diagnoses:  Principal Problem:   Abdominal pain Active Problems:   Hypertension   Cocaine abuse (HCC)   Alcohol dependence (HCC)   Hypokalemia   PID (acute pelvic inflammatory disease)   Tobacco abuse   Hyperlipidemia   Intractable pain  Abdominal pain appears chronic Recent PID Right ovarian cyst-appears to be likely benign hemorrhagic cyst: Labs no leukocytosis no fever CT abdomen pelvis no acute abdominal pathology improvement of adnexal inflammatory changes related  to PID and resolution of previously noted left pelvic fluid, noted  adnexal cystic lesion appears to be benign hemorrhagic cyst, no ovarian torsion.  Patient will complete her Cipro Doxy and Flagyl per previous hospitalization.  No more pain.  I provided OB/GYN Doc number fo [id number] up from previous  hospitalization   Mild lactic acidosis likely dehydration induced, resolved. Alcohol dependence placed on CIWA protocol,Folate thiamine MV.  No signs of withdrawal, last 4 CIWA scores 0-1   History of CVA /with severe intracranial stenosis-she is supposed to be on DAPT x90 days, statin.  Again I prescribed this medication- TOC provided MATCH letter. Hypertension: BP now controlled after changing medication, off clonidine continue amlodipine  blood.   Hyperlipidemia on Lipitor Cocaine abuse: Advised cessation TOC consulted-vital resources Tobacco abuse nicotine patch PRN Documented previously patient has been requesting pain meds despite being drowsy, minimize opiates due to risks associated. Hypokalemia repleted Class I obesity with BMI 32: Will benefit with weight loss healthy lifestyle PCP follow-up Mood is stable, denies suicidal ideation homicidal ideation. He is alert awake oriented x4, Requesting for discharge home today.  Consults: none Subjective: Alert awake, ambulatory on room air no new complaints  Discharge Exam: Vitals:   05/30/22 2334 05/31/22 0701  BP: (!) 151/88 (!) 158/96  Pulse: (!) 55 70  Resp:    Temp: 98.5 F (36.9 C) 98.5 F (36.9 C)  SpO2: 97% 98%   General: Pt is alert, awake, not in acute distress Cardiovascular: RRR, S1/S2 +, no rubs, no gallops Respiratory: CTA bilaterally, no wheezing, no rhonchi Abdominal: Soft, NT, ND, bowel sounds + Extremities: no edema, no cyanosis  Discharge Instructions  Discharge Instructions     Discharge instructions   Complete by: As directed    Please call call MD or return to ER for similar or worsening recurring problem that brought you to hospital or if any fever,nausea/vomiting,abdominal pain, uncontrolled pain, chest pain,  shortness of breath or any other alarming symptoms.  Please follow-up with neurology, primary care doctor and you need to see GYN doctor too   Please follow-up your doctor as  instructed in a week time and call the office for appointment.  Please avoid alcohol, smoking, or any other illicit substance and maintain healthy habits including taking your regular medications as prescribed.  You were cared for by a hospitalist during your hospital stay. If you have any questions about your discharge medications or the care you received while you were in the hospital after you are discharged, you can call the unit and ask to speak with the hospitalist on call if the hospitalist that took care of you is not available.  Once you are discharged, your primary care physician will handle any further medical issues. Please note that NO REFILLS for any discharge medications will be authorized once you are discharged, as it is imperative that you return to your primary care physician (or establish a relationship with a primary care physician if you do not have one) for your aftercare needs so that they can reassess your need for medications and monitor your lab values   Increase activity slowly   Complete by: As directed    No wound care   Complete by: As directed       Allergies as of 05/31/2022       Reactions   Ibuprofen Anaphylaxis, Swelling, Other (See Comments)   Patient thinks it may have caused her throat to "swell" (** PATIENT HAS BEEN TOLERATING THIS IN 2023**)  Latex Shortness Of Breath   Peanut-containing Drug Products Anaphylaxis   Penicillins Anaphylaxis, Hives   Has patient had a PCN reaction causing immediate rash, facial/tongue/throat swelling, SOB or lightheadedness with hypotension: Yes Has patient had a PCN reaction causing severe rash involving mucus membranes or skin necrosis: unknown Has patient had a PCN reaction that required hospitalization No Has patient had a PCN reaction occurring within the last 10 years: No If all of the above answers are "NO", then may proceed with Cephalosporin use. Tolerated ceftriaxone 2018 & 2019   Acetaminophen Other (See  Comments)   Causes patient to have an upset stomach- Tolerating this in 2023 (325 mg tablets)   Codeine Hives, Itching   Lisinopril Swelling   Ativan [lorazepam] Other (See Comments)   "Hallucinations," per patient        Medication List     STOP taking these medications    cloNIDine 0.1 MG tablet Commonly known as: CATAPRES       TAKE these medications    amLODipine 10 MG tablet Commonly known as: NORVASC Take 1 tablet (10 mg total) by mouth daily. Start taking on: June 01, 2022   aspirin EC 81 MG tablet Take 1 tablet (81 mg total) by mouth daily. Swallow whole.   atorvastatin 40 MG tablet Commonly known as: LIPITOR Take 1 tablet (40 mg total) by mouth daily.   ciprofloxacin 500 MG tablet Commonly known as: CIPRO Take 1 tablet (500 mg total) by mouth 2 (two) times daily for 7 days.   clopidogrel 75 MG tablet Commonly known as: PLAVIX Take 1 tablet (75 mg total) by mouth daily.   doxycycline 100 MG tablet Commonly known as: VIBRA-TABS Take 1 tablet (100 mg total) by mouth every 12 (twelve) hours for 7 days.   meloxicam 7.5 MG tablet Commonly known as: MOBIC Take 1 tablet (7.5 mg total) by mouth daily.   metroNIDAZOLE 500 MG tablet Commonly known as: FLAGYL Take 1 tablet (500 mg total) by mouth 2 (two) times daily for 14 days.        Follow-up Information     [facility] Follow up in 1 week(s).   Contact information: [address] [place] [address] [phone]        Lazaro Arms, MD Follow up in 1 week(s).   Specialties: Obstetrics and Gynecology, Radiology Contact information: [address] [place] [address] [phone]                Allergies  Allergen Reactions   Ibuprofen Anaphylaxis, Swelling and Other (See Comments)    Patient thinks it may have caused her throat to "swell"  (** PATIENT HAS BEEN TOLERATING THIS IN 2023**)   Latex Shortness  Of Breath   Peanut-Containing Drug Products Anaphylaxis   Penicillins Anaphylaxis and Hives    Has patient had a PCN reaction causing immediate rash, facial/tongue/throat swelling, SOB or lightheadedness with hypotension: Yes Has patient had a PCN reaction causing severe rash involving mucus membranes or skin necrosis: unknown Has patient had a PCN reaction that required hospitalization No Has patient had a PCN reaction occurring within the last 10 years: No If all of the above answers are "NO", then may proceed with Cephalosporin use. Tolerated ceftriaxone 2018 & 2019    Acetaminophen Other (See Comments)    Causes patient to have an upset stomach- Tolerating this in 2023 (325 mg tablets)   Codeine Hives and Itching  Lisinopril Swelling   Ativan [Lorazepam] Other (See Comments)    "Hallucinations," per patient    The results of significant diagnostics from this hospitalization (including imaging, microbiology, ancillary and laboratory) are listed below for reference.    Microbiology: No results found for this or any previous visit (from the past 240 hour(s)).  Procedures/Studies: US Pelvis Complete  Result Date: 05/29/2022 CLINICAL DATA:  Initial evaluation for acute pelvic pain. EXAM: TRANSABDOMINAL AND TRANSVAGINAL ULTRASOUND OF PELVIS DOPPLER ULTRASOUND OF OVARIES TECHNIQUE: Both transabdominal and transvaginal ultrasound examinations of the pelvis were performed. Transabdominal technique was performed for global imaging of the pelvis including uterus, ovaries, adnexal regions, and pelvic cul-de-sac. It was necessary to proceed with endovaginal exam following the transabdominal exam to visualize the endometrium. Color and duplex Doppler ultrasound was utilized to evaluate blood flow to the ovaries. COMPARISON:  CT from earlier the same day. FINDINGS: Uterus Measurements: 10.1 x 4.7 x 6.0 cm = volume: 148.0 mL. Uterus is anteverted. No discrete fibroid. C-section scar noted. Multiple  nabothian cysts noted about the cervix. Endometrium Thickness: 8 mm.  No focal abnormality visualized. Right ovary Measurements: 4.9 x 3.9 x 3.9 cm = volume: 38.5 mL. 4.0 x 3.4 x 3.5 cm complex hypoechoic cyst with internal reticular echogenicity, most characteristic of a hemorrhagic cyst. No internal vascularity or solid nodularity. Left ovary Measurements: 2.8 x 2.1 x 2.8 cm = volume: 8.4 mL. Normal appearance/no adnexal mass. Pulsed Doppler evaluation of both ovaries demonstrates normal low-resistance arterial and venous waveforms. Other findings Small volume free fluid within the pelvis. IMPRESSION: 1. 4 cm complex right ovarian cyst, most characteristic of a benign hemorrhagic cyst. Associated small volume free fluid within the pelvis. 2. No evidence for ovarian torsion or other acute finding. Electronically Signed   By: Rise Mu M.D.   On: 05/29/2022 04:57   US Transvaginal Non-OB  Result Date: 05/29/2022 CLINICAL DATA:  Initial evaluation for acute pelvic pain. EXAM: TRANSABDOMINAL AND TRANSVAGINAL ULTRASOUND OF PELVIS DOPPLER ULTRASOUND OF OVARIES TECHNIQUE: Both transabdominal and transvaginal ultrasound examinations of the pelvis were performed. Transabdominal technique was performed for global imaging of the pelvis including uterus, ovaries, adnexal regions, and pelvic cul-de-sac. It was necessary to proceed with endovaginal exam following the transabdominal exam to visualize the endometrium. Color and duplex Doppler ultrasound was utilized to evaluate blood flow to the ovaries. COMPARISON:  CT from earlier the same day. FINDINGS: Uterus Measurements: 10.1 x 4.7 x 6.0 cm = volume: 148.0 mL. Uterus is anteverted. No discrete fibroid. C-section scar noted. Multiple nabothian cysts noted about the cervix. Endometrium Thickness: 8 mm.  No focal abnormality visualized. Right ovary Measurements: 4.9 x 3.9 x 3.9 cm = volume: 38.5 mL. 4.0 x 3.4 x 3.5 cm complex hypoechoic cyst with internal  reticular echogenicity, most characteristic of a hemorrhagic cyst. No internal vascularity or solid nodularity. Left ovary Measurements: 2.8 x 2.1 x 2.8 cm = volume: 8.4 mL. Normal appearance/no adnexal mass. Pulsed Doppler evaluation of both ovaries demonstrates normal low-resistance arterial and venous waveforms. Other findings Small volume free fluid within the pelvis. IMPRESSION: 1. 4 cm complex right ovarian cyst, most characteristic of a benign hemorrhagic cyst. Associated small volume free fluid within the pelvis. 2. No evidence for ovarian torsion or other acute finding. Electronically Signed   By: Rise Mu M.D.   On: 05/29/2022 04:57   [place] Art/Ven Flow Abd Pelv Doppler  Result Date: 05/29/2022 CLINICAL DATA:  Initial evaluation for acute pelvic pain. EXAM: TRANSABDOMINAL AND TRANSVAGINAL  ULTRASOUND OF PELVIS DOPPLER ULTRASOUND OF OVARIES TECHNIQUE: Both transabdominal and transvaginal ultrasound examinations of the pelvis were performed. Transabdominal technique was performed for global imaging of the pelvis including uterus, ovaries, adnexal regions, and pelvic cul-de-sac. It was necessary to proceed with endovaginal exam following the transabdominal exam to visualize the endometrium. Color and duplex Doppler ultrasound was utilized to evaluate blood flow to the ovaries. COMPARISON:  CT from earlier the same day. FINDINGS: Uterus Measurements: 10.1 x 4.7 x 6.0 cm = volume: 148.0 mL. Uterus is anteverted. No discrete fibroid. C-section scar noted. Multiple nabothian cysts noted about the cervix. Endometrium Thickness: 8 mm.  No focal abnormality visualized. Right ovary Measurements: 4.9 x 3.9 x 3.9 cm = volume: 38.5 mL. 4.0 x 3.4 x 3.5 cm complex hypoechoic cyst with internal reticular echogenicity, most characteristic of a hemorrhagic cyst. No internal vascularity or solid nodularity. Left ovary Measurements: 2.8 x 2.1 x 2.8 cm = volume: 8.4 mL. Normal appearance/no adnexal mass. Pulsed  Doppler evaluation of both ovaries demonstrates normal low-resistance arterial and venous waveforms. Other findings Small volume free fluid within the pelvis. IMPRESSION: 1. 4 cm complex right ovarian cyst, most characteristic of a benign hemorrhagic cyst. Associated small volume free fluid within the pelvis. 2. No evidence for ovarian torsion or other acute finding. Electronically Signed   By: Rise Mu M.D.   On: 05/29/2022 04:57   CT ABDOMEN PELVIS W CONTRAST  Result Date: 05/29/2022 CLINICAL DATA:  Abdominal pain, acute, nonlocalized. Nausea, vomiting. Illicit drug use, altered mental status. EXAM: CT ABDOMEN AND PELVIS WITH CONTRAST TECHNIQUE: Multidetector CT imaging of the abdomen and pelvis was performed using the standard protocol following bolus administration of intravenous contrast. RADIATION DOSE REDUCTION: This exam was performed according to the departmental dose-optimization program which includes automated exposure control, adjustment of the mA and/or kV according to patient size and/or use of iterative reconstruction technique. CONTRAST:  OMNIPAQUE IOHEXOL 300 MG/ML  SOLN COMPARISON:  05/10/2022 FINDINGS: Lower chest: No acute abnormality. Hepatobiliary: Stable tiny cyst within the subserosal left hepatic lobe. Mild hepatic steatosis. No enhancing intrahepatic mass. No intra or extrahepatic biliary ductal dilation. Gallbladder unremarkable. Pancreas: Unremarkable Spleen: Unremarkable Adrenals/Urinary Tract: Adrenal glands are unremarkable. Kidneys are normal, without renal calculi, focal lesion, or hydronephrosis. Bladder is unremarkable. Stomach/Bowel: Stomach is within normal limits. Appendix appears normal. No evidence of bowel wall thickening, distention, or inflammatory changes. Vascular/Lymphatic: No significant vascular findings are present. No enlarged abdominal or pelvic lymph nodes. Reproductive: Inflammatory changes within the adnexa bilaterally have improved in the  interval with resolution of previously noted left pelvic fluid collections. 3.8 cm right adnexal cystic lesion has developed demonstrating a thin enhancing internal septation which may represent a follicular cyst, but is not well characterized on this examination. Uterus unremarkable. Other: No abdominal wall hernia. Musculoskeletal: No acute bone abnormality. No lytic or blastic bone lesion. IMPRESSION: 1. No acute intra-abdominal pathology identified. No definite radiographic explanation for the patient's reported symptoms. 2. Interval improvement in bilateral adnexal inflammatory changes related to pelvic inflammatory disease and resolution of previously noted left pelvic fluid collections. Interval development of a 3.8 cm right adnexal cystic lesion. This may represent a follicular cyst but is not well characterized on this examination. This would be better assessed with dedicated pelvic sonography. 3. Mild hepatic steatosis. Electronically Signed   By: Helyn Numbers M.D.   On: 05/29/2022 03:37   CT ASPIRATION N/S  Result Date: 05/13/2022 CLINICAL DATA:  Lower abdominal pain, nausea, vomiting.  Left lower quadrant/pelvic fluid collections. EXAM: CT GUIDED ASPIRATION BIOPSY OF LEFT PELVIS ANESTHESIA/SEDATION: Intravenous Fentanyl and Versed 2mg  were administered as conscious sedation during continuous monitoring of the patient's level of consciousness and physiological / cardiorespiratory status by the radiology RN, with a total moderate sedation time of 11 minutes. PROCEDURE: The procedure risks, benefits, and alternatives were explained to the patient. Questions regarding the procedure were encouraged and answered. The patient understands and consents to the procedure. select axial scans through the pelvis were obtained. left cystic collections in the pelvis were localized anteromedial to the psoas musculature. An appropriate skin entry site was determined and marked. The operative field was prepped  with chlorhexidinein a sterile fashion, and a sterile drape was applied covering the operative field. A sterile gown and sterile gloves were used for the procedure. Local anesthesia was provided with 1% Lidocaine. Under CT fluoroscopic guidance, a 15 cm Yueh multi side-hole sheath needle was advanced into the collection. 45 mL of clear yellow fluid were aspirated, sent for Gram stain and culture. Follow-up CT shows virtually complete decompression of the collections. No hemorrhage or other apparent complication. The patient tolerated the procedure well. COMPLICATIONS: None immediate FINDINGS: Thin walled left pelvic cystic collections were localized, stable appearance since 05/10/2022. 45 mL clear yellow fluid was aspirated, virtually completely decompressing the process. No drain was placed. IMPRESSION: 1. Technically successful CT-guided aspiration of left pelvic fluid collections, sample sent for Gram stain and culture. RADIATION DOSE REDUCTION: This exam was performed according to the departmental dose-optimization program which includes automated exposure control, adjustment of the mA and/or kV according to patient size and/or use of iterative reconstruction technique. Electronically Signed   By: Corlis Leak M.D.   On: 05/13/2022 14:46   CT ABDOMEN PELVIS W CONTRAST  Result Date: 05/10/2022 CLINICAL DATA:  Lower abdominal pain, nausea and vomiting. EXAM: CT ABDOMEN AND PELVIS WITH CONTRAST TECHNIQUE: Multidetector CT imaging of the abdomen and pelvis was performed using the standard protocol following bolus administration of intravenous contrast. RADIATION DOSE REDUCTION: This exam was performed according to the departmental dose-optimization program which includes automated exposure control, adjustment of the mA and/or kV according to patient size and/or use of iterative reconstruction technique. CONTRAST:  OMNIPAQUE IOHEXOL 300 MG/ML  SOLN COMPARISON:  CT abdomen pelvis dated 05/04/2022. FINDINGS:  Lower chest: No acute abnormality. Hepatobiliary: There is an 8 mm cyst in the liver. No imaging follow-up is recommended for this finding. No gallstones, gallbladder wall thickening, or biliary dilatation. Pancreas: Unremarkable. No pancreatic ductal dilatation or surrounding inflammatory changes. Spleen: Normal in size without focal abnormality. Adrenals/Urinary Tract: Adrenal glands are unremarkable. Kidneys are normal, without renal calculi, focal lesion, or hydronephrosis. Bladder is unremarkable. Stomach/Bowel: Stomach is within normal limits. Enteric contrast reaches the distal small bowel. The appendix is not definitely seen. No evidence of bowel obstruction. Vascular/Lymphatic: A few periaortic lymph nodes are at the upper limits of normal, measuring 10 mm in short axis (series 2, image 41 and image 49). A mesenteric lymph node is at the upper limits of normal, measuring 10 mm in short axis (series 2, image 53). No significant vascular findings are present. Reproductive: Multiple fluid collections with rim enhancement and associated inflammatory changes in the bilateral adnexa appear similar to prior exam. These inflammatory changes are in close proximity with a loop of small bowel and the sigmoid colon. A fluid collection in the left adnexa without associated rim enhancement has increased in size (4.7 x 2.8 x 3.4  cm on series 2, image 61 and series 4, image 38). A fluid collection near the sigmoid colon without rim enhancement is new since prior exam (2.4 x 3.3 x 2.4 cm on series 2, image 63 and series 4, image 31). Other: No abdominal wall hernia or abnormality. Musculoskeletal: No acute or significant osseous findings. IMPRESSION: 1. Multiple rim enhancing fluid collections with associated inflammatory changes in the bilateral adnexa appear similar to prior exam and two fluid collections without significant rim enhancement are new since 05/04/2022. These findings are favored to reflect pelvic  inflammatory disease as opposed to enteritis as the primary etiology. 2. Inflammatory changes involving a loop of small bowel and the sigmoid colon as well as multiple abdominal lymph nodes at the upper limits of normal are favored to be reactive. Electronically Signed   By: Romona Curls M.D.   On: 05/10/2022 16:06   ECHOCARDIOGRAM LIMITED BUBBLE STUDY  Result Date: 05/07/2022    ECHOCARDIOGRAM LIMITED REPORT   Patient Name:   [name] Date of Exam: 05/07/2022 Medical Rec #:  [id number]          Height:       60.0 in Accession #:    [id number]         Weight:       158.7 lb Date of Birth:  Jul 17, 1986           BSA:          1.692 m Patient Age:    36 years           BP:           135/84 mmHg Patient Gender: F                  HR:           76 bpm. Exam Location:  Inpatient Procedure: Limited Echo and Saline Contrast Bubble Study Indications:    stroke  History:        Patient has prior history of Echocardiogram examinations, most                 recent 05/06/2022. Stroke.  Sonographer:    Cathie Hoops Referring Phys: [id number] Malachi Carl STACK IMPRESSIONS  1. Limited bubble study for stroke  2. Left ventricular ejection fraction, by estimation, is 60 to 65%. The left ventricle has normal function.  3. Agitated saline contrast bubble study was negative, with no evidence of any interatrial shunt. Comparison(s): 05/06/2022: LVEF 60-65%. FINDINGS  Left Ventricle: Left ventricular ejection fraction, by estimation, is 60 to 65%. The left ventricle has normal function. IAS/Shunts: No atrial level shunt detected by color flow Doppler. Agitated saline contrast was given intravenously to evaluate for intracardiac shunting. Agitated saline contrast bubble study was negative, with no evidence of any interatrial shunt. [name] MD Electronically signed by [name] MD Signature Date/Time: 05/07/2022/2:51:35 PM    Final    DG Pelvis 1-2 Views  Result Date: 05/07/2022 CLINICAL DATA:  Trauma, fall EXAM: PELVIS -  1-2 VIEW COMPARISON:  None Available. FINDINGS: No fracture or dislocation is seen.  SI joints are symmetrical. IMPRESSION: No fracture or dislocation is seen. Electronically Signed   By: Ernie Avena M.D.   On: 05/07/2022 09:36   CT ANGIO HEAD W OR WO CONTRAST  Result Date: 05/06/2022 CLINICAL DATA:  Stroke follow-up. EXAM: CT ANGIOGRAPHY HEAD AND NECK TECHNIQUE: Multidetector CT imaging of the head and neck was performed using the standard protocol during bolus  administration of intravenous contrast. Multiplanar CT image reconstructions and MIPs were obtained to evaluate the vascular anatomy. Carotid stenosis measurements (when applicable) are obtained utilizing NASCET criteria, using the distal internal carotid diameter as the denominator. RADIATION DOSE REDUCTION: This exam was performed according to the departmental dose-optimization program which includes automated exposure control, adjustment of the mA and/or kV according to patient size and/or use of iterative reconstruction technique. CONTRAST:  9mL OMNIPAQUE IOHEXOL 350 MG/ML SOLN COMPARISON:  Head and neck CTA 03/21/2020 FINDINGS: CTA NECK FINDINGS Aortic arch: Standard 3 vessel aortic arch. Widely patent arch vessel origins. Right carotid system: Patent and smooth without evidence of stenosis or dissection. Tortuous proximal ICA. Left carotid system: Patent and smooth without evidence of stenosis or dissection. Tortuous mid cervical ICA. Vertebral arteries: Patent, smooth, and codominant without evidence of stenosis or dissection. Skeleton: Widespread dental caries and periapical lucencies. Other neck: No evidence of cervical lymphadenopathy or mass. Upper chest: Clear lung apices. Review of the MIP images confirms the above findings CTA HEAD FINDINGS Anterior circulation: The internal carotid arteries are widely patent from skull base to carotid termini. ACAs and MCAs are patent without evidence of a proximal branch occlusion or significant  proximal stenosis. There is a new severe distal left A2 stenosis. No aneurysm is identified. Posterior circulation: The intracranial vertebral arteries are widely patent to the basilar. Patent PICA, AICA, and SCA origins are seen bilaterally. The basilar artery is widely patent. There is a large left posterior communicating artery with absent left P1 segment. Both PCAs are patent without evidence of a significant proximal stenosis. No aneurysm is identified. Venous sinuses: Patent. Anatomic variants: Fetal left PCA. Review of the MIP images confirms the above findings IMPRESSION: 1. No large vessel occlusion or significant proximal stenosis in the head or neck. 2. New severe distal left A2 stenosis. Electronically Signed   By: Logan Bores M.D.   On: 05/06/2022 18:17   CT ANGIO NECK W OR WO CONTRAST  Result Date: 05/06/2022 CLINICAL DATA:  Stroke follow-up. EXAM: CT ANGIOGRAPHY HEAD AND NECK TECHNIQUE: Multidetector CT imaging of the head and neck was performed using the standard protocol during bolus administration of intravenous contrast. Multiplanar CT image reconstructions and MIPs were obtained to evaluate the vascular anatomy. Carotid stenosis measurements (when applicable) are obtained utilizing NASCET criteria, using the distal internal carotid diameter as the denominator. RADIATION DOSE REDUCTION: This exam was performed according to the departmental dose-optimization program which includes automated exposure control, adjustment of the mA and/or kV according to patient size and/or use of iterative reconstruction technique. CONTRAST:  38mL OMNIPAQUE IOHEXOL 350 MG/ML SOLN COMPARISON:  Head and neck CTA 03/21/2020 FINDINGS: CTA NECK FINDINGS Aortic arch: Standard 3 vessel aortic arch. Widely patent arch vessel origins. Right carotid system: Patent and smooth without evidence of stenosis or dissection. Tortuous proximal ICA. Left carotid system: Patent and smooth without evidence of stenosis or dissection.  Tortuous mid cervical ICA. Vertebral arteries: Patent, smooth, and codominant without evidence of stenosis or dissection. Skeleton: Widespread dental caries and periapical lucencies. Other neck: No evidence of cervical lymphadenopathy or mass. Upper chest: Clear lung apices. Review of the MIP images confirms the above findings CTA HEAD FINDINGS Anterior circulation: The internal carotid arteries are widely patent from skull base to carotid termini. ACAs and MCAs are patent without evidence of a proximal branch occlusion or significant proximal stenosis. There is a new severe distal left A2 stenosis. No aneurysm is identified. Posterior circulation: The intracranial vertebral arteries are  widely patent to the basilar. Patent PICA, AICA, and SCA origins are seen bilaterally. The basilar artery is widely patent. There is a large left posterior communicating artery with absent left P1 segment. Both PCAs are patent without evidence of a significant proximal stenosis. No aneurysm is identified. Venous sinuses: Patent. Anatomic variants: Fetal left PCA. Review of the MIP images confirms the above findings IMPRESSION: 1. No large vessel occlusion or significant proximal stenosis in the head or neck. 2. New severe distal left A2 stenosis. Electronically Signed   By: Sebastian Ache M.D.   On: 05/06/2022 18:17   ECHOCARDIOGRAM COMPLETE  Result Date: 05/06/2022    ECHOCARDIOGRAM REPORT   Patient Name:   [name] Date of Exam: 05/06/2022 Medical Rec #:  [id number]          Height:       60.0 in Accession #:    [id number]         Weight:       158.7 lb Date of Birth:  [birth year]           BSA:          1.692 m Patient Age:    36 years           BP:           117/77 mmHg Patient Gender: F                  HR:           80 bpm. Exam Location:  Inpatient Procedure: 2D Echo, Cardiac Doppler and Color Doppler Indications:    Stroke  History:        Patient has prior history of Echocardiogram examinations, most                  recent 03/21/2020. Risk Factors:Hypertension.  Sonographer:    Eduard Roux Referring Phys: [id number] Daylene Katayama GHERGHE IMPRESSIONS  1. Left ventricular ejection fraction, by estimation, is 60 to 65%. The left ventricle has normal function. The left ventricle has no regional wall motion abnormalities. There is mild concentric left ventricular hypertrophy. Left ventricular diastolic parameters are consistent with Grade II diastolic dysfunction (pseudonormalization).  2. Right ventricular systolic function is normal. The right ventricular size is normal. There is normal pulmonary artery systolic pressure. The estimated right ventricular systolic pressure is 16.2 mmHg.  3. Left atrial size was mildly dilated.  4. The mitral valve is normal in structure. No evidence of mitral valve regurgitation. No evidence of mitral stenosis.  5. The aortic valve is tricuspid. Aortic valve regurgitation is not visualized. No aortic stenosis is present.  6. The inferior vena cava is normal in size with greater than 50% respiratory variability, suggesting right atrial pressure of 3 mmHg. FINDINGS  Left Ventricle: Left ventricular ejection fraction, by estimation, is 60 to 65%. The left ventricle has normal function. The left ventricle has no regional wall motion abnormalities. The left ventricular internal cavity size was normal in size. There is  mild concentric left ventricular hypertrophy. Left ventricular diastolic parameters are consistent with Grade II diastolic dysfunction (pseudonormalization). Right Ventricle: The right ventricular size is normal. No increase in right ventricular wall thickness. Right ventricular systolic function is normal. There is normal pulmonary artery systolic pressure. The tricuspid regurgitant velocity is 1.82 m/s, and  with an assumed right atrial pressure of 3 mmHg, the estimated right ventricular systolic pressure is 16.2 mmHg. Left Atrium: Left atrial size was mildly dilated. Right  Atrium: Right atrial  size was normal in size. Pericardium: Trivial pericardial effusion is present. Mitral Valve: The mitral valve is normal in structure. No evidence of mitral valve regurgitation. No evidence of mitral valve stenosis. Tricuspid Valve: The tricuspid valve is normal in structure. Tricuspid valve regurgitation is trivial. Aortic Valve: The aortic valve is tricuspid. Aortic valve regurgitation is not visualized. No aortic stenosis is present. Aortic valve peak gradient measures 10.4 mmHg. Pulmonic Valve: The pulmonic valve was normal in structure. Pulmonic valve regurgitation is trivial. Aorta: The aortic root is normal in size and structure. Venous: The inferior vena cava is normal in size with greater than 50% respiratory variability, suggesting right atrial pressure of 3 mmHg. IAS/Shunts: No atrial level shunt detected by color flow Doppler.  LEFT VENTRICLE PLAX 2D LVIDd:         4.30 cm   Diastology LVIDs:         2.90 cm   LV e' medial:    5.63 cm/s LV PW:         1.50 cm   LV E/e' medial:  17.8 LV IVS:        1.50 cm   LV e' lateral:   7.51 cm/s LVOT diam:     1.90 cm   LV E/e' lateral: 13.3 LV SV:         75 LV SV Index:   45 LVOT Area:     2.84 cm  RIGHT VENTRICLE             IVC RV Basal diam:  2.90 cm     IVC diam: 1.50 cm RV S prime:     13.00 cm/s TAPSE (M-mode): 2.5 cm LEFT ATRIUM             Index        RIGHT ATRIUM           Index LA diam:        3.10 cm 1.83 cm/m   RA Area:     15.40 cm LA Vol (A2C):   53.7 ml 31.74 ml/m  RA Volume:   38.90 ml  22.99 ml/m LA Vol (A4C):   62.7 ml 37.06 ml/m LA Biplane Vol: 59.3 ml 35.05 ml/m  AORTIC VALVE                 PULMONIC VALVE AV Area (Vmax): 2.40 cm     PV Vmax:       0.75 m/s AV Vmax:        161.00 cm/s  PV Peak grad:  2.2 mmHg AV Peak Grad:   10.4 mmHg LVOT Vmax:      136.00 cm/s LVOT Vmean:     99.100 cm/s LVOT VTI:       0.266 m  AORTA Ao Root diam: 3.40 cm Ao Asc diam:  3.30 cm MITRAL VALVE                TRICUSPID VALVE MV Area (PHT): 3.06 cm     TR  Peak grad:   13.2 mmHg MV Decel Time: 248 msec     TR Vmax:        182.00 cm/s MV E velocity: 100.00 cm/s MV A velocity: 87.50 cm/s   SHUNTS MV E/A ratio:  1.14         Systemic VTI:  0.27 m  Systemic Diam: 1.90 cm Dalton McleanMD Electronically signed by Wilfred Lacy Signature Date/Time: 05/06/2022/5:45:29 PM    Final    MR BRAIN WO CONTRAST  Result Date: 05/06/2022 CLINICAL DATA:  Acute neuro deficit. EXAM: MRI HEAD WITHOUT CONTRAST TECHNIQUE: Multiplanar, multiecho pulse sequences of the brain and surrounding structures were obtained without intravenous contrast. COMPARISON:  CT head 05/06/2022.  MRI head 03/20/2020 FINDINGS: Brain: Multiple areas of restricted diffusion compatible with acute infarct. These are all small areas of infarct involving the right pons, left putamen, right thalamus, corpus callosum, and the left frontal white matter. Chronic infarcts in the pons especially on the right. Chronic lacunar infarcts in the thalamus bilaterally. Chronic infarcts in the basal ganglia bilaterally. Multiple foci of chronic hemorrhage involving the pons. Chronic microhemorrhage in the right thalamus and right putamen. No hydrocephalus or mass lesion. Vascular: Normal arterial flow voids Skull and upper cervical spine: Negative Sinuses/Orbits: Mild mucosal edema paranasal sinuses. Negative orbit Other: None IMPRESSION: Numerous small areas of acute infarct as described above. Correlate with recent history of hypertension or hypotension. Cerebral emboli possible however no cortical infarct is seen as would be expected with emboli. Advanced for age chronic microvascular ischemia and multiple areas of chronic microhemorrhage. Correlate with risk factors for small vessel disease. Electronically Signed   By: Marlan Palau M.D.   On: 05/06/2022 14:51   CT HEAD WO CONTRAST ( )  Result Date: 05/06/2022 CLINICAL DATA:  Patient is unable to move the right foot this morning. History of  a prior stroke on each side EXAM: CT HEAD WITHOUT CONTRAST TECHNIQUE: Contiguous axial images were obtained from the base of the skull through the vertex without intravenous contrast. RADIATION DOSE REDUCTION: This exam was performed according to the departmental dose-optimization program which includes automated exposure control, adjustment of the mA and/or kV according to patient size and/or use of iterative reconstruction technique. COMPARISON:  CT brain 03/20/2020, MRI head 03/20/2020 FINDINGS: Brain: Chronic left thalamic and left lentiform nucleus lacunar infarcts. There is also a chronic infarct in the right thalamus. Compared to prior exams there is an age indeterminate hypodensity in the left caudate head (series 2, image 15). No evidence of hydrocephalus. No hemorrhage. No extra-axial fluid collections. Vascular: No hyperdense vessel or unexpected calcification. Skull: Normal. Negative for fracture or focal lesion. Sinuses/Orbits: No acute finding. Other: None. IMPRESSION: Compared to prior exam there is a new hypodensity in the left caudate head. Although this has a chronic appearance, it was not visualized on prior imaging and is technically age indeterminate. If there is clinical concern for an acute infarct, this could be further assessed with a brain MRI. Electronically Signed   By: Lorenza Cambridge M.D.   On: 05/06/2022 12:01   CT Angio Abd/Pel w/ and/or w/o  Result Date: 05/04/2022 CLINICAL DATA:  Mid and lower abdominal pain. Evaluate for acute mesenteric ischemia. EXAM: CTA ABDOMEN AND PELVIS WITHOUT AND WITH CONTRAST TECHNIQUE: Multidetector CT imaging of the abdomen and pelvis was performed using the standard protocol during bolus administration of intravenous contrast. Multiplanar reconstructed images and MIPs were obtained and reviewed to evaluate the vascular anatomy. RADIATION DOSE REDUCTION: This exam was performed according to the departmental dose-optimization program which includes  automated exposure control, adjustment of the mA and/or kV according to patient size and/or use of iterative reconstruction technique. CONTRAST:  OMNIPAQUE IOHEXOL 350 MG/ML SOLN COMPARISON:  CT abdomen and pelvis  05/01/2022 and 08/04/2018 FINDINGS: VASCULAR Aorta: Normal caliber aorta without aneurysm, dissection, vasculitis or  significant stenosis. Celiac: Patent without evidence of aneurysm, dissection, vasculitis or significant stenosis. Variant anatomy with only 2 main branches coming off the celiac trunk. The 2 main branches are the left gastric artery and the splenic artery. Common hepatic artery is coming off the left gastric artery in the expected location of an accessory left gastric artery. There is a small gastroduodenal artery. SMA: Patent without evidence of aneurysm, dissection, vasculitis or significant stenosis. Renals: Both renal arteries are patent without evidence of aneurysm, dissection, vasculitis, fibromuscular dysplasia or significant stenosis. IMA: Patent without evidence of aneurysm, dissection, vasculitis or significant stenosis. Inflow: Patent without evidence of aneurysm, dissection, vasculitis or significant stenosis. Proximal Outflow: Bilateral common femoral and visualized portions of the superficial and profunda femoral arteries are patent without evidence of aneurysm, dissection, vasculitis or significant stenosis. Veins: Portal veins and main mesenteric veins are patent without filling defects. IVC and renal veins are patent. No gross abnormality to the iliac veins. Review of the MIP images confirms the above findings. NON-VASCULAR Lower chest: Mild dependent changes in the posterior right lower lobe. Otherwise, the lung bases are clear. Hepatobiliary: 8 mm hypodensity in the anterior liver is probably an incidental finding. Gallbladder is decompressed. No biliary dilatation. Pancreas: Unremarkable. No pancreatic ductal dilatation or surrounding inflammatory changes. Spleen:  Normal in size without focal abnormality. Adrenals/Urinary Tract: Normal adrenal glands. Normal appearance of both kidneys hydronephrosis. 6 mm hypodensity in the right kidney lower pole on sequence 4, image 114 is too small to definitively characterize but likely represents a cyst. This structure does not require dedicated follow-up. No suspicious renal lesions. Mild wall thickening in the urinary bladder. Stomach/Bowel: Normal appearance of the stomach. Question a small hiatal hernia. New fluid and inflammatory changes in the lower abdomen and in the pelvis. Inflammatory changes are centered around uterus and adnexal structures and near loops of small bowel in the lower abdomen. Mildly dilated loops of small bowel in the lower abdomen, largest measuring up to 3.3 cm. Inflammatory changes are also the near the sigmoid colon. Appendix is not confidently identified. Lymphatic: Mildly prominent retroperitoneal lymph nodes. Index lymph node between the aorta and IVC on sequence 11 image 100 measures 1.2 cm in the short axis and this lymph node measured approximately 0.7 cm on 08/04/2018. No significant lymph node enlargement in the pelvis. Reproductive: Fluid and stranding throughout the pelvis around the uterus and adnexal structures. No discrete adnexal lesion. Other: There is extensive stranding with a small amount of fluid throughout the lower abdomen and pelvis. Evidence for developing fluid collections in the pelvis. A collection along the left side of the pelvis on sequence 11 image 194 measures 4.8 x 2.7 cm. Evidence for small complex fluid collections in the pre rectal region on image 194. There is additional complex fluid between the uterus and right adnexal structures on image 172. Musculoskeletal: No acute bone abnormality. IMPRESSION: VASCULAR 1. Arterial structures are widely patent without significant atherosclerotic disease. No evidence for arterial stenosis. 2. Variant celiac artery anatomy as  described. NON-VASCULAR 1. 1. New fluid and inflammatory changes in the lower abdomen and pelvis. These inflammatory changes are near the uterus, adnexa and loops of bowel. Inflammation source is unclear. Differential diagnosis includes enteritis, colitis and possibly pelvic inflammatory disease. Dilatation of some small bowel loops suggests that small bowel could be the inflammatory source. 2. New small complex fluid collections in the pelvis. Early or developing small abscess collections cannot be excluded. Recommend short-term follow-up CT to evaluate these  fluid collections. 3. Slightly enlarged retroperitoneal lymph nodes as described. Findings are nonspecific but could be reactive. Recommend attention to these on follow-up imaging. 4. Mild wall thickening in the urinary bladder is nonspecific due to incomplete distension and could be also be related to surrounding inflammatory changes. Electronically Signed   By: Richarda Overlie M.D.   On: 05/04/2022 14:19   US Pelvis Complete  Result Date: 05/01/2022 CLINICAL DATA:  Pelvic pain EXAM: TRANSABDOMINAL AND TRANSVAGINAL ULTRASOUND OF PELVIS DOPPLER ULTRASOUND OF OVARIES TECHNIQUE: Both transabdominal and transvaginal ultrasound examinations of the pelvis were performed. Transabdominal technique was performed for global imaging of the pelvis including uterus, ovaries, adnexal regions, and pelvic cul-de-sac. It was necessary to proceed with endovaginal exam following the transabdominal exam to visualize the endometrium and ovaries. Color and duplex Doppler ultrasound was utilized to evaluate blood flow to the ovaries. COMPARISON:  CT abdomen 05/01/2022 FINDINGS: Uterus Measurements: 9.4 x 5 x 5.8 cm = volume: 140.8 mL. No fibroids or other mass visualized. Endometrium Thickness: 10 mm.  No focal abnormality visualized. Right ovary Measurements: 3.3 x 2.3 x 3.1 cm = volume: 12 mL. Normal appearance/no adnexal mass. Left ovary Measurements: 3 x 2.5 x 3.1 cm = volume: 12  mL. Normal appearance/no adnexal mass. Pulsed Doppler evaluation of both ovaries demonstrates normal low-resistance arterial and venous waveforms. Other findings No abnormal free fluid. IMPRESSION: 1. Normal pelvic ultrasound. 2. No sonographic findings to explain the patient's pelvic pain. Electronically Signed   By: Elige Ko M.D.   On: 05/01/2022 14:30   US Transvaginal Non-OB  Result Date: 05/01/2022 CLINICAL DATA:  Pelvic pain EXAM: TRANSABDOMINAL AND TRANSVAGINAL ULTRASOUND OF PELVIS DOPPLER ULTRASOUND OF OVARIES TECHNIQUE: Both transabdominal and transvaginal ultrasound examinations of the pelvis were performed. Transabdominal technique was performed for global imaging of the pelvis including uterus, ovaries, adnexal regions, and pelvic cul-de-sac. It was necessary to proceed with endovaginal exam following the transabdominal exam to visualize the endometrium and ovaries. Color and duplex Doppler ultrasound was utilized to evaluate blood flow to the ovaries. COMPARISON:  CT abdomen 05/01/2022 FINDINGS: Uterus Measurements: 9.4 x 5 x 5.8 cm = volume: 140.8 mL. No fibroids or other mass visualized. Endometrium Thickness: 10 mm.  No focal abnormality visualized. Right ovary Measurements: 3.3 x 2.3 x 3.1 cm = volume: 12 mL. Normal appearance/no adnexal mass. Left ovary Measurements: 3 x 2.5 x 3.1 cm = volume: 12 mL. Normal appearance/no adnexal mass. Pulsed Doppler evaluation of both ovaries demonstrates normal low-resistance arterial and venous waveforms. Other findings No abnormal free fluid. IMPRESSION: 1. Normal pelvic ultrasound. 2. No sonographic findings to explain the patient's pelvic pain. Electronically Signed   By: Elige Ko M.D.   On: 05/01/2022 14:30   [place] Art/Ven Flow Abd Pelv Doppler  Result Date: 05/01/2022 CLINICAL DATA:  Pelvic pain EXAM: TRANSABDOMINAL AND TRANSVAGINAL ULTRASOUND OF PELVIS DOPPLER ULTRASOUND OF OVARIES TECHNIQUE: Both transabdominal and transvaginal ultrasound  examinations of the pelvis were performed. Transabdominal technique was performed for global imaging of the pelvis including uterus, ovaries, adnexal regions, and pelvic cul-de-sac. It was necessary to proceed with endovaginal exam following the transabdominal exam to visualize the endometrium and ovaries. Color and duplex Doppler ultrasound was utilized to evaluate blood flow to the ovaries. COMPARISON:  CT abdomen 05/01/2022 FINDINGS: Uterus Measurements: 9.4 x 5 x 5.8 cm = volume: 140.8 mL. No fibroids or other mass visualized. Endometrium Thickness: 10 mm.  No focal abnormality visualized. Right ovary Measurements: 3.3 x 2.3 x 3.1 cm =  volume: 12 mL. Normal appearance/no adnexal mass. Left ovary Measurements: 3 x 2.5 x 3.1 cm = volume: 12 mL. Normal appearance/no adnexal mass. Pulsed Doppler evaluation of both ovaries demonstrates normal low-resistance arterial and venous waveforms. Other findings No abnormal free fluid. IMPRESSION: 1. Normal pelvic ultrasound. 2. No sonographic findings to explain the patient's pelvic pain. Electronically Signed   By: Elige Ko M.D.   On: 05/01/2022 14:30    Labs: BNP (last 3 results) No results for input(s): "BNP" in the last 8760 hours. Basic Metabolic Panel: Recent Labs  Lab 05/29/22 0035 05/30/22 0841  NA 135 141  K 3.2* 4.0  CL 105 111  CO2 17* 25  GLUCOSE 117* 103*  BUN 16 16  CREATININE 0.61 0.62  CALCIUM 9.1 9.0  MG 1.9  --    Liver Function Tests: Recent Labs  Lab 05/29/22 0035  AST 40  ALT 28  ALKPHOS 89  BILITOT 0.4  PROT 7.7  ALBUMIN 4.1   Recent Labs  Lab 05/29/22 0035  LIPASE 36   No results for input(s): "AMMONIA" in the last 168 hours. CBC: Recent Labs  Lab 05/29/22 0035  WBC 10.1  NEUTROABS 7.6  HGB 12.8  HCT 39.3  MCV 92.0  PLT 230   Cardiac Enzymes: No results for input(s): "CKTOTAL", "CKMB", "CKMBINDEX", "TROPONINI" in the last 168 hours. BNP: Invalid input(s): "POCBNP" CBG: No results for input(s):  "GLUCAP" in the last 168 hours. D-Dimer No results for input(s): "DDIMER" in the last 72 hours. Hgb A1c No results for input(s): "HGBA1C" in the last 72 hours. Lipid Profile No results for input(s): "CHOL", "HDL", "LDLCALC", "TRIG", "CHOLHDL", "LDLDIRECT" in the last 72 hours. Thyroid function studies No results for input(s): "TSH", "T4TOTAL", "T3FREE", "THYROIDAB" in the last 72 hours.  Invalid input(s): "FREET3" Anemia work up No results for input(s): "VITAMINB12", "FOLATE", "FERRITIN", "TIBC", "IRON", "RETICCTPCT" in the last 72 hours. Urinalysis    Component Value Date/Time   COLORURINE YELLOW 05/29/2022 0416   APPEARANCEUR CLOUDY (A) 05/29/2022 0416   LABSPEC <1.005 (L) 05/29/2022 0416   PHURINE 6.0 05/29/2022 0416   GLUCOSEU NEGATIVE 05/29/2022 0416   HGBUR NEGATIVE 05/29/2022 0416   BILIRUBINUR NEGATIVE 05/29/2022 0416   KETONESUR 5 (A) 05/29/2022 0416   PROTEINUR NEGATIVE 05/29/2022 0416   UROBILINOGEN 0.2 02/23/2013 1447   NITRITE NEGATIVE 05/29/2022 0416   LEUKOCYTESUR NEGATIVE 05/29/2022 0416   Sepsis Labs Recent Labs  Lab 05/29/22 0035  WBC 10.1   Microbiology No results found for this or any previous visit (from the past 240 hour(s)).   Time coordinating discharge: 25 minutes  SIGNED: Lanae Boast, MD  [facility] 05/31/2022, 1:36 PM  If 7PM-7AM, please contact night-coverage [email or web address]

## 2022-05-31 NOTE — Plan of Care (Signed)

## 2022-05-31 NOTE — Progress Notes (Signed)
Mobility Specialist Cancellation/Refusal Note:    05/31/22 2956  Mobility  Activity Refused mobility    Reason for Cancellation/Refusal: Pt declined mobility at this time. Pt just took pain meds & wants to wait before ambulating. Will check back as schedule permits.       [facility]

## 2022-05-31 NOTE — Progress Notes (Signed)
Mobility Specialist Cancellation/Refusal Note:    05/31/22 1318  Mobility  Activity Refused mobility     Reason for Cancellation/Refusal: Pt declined mobility at this time. Pt states she is getting discharged. Will check back as schedule permits.   [facility]

## 2022-06-02 ENCOUNTER — Other Ambulatory Visit (HOSPITAL_COMMUNITY): Payer: Self-pay

## 2022-06-03 ENCOUNTER — Ambulatory Visit: Payer: Self-pay | Admitting: Gastroenterology

## 2022-06-08 ENCOUNTER — Ambulatory Visit: Payer: Self-pay | Admitting: Neurology

## 2022-06-08 ENCOUNTER — Encounter: Payer: Self-pay | Admitting: Neurology

## 2022-07-03 ENCOUNTER — Other Ambulatory Visit: Payer: Self-pay | Admitting: Nurse Practitioner

## 2022-07-03 DIAGNOSIS — R1084 Generalized abdominal pain: Secondary | ICD-10-CM

## 2022-07-22 ENCOUNTER — Other Ambulatory Visit: Payer: Self-pay

## 2022-07-22 ENCOUNTER — Encounter: Payer: Self-pay | Admitting: Obstetrics & Gynecology

## 2022-07-23 ENCOUNTER — Other Ambulatory Visit: Payer: Self-pay

## 2022-08-04 ENCOUNTER — Encounter: Payer: Self-pay | Admitting: Family Medicine

## 2022-08-26 ENCOUNTER — Ambulatory Visit: Payer: Medicaid Other | Admitting: Nurse Practitioner

## 2022-08-28 ENCOUNTER — Ambulatory Visit: Payer: Self-pay | Admitting: Nurse Practitioner

## 2022-09-07 ENCOUNTER — Ambulatory Visit (INDEPENDENT_AMBULATORY_CARE_PROVIDER_SITE_OTHER): Payer: Medicaid Other | Admitting: Nurse Practitioner

## 2022-09-07 ENCOUNTER — Encounter: Payer: Self-pay | Admitting: Nurse Practitioner

## 2022-09-07 VITALS — BP 151/77 | HR 65 | Temp 97.7°F | Resp 19 | Ht 58.5 in | Wt 166.4 lb

## 2022-09-07 DIAGNOSIS — R9389 Abnormal findings on diagnostic imaging of other specified body structures: Secondary | ICD-10-CM

## 2022-09-07 DIAGNOSIS — R1084 Generalized abdominal pain: Secondary | ICD-10-CM

## 2022-09-07 DIAGNOSIS — N730 Acute parametritis and pelvic cellulitis: Secondary | ICD-10-CM

## 2022-09-07 DIAGNOSIS — I639 Cerebral infarction, unspecified: Secondary | ICD-10-CM

## 2022-09-07 DIAGNOSIS — F419 Anxiety disorder, unspecified: Secondary | ICD-10-CM

## 2022-09-07 DIAGNOSIS — F1910 Other psychoactive substance abuse, uncomplicated: Secondary | ICD-10-CM

## 2022-09-07 MED ORDER — CLONIDINE HCL 0.1 MG PO TABS: 0.1000 mg | ORAL_TABLET | Freq: Two times a day (BID) | ORAL | 2 refills | Status: DC

## 2022-09-07 MED ORDER — PANTOPRAZOLE SODIUM 40 MG PO TBEC: 40.0000 mg | DELAYED_RELEASE_TABLET | Freq: Every day | ORAL | 0 refills | Status: DC

## 2022-09-07 MED ORDER — CLOPIDOGREL BISULFATE 75 MG PO TABS: 75.0000 mg | ORAL_TABLET | Freq: Every day | ORAL | 2 refills | Status: DC

## 2022-09-07 MED ORDER — ONDANSETRON HCL 8 MG PO TABS: 8.0000 mg | ORAL_TABLET | Freq: Three times a day (TID) | ORAL | 2 refills | Status: DC | PRN

## 2022-09-07 MED ORDER — ATORVASTATIN CALCIUM 40 MG PO TABS: 40.0000 mg | ORAL_TABLET | Freq: Every day | ORAL | 0 refills | Status: DC

## 2022-09-07 MED ORDER — ESCITALOPRAM OXALATE 10 MG PO TABS: 10.0000 mg | ORAL_TABLET | Freq: Every day | ORAL | 0 refills | Status: DC

## 2022-09-07 NOTE — Assessment & Plan Note (Signed)
-  Ambulatory referral to Neurology - CBC - Comprehensive metabolic panel - Drug Screen 11 w/Conf, Ser  2. Abnormal CT scan  - Ambulatory referral to Gastroenterology - Ambulatory referral to Obstetrics / Gynecology  3. Generalized abdominal pain  - Ambulatory referral to Gastroenterology - Ambulatory referral to Obstetrics / Gynecology  4. PID (acute pelvic inflammatory disease)  - Ambulatory referral to Obstetrics / Gynecology  5. Substance abuse ([place])  - Drug Screen 11 w/Conf, Ser  6. Anxiety  - escitalopram (LEXAPRO) 10 MG tablet; Take 1 tablet (10 mg total) by mouth daily.  Dispense: 30 tablet; Refill: 0  Follow up:  Follow up in 1 month

## 2022-09-07 NOTE — Patient Instructions (Signed)
1. Acute CVA (cerebrovascular accident) [facility])  - Ambulatory referral to Neurology - CBC - Comprehensive metabolic panel - Drug Screen 11 w/Conf, Ser  2. Abnormal CT scan  - Ambulatory referral to Gastroenterology - Ambulatory referral to Obstetrics / Gynecology  3. Generalized abdominal pain  - Ambulatory referral to Gastroenterology - Ambulatory referral to Obstetrics / Gynecology  4. PID (acute pelvic inflammatory disease)  - Ambulatory referral to Obstetrics / Gynecology  5. Substance abuse ([place])  - Drug Screen 11 w/Conf, Ser  6. Anxiety  - escitalopram (LEXAPRO) 10 MG tablet; Take 1 tablet (10 mg total) by mouth daily.  Dispense: 30 tablet; Refill: 0  Follow up:  Follow up in 1 month

## 2022-09-07 NOTE — Progress Notes (Signed)
@Patient  ID: [name], female    DOB: 1986-01-09, 37 y.o.   MRN: [id number]  Chief Complaint  Patient presents with   Hypertension    3 month fu. Abdominal pain in bottom right  side x 5 months it is constant and feels sharp pain and has anxiety x years but its getting worse. Is in need of refills    Referring provider: No ref. provider found   HPI  37 y.o.f w/ history significant of CVA, cirrhosis of the liver, HTN, cocaine abuse, EtOH abuse    Patient presents today for a follow-up visit.  She was in the hospital in October for accidental drug overdose.  Patient has not followed up with either referrals placed at last visit.  Will place referrals again today.  Patient states that she has ongoing anxiety.  We will start her on low-dose Lexapro.  Counseling was offered but patient refuses at this time. Denies f/c/s, n/v/d, hemoptysis, PND, leg swelling Denies chest pain or edema     Allergies  Allergen Reactions   Ibuprofen Anaphylaxis, Swelling and Other (See Comments)    Patient thinks it may have caused her throat to "swell"  (** PATIENT HAS BEEN TOLERATING THIS IN 2023**)   Latex Shortness Of Breath   Peanut-Containing Drug Products Anaphylaxis   Penicillins Anaphylaxis and Hives    Has patient had a PCN reaction causing immediate rash, facial/tongue/throat swelling, SOB or lightheadedness with hypotension: Yes Has patient had a PCN reaction causing severe rash involving mucus membranes or skin necrosis: unknown Has patient had a PCN reaction that required hospitalization No Has patient had a PCN reaction occurring within the last 10 years: No If all of the above answers are "NO", then may proceed with Cephalosporin use. Tolerated ceftriaxone 2018 & 2019    Acetaminophen Other (See Comments)    Causes patient to have an upset stomach- Tolerating this in 2023 (325 mg tablets)   Codeine Hives and Itching   Lisinopril Swelling   Ativan [Lorazepam] Other (See  Comments)    "Hallucinations," per patient    Immunization History  Administered Date(s) Administered   Influenza,inj,Quad PF,6+ Mos 06/17/2015, 09/23/2016, 08/03/2017, 05/30/2022   Pneumococcal Polysaccharide-23 08/04/2017   Tdap 07/30/2013, 05/08/2016    Past Medical History:  Diagnosis Date   Anxiety    Cirrhosis of liver ([place])    Cocaine use 08/04/2018   History of cocaine use 08/04/2018   Hypertension    Polysubstance abuse ([place])    Renal disorder    Kidney Infection    Stroke ([place])    x12    Tobacco History: Social History   Tobacco Use  Smoking Status Every Day   Packs/day: 0.50   Types: Cigarettes  Smokeless Tobacco Never   Ready to quit: No Counseling given: Yes   Outpatient Encounter Medications as of 09/07/2022  Medication Sig   escitalopram (LEXAPRO) 10 MG tablet Take 1 tablet (10 mg total) by mouth daily.   [DISCONTINUED] cloNIDine (CATAPRES) 0.1 MG tablet Take 0.1 mg by mouth 2 (two) times daily.   [DISCONTINUED] clopidogrel (PLAVIX) 75 MG tablet Take 75 mg by mouth daily.   atorvastatin (LIPITOR) 40 MG tablet Take 1 tablet (40 mg total) by mouth daily.   cloNIDine (CATAPRES) 0.1 MG tablet Take 1 tablet (0.1 mg total) by mouth 2 (two) times daily.   clopidogrel (PLAVIX) 75 MG tablet Take 1 tablet (75 mg total) by mouth daily.   ondansetron (ZOFRAN) 8 MG tablet Take 1 tablet (  8 mg total) by mouth every 8 (eight) hours as needed for nausea or vomiting.   pantoprazole (PROTONIX) 40 MG tablet Take 1 tablet (40 mg total) by mouth daily.   [DISCONTINUED] amLODipine (NORVASC) 10 MG tablet Take 1 tablet (10 mg total) by mouth daily.   [DISCONTINUED] aspirin EC 81 MG tablet Take 1 tablet (81 mg total) by mouth daily. Swallow whole. (Patient not taking: Reported on 05/29/2022)   [DISCONTINUED] atorvastatin (LIPITOR) 40 MG tablet Take 1 tablet (40 mg total) by mouth daily. (Patient not taking: Reported on 09/07/2022)   [DISCONTINUED] meloxicam (MOBIC) 7.5 MG tablet  Take 1 tablet (7.5 mg total) by mouth daily.   [DISCONTINUED] ondansetron (ZOFRAN) 8 MG tablet Take by mouth every 8 (eight) hours as needed for nausea or vomiting. (Patient not taking: Reported on 09/07/2022)   [DISCONTINUED] pantoprazole (PROTONIX) 40 MG tablet Take 40 mg by mouth daily. (Patient not taking: Reported on 09/07/2022)   No facility-administered encounter medications on file as of 09/07/2022.     Review of Systems  Review of Systems  Constitutional: Negative.   HENT: Negative.    Cardiovascular: Negative.   Gastrointestinal: Negative.   Allergic/Immunologic: Negative.   Neurological: Negative.   Psychiatric/Behavioral: Negative.         Physical Exam  BP (!) 151/77   Pulse 65   Temp 97.7 F (36.5 C) (Temporal)   Resp 19   Ht 4' 10.5" (1.486 m)   Wt 166 lb 6.4 oz (75.5 kg)   SpO2 98%   BMI 34.19 kg/m   Wt Readings from Last 5 Encounters:  09/07/22 166 lb 6.4 oz (75.5 kg)  05/30/22 164 lb 0.4 oz (74.4 kg)  05/28/22 146 lb 4 oz (66.3 kg)  05/12/22 158 lb 11.7 oz (72 kg)  05/01/22 158 lb 11.7 oz (72 kg)     Physical Exam Vitals and nursing note reviewed.  Constitutional:      General: She is not in acute distress.    Appearance: She is well-developed.  Cardiovascular:     Rate and Rhythm: Normal rate and regular rhythm.  Pulmonary:     Effort: Pulmonary effort is normal.     Breath sounds: Normal breath sounds.  Neurological:     Mental Status: She is alert and oriented to person, place, and time.      Lab Results:  CBC    Component Value Date/Time   WBC 10.1 05/29/2022 0035   RBC 4.27 05/29/2022 0035   HGB 12.8 05/29/2022 0035   HCT 39.3 05/29/2022 0035   PLT 230 05/29/2022 0035   MCV 92.0 05/29/2022 0035   MCH 30.0 05/29/2022 0035   MCHC 32.6 05/29/2022 0035   RDW 15.6 (H) 05/29/2022 0035   LYMPHSABS 1.6 05/29/2022 0035   MONOABS 0.8 05/29/2022 0035   EOSABS 0.1 05/29/2022 0035   BASOSABS 0.0 05/29/2022 0035    BMET     Component Value Date/Time   NA 141 05/30/2022 0841   K 4.0 05/30/2022 0841   CL 111 05/30/2022 0841   CO2 25 05/30/2022 0841   GLUCOSE 103 (H) 05/30/2022 0841   BUN 16 05/30/2022 0841   CREATININE 0.62 05/30/2022 0841   CREATININE 0.69 09/23/2016 1442   CALCIUM 9.0 05/30/2022 0841   GFRNONAA >60 05/30/2022 0841   GFRNONAA >89 09/23/2016 1442   GFRAA >60 05/09/2020 0524   GFRAA >89 09/23/2016 1442      Assessment & Plan:   Acute CVA (cerebrovascular accident) [facility]) - Ambulatory referral  to Neurology - CBC - Comprehensive metabolic panel - Drug Screen 11 w/Conf, Ser  2. Abnormal CT scan  - Ambulatory referral to Gastroenterology - Ambulatory referral to Obstetrics / Gynecology  3. Generalized abdominal pain  - Ambulatory referral to Gastroenterology - Ambulatory referral to Obstetrics / Gynecology  4. PID (acute pelvic inflammatory disease)  - Ambulatory referral to Obstetrics / Gynecology  5. Substance abuse ([facility])  - Drug Screen 11 w/Conf, Ser  6. Anxiety  - escitalopram (LEXAPRO) 10 MG tablet; Take 1 tablet (10 mg total) by mouth daily.  Dispense: 30 tablet; Refill: 0  Follow up:  Follow up in 1 month     Ivonne Andrew, NP 09/07/2022

## 2022-09-11 LAB — COMPREHENSIVE METABOLIC PANEL
ALT: 17 IU/L (ref 0–32)
AST: 19 IU/L (ref 0–40)
Albumin/Globulin Ratio: 1.7 (ref 1.2–2.2)
Albumin: 4.1 g/dL (ref 3.9–4.9)
Alkaline Phosphatase: 101 IU/L (ref 44–121)
BUN/Creatinine Ratio: 21 (ref 9–23)
BUN: 13 mg/dL (ref 6–20)
Bilirubin Total: <0.2 mg/dL (ref 0.0–1.2)
CO2: 22 mmol/L (ref 20–29)
Calcium: 9.1 mg/dL (ref 8.7–10.2)
Chloride: 104 mmol/L (ref 96–106)
Creatinine, Ser: 0.61 mg/dL (ref 0.57–1.00)
Globulin, Total: 2.4 g/dL (ref 1.5–4.5)
Glucose: 101 mg/dL — ABNORMAL HIGH (ref 70–99)
Potassium: 4.2 mmol/L (ref 3.5–5.2)
Sodium: 139 mmol/L (ref 134–144)
Total Protein: 6.5 g/dL (ref 6.0–8.5)
eGFR: 117 mL/min/{1.73_m2} (ref >59)

## 2022-09-11 LAB — DRUG SCREEN 11 W/CONF, SE
Amphetamines, IA: NEGATIVE ng/mL
Barbiturates, IA: NEGATIVE ug/mL
Benzodiazepines, IA: NEGATIVE ng/mL
Cocaine & Metabolite, IA: NEGATIVE ng/mL
Ethyl Alcohol, Enz: POSITIVE gm/dL — AB
Methadone, IA: NEGATIVE ng/mL
Opiates, IA: NEGATIVE ng/mL
Oxycodones, IA: NEGATIVE ng/mL
Phencyclidine, IA: NEGATIVE ng/mL
Propoxyphene, IA: NEGATIVE ng/mL
THC(Marijuana) Metabolite, IA: NEGATIVE ng/mL

## 2022-09-11 LAB — ETHYL ALCOHOL,GC,WB/SP RFX
Ethyl Alcohol Confirmation: POSITIVE
Ethyl Alcohol: 0.046 gm/dL

## 2022-09-11 LAB — CBC
Hematocrit: 36.3 % (ref 34.0–46.6)
Hemoglobin: 12.2 g/dL (ref 11.1–15.9)
MCH: 28.8 pg (ref 26.6–33.0)
MCHC: 33.6 g/dL (ref 31.5–35.7)
MCV: 86 fL (ref 79–97)
Platelets: 261 10*3/uL (ref 150–450)
RBC: 4.24 x10E6/uL (ref 3.77–5.28)
RDW: 13.9 % (ref 11.7–15.4)
WBC: 5.5 10*3/uL (ref 3.4–10.8)

## 2022-09-14 ENCOUNTER — Encounter: Payer: Self-pay | Admitting: Neurology

## 2022-09-14 ENCOUNTER — Ambulatory Visit: Payer: Medicaid Other | Admitting: Neurology

## 2022-09-14 VITALS — BP 146/86 | HR 67 | Ht 59.0 in | Wt 161.8 lb

## 2022-09-14 DIAGNOSIS — I999 Unspecified disorder of circulatory system: Secondary | ICD-10-CM | POA: Diagnosis not present

## 2022-09-14 DIAGNOSIS — E669 Obesity, unspecified: Secondary | ICD-10-CM

## 2022-09-14 DIAGNOSIS — I6381 Other cerebral infarction due to occlusion or stenosis of small artery: Secondary | ICD-10-CM

## 2022-09-14 DIAGNOSIS — F14988 Cocaine use, unspecified with other cocaine-induced disorder: Secondary | ICD-10-CM

## 2022-09-14 NOTE — Patient Instructions (Addendum)
I had a long d/w patient about his recent stroke, risk for recurrent stroke/TIAs, personally independently reviewed imaging studies and stroke evaluation results and answered questions.Continue Plavix (clopidogrel ) 75 mg daily  for secondary stroke prevention and maintain strict control of hypertension with blood pressure goal below 130/90, diabetes with hemoglobin A1c goal below 6.5% and lipids with LDL cholesterol goal below 70 mg/dL. I also advised the patient to eat a healthy diet with plenty of whole grains, cereals, fruits and vegetables, exercise regularly and maintain ideal body weight.  Refer for outpatient physical and Occupational Therapy.  Check outpatient arterial Doppler bubble study for PFO.  Patient counseled to quit smoking cigarettes, drinking alcohol and using cocaine and voiced understanding.  Followup in the future with my nurse practitioner in 3 months or call earlier if necessary.  Stroke Prevention Some medical conditions and behaviors can lead to a higher chance of having a stroke. You can help prevent a stroke by eating healthy, exercising, not smoking, and managing any medical conditions you have. Stroke is a leading cause of functional impairment. Primary prevention is particularly important because a majority of strokes are first-time events. Stroke changes the lives of not only those who experience a stroke but also their family and other caregivers. How can this condition affect me? A stroke is a medical emergency and should be treated right away. A stroke can lead to brain damage and can sometimes be life-threatening. If a person gets medical treatment right away, there is a better chance of surviving and recovering from a stroke. What can increase my risk? The following medical conditions may increase your risk of a stroke: Cardiovascular disease. High blood pressure (hypertension). Diabetes. High cholesterol. Sickle cell disease. Blood clotting disorders  (hypercoagulable state). Obesity. Sleep disorders (obstructive sleep apnea). Other risk factors include: Being older than age 69. Having a history of blood clots, stroke, or mini-stroke (transient ischemic attack, TIA). Genetic factors, such as race, ethnicity, or a family history of stroke. Smoking cigarettes or using other tobacco products. Taking birth control pills, especially if you also use tobacco. Heavy use of alcohol or drugs, especially cocaine and methamphetamine. Physical inactivity. What actions can I take to prevent this? Manage your health conditions High cholesterol levels. Eating a healthy diet is important for preventing high cholesterol. If cholesterol cannot be managed through diet alone, you may need to take medicines. Take any prescribed medicines to control your cholesterol as told by your health care provider. Hypertension. To reduce your risk of stroke, try to keep your blood pressure below 130/80. Eating a healthy diet and exercising regularly are important for controlling blood pressure. If these steps are not enough to manage your blood pressure, you may need to take medicines. Take any prescribed medicines to control hypertension as told by your health care provider. Ask your health care provider if you should monitor your blood pressure at home. Have your blood pressure checked every year, even if your blood pressure is normal. Blood pressure increases with age and some medical conditions. Diabetes. Eating a healthy diet and exercising regularly are important parts of managing your blood sugar (glucose). If your blood sugar cannot be managed through diet and exercise, you may need to take medicines. Take any prescribed medicines to control your diabetes as told by your health care provider. Get evaluated for obstructive sleep apnea. Talk to your health care provider about getting a sleep evaluation if you snore a lot or have excessive sleepiness. Make sure that  any  other medical conditions you have, such as atrial fibrillation or atherosclerosis, are managed. Nutrition Follow instructions from your health care provider about what to eat or drink to help manage your health condition. These instructions may include: Reducing your daily calorie intake. Limiting how much salt (sodium) you use to 1,500 milligrams (mg) each day. Using only healthy fats for cooking, such as olive oil, canola oil, or sunflower oil. Eating healthy foods. You can do this by: Choosing foods that are high in fiber, such as whole grains, and fresh fruits and vegetables. Eating at least 5 servings of fruits and vegetables a day. Try to fill one-half of your plate with fruits and vegetables at each meal. Choosing lean protein foods, such as lean cuts of meat, poultry without skin, fish, tofu, beans, and nuts. Eating low-fat dairy products. Avoiding foods that are high in sodium. This can help lower blood pressure. Avoiding foods that have saturated fat, trans fat, and cholesterol. This can help prevent high cholesterol. Avoiding processed and prepared foods. Counting your daily carbohydrate intake.  Lifestyle If you drink alcohol: Limit how much you have to: 0-1 drink a day for women who are not pregnant. 0-2 drinks a day for men. Know how much alcohol is in your drink. In the [place] one drink equals one 12 oz bottle of beer ( ), one 5 oz glass of wine ( ), or one 1 oz glass of hard liquor (79mL). Do not use any products that contain nicotine or tobacco. These products include cigarettes, chewing tobacco, and vaping devices, such as e-cigarettes. If you need help quitting, ask your health care provider. Avoid secondhand smoke. Do not use drugs. Activity  Try to stay at a healthy weight. Get at least 30 minutes of exercise on most days, such as: Fast walking. Biking. Swimming. Medicines Take over-the-counter and prescription medicines only as told by your health  care provider. Aspirin or blood thinners (antiplatelets or anticoagulants) may be recommended to reduce your risk of forming blood clots that can lead to stroke. Avoid taking birth control pills. Talk to your health care provider about the risks of taking birth control pills if: You are over 51 years old. You smoke. You get very bad headaches. You have had a blood clot. Where to find more information American Stroke Association: www.strokeassociation.org Get help right away if: You or a loved one has any symptoms of a stroke. "BE FAST" is an easy way to remember the main warning signs of a stroke: B - Balance. Signs are dizziness, sudden trouble walking, or loss of balance. E - Eyes. Signs are trouble seeing or a sudden change in vision. F - Face. Signs are sudden weakness or numbness of the face, or the face or eyelid drooping on one side. A - Arms. Signs are weakness or numbness in an arm. This happens suddenly and usually on one side of the body. S - Speech. Signs are sudden trouble speaking, slurred speech, or trouble understanding what people say. T - Time. Time to call emergency services. Write down what time symptoms started. You or a loved one has other signs of a stroke, such as: A sudden, severe headache with no known cause. Nausea or vomiting. Seizure. These symptoms may represent a serious problem that is an emergency. Do not wait to see if the symptoms will go away. Get medical help right away. Call your local emergency services (911 in the [place] Do not drive yourself to the hospital. Summary You can help to prevent a  stroke by eating healthy, exercising, not smoking, limiting alcohol intake, and managing any medical conditions you may have. Do not use any products that contain nicotine or tobacco. These include cigarettes, chewing tobacco, and vaping devices, such as e-cigarettes. If you need help quitting, ask your health care provider. Remember "BE FAST" for warning signs of  a stroke. Get help right away if you or a loved one has any of these signs. This information is not intended to replace advice given to you by your health care provider. Make sure you discuss any questions you have with your health care provider. Document Revised: 03/11/2020 Document Reviewed: 03/11/2020 Elsevier Patient Education  [place].

## 2022-09-14 NOTE — Progress Notes (Signed)
[facility] [address] [place].  [address] [phone]       OFFICE CONSULT NOTE  [name] Date of Birth:  [birth year] Medical Record Number:  [id number]   Referring OZ:HYQMV Leodis Binet, NP  Reason for Referral: Stroke  HPI: [name] is a 37 year old Caucasian lady with past medical history of hypertension, hyperlipidemia, anxiety, cirrhosis, polysubstance abuse, cocaine abuse, smoking who is seen today for initial office consultation visit.  She is accompanied by her mother.  History is obtained from her and review of electronic medical records.  I have personally reviewed pertinent available imaging films in PACS.  She initially presented on 03/21/2020 1 to 2-week history of numbness in the left hand in the right hand.  MRI showed multiple bilateral thalamic,left corona radiata/internal capsule lacunar infarcts.  Subacute.  He was hypertensive reported abusing cocaine off and on.  She left AGAINST MEDICAL ADVICE prior to completing stroke workup.  She returned on 05/06/2022.  Patient was admitted with abdominal pain suspected abdominal abscess and was on antibiotics awaiting surgery He was found to have right-sided weakness.  MRI scan of the spine showed right pontine, left putamen, right thalamus, corpus callosum and left frontal white matter infarcts.  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  Echocardiogram showed ejection fraction of 60 to 65% without clot.  Hemoglobin A1c was 5.4 and LDL cholesterol was 84 mg percent.  RPR and HIV were negative.  He had known history of noncompliance with medications and follow-up.  She was started on aspirin and Plavix and statin for lipid.  States is done well since abnormal recurrent stroke or TIA symptoms.  She does feel that her walking is not back to baseline.  Also feels right side is weak and sometimes the right leg is tricky and at times gives away for no reason.  She does walk independently without a  cane.  She does not have health insurance she did not seek medical follow-up but now states has since gotten health insurance. ROS:   14 system review of systems is positive for gait difficulty, leg weakness, decreased memory, bruising and all other systems negative   PMH:  Past Medical History:  Diagnosis Date   Anxiety    Cirrhosis of liver (HCC)    Cocaine use 08/04/2018   History of cocaine use 08/04/2018   Hypertension    Polysubstance abuse ([place])    Renal disorder    Kidney Infection    Stroke [facility])    x12    Social History:  Social History   Socioeconomic History   Marital status: Single    Spouse name: Not on file   Number of children: Not on file   Years of education: Not on file   Highest education level: Not on file  Occupational History   Not on file  Tobacco Use   Smoking status: Every Day    Packs/day: 0.50    Types: Cigarettes   Smokeless tobacco: Never  Vaping Use   Vaping Use: Never used  Substance and Sexual Activity   Alcohol use: Yes    Comment: daily   Drug use: Not Currently    Types: Cocaine   Sexual activity: Not on file  Other Topics Concern   Not on file  Social History Narrative   Not on file   Social Determinants of Health   Financial Resource Strain: Not on file  Food Insecurity: Food Insecurity Present (05/29/2022)   Hunger Vital Sign  Worried About Charity fundraiser in the Last Year: Sometimes true    [employer] of Food in the Last Year: Sometimes true  Transportation Needs: Unmet Transportation Needs (05/29/2022)   PRAPARE - Hydrologist (Medical): Yes    Lack of Transportation (Non-Medical): Yes  Physical Activity: Not on file  Stress: Not on file  Social Connections: Not on file  Intimate Partner Violence: Not At Risk (05/29/2022)   Humiliation, Afraid, Rape, and Kick questionnaire    Fear of Current or Ex-Partner: No    Emotionally Abused: No    Physically Abused: No    Sexually Abused: No     Medications:   Current Outpatient Medications on File Prior to Visit  Medication Sig Dispense Refill   atorvastatin (LIPITOR) 40 MG tablet Take 1 tablet (40 mg total) by mouth daily. 30 tablet 0   buprenorphine-naloxone (SUBOXONE) 8-2 mg SUBL SL tablet Place 1 tablet under the tongue 3 (three) times daily.     cloNIDine (CATAPRES) 0.1 MG tablet Take 1 tablet (0.1 mg total) by mouth 2 (two) times daily. 60 tablet 2   clopidogrel (PLAVIX) 75 MG tablet Take 1 tablet (75 mg total) by mouth daily. 30 tablet 2   escitalopram (LEXAPRO) 10 MG tablet Take 1 tablet (10 mg total) by mouth daily. 30 tablet 0   ondansetron (ZOFRAN) 8 MG tablet Take 1 tablet (8 mg total) by mouth every 8 (eight) hours as needed for nausea or vomiting. 20 tablet 2   pantoprazole (PROTONIX) 40 MG tablet Take 1 tablet (40 mg total) by mouth daily. 90 tablet 0   No current facility-administered medications on file prior to visit.    Allergies:   Allergies  Allergen Reactions   Ibuprofen Anaphylaxis, Swelling and Other (See Comments)    Patient thinks it may have caused her throat to "swell"  (** PATIENT HAS BEEN TOLERATING THIS IN 2023**)   Latex Shortness Of Breath   Peanut-Containing Drug Products Anaphylaxis   Penicillins Anaphylaxis and Hives    Has patient had a PCN reaction causing immediate rash, facial/tongue/throat swelling, SOB or lightheadedness with hypotension: Yes Has patient had a PCN reaction causing severe rash involving mucus membranes or skin necrosis: unknown Has patient had a PCN reaction that required hospitalization No Has patient had a PCN reaction occurring within the last 10 years: No If all of the above answers are "NO", then may proceed with Cephalosporin use. Tolerated ceftriaxone 2018 & 2019    Acetaminophen Other (See Comments)    Causes patient to have an upset stomach- Tolerating this in 2023 (325 mg tablets)   Codeine Hives and Itching   Lisinopril Swelling   Ativan  [Lorazepam] Other (See Comments)    "Hallucinations," per patient    Physical Exam General: Mildly obese middle-aged Caucasian lady, seated, in no evident distress Head: head normocephalic and atraumatic.   Neck: supple with no carotid or supraclavicular bruits Cardiovascular: regular rate and rhythm, no murmurs Musculoskeletal: no deformity Skin:  no rash/petichiae Vascular:  Normal pulses all extremities  Neurologic Exam Mental Status: Awake and fully alert. Oriented to place and time. Recent and remote memory intact. Attention span, concentration and fund of knowledge appropriate. Mood and affect appropriate.  Cranial Nerves: Fundoscopic exam reveals sharp disc margins. Pupils equal, briskly reactive to light. Extraocular movements full without nystagmus. Visual fields full to confrontation. Hearing intact. Facial sensation intact. Face, tongue, palate moves normally and symmetrically.  Motor: Normal bulk and  tone. Normal strength in all tested extremity muscles. Sensory.: intact to touch , pinprick , position and vibratory sensation.  Coordination: Rapid alternating movements normal in all extremities. Finger-to-nose and heel-to-shin performed accurately bilaterally. Gait and Station: Arises from chair without difficulty. Stance is normal. Gait demonstrates normal stride length and balance . Able to heel, toe and tandem walk without difficulty.  Slightly unsteady on narrow-based with eyes Reflexes: 1+ and symmetric. Toes downgoing.   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  Vascular risk factors of hypertension, hyperlipidemia, polysubstance abuse, cocaine, alcoholism and smoking     PLAN:I had a long d/w patient about his recent stroke, risk for recurrent stroke/TIAs, personally  independently reviewed imaging studies and stroke evaluation results and answered questions.Continue Plavix (clopidogrel ) 75 mg daily  for secondary stroke prevention and maintain strict control of hypertension with blood pressure goal below 130/90, diabetes with hemoglobin A1c goal below 6.5% and lipids with LDL cholesterol goal below 70 mg/dL. I also advised the patient to eat a healthy diet with plenty of whole grains, cereals, fruits and vegetables, exercise regularly and maintain ideal body weight.  Refer for outpatient physical and Occupational Therapy.  Check outpatient arterial Doppler bubble study for PFO.  Patient counseled to quit smoking cigarettes, drinking alcohol and using cocaine and voiced understanding.  Followup in the future with my nurse practitioner in 3 months or call earlier if necessary.  Greater than 50% time.  During this 45 minute consultation visit was spent on counseling and coordination of care about her multiple strokes and discussion about evaluation treatment and prevention  Delia Heady, MD Note: This document was prepared with digital dictation and possible smart phrase technology. Any transcriptional errors that result from this process are unintentional.

## 2022-09-23 ENCOUNTER — Ambulatory Visit (HOSPITAL_COMMUNITY)
Admission: RE | Admit: 2022-09-23 | Discharge: 2022-09-23 | Disposition: A | Discharge status: Home / Self Care | Payer: Medicaid Other | Source: Ambulatory Visit | Attending: Neurology | Admitting: Neurology

## 2022-09-23 DIAGNOSIS — I6381 Other cerebral infarction due to occlusion or stenosis of small artery: Secondary | ICD-10-CM | POA: Diagnosis present

## 2022-09-24 NOTE — Progress Notes (Signed)
The results of the negativeTCD  bubble study communicated to the patient at the time of the study

## 2022-10-04 ENCOUNTER — Other Ambulatory Visit: Payer: Self-pay | Admitting: Nurse Practitioner

## 2022-10-04 DIAGNOSIS — F419 Anxiety disorder, unspecified: Secondary | ICD-10-CM

## 2022-10-06 NOTE — Telephone Encounter (Signed)
Caller & Relationship to patient:  MRN #  [id number]   Call Back Number:   Date of Last Office Visit: 09/07/2022     Date of Next Office Visit: 10/08/2022    Medication(s) to be Refilled:  see below   Preferred Pharmacy:   ** Please notify patient to allow 48-72 hours to process** **Let patient know to contact pharmacy at the end of the day to make sure medication is ready. ** **If patient has not been seen in a year or longer, book an appointment **Advise to use MyChart for refill requests OR to contact their pharmacy

## 2022-10-08 ENCOUNTER — Ambulatory Visit (INDEPENDENT_AMBULATORY_CARE_PROVIDER_SITE_OTHER): Payer: Medicaid Other | Admitting: Nurse Practitioner

## 2022-10-08 ENCOUNTER — Encounter: Payer: Self-pay | Admitting: Nurse Practitioner

## 2022-10-08 VITALS — BP 119/84 | HR 64 | Temp 97.2°F | Wt 170.5 lb

## 2022-10-08 DIAGNOSIS — F32A Depression, unspecified: Secondary | ICD-10-CM

## 2022-10-08 DIAGNOSIS — R5383 Other fatigue: Secondary | ICD-10-CM

## 2022-10-08 DIAGNOSIS — F419 Anxiety disorder, unspecified: Secondary | ICD-10-CM | POA: Diagnosis not present

## 2022-10-08 DIAGNOSIS — E569 Vitamin deficiency, unspecified: Secondary | ICD-10-CM | POA: Diagnosis not present

## 2022-10-08 MED ORDER — HYDROXYZINE HCL 10 MG PO TABS: 10.0000 mg | ORAL_TABLET | Freq: Three times a day (TID) | ORAL | 0 refills | Status: DC | PRN

## 2022-10-08 NOTE — Progress Notes (Signed)
$@Patientn$  ID: [name], female    DOB: 18-Dec-1985, 37 y.o.   MRN: [id number]  Chief Complaint  Patient presents with   Anxiety    Referring provider: Fenton Foy, NP   HPI  37 y.o.f w/ history significant of CVA, cirrhosis of the liver, HTN, cocaine abuse, EtOH abuse     Patient presents today for acute visit.  She states that she has been having increased anxiety recently and lack of energy.  Patient is asking for Klonopin for anxiety she is also asking for phentermine for weight loss.  Patient is currently in a Suboxone clinic ([place] stop).  We discussed that we cannot prescribe her controlled substances.  We will refer patient to psychiatry.  Patient is already on Lexapro and we will trial Vistaril for breakthrough anxiety. Denies f/c/s, n/v/d, hemoptysis, PND, leg swelling Denies chest pain or edema      Allergies  Allergen Reactions   Ibuprofen Anaphylaxis, Swelling and Other (See Comments)    Patient thinks it may have caused her throat to "swell"  (** PATIENT HAS BEEN TOLERATING THIS IN 2023**)   Latex Shortness Of Breath   Peanut-Containing Drug Products Anaphylaxis   Penicillins Anaphylaxis and Hives    Has patient had a PCN reaction causing immediate rash, facial/tongue/throat swelling, SOB or lightheadedness with hypotension: Yes Has patient had a PCN reaction causing severe rash involving mucus membranes or skin necrosis: unknown Has patient had a PCN reaction that required hospitalization No Has patient had a PCN reaction occurring within the last 10 years: No If all of the above answers are "NO", then may proceed with Cephalosporin use. Tolerated ceftriaxone 2018 & 2019    Acetaminophen Other (See Comments)    Causes patient to have an upset stomach- Tolerating this in 2023 (325 mg tablets)   Codeine Hives and Itching   Lisinopril Swelling   Ativan [Lorazepam] Other (See Comments)    "Hallucinations," per patient    Immunization History   Administered Date(s) Administered   Influenza,inj,Quad PF,6+ Mos 06/17/2015, 09/23/2016, 08/03/2017, 05/30/2022   Pneumococcal Polysaccharide-23 08/04/2017   Tdap 07/30/2013, 05/08/2016    Past Medical History:  Diagnosis Date   Anxiety    Cirrhosis of liver ([place])    Cocaine use 08/04/2018   History of cocaine use 08/04/2018   Hypertension    Polysubstance abuse ([place])    Renal disorder    Kidney Infection    Stroke ([place])    x12    Tobacco History: Social History   Tobacco Use  Smoking Status Every Day   Packs/day: 0.50   Types: Cigarettes  Smokeless Tobacco Never   Ready to quit: Not Answered Counseling given: Not Answered   Outpatient Encounter Medications as of 10/08/2022  Medication Sig   atorvastatin (LIPITOR) 40 MG tablet TAKE 1 TABLET(40 MG) BY MOUTH DAILY   buprenorphine-naloxone (SUBOXONE) 8-2 mg SUBL SL tablet Place 1 tablet under the tongue 3 (three) times daily.   cloNIDine (CATAPRES) 0.1 MG tablet Take 1 tablet (0.1 mg total) by mouth 2 (two) times daily.   clopidogrel (PLAVIX) 75 MG tablet Take 1 tablet (75 mg total) by mouth daily.   escitalopram (LEXAPRO) 10 MG tablet TAKE 1 TABLET(10 MG) BY MOUTH DAILY   hydrOXYzine (ATARAX) 10 MG tablet Take 1 tablet (10 mg total) by mouth 3 (three) times daily as needed.   ondansetron (ZOFRAN) 8 MG tablet Take 1 tablet (8 mg total) by mouth every 8 (eight) hours as needed for nausea  or vomiting.   pantoprazole (PROTONIX) 40 MG tablet Take 1 tablet (40 mg total) by mouth daily.   [DISCONTINUED] atorvastatin (LIPITOR) 40 MG tablet Take 1 tablet (40 mg total) by mouth daily.   [DISCONTINUED] escitalopram (LEXAPRO) 10 MG tablet Take 1 tablet (10 mg total) by mouth daily.   No facility-administered encounter medications on file as of 10/08/2022.     Review of Systems  Review of Systems  Constitutional: Negative.   HENT: Negative.    Cardiovascular: Negative.   Gastrointestinal: Negative.   Allergic/Immunologic:  Negative.   Neurological: Negative.   Psychiatric/Behavioral:  The patient is nervous/anxious.        Physical Exam  BP 119/84   Pulse 64   Temp (!) 97.2 F (36.2 C)   Wt 170 lb 8 oz (77.3 kg)   LMP 10/08/2022   SpO2 97%   BMI 34.44 kg/m   Wt Readings from Last 5 Encounters:  10/08/22 170 lb 8 oz (77.3 kg)  09/14/22 161 lb 12.8 oz (73.4 kg)  09/07/22 166 lb 6.4 oz (75.5 kg)  05/30/22 164 lb 0.4 oz (74.4 kg)  05/28/22 146 lb 4 oz (66.3 kg)     Physical Exam Vitals and nursing note reviewed.  Constitutional:      General: She is not in acute distress.    Appearance: She is well-developed.  Cardiovascular:     Rate and Rhythm: Normal rate and regular rhythm.  Pulmonary:     Effort: Pulmonary effort is normal.     Breath sounds: Normal breath sounds.  Neurological:     Mental Status: She is alert and oriented to person, place, and time.      Lab Results:  CBC    Component Value Date/Time   WBC 5.8 10/08/2022 1440   WBC 10.1 05/29/2022 0035   RBC 4.25 10/08/2022 1440   RBC 4.27 05/29/2022 0035   HGB 12.3 10/08/2022 1440   HCT 37.6 10/08/2022 1440   PLT 234 10/08/2022 1440   MCV 89 10/08/2022 1440   MCH 28.9 10/08/2022 1440   MCH 30.0 05/29/2022 0035   MCHC 32.7 10/08/2022 1440   MCHC 32.6 05/29/2022 0035   RDW 15.0 10/08/2022 1440   LYMPHSABS 1.6 05/29/2022 0035   MONOABS 0.8 05/29/2022 0035   EOSABS 0.1 05/29/2022 0035   BASOSABS 0.0 05/29/2022 0035    BMET    Component Value Date/Time   NA 139 10/08/2022 1440   K 3.7 10/08/2022 1440   CL 102 10/08/2022 1440   CO2 22 10/08/2022 1440   GLUCOSE 156 (H) 10/08/2022 1440   GLUCOSE 103 (H) 05/30/2022 0841   BUN 15 10/08/2022 1440   CREATININE 0.77 10/08/2022 1440   CREATININE 0.69 09/23/2016 1442   CALCIUM 8.7 10/08/2022 1440   GFRNONAA >60 05/30/2022 0841   GFRNONAA >89 09/23/2016 1442   GFRAA >60 05/09/2020 0524   GFRAA >89 09/23/2016 1442    BNP No results found for: "BNP"  ProBNP No  results found for: "PROBNP"  Imaging: VAS [place] TRANSCRANIAL DOPPLER W BUBBLES  Result Date: 09/23/2022  Transcranial Doppler with Bubble Patient Name:  KEITH HANLINE  Date of Exam:   09/23/2022 Medical Rec #: [id number]           Accession #:    [id number] Date of Birth: 18-Jun-1986            Patient Gender: F Patient Age:   54 years Exam Location:  [facility] Procedure:  VAS [place] TRANSCRANIAL DOPPLER W BUBBLES Referring Phys: PRAMOD SETHI --------------------------------------------------------------------------------  Indications: Stroke. Performing Technologist: Darlin Coco RDMS, RVT  Examination Guidelines: A complete evaluation includes B-mode imaging, spectral Doppler, color Doppler, and power Doppler as needed of all accessible portions of each vessel. Bilateral testing is considered an integral part of a complete examination. Limited examinations for reoccurring indications may be performed as noted.  Summary: No HITS at rest or during Valsalva. Negative transcranial Doppler Bubble study with no evidence of right to left intracardiac communication.  A vascular evaluation was performed. The right middle cerebral artery was studied. An IV was inserted into the patient's right antecubital. Verbal informed consent was obtained.  Negative TCD Bubble study *See table(s) above for TCD measurements and observations.  Diagnosing physician: Antony Contras MD Electronically signed by Antony Contras MD on 09/23/2022 at 2:21:27 PM.    Final      Assessment & Plan:   Anxiety and depression - Ambulatory referral to Psychiatry - hydrOXYzine (ATARAX) 10 MG tablet; Take 1 tablet (10 mg total) by mouth 3 (three) times daily as needed.  Dispense: 30 tablet; Refill: 0 - CBC - Comprehensive metabolic panel - Thyroid Panel With TSH - Vitamin B12  2. Other fatigue  - CBC - Comprehensive metabolic panel - Thyroid Panel With TSH - Vitamin B12  3. Vitamin deficiency  - Vitamin B12 - Vitamin D,  25-hydroxy  Follow up:  Follow up in 3 months     Fenton Foy, NP 10/09/2022

## 2022-10-08 NOTE — Patient Instructions (Addendum)
1. Anxiety and depression  - Ambulatory referral to Psychiatry - hydrOXYzine (ATARAX) 10 MG tablet; Take 1 tablet (10 mg total) by mouth 3 (three) times daily as needed.  Dispense: 30 tablet; Refill: 0 - CBC - Comprehensive metabolic panel - Thyroid Panel With TSH - Vitamin B12  2. Other fatigue  - CBC - Comprehensive metabolic panel - Thyroid Panel With TSH - Vitamin B12  3. Vitamin deficiency  - Vitamin B12 - Vitamin D, 25-hydroxy  Follow up:  Follow up in 3 months

## 2022-10-09 DIAGNOSIS — F411 Generalized anxiety disorder: Secondary | ICD-10-CM | POA: Insufficient documentation

## 2022-10-09 DIAGNOSIS — F419 Anxiety disorder, unspecified: Secondary | ICD-10-CM | POA: Insufficient documentation

## 2022-10-09 LAB — COMPREHENSIVE METABOLIC PANEL
ALT: 26 IU/L (ref 0–32)
AST: 30 IU/L (ref 0–40)
Albumin/Globulin Ratio: 1.8 (ref 1.2–2.2)
Albumin: 4.2 g/dL (ref 3.9–4.9)
Alkaline Phosphatase: 98 IU/L (ref 44–121)
BUN/Creatinine Ratio: 19 (ref 9–23)
BUN: 15 mg/dL (ref 6–20)
Bilirubin Total: 0.3 mg/dL (ref 0.0–1.2)
CO2: 22 mmol/L (ref 20–29)
Calcium: 8.7 mg/dL (ref 8.7–10.2)
Chloride: 102 mmol/L (ref 96–106)
Creatinine, Ser: 0.77 mg/dL (ref 0.57–1.00)
Globulin, Total: 2.4 g/dL (ref 1.5–4.5)
Glucose: 156 mg/dL — ABNORMAL HIGH (ref 70–99)
Potassium: 3.7 mmol/L (ref 3.5–5.2)
Sodium: 139 mmol/L (ref 134–144)
Total Protein: 6.6 g/dL (ref 6.0–8.5)
eGFR: 101 mL/min/{1.73_m2} (ref >59)

## 2022-10-09 LAB — CBC
Hematocrit: 37.6 % (ref 34.0–46.6)
Hemoglobin: 12.3 g/dL (ref 11.1–15.9)
MCH: 28.9 pg (ref 26.6–33.0)
MCHC: 32.7 g/dL (ref 31.5–35.7)
MCV: 89 fL (ref 79–97)
Platelets: 234 10*3/uL (ref 150–450)
RBC: 4.25 x10E6/uL (ref 3.77–5.28)
RDW: 15 % (ref 11.7–15.4)
WBC: 5.8 10*3/uL (ref 3.4–10.8)

## 2022-10-09 LAB — VITAMIN B12: Vitamin B-12: 414 pg/mL (ref 232–1245)

## 2022-10-09 LAB — THYROID PANEL WITH TSH
Free Thyroxine Index: 1.7 (ref 1.2–4.9)
T3 Uptake Ratio: 27 % (ref 24–39)
T4, Total: 6.3 ug/dL (ref 4.5–12.0)
TSH: 4.1 u[IU]/mL (ref 0.450–4.500)

## 2022-10-09 LAB — VITAMIN D 25 HYDROXY (VIT D DEFICIENCY, FRACTURES): Vit D, 25-Hydroxy: 27.1 ng/mL — ABNORMAL LOW (ref 30.0–100.0)

## 2022-10-09 NOTE — Assessment & Plan Note (Signed)
>>  ASSESSMENT AND PLAN FOR ANXIETY AND DEPRESSION WRITTEN ON 10/09/2022  8:27 AM BY NICHOLS, Gildardo Pounds, NP  - Ambulatory referral to Psychiatry - hydrOXYzine (ATARAX) 10 MG tablet; Take 1 tablet (10 mg total) by mouth 3 (three) times daily as needed.  Dispense: 30 tablet; Refill: 0 - CBC - Comprehensive metabolic panel - Thyroid Panel With TSH - Vitamin B12  2. Other fatigue  - CBC - Comprehensive metabolic panel - Thyroid Panel With TSH - Vitamin B12  3. Vitamin deficiency  - Vitamin B12 - Vitamin D, 25-hydroxy  Follow up:  Follow up in 3 months

## 2022-10-09 NOTE — Assessment & Plan Note (Signed)
-   Ambulatory referral to Psychiatry - hydrOXYzine (ATARAX) 10 MG tablet; Take 1 tablet (10 mg total) by mouth 3 (three) times daily as needed.  Dispense: 30 tablet; Refill: 0 - CBC - Comprehensive metabolic panel - Thyroid Panel With TSH - Vitamin B12  2. Other fatigue  - CBC - Comprehensive metabolic panel - Thyroid Panel With TSH - Vitamin B12  3. Vitamin deficiency  - Vitamin B12 - Vitamin D, 25-hydroxy  Follow up:  Follow up in 3 months

## 2022-11-02 ENCOUNTER — Other Ambulatory Visit: Payer: Self-pay | Admitting: Nurse Practitioner

## 2022-11-02 DIAGNOSIS — F419 Anxiety disorder, unspecified: Secondary | ICD-10-CM

## 2022-11-03 ENCOUNTER — Telehealth: Payer: Self-pay

## 2022-11-04 NOTE — Telephone Encounter (Signed)
Her last labs were in February

## 2022-11-13 ENCOUNTER — Telehealth: Payer: Self-pay

## 2022-11-13 ENCOUNTER — Ambulatory Visit (INDEPENDENT_AMBULATORY_CARE_PROVIDER_SITE_OTHER): Payer: Medicaid Other | Admitting: Nurse Practitioner

## 2022-11-13 VITALS — BP 142/88 | HR 84 | Temp 97.4°F | Ht 60.0 in | Wt 174.6 lb

## 2022-11-13 DIAGNOSIS — I10 Essential (primary) hypertension: Secondary | ICD-10-CM | POA: Diagnosis not present

## 2022-11-13 DIAGNOSIS — F1020 Alcohol dependence, uncomplicated: Secondary | ICD-10-CM | POA: Diagnosis not present

## 2022-11-13 DIAGNOSIS — F419 Anxiety disorder, unspecified: Secondary | ICD-10-CM | POA: Diagnosis not present

## 2022-11-13 DIAGNOSIS — E559 Vitamin D deficiency, unspecified: Secondary | ICD-10-CM | POA: Diagnosis not present

## 2022-11-13 MED ORDER — VITAMIN D (ERGOCALCIFEROL) 1.25 MG (50000 UNIT) PO CAPS: 50000.0000 [IU] | ORAL_CAPSULE | ORAL | 3 refills | Status: DC

## 2022-11-13 MED ORDER — CETIRIZINE HCL 10 MG PO TABS: 10.0000 mg | ORAL_TABLET | Freq: Every day | ORAL | 11 refills | Status: DC

## 2022-11-13 NOTE — Assessment & Plan Note (Signed)
-   Vitamin D, Ergocalciferol, (DRISDOL) 1.25 MG (50000 UNIT) CAPS capsule; Take 1 capsule (50,000 Units total) by mouth every 7 (seven) days.  Dispense: 5 capsule; Refill: 3  2. Anxiety   3. Uncomplicated alcohol dependence ([place])   Follow up:  Follow up in 3 months or sooner if needed

## 2022-11-13 NOTE — Progress Notes (Signed)
   Care Guide Note  11/13/2022 Name: [name] MRN: [id number] DOB: 28-Jul-1986  Referred by: Fenton Foy, NP Reason for referral : Care Coordination (Outreach to schedule with pharm d )   [name] is a 37 y.o. year old female who is a primary care patient of Fenton Foy, NP. [name] was referred to the pharmacist for assistance related to HTN.    Successful contact was made with the patient to discuss pharmacy services including being ready for the pharmacist to call at least 5 minutes before the scheduled appointment time, to have medication bottles and any blood sugar or blood pressure readings ready for review. The patient agreed to meet with the pharmacist via with the pharmacist via telephone visit on (date/time).  12/08/2022  Noreene Larsson, [place], [place] [address] Direct Dial: [phone] [email or web address]

## 2022-11-13 NOTE — Progress Notes (Signed)
@Patient  ID: [name], female    DOB: Apr 28, 1986, 37 y.o.   MRN: [id number]  Chief Complaint  Patient presents with   Follow-up    Referring provider: Fenton Foy, NP     HPI  37 y.o.f w/ history significant of CVA, cirrhosis of the liver, HTN, cocaine abuse, EtOH abuse    Patient presents today for follow-up visit.  At her last visit she was referred to psychiatry for ongoing anxiety.  She was contacted twice by the psychiatry office but failed to call back to make an appointment.  We will give her the number to call back again today.  We will also get her set up with counseling with Manuela Schwartz in our office.  Patient does continue to drink alcohol but states that she is cutting back.  She did drink heavily last night.  We did discuss options for rehab.  Patient declines at this time.  Will have Manuela Schwartz revisit this when she does counseling with the patient.  At her last appointment here her vitamin D was low.  She has not been taking vitamin D supplements.  Had a prescription today. Denies f/c/s, n/v/d, hemoptysis, PND, leg swelling Denies chest pain or edema      Allergies  Allergen Reactions   Ibuprofen Anaphylaxis, Swelling and Other (See Comments)    Patient thinks it may have caused her throat to "swell"  (** PATIENT HAS BEEN TOLERATING THIS IN 2023**)   Latex Shortness Of Breath   Peanut-Containing Drug Products Anaphylaxis   Penicillins Anaphylaxis and Hives    Has patient had a PCN reaction causing immediate rash, facial/tongue/throat swelling, SOB or lightheadedness with hypotension: Yes Has patient had a PCN reaction causing severe rash involving mucus membranes or skin necrosis: unknown Has patient had a PCN reaction that required hospitalization No Has patient had a PCN reaction occurring within the last 10 years: No If all of the above answers are "NO", then may proceed with Cephalosporin use. Tolerated ceftriaxone 2018 & 2019    Acetaminophen Other  (See Comments)    Causes patient to have an upset stomach- Tolerating this in 2023 (325 mg tablets)   Codeine Hives and Itching   Lisinopril Swelling   Ativan [Lorazepam] Other (See Comments)    "Hallucinations," per patient    Immunization History  Administered Date(s) Administered   Influenza,inj,Quad PF,6+ Mos 06/17/2015, 09/23/2016, 08/03/2017, 05/30/2022   Pneumococcal Polysaccharide-23 08/04/2017   Tdap 07/30/2013, 05/08/2016    Past Medical History:  Diagnosis Date   Anxiety    Cirrhosis of liver ([place])    Cocaine use 08/04/2018   History of cocaine use 08/04/2018   Hypertension    Polysubstance abuse ([place])    Renal disorder    Kidney Infection    Stroke ([place])    x12    Tobacco History: Social History   Tobacco Use  Smoking Status Every Day   Packs/day: .5   Types: Cigarettes  Smokeless Tobacco Never   Ready to quit: Not Answered Counseling given: Not Answered   Outpatient Encounter Medications as of 11/13/2022  Medication Sig   atorvastatin (LIPITOR) 40 MG tablet TAKE 1 TABLET(40 MG) BY MOUTH DAILY   buprenorphine-naloxone (SUBOXONE) 8-2 mg SUBL SL tablet Place 1 tablet under the tongue 3 (three) times daily.   cetirizine (ZYRTEC) 10 MG tablet Take 1 tablet (10 mg total) by mouth daily.   cloNIDine (CATAPRES) 0.1 MG tablet Take 1 tablet (0.1 mg total) by mouth 2 (two) times daily.  clopidogrel (PLAVIX) 75 MG tablet Take 1 tablet (75 mg total) by mouth daily.   escitalopram (LEXAPRO) 10 MG tablet TAKE 1 TABLET(10 MG) BY MOUTH DAILY   hydrOXYzine (ATARAX) 10 MG tablet TAKE 1 TABLET(10 MG) BY MOUTH THREE TIMES DAILY AS NEEDED   ondansetron (ZOFRAN) 8 MG tablet Take 1 tablet (8 mg total) by mouth every 8 (eight) hours as needed for nausea or vomiting.   pantoprazole (PROTONIX) 40 MG tablet Take 1 tablet (40 mg total) by mouth daily.   Vitamin D, Ergocalciferol, (DRISDOL) 1.25 MG (50000 UNIT) CAPS capsule Take 1 capsule (50,000 Units total) by mouth every 7  (seven) days.   No facility-administered encounter medications on file as of 11/13/2022.     Review of Systems  Review of Systems  Constitutional: Negative.   HENT: Negative.    Cardiovascular: Negative.   Gastrointestinal: Negative.   Allergic/Immunologic: Negative.   Neurological: Negative.   Psychiatric/Behavioral: Negative.         Physical Exam  BP (!) 142/88   Pulse 84   Temp (!) 97.4 F (36.3 C)   Ht 5' (1.524 m)   Wt 174 lb 9.6 oz (79.2 kg)   LMP 11/13/2022   SpO2 97%   BMI 34.10 kg/m   Wt Readings from Last 5 Encounters:  11/13/22 174 lb 9.6 oz (79.2 kg)  10/08/22 170 lb 8 oz (77.3 kg)  09/14/22 161 lb 12.8 oz (73.4 kg)  09/07/22 166 lb 6.4 oz (75.5 kg)  05/30/22 164 lb 0.4 oz (74.4 kg)     Physical Exam Vitals and nursing note reviewed.  Constitutional:      General: She is not in acute distress.    Appearance: She is well-developed.  Cardiovascular:     Rate and Rhythm: Normal rate and regular rhythm.  Pulmonary:     Effort: Pulmonary effort is normal.     Breath sounds: Normal breath sounds.  Neurological:     Mental Status: She is alert and oriented to person, place, and time.      Lab Results:  CBC    Component Value Date/Time   WBC 5.8 10/08/2022 1440   WBC 10.1 05/29/2022 0035   RBC 4.25 10/08/2022 1440   RBC 4.27 05/29/2022 0035   HGB 12.3 10/08/2022 1440   HCT 37.6 10/08/2022 1440   PLT 234 10/08/2022 1440   MCV 89 10/08/2022 1440   MCH 28.9 10/08/2022 1440   MCH 30.0 05/29/2022 0035   MCHC 32.7 10/08/2022 1440   MCHC 32.6 05/29/2022 0035   RDW 15.0 10/08/2022 1440   LYMPHSABS 1.6 05/29/2022 0035   MONOABS 0.8 05/29/2022 0035   EOSABS 0.1 05/29/2022 0035   BASOSABS 0.0 05/29/2022 0035    BMET    Component Value Date/Time   NA 139 10/08/2022 1440   K 3.7 10/08/2022 1440   CL 102 10/08/2022 1440   CO2 22 10/08/2022 1440   GLUCOSE 156 (H) 10/08/2022 1440   GLUCOSE 103 (H) 05/30/2022 0841   BUN 15 10/08/2022 1440    CREATININE 0.77 10/08/2022 1440   CREATININE 0.69 09/23/2016 1442   CALCIUM 8.7 10/08/2022 1440   GFRNONAA >60 05/30/2022 0841   GFRNONAA >89 09/23/2016 1442   GFRAA >60 05/09/2020 0524   GFRAA >89 09/23/2016 1442    BNP No results found for: "BNP"  ProBNP No results found for: "PROBNP"  Imaging: No results found.   Assessment & Plan:   Vitamin D deficiency - Vitamin D, Ergocalciferol, (DRISDOL) 1.25 MG (  50000 UNIT) CAPS capsule; Take 1 capsule (50,000 Units total) by mouth every 7 (seven) days.  Dispense: 5 capsule; Refill: 3  2. Anxiety   3. Uncomplicated alcohol dependence ([place])   Follow up:  Follow up in 3 months or sooner if needed     Fenton Foy, NP 11/13/2022

## 2022-11-13 NOTE — Patient Instructions (Addendum)
1. Vitamin D deficiency  - Vitamin D, Ergocalciferol, (DRISDOL) 1.25 MG (50000 UNIT) CAPS capsule; Take 1 capsule (50,000 Units total) by mouth every 7 (seven) days.  Dispense: 5 capsule; Refill: 3  2. Anxiety   3. Uncomplicated alcohol dependence ([place])   Follow up:  Follow up in 3 months or sooner if needed    Finding Treatment for Addiction Addiction is a complex disease of the brain that causes an uncontrollable (compulsive) need for: A substance. This includes alcohol, drugs, or prescription medicines, such as painkillers. An activity or behavior, such as gambling or shopping. What are the risks? Addiction is a progressive disease. Without treatment, addiction can get worse. Living with addiction puts you at higher risk for injury, poor health, loss of employment, loss of money, and even death. Addiction changes the way your brain works. Because of this change: The need for the medicine, drug, or activity can become so strong that you think about it all the time. Getting more and more of your addiction becomes the most important thing to you. You may find yourself leaving other activities and relationships to pursue your addiction. You can become physically dependent on a substance. Your health, behavior, emotions, and relationships can change for the worse. How to select a treatment program Know your options There may be options for treatment programs and plans based on your addiction, condition, needs, and preferences. No single treatment is right for everyone. Treatment programs can be: Outpatient. You live at home and go to work or school, but you go to a clinic for treatment. Inpatient. You live and sleep at the program facility during treatment. Programs may include: Medicine. You may need medicine to treat the addiction itself or to treat anxiety or depression. Counseling and behavior therapy. This can help individuals and families behave in healthier ways and relate  more effectively. Support groups. Confidential group therapy, such as a 12-step program, can help individuals and families during treatment and recovery. A combination of education, counseling, and a 12-step, spirituality-based approach.  Think about your needs Think about your individual requirements when selecting a treatment program. Ask about: The overall approach to treatment. Some programs are strictly 12-step programs. Some have a more flexible approach. Programs may differ in length of stay, setting, and size. Some programs include your family in your treatment plan. Support may be offered to them throughout the treatment process, as well as instructions for them when you are discharged. You may continue to receive support after you have left the program. The types of medical services that are offered. Find out if the program: Offers specific treatment for your particular addiction. Meets all of your needs, including physical and cultural needs. Includes any medicines you might need. Offers mental health counseling as part of your treatment. Offers the 12-step meetings at the center, or if transport is available for you to attend meetings at other locations. The cost and types of insurance that are accepted. Some programs are sponsored by the government. They support people in treatment who do not have private insurance. If you do not have insurance, or if you choose to attend a program that does not accept your insurance, call the treatment center. Tell them your financial needs and ask whether a payment plan can be set up. There are also organizations that will help you find the resources for treatment. You can find them online by searching for "treatment for addiction." If the program is certified by the appropriate government agency. Follow these  instructions at home: Find supportive people who will help you stay away from your addiction and stay sober. Do not use the substance or  engage in the activity. If you have been through treatment: Follow your plan. The plan is usually developed by you and your health care provider during treatment. These discussions are confidential. Go to meetings with other people in recovery. Avoid people, situations, and things that lead you to do the things you are addicted to (triggers). Where to find more information Recovered: recovered.org Substance Abuse and Mental Health Services Administration [facility]): findtreatment.[employer].com.cy [employer] on Problem Gambling: www.ncpgambling.org Get help right away if: You have serious thoughts about hurting yourself or others. Get help right away if you feel like you may hurt yourself or others, or have thoughts about taking your own life. Go to your nearest emergency room or: Call 911. Call the [place] at 419-320-2175 or 988 in the [place] This is open 24 hours a day. Text the Crisis Text Line at 678 776 6423. Summary Addiction changes the way your brain works. These changes cause a desire to repeat and increase the use of the substance or behavior. Addiction is a progressive disease. Without treatment, addiction can get worse. Living with addiction puts you at higher risk for injury, poor health, loss of employment, loss of money, and even death. There may be options for treatment programs and plans based on your addiction, condition, needs, and preferences. No single treatment is right for everyone. Your health care provider can help you find the right treatment. These discussions are confidential. This information is not intended to replace advice given to you by your health care provider. Make sure you discuss any questions you have with your health care provider. Document Revised: 03/06/2021 Document Reviewed: 02/12/2021 Elsevier Patient Education  [employer].

## 2022-11-24 ENCOUNTER — Telehealth: Payer: Self-pay | Admitting: Neurology

## 2022-11-24 NOTE — Telephone Encounter (Signed)
Unable to reach pt over the phone, sent mychart msg informing pt of need to reschedule 12/28/22 appointment - NP out

## 2022-11-26 ENCOUNTER — Other Ambulatory Visit: Payer: Self-pay

## 2022-11-26 DIAGNOSIS — F419 Anxiety disorder, unspecified: Secondary | ICD-10-CM

## 2022-11-26 MED ORDER — ATORVASTATIN CALCIUM 40 MG PO TABS: ORAL_TABLET | ORAL | 1 refills | Status: DC

## 2022-11-26 MED ORDER — ESCITALOPRAM OXALATE 10 MG PO TABS: ORAL_TABLET | ORAL | 0 refills | Status: DC

## 2022-11-26 NOTE — Telephone Encounter (Signed)
Please advise KH 

## 2022-11-30 ENCOUNTER — Institutional Professional Consult (permissible substitution): Payer: Medicaid Other | Admitting: Clinical

## 2022-12-04 ENCOUNTER — Telehealth (HOSPITAL_BASED_OUTPATIENT_CLINIC_OR_DEPARTMENT_OTHER): Payer: Self-pay | Admitting: *Deleted

## 2022-12-04 ENCOUNTER — Encounter (HOSPITAL_BASED_OUTPATIENT_CLINIC_OR_DEPARTMENT_OTHER): Payer: Medicaid Other | Admitting: Medical

## 2022-12-04 NOTE — Telephone Encounter (Signed)
Called patient and was unable to leave a message- no voicemail set up.

## 2022-12-08 ENCOUNTER — Telehealth: Payer: Self-pay | Admitting: Pharmacist

## 2022-12-08 ENCOUNTER — Other Ambulatory Visit: Payer: Medicaid Other | Admitting: Pharmacist

## 2022-12-08 NOTE — Progress Notes (Unsigned)
Attempted to contact patient for scheduled appointment for medication management. Left HIPAA compliant message for patient to return my call at their convenience.    Catie T. Tyric Rodeheaver, PharmD, BCACP, CPP [facility] [phone]  

## 2022-12-10 ENCOUNTER — Encounter: Payer: Self-pay | Admitting: Neurology

## 2022-12-10 NOTE — Telephone Encounter (Signed)
Pt called and is needing a release letter for Plavix (Blood thinner) for 7 days prior to surgery  Scheduled May 1,  Otoscopic on her right knee (surgery)

## 2022-12-11 ENCOUNTER — Telehealth: Payer: Self-pay | Admitting: Nurse Practitioner

## 2022-12-11 NOTE — Telephone Encounter (Signed)
Preop form dated 12/10/22 from [facility] orthopedic completed and given to Ruben Im, RMA. Please fax.

## 2022-12-11 NOTE — Telephone Encounter (Signed)
[facility]

## 2022-12-15 ENCOUNTER — Telehealth: Payer: Self-pay

## 2022-12-15 ENCOUNTER — Institutional Professional Consult (permissible substitution): Payer: Medicaid Other | Admitting: Clinical

## 2022-12-15 NOTE — Telephone Encounter (Signed)
Unsure how this fell into my que!  Have a good day :)

## 2022-12-15 NOTE — Progress Notes (Signed)
   Care Guide Note  12/15/2022 Name: WILLINE SCHWALBE MRN: [id number] DOB: 11-Oct-1985  Referred by: Ivonne Andrew, NP Reason for referral : Care Coordination (Outreach to reschedule missed initial with Pharm d )   BEAULAH ROMANEK is a 37 y.o. year old female who is a primary care patient of Ivonne Andrew, NP. Yanessa R Fabio was referred to the pharmacist for assistance related to DM.    An unsuccessful telephone outreach was attempted today to contact the patient who was referred to the pharmacy team for assistance with medication management. Additional attempts will be made to contact the patient.   Penne Lash, RMA Care Guide [facility]  [place], [place] [address] Direct Dial: [phone] [email or web address]

## 2022-12-16 NOTE — Progress Notes (Signed)
   Care Guide Note  12/16/2022 Name: SARAHLYNN CISNERO MRN: [id number] DOB: 04/13/1986  Referred by: Ivonne Andrew, NP Reason for referral : Care Coordination (Outreach to reschedule missed initial with Pharm d )   PAULETT KAUFHOLD is a 37 y.o. year old female who is a primary care patient of Ivonne Andrew, NP. Lynita R Sculley was referred to the pharmacist for assistance related to HTN.    An unsuccessful telephone outreach was attempted today to contact the patient who was referred to the pharmacy team for assistance with medication management. Additional attempts will be made to contact the patient.   Penne Lash, RMA Care Guide [facility]  [place], [place] [address] Direct Dial: [phone] [email or web address]

## 2022-12-16 NOTE — Telephone Encounter (Signed)
Done Kh 

## 2022-12-22 ENCOUNTER — Telehealth: Payer: Self-pay | Admitting: Neurology

## 2022-12-22 NOTE — Progress Notes (Signed)
   Care Guide Note  12/22/2022 Name: MERILYNN HAYDU MRN: [id number] DOB: 27-Mar-1986  Referred by: Ivonne Andrew, NP Reason for referral : Care Coordination (Outreach to reschedule missed initial with Pharm d )   KRYSTEENA STALKER is a 37 y.o. year old female who is a primary care patient of Ivonne Andrew, NP. Jaquaya R Montel was referred to the pharmacist for assistance related to HTN.    A third unsuccessful telephone outreach was attempted today to contact the patient who was referred to the pharmacy team for assistance with medication management. The Population Health team is pleased to engage with this patient at any time in the future upon receipt of referral and should he/she be interested in assistance from the [facility] team.   Penne Lash, RMA Care Guide [facility]  [place], [place] [address] Direct Dial: [phone] [email or web address]

## 2022-12-22 NOTE — Telephone Encounter (Signed)
Received a letter from [facility] orthropedics stating that the plan to offer right knee arthroscopy. They wanted to get neurology input on whether the patient can hold plavix prior to the outpatient procedure. Last MRI 04/2022 showed numerous small areas of acute infarcts. Will discuss risks with Dr Pearlean Brownie and see if pt ok to move forward.

## 2022-12-22 NOTE — Telephone Encounter (Signed)
Per Dr Pearlean Brownie, pt is cleared to have the procedure. She should hold plavix 5 days prior and resume post op day 1. I will print this phone note and attach with the letter sending to [facility] ortho

## 2022-12-28 ENCOUNTER — Ambulatory Visit: Payer: Medicaid Other | Admitting: Neurology

## 2022-12-29 ENCOUNTER — Other Ambulatory Visit: Payer: Self-pay | Admitting: Nurse Practitioner

## 2022-12-29 DIAGNOSIS — F419 Anxiety disorder, unspecified: Secondary | ICD-10-CM

## 2023-01-25 ENCOUNTER — Emergency Department (HOSPITAL_COMMUNITY): Payer: Medicaid Other

## 2023-01-25 ENCOUNTER — Emergency Department (HOSPITAL_COMMUNITY)
Admission: EM | Admit: 2023-01-25 | Discharge: 2023-01-25 | Disposition: A | Discharge status: Home / Self Care | Payer: Medicaid Other | Attending: Emergency Medicine | Admitting: Emergency Medicine

## 2023-01-25 ENCOUNTER — Other Ambulatory Visit: Payer: Self-pay

## 2023-01-25 DIAGNOSIS — Z8673 Personal history of transient ischemic attack (TIA), and cerebral infarction without residual deficits: Secondary | ICD-10-CM | POA: Insufficient documentation

## 2023-01-25 DIAGNOSIS — Z79899 Other long term (current) drug therapy: Secondary | ICD-10-CM | POA: Diagnosis not present

## 2023-01-25 DIAGNOSIS — Z91040 Latex allergy status: Secondary | ICD-10-CM | POA: Diagnosis not present

## 2023-01-25 DIAGNOSIS — Z91010 Allergy to peanuts: Secondary | ICD-10-CM | POA: Diagnosis not present

## 2023-01-25 DIAGNOSIS — I10 Essential (primary) hypertension: Secondary | ICD-10-CM | POA: Insufficient documentation

## 2023-01-25 DIAGNOSIS — S8991XA Unspecified injury of right lower leg, initial encounter: Secondary | ICD-10-CM | POA: Diagnosis present

## 2023-01-25 DIAGNOSIS — W010XXA Fall on same level from slipping, tripping and stumbling without subsequent striking against object, initial encounter: Secondary | ICD-10-CM | POA: Insufficient documentation

## 2023-01-25 DIAGNOSIS — S8001XA Contusion of right knee, initial encounter: Secondary | ICD-10-CM | POA: Diagnosis not present

## 2023-01-25 MED ORDER — CLONIDINE HCL 0.1 MG PO TABS
0.1000 mg | ORAL_TABLET | Freq: Once | ORAL | Status: AC
  Administered 2023-01-25: 0.1 mg via ORAL
  Filled 2023-01-25: qty 1

## 2023-01-25 MED ORDER — HYDROCODONE-ACETAMINOPHEN 5-325 MG PO TABS
1.0000 | ORAL_TABLET | Freq: Once | ORAL | Status: AC
  Administered 2023-01-25: 1 via ORAL
  Filled 2023-01-25: qty 1

## 2023-01-25 NOTE — ED Provider Notes (Cosign Needed Addendum)
[place] EMERGENCY DEPARTMENT AT [facility] Provider Note   CSN: [id number] Arrival date & time: 01/25/23  1449     History  Chief Complaint  Patient presents with   Knee Injury    [name] is a 37 y.o. female with medical history of cocaine use, anxiety, cirrhosis of liver, hypertension, polysubstance use, stroke.  Patient presents to ED for evaluation of right knee injury.  Patient reports that 2 weeks ago she had a meniscus repair with Dr. Luiz Blare.  Patient states that she follow-up with Dr. Luiz Blare 5 days ago and had unremarkable follow-up examination.  The patient states that today she [place] on [place] trolley [place] alcohol.  Patient states that [place]" in the [place] and she tripped on the curb landing directly onto her right knee. Patient states that she has extreme pain to the right knee and swelling.  She is also complaining of right-sided ankle pain.  She denies hitting her head, she states that she is taking blood thinners but she has not taken them today.  Denies medications prior to arrival. She again denies striking her head, loss of consciousness, neck pain, back pain.  HPI     Home Medications Prior to Admission medications   Medication Sig Start Date End Date Taking? Authorizing Provider  atorvastatin (LIPITOR) 40 MG tablet TAKE 1 TABLET(40 MG) BY MOUTH DAILY 11/26/22   Ivonne Andrew, NP  buprenorphine-naloxone (SUBOXONE) 8-2 mg SUBL SL tablet Place 1 tablet under the tongue 3 (three) times daily. 09/09/22   [provider]  cetirizine (ZYRTEC) 10 MG tablet Take 1 tablet (10 mg total) by mouth daily. 11/13/22   Ivonne Andrew, NP  cloNIDine (CATAPRES) 0.1 MG tablet Take 1 tablet (0.1 mg total) by mouth 2 (two) times daily. 09/07/22   Ivonne Andrew, NP  escitalopram (LEXAPRO) 10 MG tablet TAKE 1 TABLET(10 MG) BY MOUTH DAILY 11/26/22   Ivonne Andrew, NP  hydrOXYzine (ATARAX) 10 MG tablet TAKE 1  TABLET(10 MG) BY MOUTH THREE TIMES DAILY AS NEEDED 12/30/22   Ivonne Andrew, NP  ondansetron (ZOFRAN) 8 MG tablet Take 1 tablet (8 mg total) by mouth every 8 (eight) hours as needed for nausea or vomiting. 09/07/22   Ivonne Andrew, NP  pantoprazole (PROTONIX) 40 MG tablet TAKE 1 TABLET(40 MG) BY MOUTH DAILY 12/30/22   Ivonne Andrew, NP  Vitamin D, Ergocalciferol, (DRISDOL) 1.25 MG (50000 UNIT) CAPS capsule Take 1 capsule (50,000 Units total) by mouth every 7 (seven) days. 11/13/22   Ivonne Andrew, NP      Allergies    Ibuprofen, Latex, Peanut-containing drug products, Penicillins, Acetaminophen, Codeine, Lisinopril, and Ativan [lorazepam]    Review of Systems   Review of Systems  Musculoskeletal:  Positive for arthralgias.  All other systems reviewed and are negative.   Physical Exam Updated Vital Signs BP (!) 207/124 (BP Location: Right Arm)   Pulse (!) 104   Temp 98.6 F (37 C) (Oral)   Resp 18   Ht 5' (1.524 m)   Wt 72.6 kg   LMP 01/20/2023 (Exact Date)   SpO2 100%   BMI 31.25 kg/m  Physical Exam Vitals and nursing note reviewed.  Constitutional:      General: She is not in acute distress.    Appearance: Normal appearance. She is not ill-appearing, toxic-appearing or diaphoretic.  HENT:     Head: Normocephalic and atraumatic.  Nose: Nose normal.     Mouth/Throat:     Mouth: Mucous membranes are moist.     Pharynx: Oropharynx is clear.  Eyes:     Extraocular Movements: Extraocular movements intact.     Conjunctiva/sclera: Conjunctivae normal.     Pupils: Pupils are equal, round, and reactive to light.  Cardiovascular:     Rate and Rhythm: Normal rate and regular rhythm.  Pulmonary:     Effort: Pulmonary effort is normal.     Breath sounds: Normal breath sounds.  Abdominal:     General: Abdomen is flat.     Tenderness: There is no abdominal tenderness.  Musculoskeletal:     Cervical back: Normal range of motion and neck supple. No tenderness.     Right  knee: Swelling and ecchymosis present. No deformity. Decreased range of motion. Tenderness present.     Comments: Right knee with slight soft tissue swelling, slightly reduced range of motion secondary to swelling.  No obvious deformity.  Patient can bear weight on her knee.  Skin:    General: Skin is warm and dry.     Capillary Refill: Capillary refill takes less than 2 seconds.  Neurological:     Mental Status: She is alert and oriented to person, place, and time.     ED Results / Procedures / Treatments   Labs (all labs ordered are listed, but only abnormal results are displayed) Labs Reviewed - No data to display  EKG None  Radiology DG Ankle Complete Right  Result Date: 01/25/2023 CLINICAL DATA:  Pain after fall EXAM: RIGHT ANKLE - COMPLETE 3 VIEW COMPARISON:  None Available. FINDINGS: Soft tissue swelling about the ankle. No fracture or dislocation. Preserved joint spaces and bone mineralization. IMPRESSION: Soft tissue swelling. Electronically Signed   By: Karen Kays M.D.   On: 01/25/2023 16:06   DG Knee Complete 4 Views Right  Result Date: 01/25/2023 CLINICAL DATA:  Status post meniscus repair 2 weeks ago. Status post fall EXAM: RIGHT KNEE - COMPLETE 4 VIEW COMPARISON:  None Available. FINDINGS: Large joint effusion on lateral view. Slight joint space loss of the medial compartment with moderate osteophytes. No acute fracture or dislocation. Preserved bone mineralization. If there is further concern of internal derangement, follow up MRI could be considered as clinically appropriate. IMPRESSION: Degenerative changes.  Large joint effusion. Electronically Signed   By: Karen Kays M.D.   On: 01/25/2023 16:05    Procedures Procedures   Medications Ordered in ED Medications  HYDROcodone-acetaminophen (NORCO/VICODIN) 5-325 MG per tablet 1 tablet (1 tablet Oral Given 01/25/23 1536)  cloNIDine (CATAPRES) tablet 0.1 mg (0.1 mg Oral Given 01/25/23 1645)    ED Course/ Medical Decision  Making/ A&P Clinical Course as of 01/25/23 1849  Mon Jan 25, 2023  1838 163/106 [CG]    Clinical Course User Index [CG] Al Decant, PA-C   Medical Decision Making Amount and/or Complexity of Data Reviewed Radiology: ordered.  Risk Prescription drug management.   37 year old female presents to ED for evaluation.  Please see HPI for further details.  On examination patient right knee is slightly swollen.  She has intact range of motion however it is slightly reduced secondary to swelling and pain.  She has no obvious deformity.  There is slight ecchymosis to her knee but no wounds or lacerations needing repair.  She can bear weight onto her right leg.  X-ray imaging of her right knee shows large effusion however no obvious or acute fractures.  Plain  film imaging of right ankle unremarkable except soft tissue swelling.  Patient case discussed with on-call PA for Rachel Moulds, who has stated she will PA who works with Dr. Luiz Blare call me.  [name], the PA who works with Dr. Luiz Blare, has called back and states that the patient can be given Ace wrap and she can follow-up in the office tomorrow or next week as needed.  She will call the office in the morning for an appointment.  This was discussed with the patient and she voiced understanding and agreement with this plan.  Prior to discharge, patient noted to be hypertensive and tachycardic.  Patient reports he takes clonidine 0.1 mg twice daily.  She was given her home dose of 0.1 clonidine and her blood pressure normalized to 163/100.  She denies chest pain, shortness of breath, blurred vision or headache.  She is requesting discharge.  At this time, the patient be discharged home.  She was given return precautions and she voiced understanding.  She had all her questions answered to her satisfaction.  She stable to discharge with this time.  Addendum: Prior to discharge patient requested cane to assist with ambulation.   She was shown to be ambulating independently with a steady gait throughout the department while she has been here.  We will provide patient crutches.   Final Clinical Impression(s) / ED Diagnoses Final diagnoses:  Injury of right knee, initial encounter    Rx / DC Orders ED Discharge Orders     None             Clent Ridges 01/25/23 1857    Lorre Nick, MD 01/26/23 1623

## 2023-01-25 NOTE — ED Triage Notes (Signed)
Patient is here for evaluation of right leg pain. Pt reports that she had a knee procedure done 2 weeks ago. Patient states she tripped and fell directly on the knee, swelling noted to the knee. Patient concerned that something is messed up in her knee now.

## 2023-01-25 NOTE — Discharge Instructions (Signed)
Return to the ED with any new or worsening signs or symptoms Please follow-up with Dr. Luiz Blare in the office tomorrow or next week as needed Please continue utilizing Ace wrap provided today for comfort

## 2023-01-26 ENCOUNTER — Other Ambulatory Visit: Payer: Self-pay | Admitting: Nurse Practitioner

## 2023-01-26 ENCOUNTER — Telehealth: Payer: Self-pay

## 2023-01-26 NOTE — Transitions of Care (Post Inpatient/ED Visit) (Signed)
   01/26/2023  Name: LAWRIE PENFOLD MRN: [id number] DOB: Oct 03, 1985  Today's TOC FU Call Status: Today's TOC FU Call Status:: Unsuccessul Call (1st Attempt) Unsuccessful Call (1st Attempt) Date: 01/26/23  Attempted to reach the patient regarding the most recent Inpatient/ED visit.  Follow Up Plan: Additional outreach attempts will be made to reach the patient to complete the Transitions of Care (Post Inpatient/ED visit) call.   Signature Renelda Loma RMA

## 2023-01-26 NOTE — Telephone Encounter (Signed)
Please advise KH 

## 2023-01-27 ENCOUNTER — Telehealth: Payer: Self-pay

## 2023-01-27 NOTE — Transitions of Care (Post Inpatient/ED Visit) (Signed)
   01/27/2023  Name: [name] MRN: [id number] DOB: Jan 20, 1986  Today's TOC FU Call Status: Today's TOC FU Call Status:: Unsuccessful Call (2nd Attempt) Unsuccessful Call (2nd Attempt) Date: 01/27/23  Attempted to reach the patient regarding the most recent Inpatient/ED visit.  Follow Up Plan: Additional outreach attempts will be made to reach the patient to complete the Transitions of Care (Post Inpatient/ED visit) call.   Signature Renelda Loma RMA

## 2023-01-28 ENCOUNTER — Telehealth: Payer: Self-pay

## 2023-01-28 NOTE — Transitions of Care (Post Inpatient/ED Visit) (Cosign Needed)
   01/28/2023  Name: [name] MRN: [id number] DOB: Sep 14, 1985  Today's TOC FU Call Status: Today's TOC FU Call Status:: Unsuccessful Call (3rd Attempt) Unsuccessful Call (3rd Attempt) Date: 01/28/23  Attempted to reach the patient regarding the most recent Inpatient/ED visit.  Follow Up Plan: No further outreach attempts will be made at this time. We have been unable to contact the patient.  Signature Renelda Loma RMA

## 2023-02-09 ENCOUNTER — Other Ambulatory Visit: Payer: Self-pay | Admitting: Nurse Practitioner

## 2023-02-09 DIAGNOSIS — F419 Anxiety disorder, unspecified: Secondary | ICD-10-CM

## 2023-02-10 NOTE — Telephone Encounter (Signed)
Please advise 

## 2023-03-27 ENCOUNTER — Other Ambulatory Visit: Payer: Self-pay | Admitting: Nurse Practitioner

## 2023-04-15 ENCOUNTER — Ambulatory Visit (INDEPENDENT_AMBULATORY_CARE_PROVIDER_SITE_OTHER): Payer: Medicaid Other | Admitting: Nurse Practitioner

## 2023-04-15 VITALS — BP 127/61 | HR 56 | Temp 97.1°F | Wt 178.0 lb

## 2023-04-15 DIAGNOSIS — R030 Elevated blood-pressure reading, without diagnosis of hypertension: Secondary | ICD-10-CM

## 2023-04-15 DIAGNOSIS — R609 Edema, unspecified: Secondary | ICD-10-CM | POA: Diagnosis not present

## 2023-04-15 DIAGNOSIS — F419 Anxiety disorder, unspecified: Secondary | ICD-10-CM

## 2023-04-15 DIAGNOSIS — E669 Obesity, unspecified: Secondary | ICD-10-CM | POA: Diagnosis not present

## 2023-04-15 DIAGNOSIS — F32A Depression, unspecified: Secondary | ICD-10-CM

## 2023-04-15 MED ORDER — HYDROXYZINE HCL 10 MG PO TABS: 10.0000 mg | ORAL_TABLET | Freq: Three times a day (TID) | ORAL | 0 refills | Status: DC | PRN

## 2023-04-15 MED ORDER — ESCITALOPRAM OXALATE 20 MG PO TABS
20.0000 mg | ORAL_TABLET | Freq: Every day | ORAL | 2 refills | Status: DC
Start: 2023-04-15 — End: 2023-05-17

## 2023-04-15 MED ORDER — BLOOD PRESSURE KIT
1.0000 | PACK | Freq: Every day | 0 refills | Status: AC
Start: 2023-04-15 — End: ?

## 2023-04-15 MED ORDER — HYDROCHLOROTHIAZIDE 12.5 MG PO CAPS
12.5000 mg | ORAL_CAPSULE | Freq: Every day | ORAL | 0 refills | Status: DC
Start: 2023-04-15 — End: 2023-05-12

## 2023-04-15 NOTE — Progress Notes (Signed)
@Patient  ID: [name], female    DOB: 09-Jan-1986, 37 y.o.   MRN: [id number]  Chief Complaint  Patient presents with   Anxiety    Referring provider: Ivonne Andrew, NP   HPI  Patient presents today for follow-up.  She states that she has ran out of her medicine.  She has been homeless for the past few months but now has housing.  We will refill Lexapro at 20 mg.  We will refill Atarax.  Patient has been having increased anxiety since running out of her medications.  We will also refer her to psychiatry.  Will check labs today.  Patient is also requesting a referral for weight management.  Patient has been having peripheral edema.  We will trial Microzide.  Blood pressure kit was sent to the pharmacy.  Patient was advised to check blood pressures at home. Denies f/c/s, n/v/d, hemoptysis, PND, leg swelling Denies chest pain or edema      Allergies  Allergen Reactions   Ibuprofen Anaphylaxis, Swelling and Other (See Comments)    Patient thinks it may have caused her throat to "swell"  (** PATIENT HAS BEEN TOLERATING THIS IN 2023**)   Latex Shortness Of Breath   Peanut-Containing Drug Products Anaphylaxis   Penicillins Anaphylaxis and Hives    Has patient had a PCN reaction causing immediate rash, facial/tongue/throat swelling, SOB or lightheadedness with hypotension: Yes Has patient had a PCN reaction causing severe rash involving mucus membranes or skin necrosis: unknown Has patient had a PCN reaction that required hospitalization No Has patient had a PCN reaction occurring within the last 10 years: No If all of the above answers are "NO", then may proceed with Cephalosporin use. Tolerated ceftriaxone 2018 & 2019    Acetaminophen Other (See Comments)    Causes patient to have an upset stomach- Tolerating this in 2023 (325 mg tablets)   Codeine Hives and Itching   Lisinopril Swelling   Ativan [Lorazepam] Other (See Comments)    "Hallucinations," per patient     Immunization History  Administered Date(s) Administered   Influenza,inj,Quad PF,6+ Mos 06/17/2015, 09/23/2016, 08/03/2017, 05/30/2022   Pneumococcal Polysaccharide-23 08/04/2017   Tdap 07/30/2013, 05/08/2016    Past Medical History:  Diagnosis Date   Anxiety    Cirrhosis of liver (HCC)    Cocaine use 08/04/2018   History of cocaine use 08/04/2018   Hypertension    Polysubstance abuse (HCC)    Renal disorder    Kidney Infection    Stroke (HCC)    x12    Tobacco History: Social History   Tobacco Use  Smoking Status Every Day   Current packs/day: 0.50   Types: Cigarettes  Smokeless Tobacco Never   Ready to quit: Not Answered Counseling given: Not Answered   Outpatient Encounter Medications as of 04/15/2023  Medication Sig   atorvastatin (LIPITOR) 40 MG tablet TAKE 1 TABLET(40 MG) BY MOUTH DAILY   Blood Pressure KIT 1 Application by Does not apply route daily.   buprenorphine-naloxone (SUBOXONE) 8-2 mg SUBL SL tablet Place 1 tablet under the tongue 3 (three) times daily.   cloNIDine (CATAPRES) 0.1 MG tablet TAKE 1 TABLET(0.1 MG) BY MOUTH TWICE DAILY   escitalopram (LEXAPRO) 20 MG tablet Take 1 tablet (20 mg total) by mouth daily.   hydrochlorothiazide (MICROZIDE) 12.5 MG capsule Take 1 capsule (12.5 mg total) by mouth daily.   [DISCONTINUED] hydrOXYzine (ATARAX) 10 MG tablet TAKE 1 TABLET(10 MG) BY MOUTH THREE TIMES DAILY AS NEEDED  cetirizine (ZYRTEC) 10 MG tablet Take 1 tablet (10 mg total) by mouth daily. (Patient not taking: Reported on 04/15/2023)   hydrOXYzine (ATARAX) 10 MG tablet Take 1 tablet (10 mg total) by mouth 3 (three) times daily as needed.   ondansetron (ZOFRAN) 8 MG tablet Take 1 tablet (8 mg total) by mouth every 8 (eight) hours as needed for nausea or vomiting. (Patient not taking: Reported on 04/15/2023)   pantoprazole (PROTONIX) 40 MG tablet TAKE 1 TABLET(40 MG) BY MOUTH DAILY (Patient not taking: Reported on 04/15/2023)   Vitamin D, Ergocalciferol,  (DRISDOL) 1.25 MG (50000 UNIT) CAPS capsule Take 1 capsule (50,000 Units total) by mouth every 7 (seven) days. (Patient not taking: Reported on 04/15/2023)   [DISCONTINUED] escitalopram (LEXAPRO) 10 MG tablet TAKE 1 TABLET(10 MG) BY MOUTH DAILY (Patient not taking: Reported on 04/15/2023)   No facility-administered encounter medications on file as of 04/15/2023.     Review of Systems  Review of Systems  Constitutional: Negative.   HENT: Negative.    Cardiovascular: Negative.   Gastrointestinal: Negative.   Allergic/Immunologic: Negative.   Neurological: Negative.   Psychiatric/Behavioral: Negative.         Physical Exam  BP 127/61   Pulse (!) 56   Temp (!) 97.1 F (36.2 C)   Wt 178 lb (80.7 kg)   SpO2 100%   BMI 34.76 kg/m   Wt Readings from Last 5 Encounters:  04/15/23 178 lb (80.7 kg)  01/25/23 160 lb (72.6 kg)  11/13/22 174 lb 9.6 oz (79.2 kg)  10/08/22 170 lb 8 oz (77.3 kg)  09/14/22 161 lb 12.8 oz (73.4 kg)     Physical Exam Vitals and nursing note reviewed.  Constitutional:      General: She is not in acute distress.    Appearance: She is well-developed.  Cardiovascular:     Rate and Rhythm: Normal rate and regular rhythm.  Pulmonary:     Effort: Pulmonary effort is normal.     Breath sounds: Normal breath sounds.  Neurological:     Mental Status: She is alert and oriented to person, place, and time.      Lab Results:  CBC    Component Value Date/Time   WBC 5.8 10/08/2022 1440   WBC 10.1 05/29/2022 0035   RBC 4.25 10/08/2022 1440   RBC 4.27 05/29/2022 0035   HGB 12.3 10/08/2022 1440   HCT 37.6 10/08/2022 1440   PLT 234 10/08/2022 1440   MCV 89 10/08/2022 1440   MCH 28.9 10/08/2022 1440   MCH 30.0 05/29/2022 0035   MCHC 32.7 10/08/2022 1440   MCHC 32.6 05/29/2022 0035   RDW 15.0 10/08/2022 1440   LYMPHSABS 1.6 05/29/2022 0035   MONOABS 0.8 05/29/2022 0035   EOSABS 0.1 05/29/2022 0035   BASOSABS 0.0 05/29/2022 0035    BMET     Component Value Date/Time   NA 139 10/08/2022 1440   K 3.7 10/08/2022 1440   CL 102 10/08/2022 1440   CO2 22 10/08/2022 1440   GLUCOSE 156 (H) 10/08/2022 1440   GLUCOSE 103 (H) 05/30/2022 0841   BUN 15 10/08/2022 1440   CREATININE 0.77 10/08/2022 1440   CREATININE 0.69 09/23/2016 1442   CALCIUM 8.7 10/08/2022 1440   GFRNONAA >60 05/30/2022 0841   GFRNONAA >89 09/23/2016 1442   GFRAA >60 05/09/2020 0524   GFRAA >89 09/23/2016 1442     Assessment & Plan:   Anxiety and depression - escitalopram (LEXAPRO) 20 MG tablet; Take 1 tablet (20  mg total) by mouth daily.  Dispense: 30 tablet; Refill: 2 - hydrOXYzine (ATARAX) 10 MG tablet; Take 1 tablet (10 mg total) by mouth 3 (three) times daily as needed.  Dispense: 30 tablet; Refill: 0 - Ambulatory referral to Psychiatry  2. Edema, unspecified type  - hydrochlorothiazide (MICROZIDE) 12.5 MG capsule; Take 1 capsule (12.5 mg total) by mouth daily.  Dispense: 30 capsule; Refill: 0  3. Elevated blood pressure reading  - Blood Pressure KIT; 1 Application by Does not apply route daily.  Dispense: 1 kit; Refill: 0  4. Obesity (BMI 30.0-34.9)  - Amb Ref to Medical Weight Management   Follow up:  Follow up in 3 months     Ivonne Andrew, NP 04/15/2023

## 2023-04-15 NOTE — Assessment & Plan Note (Signed)
-   escitalopram (LEXAPRO) 20 MG tablet; Take 1 tablet (20 mg total) by mouth daily.  Dispense: 30 tablet; Refill: 2 - hydrOXYzine (ATARAX) 10 MG tablet; Take 1 tablet (10 mg total) by mouth 3 (three) times daily as needed.  Dispense: 30 tablet; Refill: 0 - Ambulatory referral to Psychiatry  2. Edema, unspecified type  - hydrochlorothiazide (MICROZIDE) 12.5 MG capsule; Take 1 capsule (12.5 mg total) by mouth daily.  Dispense: 30 capsule; Refill: 0  3. Elevated blood pressure reading  - Blood Pressure KIT; 1 Application by Does not apply route daily.  Dispense: 1 kit; Refill: 0  4. Obesity (BMI 30.0-34.9)  - Amb Ref to Medical Weight Management   Follow up:  Follow up in 3 months

## 2023-04-15 NOTE — Assessment & Plan Note (Signed)
>>  ASSESSMENT AND PLAN FOR ANXIETY AND DEPRESSION WRITTEN ON 04/15/2023 11:54 AM BY NICHOLS, TONYA S, NP  - escitalopram (LEXAPRO) 20 MG tablet; Take 1 tablet (20 mg total) by mouth daily.  Dispense: 30 tablet; Refill: 2 - hydrOXYzine (ATARAX) 10 MG tablet; Take 1 tablet (10 mg total) by mouth 3 (three) times daily as needed.  Dispense: 30 tablet; Refill: 0 - Ambulatory referral to Psychiatry  2. Edema, unspecified type  - hydrochlorothiazide (MICROZIDE) 12.5 MG capsule; Take 1 capsule (12.5 mg total) by mouth daily.  Dispense: 30 capsule; Refill: 0  3. Elevated blood pressure reading  - Blood Pressure KIT; 1 Application by Does not apply route daily.  Dispense: 1 kit; Refill: 0  4. Obesity (BMI 30.0-34.9)  - Amb Ref to Medical Weight Management   Follow up:  Follow up in 3 months

## 2023-04-15 NOTE — Patient Instructions (Addendum)
1. Anxiety and depression  - escitalopram (LEXAPRO) 20 MG tablet; Take 1 tablet (20 mg total) by mouth daily.  Dispense: 30 tablet; Refill: 2 - hydrOXYzine (ATARAX) 10 MG tablet; Take 1 tablet (10 mg total) by mouth 3 (three) times daily as needed.  Dispense: 30 tablet; Refill: 0 - Ambulatory referral to Psychiatry  2. Edema, unspecified type  - hydrochlorothiazide (MICROZIDE) 12.5 MG capsule; Take 1 capsule (12.5 mg total) by mouth daily.  Dispense: 30 capsule; Refill: 0  3. Elevated blood pressure reading  - Blood Pressure KIT; 1 Application by Does not apply route daily.  Dispense: 1 kit; Refill: 0  4. Obesity (BMI 30.0-34.9)  - Amb Ref to Medical Weight Management   Follow up:  Follow up in 3 months

## 2023-04-16 LAB — COMPREHENSIVE METABOLIC PANEL
ALT: 29 IU/L (ref 0–32)
AST: 25 IU/L (ref 0–40)
Albumin: 3.9 g/dL (ref 3.9–4.9)
Alkaline Phosphatase: 120 IU/L (ref 44–121)
BUN/Creatinine Ratio: 16 (ref 9–23)
BUN: 13 mg/dL (ref 6–20)
Bilirubin Total: <0.2 mg/dL (ref 0.0–1.2)
CO2: 24 mmol/L (ref 20–29)
Calcium: 9.6 mg/dL (ref 8.7–10.2)
Chloride: 108 mmol/L — ABNORMAL HIGH (ref 96–106)
Creatinine, Ser: 0.82 mg/dL (ref 0.57–1.00)
Globulin, Total: 2.2 g/dL (ref 1.5–4.5)
Glucose: 109 mg/dL — ABNORMAL HIGH (ref 70–99)
Potassium: 4.3 mmol/L (ref 3.5–5.2)
Sodium: 144 mmol/L (ref 134–144)
Total Protein: 6.1 g/dL (ref 6.0–8.5)
eGFR: 93 mL/min/{1.73_m2} (ref >59)

## 2023-04-16 LAB — CBC
Hematocrit: 39.4 % (ref 34.0–46.6)
Hemoglobin: 12.6 g/dL (ref 11.1–15.9)
MCH: 29.2 pg (ref 26.6–33.0)
MCHC: 32 g/dL (ref 31.5–35.7)
MCV: 91 fL (ref 79–97)
Platelets: 254 10*3/uL (ref 150–450)
RBC: 4.32 x10E6/uL (ref 3.77–5.28)
RDW: 14.7 % (ref 11.7–15.4)
WBC: 6.3 10*3/uL (ref 3.4–10.8)

## 2023-04-20 ENCOUNTER — Telehealth: Payer: Self-pay | Admitting: Nurse Practitioner

## 2023-04-20 NOTE — Telephone Encounter (Signed)
Pt called asked if she can get a 30 day supply  Hydroxyzine  Also if she can have antibiotic for a tooth abscess.

## 2023-04-21 ENCOUNTER — Other Ambulatory Visit: Payer: Self-pay | Admitting: Nurse Practitioner

## 2023-04-21 DIAGNOSIS — F32A Depression, unspecified: Secondary | ICD-10-CM

## 2023-04-21 DIAGNOSIS — K047 Periapical abscess without sinus: Secondary | ICD-10-CM

## 2023-04-21 MED ORDER — CLINDAMYCIN HCL 300 MG PO CAPS
300.0000 mg | ORAL_CAPSULE | Freq: Three times a day (TID) | ORAL | 0 refills | Status: AC
Start: 2023-04-21 — End: 2023-05-01

## 2023-04-21 MED ORDER — HYDROXYZINE HCL 10 MG PO TABS: 10.0000 mg | ORAL_TABLET | Freq: Three times a day (TID) | ORAL | 0 refills | Status: DC | PRN

## 2023-04-27 ENCOUNTER — Telehealth: Payer: Self-pay | Admitting: Nurse Practitioner

## 2023-04-27 ENCOUNTER — Other Ambulatory Visit: Payer: Self-pay | Admitting: Nurse Practitioner

## 2023-04-27 DIAGNOSIS — F419 Anxiety disorder, unspecified: Secondary | ICD-10-CM

## 2023-04-27 DIAGNOSIS — F32A Depression, unspecified: Secondary | ICD-10-CM

## 2023-04-27 NOTE — Telephone Encounter (Signed)
Please advise Kh 

## 2023-04-27 NOTE — Telephone Encounter (Signed)
Pt called and said due to their INS and Transportation the need her med's to have the dosage EVERY MONTH

## 2023-05-04 NOTE — Telephone Encounter (Signed)
Lvm for pt to call back to find out more info concerning dose and medication name.  Pt was advise to call back or send it on my chart. Kh

## 2023-05-12 ENCOUNTER — Other Ambulatory Visit: Payer: Self-pay | Admitting: Nurse Practitioner

## 2023-05-12 DIAGNOSIS — R609 Edema, unspecified: Secondary | ICD-10-CM

## 2023-05-17 ENCOUNTER — Ambulatory Visit (INDEPENDENT_AMBULATORY_CARE_PROVIDER_SITE_OTHER): Payer: Medicaid Other | Admitting: Nurse Practitioner

## 2023-05-17 ENCOUNTER — Encounter: Payer: Self-pay | Admitting: Nurse Practitioner

## 2023-05-17 ENCOUNTER — Ambulatory Visit: Payer: Self-pay | Admitting: Nurse Practitioner

## 2023-05-17 VITALS — BP 140/76 | HR 68 | Wt 177.0 lb

## 2023-05-17 DIAGNOSIS — N939 Abnormal uterine and vaginal bleeding, unspecified: Secondary | ICD-10-CM | POA: Diagnosis not present

## 2023-05-17 DIAGNOSIS — I10 Essential (primary) hypertension: Secondary | ICD-10-CM

## 2023-05-17 DIAGNOSIS — F419 Anxiety disorder, unspecified: Secondary | ICD-10-CM

## 2023-05-17 DIAGNOSIS — K760 Fatty (change of) liver, not elsewhere classified: Secondary | ICD-10-CM | POA: Diagnosis not present

## 2023-05-17 DIAGNOSIS — R002 Palpitations: Secondary | ICD-10-CM

## 2023-05-17 DIAGNOSIS — R600 Localized edema: Secondary | ICD-10-CM | POA: Diagnosis not present

## 2023-05-17 DIAGNOSIS — F32A Depression, unspecified: Secondary | ICD-10-CM

## 2023-05-17 DIAGNOSIS — R0602 Shortness of breath: Secondary | ICD-10-CM

## 2023-05-17 MED ORDER — HYDROXYZINE HCL 10 MG PO TABS: 10.0000 mg | ORAL_TABLET | Freq: Three times a day (TID) | ORAL | 0 refills | Status: DC | PRN

## 2023-05-17 MED ORDER — FUROSEMIDE 20 MG PO TABS
20.0000 mg | ORAL_TABLET | Freq: Every day | ORAL | 0 refills | Status: DC
Start: 2023-05-17 — End: 2023-06-04

## 2023-05-17 MED ORDER — ESCITALOPRAM OXALATE 20 MG PO TABS: 20.0000 mg | ORAL_TABLET | Freq: Every day | ORAL | 2 refills | Status: DC

## 2023-05-17 MED ORDER — HYDROCHLOROTHIAZIDE 25 MG PO TABS
25.0000 mg | ORAL_TABLET | Freq: Every day | ORAL | 3 refills | Status: DC
Start: 2023-05-17 — End: 2023-09-03

## 2023-05-17 NOTE — Progress Notes (Signed)
Subjective   Patient ID: [name], female    DOB: Jun 19, 1986, 37 y.o.   MRN: [id number]  Chief Complaint  Patient presents with   Leg Swelling   Medication Refill    Wanting monthly supply    Referring provider: Ivonne Andrew, NP  [name] is a 37 y.o. female with Past Medical History: No date: Anxiety No date: Cirrhosis of liver (HCC) 08/04/2018: Cocaine use 08/04/2018: History of cocaine use No date: Hypertension No date: Polysubstance abuse (HCC) No date: Renal disorder     Comment:  Kidney Infection  No date: Stroke [facility])     Comment:  x12   HPI  Patient presents today for acute visit.  She states that she has been having increased edema to her lower extremities.  It does appear that she has had a 18 pound weight increase since her last weight here in June.  She is on HCTZ 12.5 mg but states that this is not helping.  We discussed that we will give her Lasix over the next 3 days and place urgent referral for cardiology.  We will also check labs today including BNP.  Once patient completes Lasix she may start HCTZ at 25 mg daily until seen by cardiology.  Blood pressure was elevated in office today.  Patient states that she has been having associated shortness of breath and heart palpitations.  Patient is requested a new referral be placed to GI for fatty liver.  She prefers [facility] if possible.  We will place referral today.  Patient is also requesting a referral to OB/GYN for abnormal uterine bleeding.  This has been a chronic issue and patient has seen OB/GYN for this in the past but did lose her insurance for a while and has not been able to get a follow-up appointment.  Will place referral today.  Patient does also need a refill on hydroxyzine.  We will send refill to pharmacy today.  Denies f/c/s, n/v/d, hemoptysis, PND    Allergies  Allergen Reactions   Ibuprofen Anaphylaxis, Swelling and Other (See Comments)    Patient thinks it may have  caused her throat to "swell"  (** PATIENT HAS BEEN TOLERATING THIS IN 2023**)   Latex Shortness Of Breath   Peanut-Containing Drug Products Anaphylaxis   Penicillins Anaphylaxis and Hives    Has patient had a PCN reaction causing immediate rash, facial/tongue/throat swelling, SOB or lightheadedness with hypotension: Yes Has patient had a PCN reaction causing severe rash involving mucus membranes or skin necrosis: unknown Has patient had a PCN reaction that required hospitalization No Has patient had a PCN reaction occurring within the last 10 years: No If all of the above answers are "NO", then may proceed with Cephalosporin use. Tolerated ceftriaxone 2018 & 2019    Acetaminophen Other (See Comments)    Causes patient to have an upset stomach- Tolerating this in 2023 (325 mg tablets)   Codeine Hives and Itching   Lisinopril Swelling   Ativan [Lorazepam] Other (See Comments)    "Hallucinations," per patient    Immunization History  Administered Date(s) Administered   Influenza,inj,Quad PF,6+ Mos 06/17/2015, 09/23/2016, 08/03/2017, 05/30/2022   Pneumococcal Polysaccharide-23 08/04/2017   Tdap 07/30/2013, 05/08/2016    Tobacco History: Social History   Tobacco Use  Smoking Status Every Day   Current packs/day: 0.50   Types: Cigarettes  Smokeless Tobacco Never   Ready to quit: Not Answered Counseling given: Not Answered   Outpatient Encounter Medications as of  05/17/2023  Medication Sig   atorvastatin (LIPITOR) 40 MG tablet TAKE 1 TABLET(40 MG) BY MOUTH DAILY   Blood Pressure KIT 1 Application by Does not apply route daily.   buprenorphine-naloxone (SUBOXONE) 8-2 mg SUBL SL tablet Place 1 tablet under the tongue 3 (three) times daily.   cetirizine (ZYRTEC) 10 MG tablet Take 1 tablet (10 mg total) by mouth daily.   cloNIDine (CATAPRES) 0.1 MG tablet TAKE 1 TABLET(0.1 MG) BY MOUTH TWICE DAILY   furosemide (LASIX) 20 MG tablet Take 1 tablet (20 mg total) by mouth daily for 3  days.   hydrochlorothiazide (HYDRODIURIL) 25 MG tablet Take 1 tablet (25 mg total) by mouth daily.   ondansetron (ZOFRAN) 8 MG tablet Take 1 tablet (8 mg total) by mouth every 8 (eight) hours as needed for nausea or vomiting.   pantoprazole (PROTONIX) 40 MG tablet TAKE 1 TABLET(40 MG) BY MOUTH DAILY   Vitamin D, Ergocalciferol, (DRISDOL) 1.25 MG (50000 UNIT) CAPS capsule Take 1 capsule (50,000 Units total) by mouth every 7 (seven) days.   [DISCONTINUED] escitalopram (LEXAPRO) 20 MG tablet Take 1 tablet (20 mg total) by mouth daily.   [DISCONTINUED] hydrochlorothiazide (MICROZIDE) 12.5 MG capsule TAKE 1 CAPSULE(12.5 MG) BY MOUTH DAILY   [DISCONTINUED] hydrOXYzine (ATARAX) 10 MG tablet TAKE 1 TABLET(10 MG) BY MOUTH THREE TIMES DAILY AS NEEDED   escitalopram (LEXAPRO) 20 MG tablet Take 1 tablet (20 mg total) by mouth daily.   hydrOXYzine (ATARAX) 10 MG tablet Take 1 tablet (10 mg total) by mouth 3 (three) times daily as needed.   No facility-administered encounter medications on file as of 05/17/2023.    Review of Systems  Review of Systems  Constitutional: Negative.   HENT: Negative.    Respiratory:  Positive for shortness of breath.   Cardiovascular:  Positive for palpitations and leg swelling.  Gastrointestinal: Negative.   Allergic/Immunologic: Negative.   Neurological: Negative.   Psychiatric/Behavioral: Negative.       Objective:   BP (!) 140/76   Pulse 68   Wt 177 lb (80.3 kg)   SpO2 98%   BMI 34.57 kg/m   Wt Readings from Last 5 Encounters:  05/17/23 177 lb (80.3 kg)  04/15/23 178 lb (80.7 kg)  01/25/23 160 lb (72.6 kg)  11/13/22 174 lb 9.6 oz (79.2 kg)  10/08/22 170 lb 8 oz (77.3 kg)     Physical Exam Vitals and nursing note reviewed.  Constitutional:      General: She is not in acute distress.    Appearance: She is well-developed.  Cardiovascular:     Rate and Rhythm: Normal rate and regular rhythm.  Pulmonary:     Effort: Pulmonary effort is normal.      Breath sounds: Normal breath sounds.  Musculoskeletal:     Right lower leg: Edema present.     Left lower leg: Edema present.  Neurological:     Mental Status: She is alert and oriented to person, place, and time.       Assessment & Plan:   Abnormal uterine bleeding -     Ambulatory referral to Obstetrics / Gynecology  Fatty liver -     Ambulatory referral to Gastroenterology  Primary hypertension -     CBC -     Comprehensive metabolic panel -     Brain natriuretic peptide -     Ambulatory referral to Cardiology -     hydroCHLOROthiazide; Take 1 tablet (25 mg total) by mouth daily.  Dispense: 90 tablet;  Refill: 3  Peripheral edema -     CBC -     Comprehensive metabolic panel -     Brain natriuretic peptide -     Ambulatory referral to Cardiology -     Furosemide; Take 1 tablet (20 mg total) by mouth daily for 3 days.  Dispense: 3 tablet; Refill: 0  Shortness of breath -     Ambulatory referral to Cardiology  Heart palpitations -     Ambulatory referral to Cardiology  Anxiety and depression -     hydrOXYzine HCl; Take 1 tablet (10 mg total) by mouth 3 (three) times daily as needed.  Dispense: 30 tablet; Refill: 0 -     Escitalopram Oxalate; Take 1 tablet (20 mg total) by mouth daily.  Dispense: 30 tablet; Refill: 2     Return in about 3 months (around 08/16/2023).   Ivonne Andrew, NP 05/17/2023

## 2023-05-17 NOTE — Patient Instructions (Signed)
1. Abnormal uterine bleeding  - Ambulatory referral to Obstetrics / Gynecology  2. Fatty liver  - Ambulatory referral to Gastroenterology  3. Primary hypertension  - CBC - Comprehensive metabolic panel - Brain natriuretic peptide - Ambulatory referral to Cardiology - hydrochlorothiazide (HYDRODIURIL) 25 MG tablet; Take 1 tablet (25 mg total) by mouth daily.  Dispense: 90 tablet; Refill: 3  4. Peripheral edema  - CBC - Comprehensive metabolic panel - Brain natriuretic peptide - Ambulatory referral to Cardiology - furosemide (LASIX) 20 MG tablet; Take 1 tablet (20 mg total) by mouth daily for 3 days.  Dispense: 3 tablet; Refill: 0  5. Shortness of breath  - Ambulatory referral to Cardiology  6. Heart palpitations  - Ambulatory referral to Cardiology  7. Anxiety and depression  - hydrOXYzine (ATARAX) 10 MG tablet; Take 1 tablet (10 mg total) by mouth 3 (three) times daily as needed.  Dispense: 30 tablet; Refill: 0 - escitalopram (LEXAPRO) 20 MG tablet; Take 1 tablet (20 mg total) by mouth daily.  Dispense: 30 tablet; Refill: 2

## 2023-05-17 NOTE — Progress Notes (Signed)
Pain : no Tiredness  Falls no   Depression present (no SI)

## 2023-05-19 LAB — CBC
Hematocrit: 37.6 % (ref 34.0–46.6)
Hemoglobin: 11.8 g/dL (ref 11.1–15.9)
MCH: 29.9 pg (ref 26.6–33.0)
MCHC: 31.4 g/dL — ABNORMAL LOW (ref 31.5–35.7)
MCV: 95 fL (ref 79–97)
Platelets: 195 10*3/uL (ref 150–450)
RBC: 3.95 x10E6/uL (ref 3.77–5.28)
RDW: 14.7 % (ref 11.7–15.4)
WBC: 5.5 10*3/uL (ref 3.4–10.8)

## 2023-05-19 LAB — COMPREHENSIVE METABOLIC PANEL
ALT: 25 IU/L (ref 0–32)
AST: 21 IU/L (ref 0–40)
Albumin: 4 g/dL (ref 3.9–4.9)
Alkaline Phosphatase: 111 IU/L (ref 44–121)
BUN/Creatinine Ratio: 16 (ref 9–23)
BUN: 11 mg/dL (ref 6–20)
Bilirubin Total: <0.2 mg/dL (ref 0.0–1.2)
CO2: 21 mmol/L (ref 20–29)
Calcium: 9 mg/dL (ref 8.7–10.2)
Chloride: 107 mmol/L — ABNORMAL HIGH (ref 96–106)
Creatinine, Ser: 0.68 mg/dL (ref 0.57–1.00)
Globulin, Total: 2.1 g/dL (ref 1.5–4.5)
Glucose: 110 mg/dL — ABNORMAL HIGH (ref 70–99)
Potassium: 4.4 mmol/L (ref 3.5–5.2)
Sodium: 142 mmol/L (ref 134–144)
Total Protein: 6.1 g/dL (ref 6.0–8.5)
eGFR: 114 mL/min/{1.73_m2} (ref >59)

## 2023-05-19 LAB — BRAIN NATRIURETIC PEPTIDE: BNP: 36.2 pg/mL (ref 0.0–100.0)

## 2023-05-20 ENCOUNTER — Other Ambulatory Visit: Payer: Self-pay | Admitting: Nurse Practitioner

## 2023-05-20 DIAGNOSIS — F419 Anxiety disorder, unspecified: Secondary | ICD-10-CM

## 2023-05-20 MED ORDER — HYDROXYZINE HCL 10 MG PO TABS: 10.0000 mg | ORAL_TABLET | Freq: Three times a day (TID) | ORAL | 0 refills | Status: DC | PRN

## 2023-05-20 MED ORDER — ESCITALOPRAM OXALATE 20 MG PO TABS
20.0000 mg | ORAL_TABLET | Freq: Every day | ORAL | 2 refills | Status: DC
Start: 2023-05-20 — End: 2023-09-03

## 2023-05-24 ENCOUNTER — Encounter: Payer: Self-pay | Admitting: Nurse Practitioner

## 2023-05-24 ENCOUNTER — Ambulatory Visit (INDEPENDENT_AMBULATORY_CARE_PROVIDER_SITE_OTHER): Payer: Medicaid Other | Admitting: Nurse Practitioner

## 2023-05-24 VITALS — BP 119/75 | HR 74 | Temp 97.0°F | Wt 173.0 lb

## 2023-05-24 DIAGNOSIS — I10 Essential (primary) hypertension: Secondary | ICD-10-CM | POA: Diagnosis not present

## 2023-05-24 DIAGNOSIS — F32A Depression, unspecified: Secondary | ICD-10-CM

## 2023-05-24 DIAGNOSIS — R7301 Impaired fasting glucose: Secondary | ICD-10-CM | POA: Diagnosis not present

## 2023-05-24 DIAGNOSIS — R5383 Other fatigue: Secondary | ICD-10-CM | POA: Diagnosis not present

## 2023-05-24 DIAGNOSIS — E538 Deficiency of other specified B group vitamins: Secondary | ICD-10-CM

## 2023-05-24 DIAGNOSIS — F419 Anxiety disorder, unspecified: Secondary | ICD-10-CM

## 2023-05-24 DIAGNOSIS — Z23 Encounter for immunization: Secondary | ICD-10-CM

## 2023-05-24 LAB — POCT GLYCOSYLATED HEMOGLOBIN (HGB A1C): Hemoglobin A1C: 5.4 % (ref 4.0–5.6)

## 2023-05-24 MED ORDER — HYDROXYZINE HCL 10 MG PO TABS: 10.0000 mg | ORAL_TABLET | Freq: Three times a day (TID) | ORAL | 2 refills | Status: DC | PRN

## 2023-05-24 MED ORDER — CYANOCOBALAMIN 1000 MCG/ML IJ SOLN
1000.0000 ug | Freq: Once | INTRAMUSCULAR | Status: AC
Start: 2023-05-24 — End: 2023-05-24
  Administered 2023-05-24: 1000 ug via INTRAMUSCULAR

## 2023-05-24 NOTE — Patient Instructions (Signed)
1. Vitamin B12 deficiency  - Vitamin B12  2. Primary hypertension  - CBC - Basic Metabolic Panel  3. Need for influenza vaccination  - Flu vaccine trivalent PF, 6mos and older(Flulaval,Afluria,Fluarix,Fluzone)  4. Other fatigue  - cyanocobalamin (VITAMIN B12) injection 1,000 mcg  Follow up:  Follow up in 3 months

## 2023-05-24 NOTE — Progress Notes (Signed)
Subjective   Patient ID: [name], female    DOB: [birth year], 37 y.o.   MRN: [id number]  Chief Complaint  Patient presents with   Medical Management of Chronic Issues   B12 Injection    Referring provider: Ivonne Andrew, NP  [name] is a 37 y.o. female with Past Medical History: No date: Anxiety No date: Cirrhosis of liver (HCC) 08/04/2018: Cocaine use 08/04/2018: History of cocaine use No date: Hypertension No date: Polysubstance abuse (HCC) No date: Renal disorder     Comment:  Kidney Infection  No date: Stroke [facility])     Comment:  x12   HPI  Patient presents today requesting vitamin B12 injection.  We discussed that we will need to check levels.  Since she is here in the office we will go ahead and give her an injection due to fatigue.  She did not does want flu vaccine as well.  We will check labs today including A1c due to glucose levels being elevated recently. Denies f/c/s, n/v/d, hemoptysis, PND, leg swelling Denies chest pain or edema    Allergies  Allergen Reactions   Ibuprofen Anaphylaxis, Swelling and Other (See Comments)    Patient thinks it may have caused her throat to "swell"  (** PATIENT HAS BEEN TOLERATING THIS IN 2023**)   Latex Shortness Of Breath   Peanut-Containing Drug Products Anaphylaxis   Penicillins Anaphylaxis and Hives    Has patient had a PCN reaction causing immediate rash, facial/tongue/throat swelling, SOB or lightheadedness with hypotension: Yes Has patient had a PCN reaction causing severe rash involving mucus membranes or skin necrosis: unknown Has patient had a PCN reaction that required hospitalization No Has patient had a PCN reaction occurring within the last 10 years: No If all of the above answers are "NO", then may proceed with Cephalosporin use. Tolerated ceftriaxone 2018 & 2019    Acetaminophen Other (See Comments)    Causes patient to have an upset stomach- Tolerating this in 2023 (325 mg tablets)    Codeine Hives and Itching   Lisinopril Swelling   Ativan [Lorazepam] Other (See Comments)    "Hallucinations," per patient    Immunization History  Administered Date(s) Administered   Influenza,inj,Quad PF,6+ Mos 06/17/2015, 09/23/2016, 08/03/2017, 05/30/2022   Pneumococcal Polysaccharide-23 08/04/2017   Tdap 07/30/2013, 05/08/2016    Tobacco History: Social History   Tobacco Use  Smoking Status Every Day   Current packs/day: 0.50   Types: Cigarettes  Smokeless Tobacco Never   Ready to quit: Not Answered Counseling given: Not Answered   Outpatient Encounter Medications as of 05/24/2023  Medication Sig   atorvastatin (LIPITOR) 40 MG tablet TAKE 1 TABLET(40 MG) BY MOUTH DAILY   Blood Pressure KIT 1 Application by Does not apply route daily.   buprenorphine-naloxone (SUBOXONE) 8-2 mg SUBL SL tablet Place 1 tablet under the tongue 3 (three) times daily.   cetirizine (ZYRTEC) 10 MG tablet Take 1 tablet (10 mg total) by mouth daily.   cloNIDine (CATAPRES) 0.1 MG tablet TAKE 1 TABLET(0.1 MG) BY MOUTH TWICE DAILY   escitalopram (LEXAPRO) 20 MG tablet Take 1 tablet (20 mg total) by mouth daily.   furosemide (LASIX) 20 MG tablet Take 1 tablet (20 mg total) by mouth daily for 3 days.   hydrochlorothiazide (HYDRODIURIL) 25 MG tablet Take 1 tablet (25 mg total) by mouth daily.   ondansetron (ZOFRAN) 8 MG tablet Take 1 tablet (8 mg total) by mouth every 8 (eight) hours as needed for nausea  or vomiting.   pantoprazole (PROTONIX) 40 MG tablet TAKE 1 TABLET(40 MG) BY MOUTH DAILY   Vitamin D, Ergocalciferol, (DRISDOL) 1.25 MG (50000 UNIT) CAPS capsule Take 1 capsule (50,000 Units total) by mouth every 7 (seven) days.   [DISCONTINUED] hydrOXYzine (ATARAX) 10 MG tablet Take 1 tablet (10 mg total) by mouth 3 (three) times daily as needed.   hydrOXYzine (ATARAX) 10 MG tablet Take 1 tablet (10 mg total) by mouth 3 (three) times daily as needed.   Facility-Administered Encounter Medications as of  05/24/2023  Medication   cyanocobalamin (VITAMIN B12) injection 1,000 mcg    Review of Systems  Review of Systems  Constitutional: Negative.   HENT: Negative.    Cardiovascular: Negative.   Gastrointestinal: Negative.   Allergic/Immunologic: Negative.   Neurological: Negative.   Psychiatric/Behavioral: Negative.       Objective:   BP 119/75   Pulse 74   Temp (!) 97 F (36.1 C)   Wt 173 lb (78.5 kg)   SpO2 99%   BMI 33.79 kg/m   Wt Readings from Last 5 Encounters:  05/24/23 173 lb (78.5 kg)  05/17/23 177 lb (80.3 kg)  04/15/23 178 lb (80.7 kg)  01/25/23 160 lb (72.6 kg)  11/13/22 174 lb 9.6 oz (79.2 kg)     Physical Exam Vitals and nursing note reviewed.  Constitutional:      General: She is not in acute distress.    Appearance: She is well-developed.  Cardiovascular:     Rate and Rhythm: Normal rate and regular rhythm.  Pulmonary:     Effort: Pulmonary effort is normal.     Breath sounds: Normal breath sounds.  Neurological:     Mental Status: She is alert and oriented to person, place, and time.       Assessment & Plan:   Vitamin B12 deficiency -     Vitamin B12  Primary hypertension -     CBC -     Basic metabolic panel  Need for influenza vaccination -     Flu vaccine trivalent PF, 6mos and older(Flulaval,Afluria,Fluarix,Fluzone)  Other fatigue -     Cyanocobalamin  Anxiety and depression -     hydrOXYzine HCl; Take 1 tablet (10 mg total) by mouth 3 (three) times daily as needed.  Dispense: 90 tablet; Refill: 2     Return in about 3 months (around 08/23/2023).   Ivonne Andrew, NP 05/24/2023

## 2023-05-25 LAB — BASIC METABOLIC PANEL
BUN/Creatinine Ratio: 22 (ref 9–23)
BUN: 15 mg/dL (ref 6–20)
CO2: 22 mmol/L (ref 20–29)
Calcium: 9.4 mg/dL (ref 8.7–10.2)
Chloride: 100 mmol/L (ref 96–106)
Creatinine, Ser: 0.69 mg/dL (ref 0.57–1.00)
Glucose: 111 mg/dL — ABNORMAL HIGH (ref 70–99)
Potassium: 4.4 mmol/L (ref 3.5–5.2)
Sodium: 139 mmol/L (ref 134–144)
eGFR: 113 mL/min/{1.73_m2} (ref >59)

## 2023-05-25 LAB — CBC
Hematocrit: 41.8 % (ref 34.0–46.6)
Hemoglobin: 13.2 g/dL (ref 11.1–15.9)
MCH: 29.7 pg (ref 26.6–33.0)
MCHC: 31.6 g/dL (ref 31.5–35.7)
MCV: 94 fL (ref 79–97)
Platelets: 256 10*3/uL (ref 150–450)
RBC: 4.44 x10E6/uL (ref 3.77–5.28)
RDW: 14.7 % (ref 11.7–15.4)
WBC: 7 10*3/uL (ref 3.4–10.8)

## 2023-05-25 LAB — VITAMIN B12: Vitamin B-12: 660 pg/mL (ref 232–1245)

## 2023-05-28 ENCOUNTER — Encounter: Payer: Self-pay | Admitting: Nurse Practitioner

## 2023-05-28 ENCOUNTER — Other Ambulatory Visit: Payer: Self-pay

## 2023-05-28 ENCOUNTER — Telehealth: Payer: Self-pay | Admitting: Nurse Practitioner

## 2023-05-28 DIAGNOSIS — Z1231 Encounter for screening mammogram for malignant neoplasm of breast: Secondary | ICD-10-CM

## 2023-05-28 NOTE — Telephone Encounter (Signed)
Done placed new order. Kh

## 2023-05-28 NOTE — Telephone Encounter (Signed)
Pt called stating she need a diagnostic mammogram referral sent to [facility]

## 2023-06-03 ENCOUNTER — Ambulatory Visit: Payer: Medicaid Other | Admitting: Neurology

## 2023-06-03 NOTE — Progress Notes (Deleted)
Patient: [name] Date of Birth: 1986/01/16  Reason for Visit: Follow up History from: Patient Primary Neurologist: Dr. Pearlean Brownie    ASSESSMENT AND PLAN 37 y.o. 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  Vascular risk factors of hypertension, hyperlipidemia, polysubstance abuse, cocaine, alcoholism and smoking     HISTORY OF PRESENT ILLNESS: Today 06/03/23 Saw Dr. Pearlean Brownie in Jan 2024 recommend continue Plavix 75 mg daily for secondary stroke prevention.  Referred for outpatient PT and OT.  Ordered Doppler bubble study for PFO.  Had a negative TCD bubble study.  HISTORY  09/14/22 Dr. Pearlean Brownie HPI: [name] is a 37 year old Caucasian lady with past medical history of hypertension, hyperlipidemia, anxiety, cirrhosis, polysubstance abuse, cocaine abuse, smoking who is seen today for initial office consultation visit.  She is accompanied by her mother.  History is obtained from her and review of electronic medical records.  I have personally reviewed pertinent available imaging films in PACS.  She initially presented on 03/21/2020 1 to 2-week history of numbness in the left hand in the right hand.  MRI showed multiple bilateral thalamic,left corona radiata/internal capsule lacunar infarcts.  Subacute.  He was hypertensive reported abusing cocaine off and on.  She left AGAINST MEDICAL ADVICE prior to completing stroke workup.  She returned on 05/06/2022.  Patient was admitted with abdominal pain suspected abdominal abscess and was on antibiotics awaiting surgery He was found to have right-sided weakness.  MRI scan of the spine showed right pontine, left putamen, right thalamus, corpus callosum and left frontal white matter infarcts.  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  Echocardiogram showed ejection fraction of 60 to 65% without clot.  Hemoglobin A1c  was 5.4 and LDL cholesterol was 84 mg percent.  RPR and HIV were negative.  He had known history of noncompliance with medications and follow-up.  She was started on aspirin and Plavix and statin for lipid.  States is done well since abnormal recurrent stroke or TIA symptoms.  She does feel that her walking is not back to baseline.  Also feels right side is weak and sometimes the right leg is tricky and at times gives away for no reason.  She does walk independently without a cane.  She does not have health insurance she did not seek medical follow-up but now states has since gotten health insurance.  REVIEW OF SYSTEMS: Out of a complete 14 system review of symptoms, the patient complains only of the following symptoms, and all other reviewed systems are negative.  See HPI  ALLERGIES: Allergies  Allergen Reactions   Ibuprofen Anaphylaxis, Swelling and Other (See Comments)    Patient thinks it may have caused her throat to "swell"  (** PATIENT HAS BEEN TOLERATING THIS IN 2023**)   Latex Shortness Of Breath   Peanut-Containing Drug Products Anaphylaxis   Penicillins Anaphylaxis and Hives    Has patient had a PCN reaction causing immediate rash, facial/tongue/throat swelling, SOB or lightheadedness with hypotension: Yes Has patient had a PCN reaction causing severe rash involving mucus membranes or skin necrosis: unknown Has patient had a PCN reaction that required hospitalization No Has patient had a PCN reaction occurring within the last 10 years: No If all of the above answers are "NO", then may proceed with Cephalosporin use. Tolerated ceftriaxone 2018 & 2019    Acetaminophen Other (See Comments)    Causes patient to have an upset  stomach- Tolerating this in 2023 (325 mg tablets)   Codeine Hives and Itching   Lisinopril Swelling   Ativan [Lorazepam] Other (See Comments)    "Hallucinations," per patient    HOME MEDICATIONS: Outpatient Medications Prior to Visit  Medication Sig  Dispense Refill   atorvastatin (LIPITOR) 40 MG tablet TAKE 1 TABLET(40 MG) BY MOUTH DAILY 90 tablet 1   Blood Pressure KIT 1 Application by Does not apply route daily. 1 kit 0   buprenorphine-naloxone (SUBOXONE) 8-2 mg SUBL SL tablet Place 1 tablet under the tongue 3 (three) times daily.     cetirizine (ZYRTEC) 10 MG tablet Take 1 tablet (10 mg total) by mouth daily. 30 tablet 11   cloNIDine (CATAPRES) 0.1 MG tablet TAKE 1 TABLET(0.1 MG) BY MOUTH TWICE DAILY 60 tablet 2   escitalopram (LEXAPRO) 20 MG tablet Take 1 tablet (20 mg total) by mouth daily. 30 tablet 2   furosemide (LASIX) 20 MG tablet Take 1 tablet (20 mg total) by mouth daily for 3 days. 3 tablet 0   hydrochlorothiazide (HYDRODIURIL) 25 MG tablet Take 1 tablet (25 mg total) by mouth daily. 90 tablet 3   hydrOXYzine (ATARAX) 10 MG tablet Take 1 tablet (10 mg total) by mouth 3 (three) times daily as needed. 90 tablet 2   ondansetron (ZOFRAN) 8 MG tablet Take 1 tablet (8 mg total) by mouth every 8 (eight) hours as needed for nausea or vomiting. 20 tablet 2   pantoprazole (PROTONIX) 40 MG tablet TAKE 1 TABLET(40 MG) BY MOUTH DAILY 90 tablet 0   Vitamin D, Ergocalciferol, (DRISDOL) 1.25 MG (50000 UNIT) CAPS capsule Take 1 capsule (50,000 Units total) by mouth every 7 (seven) days. 5 capsule 3   No facility-administered medications prior to visit.    PAST MEDICAL HISTORY: Past Medical History:  Diagnosis Date   Anxiety    Cirrhosis of liver (HCC)    Cocaine use 08/04/2018   History of cocaine use 08/04/2018   Hypertension    Polysubstance abuse (HCC)    Renal disorder    Kidney Infection    Stroke (HCC)    x12    PAST SURGICAL HISTORY: Past Surgical History:  Procedure Laterality Date   ARTERY REPAIR     CESAREAN SECTION     x 5   MOUTH SURGERY     TEAR DUCT PROBING     TUBAL LIGATION     VENTRAL HERNIA REPAIR N/A 10/03/2016   Procedure: OPEN INCARCERATED HERNIA REPAIR VENTRAL ADULT;  Surgeon: Axel Filler, MD;   Location: [facility] OR;  Service: General;  Laterality: N/A;    FAMILY HISTORY: Family History  Problem Relation Age of Onset   Aortic stenosis Mother    Stroke Father    Hyperlipidemia Father    Hypertension Father    Alpha-1 antitrypsin deficiency Brother    Heart disease Brother    CAD Other    Diabetes Other    Hypertension Other    Cancer Other    Thyroid disease Other     SOCIAL HISTORY: Social History   Socioeconomic History   Marital status: Single    Spouse name: Not on file   Number of children: Not on file   Years of education: Not on file   Highest education level: 9th grade  Occupational History   Not on file  Tobacco Use   Smoking status: Every Day    Current packs/day: 0.50    Types: Cigarettes   Smokeless tobacco: Never  Vaping  Use   Vaping status: Never Used  Substance and Sexual Activity   Alcohol use: Yes    Comment: daily   Drug use: Not Currently    Types: Cocaine   Sexual activity: Not on file  Other Topics Concern   Not on file  Social History Narrative   Not on file   Social Determinants of Health   Financial Resource Strain: High Risk (04/15/2023)   Overall Financial Resource Strain (CARDIA)    Difficulty of Paying Living Expenses: Very hard  Food Insecurity: Food Insecurity Present (04/15/2023)   Hunger Vital Sign    Worried About Running Out of Food in the Last Year: Often true    Ran Out of Food in the Last Year: Often true  Transportation Needs: Unmet Transportation Needs (04/15/2023)   PRAPARE - Administrator, Civil Service (Medical): Yes    Lack of Transportation (Non-Medical): Yes  Physical Activity: Not on file  Stress: Not on file  Social Connections: Unknown (04/15/2023)   Social Connection and Isolation Panel [NHANES]    Frequency of Communication with Friends and Family: More than three times a week    Frequency of Social Gatherings with Friends and Family: Three times a week    Attends Religious Services:  Patient declined    Active Member of Clubs or Organizations: No    Attends Banker Meetings: Not on file    Marital Status: Never married  Intimate Partner Violence: Not At Risk (11/13/2022)   Humiliation, Afraid, Rape, and Kick questionnaire    Fear of Current or Ex-Partner: No    Emotionally Abused: No    Physically Abused: No    Sexually Abused: No    PHYSICAL EXAM  There were no vitals filed for this visit. There is no height or weight on file to calculate BMI.  Generalized: Well developed, in no acute distress  Neurological examination  Mentation: Alert oriented to time, place, history taking. Follows all commands speech and language fluent Cranial nerve II-XII: Pupils were equal round reactive to light. Extraocular movements were full, visual field were full on confrontational test. Facial sensation and strength were normal. Uvula tongue midline. Head turning and shoulder shrug  were normal and symmetric. Motor: The motor testing reveals 5 over 5 strength of all 4 extremities. Good symmetric motor tone is noted throughout.  Sensory: Sensory testing is intact to soft touch on all 4 extremities. No evidence of extinction is noted.  Coordination: Cerebellar testing reveals good finger-nose-finger and heel-to-shin bilaterally.  Gait and station: Gait is normal. Tandem gait is normal. Romberg is negative. No drift is seen.  Reflexes: Deep tendon reflexes are symmetric and normal bilaterally.   DIAGNOSTIC DATA (LABS, IMAGING, TESTING) - I reviewed patient records, labs, notes, testing and imaging myself where available.  Lab Results  Component Value Date   WBC 7.0 05/24/2023   HGB 13.2 05/24/2023   HCT 41.8 05/24/2023   MCV 94 05/24/2023   PLT 256 05/24/2023      Component Value Date/Time   NA 139 05/24/2023 0854   K 4.4 05/24/2023 0854   CL 100 05/24/2023 0854   CO2 22 05/24/2023 0854   GLUCOSE 111 (H) 05/24/2023 0854   GLUCOSE 103 (H) 05/30/2022 0841   BUN  15 05/24/2023 0854   CREATININE 0.69 05/24/2023 0854   CREATININE 0.69 09/23/2016 1442   CALCIUM 9.4 05/24/2023 0854   PROT 6.1 05/17/2023 0844   ALBUMIN 4.0 05/17/2023 0844   AST 21  05/17/2023 0844   ALT 25 05/17/2023 0844   ALKPHOS 111 05/17/2023 0844   BILITOT <0.2 05/17/2023 0844   GFRNONAA >60 05/30/2022 0841   GFRNONAA >89 09/23/2016 1442   GFRAA >60 05/09/2020 0524   GFRAA >89 09/23/2016 1442   Lab Results  Component Value Date   CHOL 129 05/07/2022   HDL 23 (L) 05/07/2022   LDLCALC 84 05/07/2022   TRIG 112 05/07/2022   CHOLHDL 5.6 05/07/2022   Lab Results  Component Value Date   HGBA1C 5.4 05/24/2023   Lab Results  Component Value Date   VITAMINB12 660 05/24/2023   Lab Results  Component Value Date   TSH 4.100 10/08/2022    Margie Ege, AGNP-C, DNP 06/03/2023, 5:53 AM [facility] [address] [place], [place] [address] [phone]
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
## 2023-06-04 ENCOUNTER — Ambulatory Visit (HOSPITAL_COMMUNITY): Payer: Medicaid Other | Admitting: Student

## 2023-06-04 ENCOUNTER — Encounter (HOSPITAL_COMMUNITY): Payer: Self-pay | Admitting: Student

## 2023-06-04 DIAGNOSIS — F1121 Opioid dependence, in remission: Secondary | ICD-10-CM | POA: Diagnosis not present

## 2023-06-04 DIAGNOSIS — F1020 Alcohol dependence, uncomplicated: Secondary | ICD-10-CM | POA: Diagnosis not present

## 2023-06-04 DIAGNOSIS — F1420 Cocaine dependence, uncomplicated: Secondary | ICD-10-CM | POA: Diagnosis not present

## 2023-06-04 DIAGNOSIS — F17210 Nicotine dependence, cigarettes, uncomplicated: Secondary | ICD-10-CM

## 2023-06-04 DIAGNOSIS — F411 Generalized anxiety disorder: Secondary | ICD-10-CM

## 2023-06-04 MED ORDER — ARIPIPRAZOLE 2 MG PO TABS
1.0000 mg | ORAL_TABLET | Freq: Every day | ORAL | 1 refills | Status: DC
Start: 2023-06-04 — End: 2023-09-09

## 2023-06-04 MED ORDER — HYDROXYZINE HCL 25 MG PO TABS: 25.0000 mg | ORAL_TABLET | Freq: Three times a day (TID) | ORAL | 1 refills | Status: DC | PRN

## 2023-06-09 ENCOUNTER — Ambulatory Visit (HOSPITAL_COMMUNITY): Payer: Medicaid Other | Admitting: Student

## 2023-06-10 ENCOUNTER — Ambulatory Visit (HOSPITAL_COMMUNITY): Payer: Medicaid Other | Admitting: Student

## 2023-06-14 ENCOUNTER — Ambulatory Visit (HOSPITAL_COMMUNITY): Payer: Medicaid Other | Admitting: Mental Health

## 2023-06-14 ENCOUNTER — Encounter (HOSPITAL_COMMUNITY): Payer: Self-pay

## 2023-06-21 ENCOUNTER — Other Ambulatory Visit: Payer: Self-pay | Admitting: Nurse Practitioner

## 2023-06-28 DIAGNOSIS — Z0271 Encounter for disability determination: Secondary | ICD-10-CM

## 2023-07-02 ENCOUNTER — Encounter (HOSPITAL_COMMUNITY): Payer: Medicaid Other | Admitting: Student

## 2023-07-13 ENCOUNTER — Other Ambulatory Visit: Payer: Self-pay

## 2023-07-13 MED ORDER — PANTOPRAZOLE SODIUM 40 MG PO TBEC: 40.0000 mg | DELAYED_RELEASE_TABLET | Freq: Every day | ORAL | 0 refills | Status: DC

## 2023-07-16 ENCOUNTER — Ambulatory Visit: Payer: Medicaid Other | Attending: Cardiovascular Disease | Admitting: Cardiovascular Disease

## 2023-07-21 ENCOUNTER — Other Ambulatory Visit: Payer: Self-pay | Admitting: Nurse Practitioner

## 2023-07-21 DIAGNOSIS — R609 Edema, unspecified: Secondary | ICD-10-CM

## 2023-08-03 ENCOUNTER — Other Ambulatory Visit: Payer: Self-pay

## 2023-08-03 MED ORDER — CLONIDINE HCL 0.1 MG PO TABS: 0.1000 mg | ORAL_TABLET | Freq: Two times a day (BID) | ORAL | 2 refills | Status: DC

## 2023-08-03 NOTE — Telephone Encounter (Signed)
Please advise KH 

## 2023-08-07 ENCOUNTER — Other Ambulatory Visit: Payer: Self-pay | Admitting: Nurse Practitioner

## 2023-08-13 ENCOUNTER — Telehealth (HOSPITAL_COMMUNITY): Payer: Self-pay | Admitting: *Deleted

## 2023-08-13 NOTE — Telephone Encounter (Signed)
Fax received from [facility] asking for a refill of Abilify 2mg . No appointments scheduled. Was a No Show in November. Last seen October this year.

## 2023-08-14 NOTE — Telephone Encounter (Signed)
Patient needs a scheduled appointment before refills can be made. Will not be refilling at this time.

## 2023-08-17 ENCOUNTER — Telehealth (HOSPITAL_COMMUNITY): Payer: Self-pay | Admitting: *Deleted

## 2023-08-17 NOTE — Telephone Encounter (Signed)
Fax received from [facility] pharmacy for refill of Aripiprazole 2mg . Chart reviewed, patient not seen since 10/11, no future appointments, no show for Nov appointment. Pharmacy notified that patient will need an appointment for future refills.

## 2023-08-30 ENCOUNTER — Ambulatory Visit: Payer: Self-pay | Admitting: Nurse Practitioner

## 2023-09-02 ENCOUNTER — Ambulatory Visit: Payer: Self-pay | Admitting: Nurse Practitioner

## 2023-09-03 ENCOUNTER — Other Ambulatory Visit: Payer: Self-pay | Admitting: Nurse Practitioner

## 2023-09-03 DIAGNOSIS — I10 Essential (primary) hypertension: Secondary | ICD-10-CM

## 2023-09-03 DIAGNOSIS — F411 Generalized anxiety disorder: Secondary | ICD-10-CM

## 2023-09-03 DIAGNOSIS — E559 Vitamin D deficiency, unspecified: Secondary | ICD-10-CM

## 2023-09-03 DIAGNOSIS — F419 Anxiety disorder, unspecified: Secondary | ICD-10-CM

## 2023-09-03 NOTE — Telephone Encounter (Signed)
 Copied from CRM [id number]. Topic: Clinical - Medication Refill >> Sep 03, 2023  4:21 PM Elle L wrote: Most Recent Primary Care Visit:  Provider: OLEY BASCOM RAMAN  Department: [facility]  Visit Type: OFFICE VISIT  Date: 05/24/2023  Medication: Vitamin D , Ergocalciferol , (DRISDOL ) 1.25 MG (50000 UNIT) CAPS capsule, pantoprazole  (PROTONIX ) 40 MG tablet , ondansetron  (ZOFRAN ) 8 MG tablet, hydrOXYzine  (ATARAX ) 25 MG tablet, hydrochlorothiazide  (HYDRODIURIL ) 25 MG tablet, escitalopram  (LEXAPRO ) 20 MG tablet, clopidogrel  (PLAVIX ) 75 MG tablet, cloNIDine  (CATAPRES ) 0.1 MG tablet, cetirizine  (ZYRTEC ) 10 MG tablet, atorvastatin  (LIPITOR) 40 MG tablet,   Has the patient contacted their pharmacy?  (Agent: If no, request that the patient contact the pharmacy for the refill. If patient does not wish to contact the pharmacy document the reason why and proceed with request.) (Agent: If yes, when and what did the pharmacy advise?)  Is this the correct pharmacy for this prescription?  If no, delete pharmacy and type the correct one.  This is the patient's preferred pharmacy:  [id number] [place], [place] - [phone] AT [facility] & [address] [place] [address] Phone: [phone] Fax: [phone]   Has the prescription been filled recently?   Is the patient out of the medication?   Has the patient been seen for an appointment in the last year OR does the patient have an upcoming appointment?   Can we respond through MyChart?   Agent: Please be advised that Rx refills may take up to 3 business days. We ask that you follow-up with your pharmacy.

## 2023-09-06 MED ORDER — HYDROCHLOROTHIAZIDE 25 MG PO TABS: 25.0000 mg | ORAL_TABLET | Freq: Every day | ORAL | 3 refills | Status: DC

## 2023-09-06 MED ORDER — CETIRIZINE HCL 10 MG PO TABS: 10.0000 mg | ORAL_TABLET | Freq: Every day | ORAL | 11 refills | Status: DC

## 2023-09-06 MED ORDER — VITAMIN D (ERGOCALCIFEROL) 1.25 MG (50000 UNIT) PO CAPS: 50000.0000 [IU] | ORAL_CAPSULE | ORAL | 3 refills | Status: DC

## 2023-09-06 MED ORDER — PANTOPRAZOLE SODIUM 40 MG PO TBEC: 40.0000 mg | DELAYED_RELEASE_TABLET | Freq: Every day | ORAL | 0 refills | Status: DC

## 2023-09-06 MED ORDER — ONDANSETRON HCL 8 MG PO TABS: 8.0000 mg | ORAL_TABLET | Freq: Three times a day (TID) | ORAL | 2 refills | Status: DC | PRN

## 2023-09-06 MED ORDER — CLONIDINE HCL 0.1 MG PO TABS: 0.1000 mg | ORAL_TABLET | Freq: Two times a day (BID) | ORAL | 2 refills | Status: DC

## 2023-09-06 MED ORDER — ESCITALOPRAM OXALATE 20 MG PO TABS: 20.0000 mg | ORAL_TABLET | Freq: Every day | ORAL | 2 refills | Status: DC

## 2023-09-06 MED ORDER — HYDROXYZINE HCL 25 MG PO TABS: 25.0000 mg | ORAL_TABLET | Freq: Three times a day (TID) | ORAL | 1 refills | Status: DC | PRN

## 2023-09-06 MED ORDER — CLOPIDOGREL BISULFATE 75 MG PO TABS: 75.0000 mg | ORAL_TABLET | Freq: Every day | ORAL | 2 refills | Status: DC

## 2023-09-06 MED ORDER — ATORVASTATIN CALCIUM 40 MG PO TABS: ORAL_TABLET | ORAL | 1 refills | Status: DC

## 2023-09-06 NOTE — Progress Notes (Signed)
 Patient: [name] Date of Birth: [birth year]  Reason for Visit: Follow up History from: Patient Primary Neurologist: Rosemarie  ASSESSMENT AND PLAN 38 y.o. 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  Vascular risk factors of hypertension, hyperlipidemia, polysubstance abuse, cocaine, alcoholism and smoking . Dong overall well today, but having anxiety, stressors.  -Continue Plavix  75 mg daily for secondary stroke prevention -Referral to PT, OT, ST.  Will ask speech therapy to work on cognitive training.  Has right-sided weakness post right knee surgery, left-sided weakness post CVA. -Recommend close follow-up with primary care provider, needs to get back on daily medications, looks like all were refilled yesterday -Keep close follow-up with psychiatry for mood management -Have recommended against smoking, drug use, alcohol  use -Follow-up as needed our office  HISTORY OF PRESENT ILLNESS: Today 09/07/23 Saw Dr. Rosemarie is Jan 2024. Recommended continue Plavix  75 mg daily. Referral to PT/OT. Check arterial Doppler bubble study for PFO (was negative). Doesn't drive. Denies any drug use, smokes 1-2 packs a week, 2-3 40 oz beers a week. Remains on Plavix  75 mg daily. Didn't do any PT/OT that was ordered. Transportation is difficult, she lives with her mom. Is trying to get disability. Post CVA has difficulty concentrating and focusing, has chronic left arm and leg weakness from CVA, right sided leg weakness from knee surgery. Is out of most medications, PCP refilled medications yesterday. Is on Suboxone. Has a lot of stressors, anxiety, is crying today.   HISTORY  09/14/22 Dr. Rosemarie HPI: [name] is a 38 year old Caucasian lady with past medical history of hypertension, hyperlipidemia, anxiety, cirrhosis, polysubstance abuse, cocaine abuse, smoking who is  seen today for initial office consultation visit.  She is accompanied by her mother.  History is obtained from her and review of electronic medical records.  I have personally reviewed pertinent available imaging films in PACS.  She initially presented on 03/21/2020 1 to 2-week history of numbness in the left hand in the right hand.  MRI showed multiple bilateral thalamic,left corona radiata/internal capsule lacunar infarcts.  Subacute.  He was hypertensive reported abusing cocaine off and on.  She left AGAINST MEDICAL ADVICE prior to completing stroke workup.  She returned on 05/06/2022.  Patient was admitted with abdominal pain suspected abdominal abscess and was on antibiotics awaiting surgery He was found to have right-sided weakness.  MRI scan of the spine showed right pontine, left putamen, right thalamus, corpus callosum and left frontal white matter infarcts.  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  Echocardiogram showed ejection fraction of 60 to 65% without clot.  Hemoglobin A1c was 5.4 and LDL cholesterol was 84 mg percent.  RPR and HIV were negative.  He had known history of noncompliance with medications and follow-up.  She was started on aspirin  and Plavix  and statin for lipid.  States is done well since abnormal recurrent stroke or TIA symptoms.  She does feel that her walking is not back to baseline.  Also feels right side is weak and sometimes the right leg is tricky and at times gives away for no reason.  She does walk independently without a cane.  She does not have health insurance she did not seek medical follow-up but now states has since gotten health insurance.  REVIEW OF SYSTEMS: Out of a complete 14 system review of symptoms, the patient complains only of the following symptoms, and all other reviewed  systems are negative.  See HPI  ALLERGIES: Allergies  Allergen Reactions   Ibuprofen  Anaphylaxis, Swelling and Other (See Comments)    Patient thinks it may have caused her  throat to swell  (** PATIENT HAS BEEN TOLERATING THIS IN 2023**)   Latex Shortness Of Breath   Peanut-Containing Drug Products Anaphylaxis   Penicillins Anaphylaxis and Hives    Has patient had a PCN reaction causing immediate rash, facial/tongue/throat swelling, SOB or lightheadedness with hypotension: Yes Has patient had a PCN reaction causing severe rash involving mucus membranes or skin necrosis: unknown Has patient had a PCN reaction that required hospitalization No Has patient had a PCN reaction occurring within the last 10 years: No If all of the above answers are NO, then may proceed with Cephalosporin use. Tolerated ceftriaxone  2018 & 2019    Acetaminophen  Other (See Comments)    Causes patient to have an upset stomach- Tolerating this in 2023 (325 mg tablets)   Codeine Hives and Itching   Lisinopril  Swelling   Ativan  [Lorazepam ] Other (See Comments)    Hallucinations, per patient    HOME MEDICATIONS: Outpatient Medications Prior to Visit  Medication Sig Dispense Refill   atorvastatin  (LIPITOR) 40 MG tablet TAKE 1 TABLET(40 MG) BY MOUTH DAILY 90 tablet 1   Blood Pressure KIT 1 Application by Does not apply route daily. 1 kit 0   buprenorphine-naloxone  (SUBOXONE) 8-2 mg SUBL SL tablet Place 1 tablet under the tongue 3 (three) times daily.     cloNIDine  (CATAPRES ) 0.1 MG tablet Take 1 tablet (0.1 mg total) by mouth 2 (two) times daily. 60 tablet 2   clopidogrel  (PLAVIX ) 75 MG tablet Take 1 tablet (75 mg total) by mouth daily. 30 tablet 2   escitalopram  (LEXAPRO ) 20 MG tablet Take 1 tablet (20 mg total) by mouth daily. 30 tablet 2   hydrochlorothiazide  (HYDRODIURIL ) 25 MG tablet Take 1 tablet (25 mg total) by mouth daily. 90 tablet 3   ondansetron  (ZOFRAN ) 8 MG tablet Take 1 tablet (8 mg total) by mouth every 8 (eight) hours as needed for nausea or vomiting. 20 tablet 2   pantoprazole  (PROTONIX ) 40 MG tablet Take 1 tablet (40 mg total) by mouth daily. 90 tablet 0    ARIPiprazole  (ABILIFY ) 2 MG tablet Take 0.5 tablets (1 mg total) by mouth daily. 15 tablet 1   cetirizine  (ZYRTEC ) 10 MG tablet Take 1 tablet (10 mg total) by mouth daily. (Patient not taking: Reported on 09/07/2023) 30 tablet 11   hydrOXYzine  (ATARAX ) 25 MG tablet Take 1 tablet (25 mg total) by mouth 3 (three) times daily as needed. (Patient not taking: Reported on 09/07/2023) 90 tablet 1   Vitamin D , Ergocalciferol , (DRISDOL ) 1.25 MG (50000 UNIT) CAPS capsule Take 1 capsule (50,000 Units total) by mouth every 7 (seven) days. (Patient not taking: Reported on 09/07/2023) 5 capsule 3   No facility-administered medications prior to visit.    PAST MEDICAL HISTORY: Past Medical History:  Diagnosis Date   Anxiety    Cirrhosis of liver (HCC)    Cocaine use 08/04/2018   History of cocaine use 08/04/2018   Hypertension    Polysubstance abuse (HCC)    Renal disorder    Kidney Infection    Stroke [facility])    x12    PAST SURGICAL HISTORY: Past Surgical History:  Procedure Laterality Date   ARTERY REPAIR     CESAREAN SECTION     x 5   MOUTH SURGERY     TEAR DUCT  PROBING     TUBAL LIGATION     VENTRAL HERNIA REPAIR N/A 10/03/2016   Procedure: OPEN INCARCERATED HERNIA REPAIR VENTRAL ADULT;  Surgeon: Lynda Leos, MD;  Location: [facility] OR;  Service: General;  Laterality: N/A;    FAMILY HISTORY: Family History  Problem Relation Age of Onset   Aortic stenosis Mother    Stroke Father    Hyperlipidemia Father    Hypertension Father    Alpha-1 antitrypsin deficiency Brother    Heart disease Brother    CAD Other    Diabetes Other    Hypertension Other    Cancer Other    Thyroid  disease Other     SOCIAL HISTORY: Social History   Socioeconomic History   Marital status: Single    Spouse name: Not on file   Number of children: Not on file   Years of education: Not on file   Highest education level: 9th grade  Occupational History   Not on file  Tobacco Use   Smoking status: Every Day     Current packs/day: 0.50    Types: Cigarettes   Smokeless tobacco: Never  Vaping Use   Vaping status: Never Used  Substance and Sexual Activity   Alcohol  use: Yes    Comment: daily   Drug use: Not Currently    Types: Cocaine   Sexual activity: Not on file  Other Topics Concern   Not on file  Social History Narrative   Not on file   Social Drivers of Health   Financial Resource Strain: High Risk (04/15/2023)   Overall Financial Resource Strain (CARDIA)    Difficulty of Paying Living Expenses: Very hard  Food Insecurity: Food Insecurity Present (04/15/2023)   Hunger Vital Sign    Worried About Running Out of Food in the Last Year: Often true    Ran Out of Food in the Last Year: Often true  Transportation Needs: Unmet Transportation Needs (04/15/2023)   PRAPARE - Administrator, Civil Service (Medical): Yes    Lack of Transportation (Non-Medical): Yes  Physical Activity: Not on file  Stress: Not on file  Social Connections: Unknown (04/15/2023)   Social Connection and Isolation Panel [NHANES]    Frequency of Communication with Friends and Family: More than three times a week    Frequency of Social Gatherings with Friends and Family: Three times a week    Attends Religious Services: Patient declined    Active Member of Clubs or Organizations: No    Attends Banker Meetings: Not on file    Marital Status: Never married  Intimate Partner Violence: Not At Risk (11/13/2022)   Humiliation, Afraid, Rape, and Kick questionnaire    Fear of Current or Ex-Partner: No    Emotionally Abused: No    Physically Abused: No    Sexually Abused: No   PHYSICAL EXAM  Vitals:   09/07/23 0920  BP: (!) 146/92  Pulse: 84  Weight: 177 lb (80.3 kg)  Height: 5' (1.524 m)   Body mass index is 34.57 kg/m.  Generalized: Well developed, in no acute distress  Neurological examination  Mentation: Alert oriented to time, place, history taking. Follows all commands speech  and language fluent Cranial nerve II-XII: Pupils were equal round reactive to light. Extraocular movements were full, visual field were full on confrontational test. Facial sensation and strength were normal.  Head turning and shoulder shrug  were normal and symmetric. Motor: The motor testing reveals 5 over 5 strength of all  4 extremities. Good symmetric motor tone is noted throughout.  Sensory: Sensory testing is intact to soft touch on all 4 extremities. No evidence of extinction is noted.  Coordination: Cerebellar testing reveals good finger-nose-finger and heel-to-shin bilaterally.  Gait and station: Gait is wide-based, limp on the right.  No assistive device Reflexes: Deep tendon reflexes are symmetric and normal bilaterally.   DIAGNOSTIC DATA (LABS, IMAGING, TESTING) - I reviewed patient records, labs, notes, testing and imaging myself where available.  Lab Results  Component Value Date   WBC 7.0 05/24/2023   HGB 13.2 05/24/2023   HCT 41.8 05/24/2023   MCV 94 05/24/2023   PLT 256 05/24/2023      Component Value Date/Time   NA 139 05/24/2023 0854   K 4.4 05/24/2023 0854   CL 100 05/24/2023 0854   CO2 22 05/24/2023 0854   GLUCOSE 111 (H) 05/24/2023 0854   GLUCOSE 103 (H) 05/30/2022 0841   BUN 15 05/24/2023 0854   CREATININE 0.69 05/24/2023 0854   CREATININE 0.69 09/23/2016 1442   CALCIUM  9.4 05/24/2023 0854   PROT 6.1 05/17/2023 0844   ALBUMIN 4.0 05/17/2023 0844   AST 21 05/17/2023 0844   ALT 25 05/17/2023 0844   ALKPHOS 111 05/17/2023 0844   BILITOT <0.2 05/17/2023 0844   GFRNONAA >60 05/30/2022 0841   GFRNONAA >89 09/23/2016 1442   GFRAA >60 05/09/2020 0524   GFRAA >89 09/23/2016 1442   Lab Results  Component Value Date   CHOL 129 05/07/2022   HDL 23 (L) 05/07/2022   LDLCALC 84 05/07/2022   TRIG 112 05/07/2022   CHOLHDL 5.6 05/07/2022   Lab Results  Component Value Date   HGBA1C 5.4 05/24/2023   Lab Results  Component Value Date   VITAMINB12 660  05/24/2023   Lab Results  Component Value Date   TSH 4.100 10/08/2022    Lauraine Born, AGNP-C, DNP 09/07/2023, 9:33 AM [facility] [address], [address] [place], [place] [address] [phone]

## 2023-09-07 ENCOUNTER — Encounter: Payer: Self-pay | Admitting: Neurology

## 2023-09-07 ENCOUNTER — Ambulatory Visit (INDEPENDENT_AMBULATORY_CARE_PROVIDER_SITE_OTHER): Payer: Medicaid Other | Admitting: Neurology

## 2023-09-07 VITALS — BP 142/90 | HR 84 | Ht 60.0 in | Wt 177.0 lb

## 2023-09-07 DIAGNOSIS — I6381 Other cerebral infarction due to occlusion or stenosis of small artery: Secondary | ICD-10-CM

## 2023-09-07 DIAGNOSIS — I639 Cerebral infarction, unspecified: Secondary | ICD-10-CM

## 2023-09-07 DIAGNOSIS — Z720 Tobacco use: Secondary | ICD-10-CM

## 2023-09-07 DIAGNOSIS — I10 Essential (primary) hypertension: Secondary | ICD-10-CM | POA: Diagnosis not present

## 2023-09-07 DIAGNOSIS — F411 Generalized anxiety disorder: Secondary | ICD-10-CM

## 2023-09-07 DIAGNOSIS — F1410 Cocaine abuse, uncomplicated: Secondary | ICD-10-CM

## 2023-09-07 NOTE — Patient Instructions (Signed)
 Continue the Plavix  for secondary stroke prevention  Strict management of vascular risk factors with a goal BP less than 130/90, A1c less than 7.0, LDL less than 70 for secondary stroke prevention  Recommend smoking, alcohol  and drug cessation  Referral to physical, speech, occupational therapy Continue follow up with primary care and psychiatry  Return here as needed

## 2023-09-08 NOTE — Progress Notes (Signed)
Psychiatric Follow Up Adult Assessment  Patient Identification: [name] MRN:  [id number] Date of Evaluation:  09/08/2023  Assessment:  [name] is a 38 y.o. female with a history of anxiety, depression, opiate use disorder on suboxone, stimulant use disorder-cocaine, alcohol dependence, CVA, nicotine dependence, hypertension, hyperlipidemia, vitamin D deficiency who presents in person to [facility] for medication management.  While she did not notice significant improvement with the Abilify, she states that her mom reported that she had been less labile and irritable.  She had initially felt some daytime sedation after increase in hydroxyzine and starting Abilify, she reports this improved after some time. She reports being unable to fill the abilify after the 1st month which appears to have been due to not scheduling an appointment and missing her last one. Plan to discuss CDIOP and substance use counseling next visit.   Plan:  # Substance induced mood/anxiety disorder # Cocaine Use Disorder, in early remission # Alcohol Use Disorder, in early remission # r/o GAD w/ panic  # r/o PTSD Past medication trials:  Status of problem: active Interventions: Last cocaine October 2024 -1-2 40 oz every 2-3 days -- advise cessation -- consider naltrexone if difficulty with cessation -- continue lexapro 20 mg -- abilify 2 mg for 8 days then INCREASE to 4 mg daily as an adjunct -- continue hydroxyzine 25 mg tid prn  # Opiate use disorder Past medication trials:  Status of problem: active Interventions: -- on suboxone 8-2 tid via [facility]Stop  #Nicotine dependence -precontemplative, advise cessation  Return to care in 1 month  Patient was given contact information for behavioral health clinic and was instructed to call 911 for emergencies.    Patient and plan of care will be discussed with the Attending MD, Dr. Josephina Shih, who agrees with the above  statement and plan.   Subjective:  Chief Complaint: Medication Management  Interval History: Patient presents for follow-up for medication management.  She reports overall doing well.  She does report some worsening irritability after running out of her Abilify.  She reports when she had started Abilify last appointment, she did not notice significant difference but her mother reports she was significant less irritable and labile.  She denies present SI/HI/AVH.  She reported some daytime grogginess when she started the Abilify but reports it subsided after approximately 1 week.  She also reported that this could have been secondary to the increase in hydroxyzine.  She has been taking hydroxyzine as needed and reports mild sedation but overall it does help with her anxiety.  She denies any recent substance use with the exception of alcohol (40 oz of beer every 2-3 days). She denies any craving for substances. She would like to be restarted on the Abilify and increased from 2 mg as she reports having some residual irritability when she was on the 2 mg of abilify. She overall reports appropriate sleep and appetite.  She continues to live with mom and is still working to get disability.  Past Psychiatric History:  Diagnoses: MDD, GAD, PTSD Medication trials: lexapro, celexa, clonazepam Previous psychiatrist/therapist: when teenager had a psychiatrist, never had a therapist Hospitalizations: denies Suicide attempts: denies SIB: denies Hx of violence towards others: denies Current access to guns: denies Hx of trauma/abuse: endorses  Substance Abuse History in the last 12 months:  Yes.    Past Medical History:  Past Medical History:  Diagnosis Date   Anxiety    Cirrhosis of liver (HCC)  Cocaine use 08/04/2018   History of cocaine use 08/04/2018   Hypertension    Polysubstance abuse (HCC)    Renal disorder    Kidney Infection    Stroke (HCC)    x12    Past Surgical History:   Procedure Laterality Date   ARTERY REPAIR     CESAREAN SECTION     x 5   MOUTH SURGERY     TEAR DUCT PROBING     TUBAL LIGATION     VENTRAL HERNIA REPAIR N/A 10/03/2016   Procedure: OPEN INCARCERATED HERNIA REPAIR VENTRAL ADULT;  Surgeon: Axel Filler, MD;  Location: [facility] OR;  Service: General;  Laterality: N/A;      Family History:  Family History  Problem Relation Age of Onset   Aortic stenosis Mother    Stroke Father    Hyperlipidemia Father    Hypertension Father    Alpha-1 antitrypsin deficiency Brother    Heart disease Brother    CAD Other    Diabetes Other    Hypertension Other    Cancer Other    Thyroid disease Other     Social History:   Academic/Vocational: Unemployed Social History   Socioeconomic History   Marital status: Single    Spouse name: Not on file   Number of children: Not on file   Years of education: Not on file   Highest education level: 9th grade  Occupational History   Not on file  Tobacco Use   Smoking status: Every Day    Current packs/day: 0.50    Types: Cigarettes   Smokeless tobacco: Never  Vaping Use   Vaping status: Never Used  Substance and Sexual Activity   Alcohol use: Yes    Comment: daily   Drug use: Not Currently    Types: Cocaine   Sexual activity: Not on file  Other Topics Concern   Not on file  Social History Narrative   Not on file   Social Drivers of Health   Financial Resource Strain: High Risk (04/15/2023)   Overall Financial Resource Strain (CARDIA)    Difficulty of Paying Living Expenses: Very hard  Food Insecurity: Food Insecurity Present (04/15/2023)   Hunger Vital Sign    Worried About Running Out of Food in the Last Year: Often true    Ran Out of Food in the Last Year: Often true  Transportation Needs: Unmet Transportation Needs (04/15/2023)   PRAPARE - Administrator, Civil Service (Medical): Yes    Lack of Transportation (Non-Medical): Yes  Physical Activity: Not on file  Stress:  Not on file  Social Connections: Unknown (04/15/2023)   Social Connection and Isolation Panel [NHANES]    Frequency of Communication with Friends and Family: More than three times a week    Frequency of Social Gatherings with Friends and Family: Three times a week    Attends Religious Services: Patient declined    Active Member of Clubs or Organizations: No    Attends Engineer, structural: Not on file    Marital Status: Never married    Additional Social History: updated  Allergies:   Allergies  Allergen Reactions   Ibuprofen Anaphylaxis, Swelling and Other (See Comments)    Patient thinks it may have caused her throat to "swell"  (** PATIENT HAS BEEN TOLERATING THIS IN 2023**)   Latex Shortness Of Breath   Peanut-Containing Drug Products Anaphylaxis   Penicillins Anaphylaxis and Hives    Has patient had a PCN reaction causing  immediate rash, facial/tongue/throat swelling, SOB or lightheadedness with hypotension: Yes Has patient had a PCN reaction causing severe rash involving mucus membranes or skin necrosis: unknown Has patient had a PCN reaction that required hospitalization No Has patient had a PCN reaction occurring within the last 10 years: No If all of the above answers are "NO", then may proceed with Cephalosporin use. Tolerated ceftriaxone 2018 & 2019    Acetaminophen Other (See Comments)    Causes patient to have an upset stomach- Tolerating this in 2023 (325 mg tablets)   Codeine Hives and Itching   Lisinopril Swelling   Ativan [Lorazepam] Other (See Comments)    "Hallucinations," per patient    Current Medications: Current Outpatient Medications  Medication Sig Dispense Refill   ARIPiprazole (ABILIFY) 2 MG tablet Take 0.5 tablets (1 mg total) by mouth daily. 15 tablet 1   atorvastatin (LIPITOR) 40 MG tablet TAKE 1 TABLET(40 MG) BY MOUTH DAILY 90 tablet 1   Blood Pressure KIT 1 Application by Does not apply route daily. 1 kit 0   buprenorphine-naloxone  (SUBOXONE) 8-2 mg SUBL SL tablet Place 1 tablet under the tongue 3 (three) times daily.     cetirizine (ZYRTEC) 10 MG tablet Take 1 tablet (10 mg total) by mouth daily. (Patient not taking: Reported on 09/07/2023) 30 tablet 11   cloNIDine (CATAPRES) 0.1 MG tablet Take 1 tablet (0.1 mg total) by mouth 2 (two) times daily. 60 tablet 2   clopidogrel (PLAVIX) 75 MG tablet Take 1 tablet (75 mg total) by mouth daily. 30 tablet 2   escitalopram (LEXAPRO) 20 MG tablet Take 1 tablet (20 mg total) by mouth daily. 30 tablet 2   hydrochlorothiazide (HYDRODIURIL) 25 MG tablet Take 1 tablet (25 mg total) by mouth daily. 90 tablet 3   hydrOXYzine (ATARAX) 25 MG tablet Take 1 tablet (25 mg total) by mouth 3 (three) times daily as needed. (Patient not taking: Reported on 09/07/2023) 90 tablet 1   ondansetron (ZOFRAN) 8 MG tablet Take 1 tablet (8 mg total) by mouth every 8 (eight) hours as needed for nausea or vomiting. 20 tablet 2   pantoprazole (PROTONIX) 40 MG tablet Take 1 tablet (40 mg total) by mouth daily. 90 tablet 0   Vitamin D, Ergocalciferol, (DRISDOL) 1.25 MG (50000 UNIT) CAPS capsule Take 1 capsule (50,000 Units total) by mouth every 7 (seven) days. (Patient not taking: Reported on 09/07/2023) 5 capsule 3   No current facility-administered medications for this visit.    ROS: Review of Systems   Objective:  Psychiatric Specialty Exam: There were no vitals taken for this visit.There is no height or weight on file to calculate BMI.  General Appearance: Fairly Groomed  Eye Contact:  Fair  Speech:  Clear and Coherent and Normal Rate  Volume:  Normal  Mood:  Anxious  Affect:  Congruent and Constricted  Thought Content: Logical   Suicidal Thoughts:  No  Homicidal Thoughts:  No  Thought Process: Primarily coherent but occasionally would become disorganized  Orientation:  Full (Time, Place, and Person)    Memory: Recent;   Fair  Judgment:  Impaired  Insight:  Shallow  Concentration:   Concentration: Poor  Recall:  not formally assessed   Fund of Knowledge: Fair  Language: Fair  Psychomotor Activity:  Normal  Akathisia:  No  AIMS (if indicated): not done  Assets:  Communication Skills Desire for Improvement Financial Resources/Insurance Leisure Time Physical Health Resilience Social Support Talents/Skills Transportation  ADL's:  Intact  Cognition:  WNL  Sleep:  Fair   PE: General: well-appearing; no acute distress  Pulm: no increased work of breathing on room air  Strength & Muscle Tone: within normal limits Neuro: no focal neurological deficits observed  Gait & Station: unsteady  Metabolic Disorder Labs: Lab Results  Component Value Date   HGBA1C 5.4 05/24/2023   MPG 108.28 05/07/2022   MPG 114.02 03/21/2020   No results found for: "PROLACTIN" Lab Results  Component Value Date   CHOL 129 05/07/2022   TRIG 112 05/07/2022   HDL 23 (L) 05/07/2022   CHOLHDL 5.6 05/07/2022   VLDL 22 05/07/2022   LDLCALC 84 05/07/2022   LDLCALC 81 03/21/2020   Lab Results  Component Value Date   TSH 4.100 10/08/2022    Therapeutic Level Labs: No results found for: "LITHIUM" No results found for: "CBMZ" No results found for: "VALPROATE"  Screenings:  AUDIT    Flowsheet Row Office Visit from 04/15/2023 in [facility] - [facility] Dept Of [place] [facility]  Alcohol Use Disorder Identification Test Final Score (AUDIT) 13       GAD-7    Flowsheet Row Office Visit from 04/15/2023 in [facility] - A Dept Of Eligha Bridegroom [facility] Office Visit from 10/08/2022 in [facility] - [facility] Dept Of Eligha Bridegroom [facility] Office Visit from 09/23/2016 in [facility] [place] - A Dept Of [place]  Total GAD-7 Score 15 12 11       PHQ2-9    Flowsheet Row Office Visit from 04/15/2023 in [facility] - A Dept Of Eligha Bridegroom [facility] Office Visit from  11/13/2022 in [facility] - A Dept Of Eligha Bridegroom [facility] Office Visit from 10/08/2022 in [facility] - A Dept Of Eligha Bridegroom [facility] Office Visit from 09/07/2022 in [facility] - A Dept Of Eligha Bridegroom [facility] Office Visit from 09/23/2016 in [facility] [place] - A Dept Of . [facility]  PHQ-2 Total Score 4 0 6 1 3   PHQ-9 Total Score 17 3 10  -- 16      Flowsheet Row ED from 01/25/2023 in [facility] at [facility] ED to Hosp-Admission (Discharged) from 05/28/2022 in [facility] ED to Hosp-Admission (Discharged) from 05/12/2022 in [facility]  C-SSRS RISK CATEGORY No Risk No Risk No Risk       Collaboration of Care: Collaboration of Care:   Patient/Guardian was advised Release of Information must be obtained prior to any record release in order to collaborate their care with an outside provider. Patient/Guardian was advised if they have not already done so to contact the registration department to sign all necessary forms in order for [place] to release information regarding their care.   Consent: Patient/Guardian gives verbal consent for treatment and assignment of benefits for services provided during this visit. Patient/Guardian expressed understanding and agreed to proceed.   Park Pope, MD 1/15/202510:28 AM

## 2023-09-09 ENCOUNTER — Ambulatory Visit (INDEPENDENT_AMBULATORY_CARE_PROVIDER_SITE_OTHER): Payer: Medicaid Other | Admitting: Student

## 2023-09-09 ENCOUNTER — Encounter (HOSPITAL_COMMUNITY): Payer: Self-pay

## 2023-09-09 DIAGNOSIS — F419 Anxiety disorder, unspecified: Secondary | ICD-10-CM

## 2023-09-09 DIAGNOSIS — F32A Depression, unspecified: Secondary | ICD-10-CM | POA: Diagnosis not present

## 2023-09-09 DIAGNOSIS — F411 Generalized anxiety disorder: Secondary | ICD-10-CM

## 2023-09-09 MED ORDER — HYDROXYZINE HCL 25 MG PO TABS: 25.0000 mg | ORAL_TABLET | Freq: Three times a day (TID) | ORAL | 1 refills | Status: DC | PRN

## 2023-09-09 MED ORDER — ESCITALOPRAM OXALATE 20 MG PO TABS: 20.0000 mg | ORAL_TABLET | Freq: Every day | ORAL | 0 refills | Status: DC

## 2023-09-09 MED ORDER — ARIPIPRAZOLE 2 MG PO TABS: ORAL_TABLET | ORAL | 0 refills | Status: AC

## 2023-09-16 ENCOUNTER — Ambulatory Visit: Payer: Medicaid Other | Admitting: Speech Pathology

## 2023-09-16 ENCOUNTER — Ambulatory Visit: Payer: Medicaid Other | Attending: Neurology

## 2023-09-16 ENCOUNTER — Ambulatory Visit: Payer: Medicaid Other | Admitting: Occupational Therapy

## 2023-09-16 VITALS — BP 121/77 | HR 76

## 2023-09-16 DIAGNOSIS — R278 Other lack of coordination: Secondary | ICD-10-CM | POA: Diagnosis present

## 2023-09-16 DIAGNOSIS — R41844 Frontal lobe and executive function deficit: Secondary | ICD-10-CM | POA: Insufficient documentation

## 2023-09-16 DIAGNOSIS — R2681 Unsteadiness on feet: Secondary | ICD-10-CM | POA: Diagnosis present

## 2023-09-16 DIAGNOSIS — R29818 Other symptoms and signs involving the nervous system: Secondary | ICD-10-CM | POA: Insufficient documentation

## 2023-09-16 DIAGNOSIS — I6381 Other cerebral infarction due to occlusion or stenosis of small artery: Secondary | ICD-10-CM | POA: Insufficient documentation

## 2023-09-16 DIAGNOSIS — M6281 Muscle weakness (generalized): Secondary | ICD-10-CM | POA: Insufficient documentation

## 2023-09-16 DIAGNOSIS — R208 Other disturbances of skin sensation: Secondary | ICD-10-CM | POA: Diagnosis present

## 2023-09-16 DIAGNOSIS — R41841 Cognitive communication deficit: Secondary | ICD-10-CM

## 2023-09-16 DIAGNOSIS — R41842 Visuospatial deficit: Secondary | ICD-10-CM

## 2023-09-16 DIAGNOSIS — R293 Abnormal posture: Secondary | ICD-10-CM | POA: Diagnosis present

## 2023-09-16 NOTE — Therapy (Signed)
OUTPATIENT SPEECH LANGUAGE PATHOLOGY EVALUATION   Patient Name: [name] MRN: [id number] DOB:[birth year], 38 y.o., female Today's Date: 09/16/2023  PCP: Ivonne Andrew, NP REFERRING PROVIDER: Glean Salvo, NP  END OF SESSION:  End of Session - 09/16/23 1409     Visit Number 1    Number of Visits 9    Date for SLP Re-Evaluation 11/11/23    Authorization Type Medicaid    SLP Start Time 1315    SLP Stop Time  1410    SLP Time Calculation (min) 55 min    Activity Tolerance Patient tolerated treatment well             Past Medical History:  Diagnosis Date   Anxiety    Cirrhosis of liver (HCC)    Cocaine use 08/04/2018   History of cocaine use 08/04/2018   Hypertension    Polysubstance abuse (HCC)    Renal disorder    Kidney Infection    Stroke (HCC)    x12   Past Surgical History:  Procedure Laterality Date   ARTERY REPAIR     CESAREAN SECTION     x 5   MOUTH SURGERY     TEAR DUCT PROBING     TUBAL LIGATION     VENTRAL HERNIA REPAIR N/A 10/03/2016   Procedure: OPEN INCARCERATED HERNIA REPAIR VENTRAL ADULT;  Surgeon: Axel Filler, MD;  Location: [facility] OR;  Service: General;  Laterality: N/A;   Patient Active Problem List   Diagnosis Date Noted   Cocaine use disorder, moderate, dependence (HCC) 06/04/2023   Opioid dependence in remission (HCC) 06/04/2023   Vitamin D deficiency 11/13/2022   GAD (generalized anxiety disorder) 10/09/2022   Intractable pain 05/30/2022   Hyperlipidemia 05/29/2022   PID (acute pelvic inflammatory disease) 05/12/2022   Tobacco abuse 05/12/2022   History of CVA (cerebrovascular accident) 05/12/2022   Pelvic abscess in female    Alcohol use disorder, severe, dependence (HCC) 05/02/2022   Hypokalemia 05/02/2022   Hepatic steatosis 05/01/2022   Abdominal pain 05/01/2022   Nausea and vomiting 05/01/2022   Cocaine abuse (HCC) 05/01/2022   Acute CVA (cerebrovascular accident) (HCC) 03/21/2020   Hypertensive urgency  03/21/2020   Bronchitis 03/21/2020   Asymptomatic bacteriuria 03/21/2020   Acute respiratory failure with hypoxia (HCC) 08/01/2017   Substance abuse (HCC) 08/01/2017   Constipation 08/01/2017   Anemia 08/01/2017   Rectal bleeding 08/01/2017   Alcohol withdrawal (HCC) 07/31/2017   Ventral hernia 10/04/2016   Incarcerated ventral hernia 10/03/2016   Anxiety 02/15/2014   Hypertension 02/15/2014    ONSET DATE: 09/07/2023 referral  REFERRING DIAG: I63.81 (ICD-10-CM) - Multiple lacunar infarcts (HCC)   THERAPY DIAG:  Cognitive communication deficit  Rationale for Evaluation and Treatment: Rehabilitation  SUBJECTIVE:   SUBJECTIVE STATEMENT: "I know what I want to say but I cannot say it" Pt reporting challenges in verbal expression stating "I have it in here [points to head] but can't get it here [points to mouth]. Additional complaints re: attention and memory.  Pt accompanied by: self  PERTINENT HISTORY: 38 y.o. 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  Vascular risk factors of hypertension, hyperlipidemia, polysubstance abuse, cocaine, alcoholism and smoking .   PAIN:  Are you having pain? See PT eval.  FALLS: Has patient fallen in last 6 months?  See PT evaluation for details  LIVING ENVIRONMENT: Lives with: lives with their family Lives  in: House/apartment  PLOF:  Level of assistance: Independent  Employment: On disability  PATIENT GOALS: "get back to normal"   OBJECTIVE:  Note: Objective measures were completed at Evaluation unless otherwise noted.  DIAGNOSTIC FINDINGS: 05/06/22 MRI demonstrates Chronic infarcts in the pons especially on the right. Chronic lacunar infarcts in the thalamus bilaterally. Chronic infarcts in the basal ganglia bilaterally. Multiple foci of chronic hemorrhage involving the pons. Chronic microhemorrhage  in the right thalamus and right putamen. No hydrocephalus or mass lesion.  COGNITION: Overall cognitive status: Within functional limits for tasks assessed Areas of impairment:  Attention: Impaired: Sustained, Selective, Alternating, Divided Memory: Impaired: Working Proofreader function: Impaired: Organization, Planning, and Slow processing Functional deficits: Pt reports challenges in management of household tasks including schedule/bill/chore management, challenges in recalling information from conversations  COGNITIVE COMMUNICATION: Following directions: Follows one step commands consistently, requires repetition for moderately complex instructions such as those utilized during Art gallery manager comprehension: Impaired: requires repetition throughout session, suspect 2/2 attention impairment  Verbal expression: Impaired: c/b mazing, false starts/revisions, anomia, vague language  Functional communication: Impaired: Pt c/o difficulty organizing thoughts into cohesive verbal expression, c/o word finding, frustration resulting from   ORAL MOTOR EXAMINATION: Overall status: Did not assess  STANDARDIZED ASSESSMENTS: CLQT: Attention: WNL, Memory: WNL, Executive Function: WNL, Language: WNL, Visuospatial Skills: WNL, and Clock Drawing: WNL  INFORMAL ASSESSMENTS: Narrative task: Given photo prompt with x4 sequential photos, pt generates cohesive narrative. Given singular picture with cues for generating story with beginning middle and end, pt demonstrates false starts, mazing, rapid speech, and decreased organization. Story elements missing and no observable beginning middle end.   PATIENT REPORTED OUTCOME MEASURES (PROM): Cognitive Function: 73 "1" words on tip of tongue, trouble remembering if completed task, trouble remembering new information, walk into room and forgetting while there, trouble recalling names of familiar person, trouble thinking  clearly, trouble forming thoughts, thinking was slow, work hard to pay attention, trouble concentrating, trouble starting on simple tasks "2" difficulty planning for keeping appointments/remembering list of errands/learning new tasks, read something several times to understand it, trouble keeping track of what I was doing, difficulty doing more than one thing, reacting slowly "3" trouble making decisions, making simple mistakes more easily, difficulty planning an activity in advance, remembering where things are placed                                                                                                                            TREATMENT DATE:  09/16/23: eval only  PATIENT EDUCATION: Education details: POC, goals Person educated: Patient Education method: Medical illustrator Education comprehension: verbalized understanding   GOALS: Goals reviewed with patient? Yes  SHORT TERM GOALS: Target date: 10/14/2023  Pt will demonstrate word finding strategies during structured practice Baseline: no strategies Goal status: INITIAL  2. Pt will verbalize external or internal memory strategy to address functional memory mistakes Baseline: some compensations used liked calendar Goal status: INITIAL  3.  Pt will complete "plan" portion of goal-plan-do-review to address improved executive function for x2 complex home based tasks with min-A from SLP Baseline: no executive function strategies  Goal status: INITIAL   LONG TERM GOALS: Target date: 11/11/2023   Pt will utilize trained strategies to generate cohesive narrative, procedural, or expository discourse Baseline: decreased organization, linguistic mazing, false starts  Goal status: INITIAL  2.  Pt will report carryover of targeted memory/attention strategies in x5 instances with improved success in home environment Baseline: some compensations used liked calendar Goal status: INITIAL  3.  Pt will complete "do"   portion of goal-plan-do-review, and complete "review" portion with SLP to address improved executive function  Baseline: no executive function strategies  Goal status: INITIAL  ASSESSMENT:  CLINICAL IMPRESSION: Patient is a 38 y.o. F who was seen today for cognitive linguistic evaluation following multiple strokes. Evaluation completed included PROM with pt ID many functional deficits, see above. CLQT completed with pt scoring [facility] for all cognitive domains. Pt does present with decreased attention, evidenced by linguistic mazing, needing usual repetition for comprehension/recall of verbally presented information. Pt's subjective complaints and general presentation during evaluation does not correlate with standardized assessment results. Pt is requiring extra assistance for completion of household activities, is reporting challenges in spoken communication (receptive and expressive). Some compensatory strategies implemented such as use of calendar and phone reminders for appointments. Pt would benefit from short course of skilled ST to implement external cognitive aids, instruct for attention/memory strategies/compensations to address aforementioned deficits to improve QoL and maximize independence.   OBJECTIVE IMPAIRMENTS: include attention, memory, executive functioning, and expressive language. These impairments are limiting patient from household responsibilities and effectively communicating at home and in community. Factors affecting potential to achieve goals and functional outcome are financial resources. Patient will benefit from skilled SLP services to address above impairments and improve overall function.  REHAB POTENTIAL: Good  PLAN:  SLP FREQUENCY: 1x/week  SLP DURATION: 8 weeks  PLANNED INTERVENTIONS: Language facilitation, Cueing hierachy, Cognitive reorganization, Internal/external aids, Functional tasks, Multimodal communication approach, SLP instruction and feedback,  Compensatory strategies, Patient/family education, [phone] Treatment of speech (30 or 45 min) , and [id number]- Speech Eval Sound Prod, Artic, Phon, Eval Compre, Express  Graduate clinician Roylene Reason present for this session and participated in administration of therapeutic interventions under my supervision.   Maia Breslow, CCC-SLP 09/16/2023, 3:27 PM  Check all possible CPT codes: See Planned Interventions List for Planned CPT Codes    Check all conditions that are expected to impact treatment: Social determinants of health   If treatment provided at initial evaluation, no treatment charged due to lack of authorization.

## 2023-09-16 NOTE — Therapy (Signed)
OUTPATIENT PHYSICAL THERAPY NEURO EVALUATION   Patient Name: [name] MRN: [id number] DOB:[birth year], 38 y.o., female Today's Date: 09/16/2023   PCP: Angus Seller, NP REFERRING PROVIDER: Margie Ege, NP  END OF SESSION:  PT End of Session - 09/16/23 1116     Visit Number 1    Number of Visits 5    Date for PT Re-Evaluation 10/15/23    Authorization Type [place] medicaid prepaid    PT Start Time 1128   patient agreeable to start early   PT Stop Time 1215    PT Time Calculation (min) 47 min    Equipment Utilized During Treatment Gait belt    Activity Tolerance Patient tolerated treatment well    Behavior During Therapy WFL for tasks assessed/performed             Past Medical History:  Diagnosis Date   Anxiety    Cirrhosis of liver (HCC)    Cocaine use 08/04/2018   History of cocaine use 08/04/2018   Hypertension    Polysubstance abuse (HCC)    Renal disorder    Kidney Infection    Stroke (HCC)    x12   Past Surgical History:  Procedure Laterality Date   ARTERY REPAIR     CESAREAN SECTION     x 5   MOUTH SURGERY     TEAR DUCT PROBING     TUBAL LIGATION     VENTRAL HERNIA REPAIR N/A 10/03/2016   Procedure: OPEN INCARCERATED HERNIA REPAIR VENTRAL ADULT;  Surgeon: Axel Filler, MD;  Location: [facility] OR;  Service: General;  Laterality: N/A;   Patient Active Problem List   Diagnosis Date Noted   Cocaine use disorder, moderate, dependence (HCC) 06/04/2023   Opioid dependence in remission (HCC) 06/04/2023   Vitamin D deficiency 11/13/2022   GAD (generalized anxiety disorder) 10/09/2022   Intractable pain 05/30/2022   Hyperlipidemia 05/29/2022   PID (acute pelvic inflammatory disease) 05/12/2022   Tobacco abuse 05/12/2022   History of CVA (cerebrovascular accident) 05/12/2022   Pelvic abscess in female    Alcohol use disorder, severe, dependence (HCC) 05/02/2022   Hypokalemia 05/02/2022   Hepatic steatosis 05/01/2022   Abdominal pain 05/01/2022   Nausea  and vomiting 05/01/2022   Cocaine abuse (HCC) 05/01/2022   Acute CVA (cerebrovascular accident) (HCC) 03/21/2020   Hypertensive urgency 03/21/2020   Bronchitis 03/21/2020   Asymptomatic bacteriuria 03/21/2020   Acute respiratory failure with hypoxia (HCC) 08/01/2017   Substance abuse (HCC) 08/01/2017   Constipation 08/01/2017   Anemia 08/01/2017   Rectal bleeding 08/01/2017   Alcohol withdrawal (HCC) 07/31/2017   Ventral hernia 10/04/2016   Incarcerated ventral hernia 10/03/2016   Anxiety 02/15/2014   Hypertension 02/15/2014    ONSET DATE: 09/07/2023 referral  REFERRING DIAG: I63.81 (ICD-10-CM) - Multiple lacunar infarcts (HCC)  THERAPY DIAG:  Muscle weakness (generalized) - Plan: PT plan of care cert/re-cert  Unsteadiness on feet - Plan: PT plan of care cert/re-cert  Other lack of coordination - Plan: PT plan of care cert/re-cert  Abnormal posture - Plan: PT plan of care cert/re-cert  Rationale for Evaluation and Treatment: Rehabilitation  SUBJECTIVE:  SUBJECTIVE STATEMENT: Patient arrives to clinic alone- takes transportation. No AD. Reports she recently stopped a certain "chemical balance" rx due to insurance complications and now feels very scattered. She reports having "13-14 strokes." R knee surgery was last April and reports her walking is not the same and endorses difficulty with speech and UE. States she feels as though her L side was more impacted by CVAs. 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  Pt accompanied by: self  PERTINENT HISTORY: anxiety, polysubstance abuse, cirrhosis of liver, HTN, CVA x12  PAIN:  Are you having pain?  Typical aches  PRECAUTIONS: Fall   WEIGHT BEARING RESTRICTIONS: No  FALLS: Has patient fallen in last 6 months? Yes. Number of falls 3, balance  and vision impairment   LIVING ENVIRONMENT: Lives with: lives with their family Lives in: House/apartment Stairs: Yes: External: 9, 5 steps; on right going up Has following equipment at home: None  PLOF: Independent  PATIENT GOALS: "to walk better"  OBJECTIVE:  Note: Objective measures were completed at Evaluation unless otherwise noted.  DIAGNOSTIC FINDINGS: 05/06/22 IMPRESSION: Numerous small areas of acute infarct as described above. Correlate with recent history of hypertension or hypotension. Cerebral emboli possible however no cortical infarct is seen as would be expected with emboli.   Advanced for age chronic microvascular ischemia and multiple areas of chronic microhemorrhage. Correlate with risk factors for small vessel disease.  COGNITION: Overall cognitive status: Impaired   SENSATION: Light touch: Impaired  and B UE R> L  COORDINATION: Dysmetric L LE heel shin   POSTURE: No Significant postural limitations   LOWER EXTREMITY MMT:    MMT Right Eval Left Eval  Hip flexion 4 4-  Hip extension    Hip abduction 4+ 4+  Hip adduction 4+ 4+  Hip internal rotation    Hip external rotation    Knee flexion 4 4-  Knee extension 4+ 4-  Ankle dorsiflexion 4+ 4+  Ankle plantarflexion    Ankle inversion    Ankle eversion    (Blank rows = not tested)  BED MOBILITY:  Independent   TRANSFERS: Assistive device utilized: None  Sit to stand: Complete Independence Stand to sit: Complete Independence Chair to chair: Complete Independence   STAIRS: Level of Assistance: Modified independence Stair Negotiation Technique: Alternating Pattern  with Single Rail on Right Number of Stairs: 4  Height of Stairs: 6    GAIT: Gait pattern: step through pattern, decreased stride length, circumduction- Left, knee flexed in stance- Right, trendelenburg, and wide BOS Distance walked: clinic Assistive device utilized: None Level of assistance: Complete  Independence   FUNCTIONAL TESTS:   [facility] PT Assessment - 09/16/23 0001       Standardized Balance Assessment   Standardized Balance Assessment Timed Up and Go Test    Five times sit to stand comments  10.47s no UE    10 Meter Walk .27m/s      Timed Up and Go Test   Normal TUG (seconds) 7.72      Functional Gait  Assessment   Gait assessed  Yes    Gait Level Surface Walks 20 ft in less than 7 sec but greater than 5.5 sec, uses assistive device, slower speed, mild gait deviations, or deviates 6-10 in outside of the 12 in walkway width.    Change in Gait Speed Able to change speed, demonstrates mild gait deviations, deviates 6-10 in outside of the 12 in walkway width, or no gait deviations, unable to achieve a major change  in velocity, or uses a change in velocity, or uses an assistive device.    Gait with Horizontal Head Turns Performs head turns smoothly with slight change in gait velocity (eg, minor disruption to smooth gait path), deviates 6-10 in outside 12 in walkway width, or uses an assistive device.    Gait with Vertical Head Turns Performs task with slight change in gait velocity (eg, minor disruption to smooth gait path), deviates 6 - 10 in outside 12 in walkway width or uses assistive device    Gait and Pivot Turn Pivot turns safely in greater than 3 sec and stops with no loss of balance, or pivot turns safely within 3 sec and stops with mild imbalance, requires small steps to catch balance.    Step Over Obstacle Is able to step over one shoe box (4.5 in total height) without changing gait speed. No evidence of imbalance.    Gait with Narrow Base of Support Ambulates 7-9 steps.    Gait with Eyes Closed Walks 20 ft, uses assistive device, slower speed, mild gait deviations, deviates 6-10 in outside 12 in walkway width. Ambulates 20 ft in less than 9 sec but greater than 7 sec.    Ambulating Backwards Walks 20 ft, uses assistive device, slower speed, mild gait deviations, deviates 6-10  in outside 12 in walkway width.    Steps Alternating feet, must use rail.    Total Score 20              PATIENT SURVEYS:  ABC scale 85.6%                                                                                                                              TREATMENT  N/a eval   PATIENT EDUCATION: Education details: PT POC, exam findings Person educated: Patient Education method: Explanation Education comprehension: verbalized understanding and needs further education  HOME EXERCISE PROGRAM: To be provided  GOALS: Goals reviewed with patient? Yes  SHORT TERM GOALS: =LTG based on PT POC length   LONG TERM GOALS: Target date: 10/15/23  Pt will be independent with final HEP for improved functional strength and balance  Baseline: to be provided Goal status: INITIAL  2.  Pt will improve FGA to >/= 25/30 to demonstrate improved balance and reduced fall risk  Baseline: 20/30 Goal status: INITIAL  3.  Pt will improve gait speed to >/= .37m/s to demonstrate improved community ambulation  Baseline: .30m/s Goal status: INITIAL  ASSESSMENT:  CLINICAL IMPRESSION: Patient is a 38 y.o. female who was seen today for physical therapy evaluation and treatment for mobility impairment s/p multiple lacunar infarcts. Her mobility is further impaired by a R knee sx that left her with residual weakness. Five times Sit to Stand Test (FTSS) Method: Use a straight back chair with a solid seat that is 17-18" high. Ask participant to sit on the chair with arms folded across their chest.   Instructions: "Stand up  and sit down as quickly as possible 5 times, keeping your arms folded across your chest."   Measurement: Stop timing when the participant touches the chair in sitting the 5th time.  TIME: 10.47 sec  Cut off scores indicative of increased fall risk: >12 sec CVA, >16 sec PD, >13 sec vestibular (ANPTA Core Set of Outcome Measures for Adults with Neurologic Conditions,  2018). Patient completed the Timed Up and Go test (TUG) in 7.72 seconds.  Geriatrics: need for further assessment of fall risk: >= 12 sec; Recurrent falls: > 15 sec; Vestibular Disorders fall risk: > 15 sec; Parkinson's Disease fall risk: > 16 sec ([email or web address], 2023). 10 Meter Walk Test: Patient instructed to walk 10 meters (32.8 ft) as quickly and as safely as possible at their normal speed x2 and at a fast speed x2. Time measured from 2 meter mark to 8 meter mark to accommodate ramp-up and ramp-down.  Normal speed: .42m/s Cut off scores: <0.4 m/s = household Ambulator, 0.4-0.8 m/s = limited community Ambulator, >0.8 m/s = community Ambulator, >1.2 m/s = crossing a street, <1.0 = increased fall risk MCID 0.05 m/s (small), 0.13 m/s (moderate), 0.06 m/s (significant)  (ANPTA Core Set of Outcome Measures for Adults with Neurologic Conditions, 2018). Patient demonstrates increased fall risk as noted by score of 20/30 on  Functional Gait Assessment.   <22/30 = predictive of falls, <20/30 = fall in 6 months, <18/30 = predictive of falls in PD MCID: 5 points stroke population, 4 points geriatric population (ANPTA Core Set of Outcome Measures for Adults with Neurologic Conditions, 2018). She would benefit from skilled PT services to address the above mentioned deficits.    OBJECTIVE IMPAIRMENTS: Abnormal gait, cardiopulmonary status limiting activity, decreased balance, decreased cognition, decreased coordination, decreased endurance, decreased knowledge of condition, decreased strength, impaired perceived functional ability, and impaired vision/preception.   ACTIVITY LIMITATIONS: carrying, lifting, squatting, stairs, locomotion level, and caring for others  PARTICIPATION LIMITATIONS: interpersonal relationship, driving, shopping, community activity, and occupation  PERSONAL FACTORS: Behavior pattern, Past/current experiences, Social background, Time since onset of injury/illness/exacerbation,  Transportation, and 3+ comorbidities: see above  are also affecting patient's functional outcome.   REHAB POTENTIAL: Fair time since onset, comorbidities  CLINICAL DECISION MAKING: Stable/uncomplicated  EVALUATION COMPLEXITY: Low  PLAN:  PT FREQUENCY: 1x/week  PT DURATION: 4 weeks  PLANNED INTERVENTIONS: 97164- PT Re-evaluation, 97110-Therapeutic exercises, 97530- Therapeutic activity, 97112- Neuromuscular re-education, 97535- Self Care, 84696- Manual therapy, [phone]- Gait training, [phone]- Orthotic Fit/training, [phone]- Aquatic Therapy, Patient/Family education, Balance training, Stair training, Taping, Dry Needling, Vestibular training, Visual/preceptual remediation/compensation, Cognitive remediation, and DME instructions  PLAN FOR NEXT SESSION: HEP, functional strength, balance   Westley Foots, PT Westley Foots, PT, DPT, CBIS  09/16/2023, 2:18 PM    For all possible CPT codes, reference the Planned Interventions line above.     Check all conditions that are expected to impact treatment: {Conditions expected to impact treatment:Neurological condition and/or seizures, Psychological or psychiatric disorders, and Social determinants of health   If treatment provided at initial evaluation, no treatment charged due to lack of authorization.

## 2023-09-16 NOTE — Therapy (Signed)
OUTPATIENT OCCUPATIONAL THERAPY NEURO EVALUATION  Patient Name: [name] MRN: [id number] DOB:30-May-1986, 38 y.o., female Today's Date: 09/16/2023  PCP: Ivonne Andrew, NP REFERRING PROVIDER: Glean Salvo, NP  END OF SESSION:  OT End of Session - 09/16/23 1203     Visit Number 1    Number of Visits 5   including evaluation   Date for OT Re-Evaluation 10/22/23    Authorization Type [employer] Medicaid 2025    OT Start Time 1230    OT Stop Time 1315    OT Time Calculation (min) 45 min    Equipment Utilized During Treatment Testing material    Activity Tolerance Patient tolerated treatment well    Behavior During Therapy WFL for tasks assessed/performed             Past Medical History:  Diagnosis Date   Anxiety    Cirrhosis of liver (HCC)    Cocaine use 08/04/2018   History of cocaine use 08/04/2018   Hypertension    Polysubstance abuse (HCC)    Renal disorder    Kidney Infection    Stroke (HCC)    x12   Past Surgical History:  Procedure Laterality Date   ARTERY REPAIR     CESAREAN SECTION     x 5   MOUTH SURGERY     TEAR DUCT PROBING     TUBAL LIGATION     VENTRAL HERNIA REPAIR N/A 10/03/2016   Procedure: OPEN INCARCERATED HERNIA REPAIR VENTRAL ADULT;  Surgeon: Axel Filler, MD;  Location: [facility] OR;  Service: General;  Laterality: N/A;   Patient Active Problem List   Diagnosis Date Noted   Cocaine use disorder, moderate, dependence (HCC) 06/04/2023   Opioid dependence in remission (HCC) 06/04/2023   Vitamin D deficiency 11/13/2022   GAD (generalized anxiety disorder) 10/09/2022   Intractable pain 05/30/2022   Hyperlipidemia 05/29/2022   PID (acute pelvic inflammatory disease) 05/12/2022   Tobacco abuse 05/12/2022   History of CVA (cerebrovascular accident) 05/12/2022   Pelvic abscess in female    Alcohol use disorder, severe, dependence (HCC) 05/02/2022   Hypokalemia 05/02/2022   Hepatic steatosis 05/01/2022   Abdominal pain 05/01/2022   Nausea  and vomiting 05/01/2022   Cocaine abuse (HCC) 05/01/2022   Acute CVA (cerebrovascular accident) (HCC) 03/21/2020   Hypertensive urgency 03/21/2020   Bronchitis 03/21/2020   Asymptomatic bacteriuria 03/21/2020   Acute respiratory failure with hypoxia (HCC) 08/01/2017   Substance abuse (HCC) 08/01/2017   Constipation 08/01/2017   Anemia 08/01/2017   Rectal bleeding 08/01/2017   Alcohol withdrawal (HCC) 07/31/2017   Ventral hernia 10/04/2016   Incarcerated ventral hernia 10/03/2016   Anxiety 02/15/2014   Hypertension 02/15/2014    ONSET DATE: 09/07/2023  REFERRING DIAG: I63.81 (ICD-10-CM) - Multiple lacunar infarcts  THERAPY DIAG:  Muscle weakness (generalized)  Other lack of coordination  Other disturbances of skin sensation  Visuospatial deficit  Frontal lobe and executive function deficit  Other symptoms and signs involving the nervous system  Rationale for Evaluation and Treatment: Rehabilitation  SUBJECTIVE:   SUBJECTIVE STATEMENT: Pt accompanied by: self - Medical transportation    Pt reported to PT that  she recently stopped a certain "chemical balance" medication due to insurance complications and now feels very scattered. Pt reports she has had 13-14 strokes and a R knee surgery last April and reports her walking is not the same and reports difficulty with speech and UE. Pt noted that she feels as though her L side was more  impacted by CVAs but has pain and discomfort in R UE. Patient also reported medial R eye blind spot to PT.   Pt noted that she puts off making appts due to her anxiety with making arrangements for transportation.  Pt also reports that she has gained weight in past 2 years.  She is a current smoker but is out of cigarettes and does not have ease access to get beer, therefore has decreased alcohol intake recently also. PT denies any recent drug use.  PERTINENT HISTORY:   PMHx: HTN, CVA, anxiety, h/o substance/drug abuse, current tobacco  use  PRECAUTIONS: Fall - Allergies: Latex, Peanuts etc  WEIGHT BEARING RESTRICTIONS: No  PAIN:  Are you having pain? Yes: NPRS scale: 8/10 ("that's good cause its usually really bad") Pain location: R hand and back Pain description: strain/warning pain - can't move past this point  Aggravating factors: moving Relieving factors: resting  FALLS: Has patient fallen in last 6 months? Yes. Number of falls 3 - 2 outside, 1 inside - not paying attention  LIVING ENVIRONMENT: Lives with: lives with their family - mother Lives in: House/apartment Stairs: Yes: External: 8-9 at back and 4 at front  steps; bilateral but cannot reach both Has following equipment at home: None  PLOF: Independent  PATIENT GOALS: remember stuff, fasten bra, holding things better   OBJECTIVE:  Note: Objective measures were completed at Evaluation unless otherwise noted.  HAND DOMINANCE: Right  ADLs: Pt has 5 children - 14-78 yo but not in her custody.  Pt has history of incarceration Overall ADLs: Ind Transfers/ambulation related to ADLs: Ind with no AE Eating: Ind Grooming: Ind UB Dressing: Ind but with trouble fastening bra behind her back LB Dressing: Ind Toileting: Ind Bathing: Ind Tub Shower transfers: Ind Equipment: none  IADLs: Shopping: Ind - Uber, Bus to [employer], Medical transportation to [employer]: Has been helping but is feeling more lazy and not wanting to do anything Meal Prep: Ind - Pt reports liking to cook.  Made meatloaf etc last night Community mobility: Assistance with community transportation - but has to call to set it up Medication management: Ind - has pill box she wants to start using as she currently sorts her mothers medication for her Financial management: Ind Handwriting:  TBA - due to time constraints - did not observe but pt had noted it wasn't as good as before  MOBILITY STATUS: Independent  POSTURE COMMENTS:  rounded shoulders and forward  head Sitting balance: WNL  ACTIVITY TOLERANCE: Activity tolerance: Pt admits to napping in the day and trouble sleepingin the day  FUNCTIONAL OUTCOME MEASURES: Quick Dash: 43.2  UPPER EXTREMITY ROM:    Active ROM Right eval Left eval  Shoulder flexion [facility]  Shoulder abduction    Shoulder adduction    Shoulder extension    Shoulder internal rotation    Shoulder external rotation    Elbow flexion [facility] WFL  Elbow extension [facility] WFL  Wrist flexion    Wrist extension    Wrist ulnar deviation    Wrist radial deviation    Wrist pronation    Wrist supination Decreased end range Decreased end range  (Blank rows = not tested)  UPPER EXTREMITY MMT:     MMT Right eval Left eval  Shoulder flexion 4- 4-  Shoulder abduction    Shoulder adduction    Shoulder extension    Shoulder internal rotation    Shoulder external rotation    Middle trapezius  Lower trapezius    Elbow flexion    Elbow extension    Wrist flexion    Wrist extension    Wrist ulnar deviation    Wrist radial deviation    Wrist pronation    Wrist supination    (Blank rows = not tested)  HAND FUNCTION: Grip strength: Right: 51, 55, 60  lbs; Left: 46, 54, 54 lbs Average: Right 55.3 lbs Left 51.3 lbs  COORDINATION: 9 Hole Peg test: Right: 28.66  sec; Left: 29.66  sec Box and Blocks:  Right 45 blocks, Left 40 blocks  SENSATION: Light touch: Impaired  and Tips of R hand are tingly and numb with L middle/ring finger  EDEMA: yes  MUSCLE TONE: Some abnormalities  COGNITION: Overall cognitive status: Impaired and Pt reports difficulty with word finding, memory etc. She reported putting off calling to set up medical transportation because of her difficulty getting past the automated system to talk to a person who can understand her better.  VISION: Subjective report: "I dont know what happened to it.I used to have good vision." Pt reports some difficulty with double vision x 5-6 years but no recent eye  dr.  Rock Nephew does not if she misses medicine it make her vision worse.  Baseline vision: No visual deficits prior to strokes Visual history: NA   VISION ASSESSMENT: Not tested - TBA                                                                                                               TREATMENT DATE:    NA - due to payor source Pt provided written instructions to check with an agent when setting up medical transportation, to see if there is a direct line to call to avoid the automated system which she finds difficult to navigate.  PATIENT EDUCATION: Education details: OT role and POC considerations. Person educated: Patient Education method: Explanation Education comprehension: verbalized understanding and needs further education  HOME EXERCISE PROGRAM: TBD   GOALS: Goals reviewed with patient? Yes  LONG TERM GOALS: Target date: 10/22/23  Patient will demonstrate updated B UE HEP with visual handouts only for proper execution for B UE strength, coordination and sensory stimulation. Baseline: New to outpt OT  Goal status: INITIAL  2.  Patient will demonstrate diplopia HEP with visual handouts only for proper execution. Baseline: New to outpt OT Goal status: INITIAL  3.  Patient will demonstrate at > 55 lbs B grip strength as needed to open jars and other containers. Baseline: Right 55.3 lbs Left 51.3 lbs Goal status: INITIAL  4.  Pt will be able to place at least 45-50 blocks using B hand with completion of Box and Blocks test. Baseline: Right 45 blocks, Left 40 blocks Goal status: INITIAL  5.  Pt will verbalize understanding of ways to keep thinking skills sharp and ways to compensate for STM changes with Memory Binder.  Baseline: New to outpt OT Goal status: INITIAL  6.  Pt will independently recall 4/6 sensory precautions (cold, heat, sharp,  fragile, chemical, and heavy) as needed to prevent injury/harm secondary to impairments.   Baseline: New to outpt OT Goal  status: INITIAL  7. Patient will demonstrate improvement with quick Dash score indicating improved functional use of affected extremity.  Baseline: Quick Dash: 43.2  Goal Status: INITIAL   ASSESSMENT:  CLINICAL IMPRESSION: Patient is a 38 y.o. female who was seen today for occupational therapy evaluation for multiple strokes (13 by pt report) and referral dx of multiple lacunar infarcts and residual UE weakness and incoordination. Hx includes HTN, CVA, anxiety, h/o substance/drug abuse, current tobacco use. Patient currently presents slightly below baseline level of function demonstrating decreased confidence in her grasp (esp RUE) and difficulty with fasteners etc. Pt would benefit from skilled OT services in the outpatient setting to work on impairments as noted below to help pt return to PLOF as able.    PERFORMANCE DEFICITS: in functional skills including ADLs, IADLs, coordination, dexterity, sensation, edema, tone, ROM, strength, pain, flexibility, Fine motor control, Gross motor control, endurance, decreased knowledge of precautions, decreased knowledge of use of DME, and UE functional use, cognitive skills including attention, emotional, energy/drive, learn, memory, problem solving, safety awareness, sequencing, temperament/personality, and understand, and psychosocial skills including coping strategies, environmental adaptation, habits, interpersonal interactions, and routines and behaviors.   IMPAIRMENTS: are limiting patient from ADLs, IADLs, rest and sleep, leisure, and social participation.   CO-MORBIDITIES: may have co-morbidities  that affects occupational performance. Patient will benefit from skilled OT to address above impairments and improve overall function.  MODIFICATION OR ASSISTANCE TO COMPLETE EVALUATION: No modification of tasks or assist necessary to complete an evaluation.  OT OCCUPATIONAL PROFILE AND HISTORY: Problem focused assessment: Including review of records  relating to presenting problem.  CLINICAL DECISION MAKING: LOW - limited treatment options, no task modification necessary  REHAB POTENTIAL: Good  EVALUATION COMPLEXITY: Low    PLAN:  OT FREQUENCY: 1x/week  OT DURATION: 4 weeks + evaluation  PLANNED INTERVENTIONS: 97535 self care/ADL training, 16109 therapeutic exercise, 97530 therapeutic activity, 97112 neuromuscular re-education, visual/perceptual remediation/compensation, psychosocial skills training, energy conservation, coping strategies training, patient/family education, and DME and/or AE instructions  RECOMMENDED OTHER SERVICES: Pt receive PT and ST evaluations the same day as OT eval.  Pt needs to follow up with eye doctor.  CONSULTED AND AGREED WITH PLAN OF CARE: Patient  PLAN FOR NEXT SESSION:  HEPs - 1) putty, 2) coordination, 3) diplopia, 4) theraband Memory Compensation strategies/Memory Notebook Sensory stimulation activities  Clothing fasteners  Victorino Sparrow, OT 09/16/2023, 4:21 PM

## 2023-09-17 ENCOUNTER — Telehealth (HOSPITAL_COMMUNITY): Payer: Self-pay

## 2023-09-17 NOTE — Telephone Encounter (Signed)
Medication management - Telephone call with patient's Mother two times today as she was upset patient had not recevied her Abilify 2 mg with a taper to go up to 2 a day. Agreed to follow up with pt's insurance to find out what was holding up the patient authorization for the medication and to make sure her insurance company had all the information for approval.  Upon the call back with pt's Mother, informed this nurse had resubmitted the request for Abilify 2 mg, with directions to take 1 for 8 days and then to go up to 2 a day, #172 ordered for a 90 day supply. Informed this information had been sent to patient's [place] [employer] plan through [employer] for review and pending decision.  Informed collateral this could take 24-72 hours for approval but would keep checking to see if this gets approved today. Informed collateral if not approved today to have pt's pharmacy run the prescription each day to see if approval goes through and if not we could recheck in the coming week. Collateral agreed with plan at this time.

## 2023-09-20 NOTE — Telephone Encounter (Signed)
Read and acknowledged. Appreciate nurse assistance regarding this manner. Please advise if I am able to provide any additional help with what is going on.  -Dr. Hazle Quant

## 2023-09-22 ENCOUNTER — Telehealth (HOSPITAL_COMMUNITY): Payer: Self-pay | Admitting: *Deleted

## 2023-09-22 NOTE — Telephone Encounter (Signed)
Fax received for Aripiprazole 2mg . Submitted online with cover my meds since this writer could not find where one had been submitted or approved.  Awaiting decision.

## 2023-09-22 NOTE — Telephone Encounter (Signed)
Fax received for approval of Aripiprazole 2mg  until 09/16/24. [id number]. Pharmacy notified.

## 2023-09-27 ENCOUNTER — Ambulatory Visit: Payer: Self-pay | Admitting: Nurse Practitioner

## 2023-10-07 ENCOUNTER — Encounter: Payer: Self-pay | Admitting: Obstetrics and Gynecology

## 2023-10-07 ENCOUNTER — Ambulatory Visit (INDEPENDENT_AMBULATORY_CARE_PROVIDER_SITE_OTHER): Payer: Medicaid Other | Admitting: Obstetrics and Gynecology

## 2023-10-07 ENCOUNTER — Other Ambulatory Visit (HOSPITAL_COMMUNITY)
Admission: RE | Admit: 2023-10-07 | Discharge: 2023-10-07 | Disposition: A | Discharge status: Home / Self Care | Payer: Medicaid Other | Source: Ambulatory Visit | Attending: Obstetrics and Gynecology | Admitting: Obstetrics and Gynecology

## 2023-10-07 VITALS — BP 166/111 | HR 62 | Ht 60.0 in | Wt 174.0 lb

## 2023-10-07 DIAGNOSIS — Z1339 Encounter for screening examination for other mental health and behavioral disorders: Secondary | ICD-10-CM

## 2023-10-07 DIAGNOSIS — N939 Abnormal uterine and vaginal bleeding, unspecified: Secondary | ICD-10-CM | POA: Diagnosis present

## 2023-10-07 DIAGNOSIS — Z3202 Encounter for pregnancy test, result negative: Secondary | ICD-10-CM

## 2023-10-07 LAB — POCT URINE PREGNANCY: Preg Test, Ur: NEGATIVE

## 2023-10-07 NOTE — Progress Notes (Signed)
38 yo P5 with LMP 09/25/23 and BMI 33 presenting for the evaluation of menorrhagia and pelvic pain. Patient states that over the past 3 years, she has experienced a monthly period lasting more than 10 days associated with significant dysmenorrhea. Patient states NSAID and tylenol do not help with her dysmenorrhea. She is not sexually active. She is without any other complaints  Past Medical History:  Diagnosis Date   Anxiety    Cirrhosis of liver (HCC)    Cocaine use 08/04/2018   History of cocaine use 08/04/2018   Hypertension    Polysubstance abuse (HCC)    Renal disorder    Kidney Infection    Stroke (HCC)    x12   Past Surgical History:  Procedure Laterality Date   ARTERY REPAIR     CESAREAN SECTION     x 5   MOUTH SURGERY     TEAR DUCT PROBING     TUBAL LIGATION     VENTRAL HERNIA REPAIR N/A 10/03/2016   Procedure: OPEN INCARCERATED HERNIA REPAIR VENTRAL ADULT;  Surgeon: Axel Filler, MD;  Location: [facility] OR;  Service: General;  Laterality: N/A;   Family History  Problem Relation Age of Onset   Aortic stenosis Mother    Stroke Father    Hyperlipidemia Father    Hypertension Father    Alpha-1 antitrypsin deficiency Brother    Heart disease Brother    CAD Other    Diabetes Other    Hypertension Other    Cancer Other    Thyroid disease Other    Social History   Tobacco Use   Smoking status: Every Day    Current packs/day: 0.50    Types: Cigarettes   Smokeless tobacco: Never  Vaping Use   Vaping status: Never Used  Substance Use Topics   Alcohol use: Yes    Comment: daily   Drug use: Not Currently    Types: Cocaine   ROS See pertinent in HPI. All other systems reviewed and non contributory Blood pressure (!) 161/105, pulse 66, height 5' (1.524 m), weight 174 lb (78.9 kg), last menstrual period 09/25/2023. GENERAL: Well-developed, well-nourished female in no acute distress.  BREASTS: Symmetric in size. No palpable masses or lymphadenopathy, skin changes, or  nipple drainage. ABDOMEN: Soft, nontender, nondistended. No organomegaly. PELVIC: Normal external female genitalia. Vagina is pink and rugated.  Normal discharge. Normal appearing cervix. Uterus is normal in size. No adnexal mass or tenderness. Chaperone present during the pelvic exam EXTREMITIES: No cyanosis, clubbing, or edema, 2+ distal pulses.  A/P 38 yo with AUB - Discussed medical management with lysteda pending ultrasound and endometrial biopsy. - Discussed medical management with levnorgestrel IUD or other homonal contraception for cycle control. Patient is interested in hysterectomy Given history of 5 previous cesarean section, patient will be scheduled with Dr. Briscoe Deutscher for consultation on robotic assisted vaginal hysterectomy - pelvic ultrasound ordered - ENDOMETRIAL BIOPSY     The indications for endometrial biopsy were reviewed.   Risks of the biopsy including cramping, bleeding, infection, uterine perforation, inadequate specimen and need for additional procedures  were discussed. The patient states she understands and agrees to undergo procedure today. Consent was signed. Time out was performed. Urine HCG was negative. A sterile speculum was placed in the patient's vagina and the cervix was prepped with Betadine. A single-toothed tenaculum was placed on the anterior lip of the cervix to stabilize it. The uterine cavity was sounded to a depth of 5 cm using the uterine sound. The  3 mm pipelle was introduced into the endometrial cavity without difficulty, 2 passes were made.  A  scant amount of tissue was  sent to pathology. The patient did not tolerate the procedure well and I fear only endocervical sampling was performed. The instruments were removed from the patient's vagina. Minimal bleeding from the cervix was noted.  Routine post-procedure instructions were given to the patient. T  - Patient will be contacted with results

## 2023-10-07 NOTE — Progress Notes (Signed)
38 y.o. New GYN presents for AUB, Pt painful 7-10/10, heavy, long periods lasting 10+ days x 3+ years.

## 2023-10-11 LAB — SURGICAL PATHOLOGY

## 2023-10-12 MED ORDER — MISOPROSTOL 200 MCG PO TABS: ORAL_TABLET | ORAL | 1 refills | Status: AC

## 2023-10-12 NOTE — Progress Notes (Signed)
Pt informed of unsuccessful EMB and recommendations for cytotec at 12 N on the day of her appt with Dr. Briscoe Deutscher. Pt repeated back instructions and will plan to pick up med on the Mon before her appt to avoid RX delays. All questions were answered. Pt verbalized understanding.

## 2023-10-12 NOTE — Addendum Note (Signed)
Addended by: Catalina Antigua on: 10/12/2023 09:07 AM   Modules accepted: Orders

## 2023-10-13 ENCOUNTER — Ambulatory Visit (HOSPITAL_BASED_OUTPATIENT_CLINIC_OR_DEPARTMENT_OTHER): Payer: Medicaid Other

## 2023-10-19 ENCOUNTER — Ambulatory Visit (HOSPITAL_BASED_OUTPATIENT_CLINIC_OR_DEPARTMENT_OTHER): Payer: Medicaid Other

## 2023-10-20 ENCOUNTER — Ambulatory Visit (HOSPITAL_BASED_OUTPATIENT_CLINIC_OR_DEPARTMENT_OTHER): Payer: Medicaid Other

## 2023-10-22 ENCOUNTER — Ambulatory Visit (HOSPITAL_BASED_OUTPATIENT_CLINIC_OR_DEPARTMENT_OTHER): Payer: Medicaid Other

## 2023-10-27 ENCOUNTER — Telehealth: Payer: Self-pay

## 2023-10-27 NOTE — Telephone Encounter (Signed)
 Caller & Relationship to patient:  MRN #  [id number]   Call Back Number: [phone]  Date of Last Office Visit: 09/03/2023     Date of Next Office Visit: 11/24/2023    Medication(s) to be Refilled: Clonidine, Plavix, Zofran, Pantoprazole and Vit D  Preferred Pharmacy: [facility] on [address]  ** Please notify patient to allow 48-72 hours to process** **Let patient know to contact pharmacy at the end of the day to make sure medication is ready. ** **If patient has not been seen in a year or longer, book an appointment **Advise to use MyChart for refill requests OR to contact their pharmacy

## 2023-10-28 ENCOUNTER — Ambulatory Visit: Payer: Self-pay | Admitting: Nurse Practitioner

## 2023-10-28 ENCOUNTER — Ambulatory Visit: Payer: Medicaid Other | Admitting: Obstetrics and Gynecology

## 2023-10-28 ENCOUNTER — Other Ambulatory Visit: Payer: Self-pay

## 2023-10-28 DIAGNOSIS — E559 Vitamin D deficiency, unspecified: Secondary | ICD-10-CM

## 2023-10-28 MED ORDER — CLOPIDOGREL BISULFATE 75 MG PO TABS: 75.0000 mg | ORAL_TABLET | Freq: Every day | ORAL | 2 refills | Status: DC

## 2023-10-28 MED ORDER — CLONIDINE HCL 0.1 MG PO TABS: 0.1000 mg | ORAL_TABLET | Freq: Two times a day (BID) | ORAL | 2 refills | Status: DC

## 2023-10-28 MED ORDER — PANTOPRAZOLE SODIUM 40 MG PO TBEC: 40.0000 mg | DELAYED_RELEASE_TABLET | Freq: Every day | ORAL | 0 refills | Status: DC

## 2023-10-28 NOTE — Telephone Encounter (Signed)
 Done [facility]

## 2023-10-28 NOTE — Telephone Encounter (Signed)
 Please advise [facility]

## 2023-10-29 MED ORDER — VITAMIN D (ERGOCALCIFEROL) 1.25 MG (50000 UNIT) PO CAPS: 50000.0000 [IU] | ORAL_CAPSULE | ORAL | 3 refills | Status: DC

## 2023-10-29 MED ORDER — ONDANSETRON HCL 8 MG PO TABS: 8.0000 mg | ORAL_TABLET | Freq: Three times a day (TID) | ORAL | 2 refills | Status: DC | PRN

## 2023-11-07 NOTE — Progress Notes (Deleted)
 Psychiatric Follow Up Adult Assessment  Patient Identification: [name] MRN:  [id number] Date of Evaluation:  11/07/2023  Assessment:  [name] is a 38 y.o. female with a history of anxiety, depression, opiate use disorder on suboxone, stimulant use disorder-cocaine, alcohol dependence, CVA, nicotine dependence, hypertension, hyperlipidemia, vitamin D deficiency who presents in person to [facility] for medication management.  While she did not notice significant improvement with the Abilify, she states that her mom reported that she had been less labile and irritable.  She had initially felt some daytime sedation after increase in hydroxyzine and starting Abilify, she reports this improved after some time. She reports being unable to fill the abilify after the 1st month which appears to have been due to not scheduling an appointment and missing her last one. Plan to discuss CDIOP and substance use counseling next visit.   Plan:  # Substance induced mood/anxiety disorder # Cocaine Use Disorder, in early remission # Alcohol Use Disorder, in early remission # r/o GAD w/ panic  # r/o PTSD Past medication trials:  Status of problem: active Interventions: Last cocaine October 2024 -1-2 40 oz every 2-3 days -- advise cessation -- consider naltrexone if difficulty with cessation -- continue lexapro 20 mg -- abilify 2 mg for 8 days then INCREASE to 4 mg daily as an adjunct -- continue hydroxyzine 25 mg tid prn  # Opiate use disorder Past medication trials:  Status of problem: active Interventions: -- on suboxone 8-2 tid via [facility]Stop  #Nicotine dependence -precontemplative, advise cessation  Return to care in 1 month  Patient was given contact information for behavioral health clinic and was instructed to call 911 for emergencies.    Patient and plan of care will be discussed with the Attending MD, Dr. Josephina Shih, who agrees with the above  statement and plan.   Subjective:  Chief Complaint: Medication Management  Interval History: Patient presents for follow-up for medication management.  She reports overall doing well.  She does report some worsening irritability after running out of her Abilify.  She reports when she had started Abilify last appointment, she did not notice significant difference but her mother reports she was significant less irritable and labile.  She denies present SI/HI/AVH.  She reported some daytime grogginess when she started the Abilify but reports it subsided after approximately 1 week.  She also reported that this could have been secondary to the increase in hydroxyzine.  She has been taking hydroxyzine as needed and reports mild sedation but overall it does help with her anxiety.  She denies any recent substance use with the exception of alcohol (40 oz of beer every 2-3 days). She denies any craving for substances. She would like to be restarted on the Abilify and increased from 2 mg as she reports having some residual irritability when she was on the 2 mg of abilify. She overall reports appropriate sleep and appetite.  She continues to live with mom and is still working to get disability.  Past Psychiatric History:  Diagnoses: MDD, GAD, PTSD Medication trials: lexapro, celexa, clonazepam Previous psychiatrist/therapist: when teenager had a psychiatrist, never had a therapist Hospitalizations: denies Suicide attempts: denies SIB: denies Hx of violence towards others: denies Current access to guns: denies Hx of trauma/abuse: endorses  Substance Abuse History in the last 12 months:  Yes.    Past Medical History:  Past Medical History:  Diagnosis Date   Anemia    Anxiety    Arthritis  Blood transfusion without reported diagnosis    Cirrhosis of liver (HCC)    Cocaine use 08/04/2018   Depression    Dyspnea    Gestational diabetes    History of cocaine use 08/04/2018   Hypertension     Polysubstance abuse (HCC)    Pregnancy induced hypertension    Renal disorder    Kidney Infection    Stroke (HCC)    x12   Vaginal Pap smear, abnormal     Past Surgical History:  Procedure Laterality Date   ARTERY REPAIR     CESAREAN SECTION     x 5   MOUTH SURGERY     TEAR DUCT PROBING     TONSILLECTOMY     TUBAL LIGATION     VENTRAL HERNIA REPAIR N/A 10/03/2016   Procedure: OPEN INCARCERATED HERNIA REPAIR VENTRAL ADULT;  Surgeon: Axel Filler, MD;  Location: [facility] OR;  Service: General;  Laterality: N/A;      Family History:  Family History  Problem Relation Age of Onset   Cancer Mother    Aortic stenosis Mother    Stroke Father    Hyperlipidemia Father    Hypertension Father    Alpha-1 antitrypsin deficiency Brother    Heart disease Brother    Cancer Maternal Aunt    Cancer Paternal Aunt    CAD Other    Diabetes Other    Hypertension Other    Cancer Other    Thyroid disease Other     Social History:   Academic/Vocational: Unemployed Social History   Socioeconomic History   Marital status: Single    Spouse name: Not on file   Number of children: 4   Years of education: Not on file   Highest education level: 9th grade  Occupational History   Not on file  Tobacco Use   Smoking status: Every Day    Current packs/day: 0.50    Types: Cigarettes   Smokeless tobacco: Never  Vaping Use   Vaping status: Never Used  Substance and Sexual Activity   Alcohol use: Yes    Comment: daily   Drug use: Not Currently    Types: Cocaine   Sexual activity: Not Currently    Birth control/protection: Surgical  Other Topics Concern   Not on file  Social History Narrative   Not on file   Social Drivers of Health   Financial Resource Strain: High Risk (04/15/2023)   Overall Financial Resource Strain (CARDIA)    Difficulty of Paying Living Expenses: Very hard  Food Insecurity: Food Insecurity Present (04/15/2023)   Hunger Vital Sign    Worried About Running Out of  Food in the Last Year: Often true    Ran Out of Food in the Last Year: Often true  Transportation Needs: Unmet Transportation Needs (04/15/2023)   PRAPARE - Administrator, Civil Service (Medical): Yes    Lack of Transportation (Non-Medical): Yes  Physical Activity: Not on file  Stress: Not on file  Social Connections: Unknown (04/15/2023)   Social Connection and Isolation Panel [NHANES]    Frequency of Communication with Friends and Family: More than three times a week    Frequency of Social Gatherings with Friends and Family: Three times a week    Attends Religious Services: Patient declined    Active Member of Clubs or Organizations: No    Attends Banker Meetings: Not on file    Marital Status: Never married    Additional Social History: updated  Allergies:   Allergies  Allergen Reactions   Ibuprofen Anaphylaxis, Swelling and Other (See Comments)    Patient thinks it may have caused her throat to "swell"  (** PATIENT HAS BEEN TOLERATING THIS IN 2023**)   Latex Shortness Of Breath   Peanut-Containing Drug Products Anaphylaxis   Penicillins Anaphylaxis and Hives    Has patient had a PCN reaction causing immediate rash, facial/tongue/throat swelling, SOB or lightheadedness with hypotension: Yes Has patient had a PCN reaction causing severe rash involving mucus membranes or skin necrosis: unknown Has patient had a PCN reaction that required hospitalization No Has patient had a PCN reaction occurring within the last 10 years: No If all of the above answers are "NO", then may proceed with Cephalosporin use. Tolerated ceftriaxone 2018 & 2019    Acetaminophen Other (See Comments)    Causes patient to have an upset stomach- Tolerating this in 2023 (325 mg tablets)   Codeine Hives and Itching   Lisinopril Swelling   Ativan [Lorazepam] Other (See Comments)    "Hallucinations," per patient    Current Medications: Current Outpatient Medications  Medication  Sig Dispense Refill   ARIPiprazole (ABILIFY) 2 MG tablet Take 1 tablet (2 mg total) by mouth daily for 8 days, THEN 2 tablets (4 mg total) daily. 172 tablet 0   atorvastatin (LIPITOR) 40 MG tablet TAKE 1 TABLET(40 MG) BY MOUTH DAILY 90 tablet 1   Blood Pressure KIT 1 Application by Does not apply route daily. 1 kit 0   buprenorphine-naloxone (SUBOXONE) 8-2 mg SUBL SL tablet Place 1 tablet under the tongue 3 (three) times daily.     cetirizine (ZYRTEC) 10 MG tablet Take 1 tablet (10 mg total) by mouth daily. (Patient not taking: Reported on 09/07/2023) 30 tablet 11   cloNIDine (CATAPRES) 0.1 MG tablet Take 1 tablet (0.1 mg total) by mouth 2 (two) times daily. 60 tablet 2   clopidogrel (PLAVIX) 75 MG tablet Take 1 tablet (75 mg total) by mouth daily. 30 tablet 2   escitalopram (LEXAPRO) 20 MG tablet Take 1 tablet (20 mg total) by mouth daily. 90 tablet 0   hydrochlorothiazide (HYDRODIURIL) 25 MG tablet Take 1 tablet (25 mg total) by mouth daily. 90 tablet 3   hydrOXYzine (ATARAX) 25 MG tablet Take 1 tablet (25 mg total) by mouth 3 (three) times daily as needed. 102 tablet 1   misoprostol (CYTOTEC) 200 MCG tablet Insert four tablets vaginally at noon the day of your appointment 4 tablet 1   ondansetron (ZOFRAN) 8 MG tablet Take 1 tablet (8 mg total) by mouth every 8 (eight) hours as needed for nausea or vomiting. 20 tablet 2   pantoprazole (PROTONIX) 40 MG tablet Take 1 tablet (40 mg total) by mouth daily. 90 tablet 0   Vitamin D, Ergocalciferol, (DRISDOL) 1.25 MG (50000 UNIT) CAPS capsule Take 1 capsule (50,000 Units total) by mouth every 7 (seven) days. 5 capsule 3   No current facility-administered medications for this visit.    ROS: Review of Systems   Objective:  Psychiatric Specialty Exam: There were no vitals taken for this visit.There is no height or weight on file to calculate BMI.  General Appearance: Fairly Groomed  Eye Contact:  Fair  Speech:  Clear and Coherent and Normal Rate   Volume:  Normal  Mood:  Anxious  Affect:  Congruent and Constricted  Thought Content: Logical   Suicidal Thoughts:  No  Homicidal Thoughts:  No  Thought Process: Primarily coherent but occasionally would become  disorganized  Orientation:  Full (Time, Place, and Person)    Memory: Recent;   Fair  Judgment:  Impaired  Insight:  Shallow  Concentration:  Concentration: Poor  Recall:  not formally assessed   Fund of Knowledge: Fair  Language: Fair  Psychomotor Activity:  Normal  Akathisia:  No  AIMS (if indicated): not done  Assets:  Communication Skills Desire for Improvement Financial Resources/Insurance Leisure Time Physical Health Resilience Social Support Talents/Skills Transportation  ADL's:  Intact  Cognition: WNL  Sleep:  Fair   PE: General: well-appearing; no acute distress  Pulm: no increased work of breathing on room air  Strength & Muscle Tone: within normal limits Neuro: no focal neurological deficits observed  Gait & Station: unsteady  Metabolic Disorder Labs: Lab Results  Component Value Date   HGBA1C 5.4 05/24/2023   MPG 108.28 05/07/2022   MPG 114.02 03/21/2020   No results found for: "PROLACTIN" Lab Results  Component Value Date   CHOL 129 05/07/2022   TRIG 112 05/07/2022   HDL 23 (L) 05/07/2022   CHOLHDL 5.6 05/07/2022   VLDL 22 05/07/2022   LDLCALC 84 05/07/2022   LDLCALC 81 03/21/2020   Lab Results  Component Value Date   TSH 4.100 10/08/2022    Therapeutic Level Labs: No results found for: "LITHIUM" No results found for: "CBMZ" No results found for: "VALPROATE"  Screenings:  AUDIT    Flowsheet Row Office Visit from 04/15/2023 in [facility] Of [facility]  Alcohol Use Disorder Identification Test Final Score (AUDIT) 13       GAD-7    Flowsheet Row Office Visit from 10/07/2023 in [facility] for [facility] [place] Office Visit from 04/15/2023 in [facility] - A Dept Of [facility] Office Visit from 10/08/2022 in [facility] - A Dept Of [facility] Office Visit from 09/23/2016 in [facility] Dept Of [place]  Total GAD-7 Score 11 15 12 11       PHQ2-9    Flowsheet Row Office Visit from 10/07/2023 in [facility] for [facility] [place] Office Visit from 04/15/2023 in [facility] - A Dept Of [facility]ideg[facility] Office Visit from 11/13/2022 in [place] [facility] - A Dept Of [facility]idegroom [facility] Office Visit from 10/08/2022 in [facility] - A Dept Of [facility]idegroom [facility] Office Visit from 09/07/2022 in [facility] - A Dept Of [facility]  PHQ-2 Total Score 2 4 0 6 1  PHQ-9 Total Score 13 17 3 10  --      Flowsheet Row ED from 01/25/2023 in [facility] at [facility] ED to Hosp-Admission (Discharged) from 05/28/2022 in [facility] to Hosp-Admission (Discharged) from 05/12/2022 in [facility]  C-SSRS RISK CATEGORY No Risk No Risk No Risk       Collaboration of Care: Collaboration of Care:   Patient/Guardian was advised Release of Information must be obtained prior to any record release in order to collaborate their care with an outside provider. Patient/Guardian was advised if they have not already done so to contact the registration department to sign all necessary forms in  order for [place] to release information regarding their care.   Consent: Patient/Guardian gives verbal consent for treatment and assignment of benefits for services provided during this visit. Patient/Guardian expressed understanding and agreed to proceed.   Park Pope, MD 3/16/20254:45 PM

## 2023-11-09 ENCOUNTER — Encounter (HOSPITAL_COMMUNITY): Payer: Medicaid Other | Admitting: Student

## 2023-11-24 ENCOUNTER — Ambulatory Visit: Admitting: Obstetrics and Gynecology

## 2023-11-24 ENCOUNTER — Encounter: Payer: Self-pay | Admitting: Nurse Practitioner

## 2023-11-24 ENCOUNTER — Ambulatory Visit (INDEPENDENT_AMBULATORY_CARE_PROVIDER_SITE_OTHER): Payer: Self-pay | Admitting: Nurse Practitioner

## 2023-11-24 VITALS — BP 153/83 | HR 90 | Temp 98.2°F | Wt 173.2 lb

## 2023-11-24 DIAGNOSIS — I10 Essential (primary) hypertension: Secondary | ICD-10-CM | POA: Diagnosis not present

## 2023-11-24 DIAGNOSIS — Z13220 Encounter for screening for lipoid disorders: Secondary | ICD-10-CM

## 2023-11-24 DIAGNOSIS — R5383 Other fatigue: Secondary | ICD-10-CM

## 2023-11-24 MED ORDER — CYANOCOBALAMIN 1000 MCG/ML IJ SOLN
1000.0000 ug | Freq: Once | INTRAMUSCULAR | Status: AC
Start: 2023-11-24 — End: ?

## 2023-11-24 MED ORDER — AZITHROMYCIN 250 MG PO TABS: ORAL_TABLET | ORAL | 0 refills | Status: AC

## 2023-11-24 MED ORDER — MELOXICAM 7.5 MG PO TABS: 7.5000 mg | ORAL_TABLET | Freq: Every day | ORAL | 0 refills | Status: AC

## 2023-11-24 NOTE — Progress Notes (Signed)
 Subjective   Patient ID: [name], female    DOB: Nov 30, 1985, 38 y.o.   MRN: [id number]  Chief Complaint  Patient presents with   Follow-up   Medical Management of Chronic Issues    Patient is here for refills and would like a B12 shot     Referring provider: Jerrlyn Morel, NP  [name] is a 38 y.o. female with Past Medical History: No date: Anemia No date: Anxiety No date: Arthritis No date: Blood transfusion without reported diagnosis No date: Cirrhosis of liver (HCC) 08/04/2018: Cocaine use No date: Depression No date: Dyspnea No date: Gestational diabetes 08/04/2018: History of cocaine use No date: Hypertension No date: Polysubstance abuse (HCC) No date: Pregnancy induced hypertension No date: Renal disorder     Comment:  Kidney Infection  No date: Stroke [facility])     Comment:  x12 No date: Vaginal Pap smear, abnormal   HPI  Patient presents today for a follow-up visit.  She does need labs.  States that she does not need refills today.  She does have fatigue.  She would like a vitamin B12 injection today. Denies f/c/s, n/v/d, hemoptysis, PND, leg swelling Denies chest pain or edema     Allergies  Allergen Reactions   Ibuprofen  Anaphylaxis, Swelling and Other (See Comments)    Patient thinks it may have caused her throat to "swell"  (** PATIENT HAS BEEN TOLERATING THIS IN 2023**)   Latex Shortness Of Breath   Peanut-Containing Drug Products Anaphylaxis   Penicillins Anaphylaxis and Hives    Has patient had a PCN reaction causing immediate rash, facial/tongue/throat swelling, SOB or lightheadedness with hypotension: Yes Has patient had a PCN reaction causing severe rash involving mucus membranes or skin necrosis: unknown Has patient had a PCN reaction that required hospitalization No Has patient had a PCN reaction occurring within the last 10 years: No If all of the above answers are "NO", then may proceed with Cephalosporin  use. Tolerated ceftriaxone  2018 & 2019    Acetaminophen  Other (See Comments)    Causes patient to have an upset stomach- Tolerating this in 2023 (325 mg tablets)   Codeine Hives and Itching   Lisinopril  Swelling   Pantoprazole  Swelling   Ativan  [Lorazepam ] Other (See Comments)    "Hallucinations," per patient    Immunization History  Administered Date(s) Administered   Influenza, Seasonal, Injecte, Preservative Fre 05/24/2023   Influenza,inj,Quad PF,6+ Mos 06/17/2015, 09/23/2016, 08/03/2017, 05/30/2022   Pneumococcal Polysaccharide-23 08/04/2017   Tdap 07/30/2013, 05/08/2016    Tobacco History: Social History   Tobacco Use  Smoking Status Every Day   Current packs/day: 0.50   Types: Cigarettes  Smokeless Tobacco Never   Ready to quit: Yes Counseling given: Yes   Outpatient Encounter Medications as of 11/24/2023  Medication Sig   atorvastatin  (LIPITOR) 40 MG tablet TAKE 1 TABLET(40 MG) BY MOUTH DAILY   [EXPIRED] azithromycin  (ZITHROMAX ) 250 MG tablet Take 2 tablets on day 1, then 1 tablet daily on days 2 through 5   buprenorphine-naloxone  (SUBOXONE) 8-2 mg SUBL SL tablet Place 1 tablet under the tongue 3 (three) times daily.   cloNIDine  (CATAPRES ) 0.1 MG tablet Take 1 tablet (0.1 mg total) by mouth 2 (two) times daily.   clopidogrel  (PLAVIX ) 75 MG tablet Take 1 tablet (75 mg total) by mouth daily.   escitalopram  (LEXAPRO ) 20 MG tablet Take 1 tablet (20 mg total) by mouth daily.   hydrochlorothiazide  (HYDRODIURIL ) 25 MG tablet Take 1 tablet (25 mg  total) by mouth daily.   hydrOXYzine  (ATARAX ) 25 MG tablet Take 1 tablet (25 mg total) by mouth 3 (three) times daily as needed.   meloxicam  (MOBIC ) 7.5 MG tablet Take 1 tablet (7.5 mg total) by mouth daily.   misoprostol  (CYTOTEC ) 200 MCG tablet Insert four tablets vaginally at noon the day of your appointment   ondansetron  (ZOFRAN ) 8 MG tablet Take 1 tablet (8 mg total) by mouth every 8 (eight) hours as needed for nausea or  vomiting.   Vitamin D , Ergocalciferol , (DRISDOL ) 1.25 MG (50000 UNIT) CAPS capsule Take 1 capsule (50,000 Units total) by mouth every 7 (seven) days.   ARIPiprazole  (ABILIFY ) 2 MG tablet Take 1 tablet (2 mg total) by mouth daily for 8 days, THEN 2 tablets (4 mg total) daily. (Patient not taking: Reported on 11/24/2023)   Blood Pressure KIT 1 Application by Does not apply route daily. (Patient not taking: Reported on 11/24/2023)   pantoprazole  (PROTONIX ) 40 MG tablet Take 1 tablet (40 mg total) by mouth daily. (Patient not taking: Reported on 11/24/2023)   [DISCONTINUED] cetirizine  (ZYRTEC ) 10 MG tablet Take 1 tablet (10 mg total) by mouth daily. (Patient not taking: Reported on 09/07/2023)   Facility-Administered Encounter Medications as of 11/24/2023  Medication   cyanocobalamin  (VITAMIN B12) injection 1,000 mcg    Review of Systems  Review of Systems  Constitutional: Negative.   HENT: Negative.    Cardiovascular: Negative.   Gastrointestinal: Negative.   Allergic/Immunologic: Negative.   Neurological: Negative.   Psychiatric/Behavioral: Negative.       Objective:   BP (!) 153/83   Pulse 90   Temp 98.2 F (36.8 C) (Oral)   Wt 173 lb 3.2 oz (78.6 kg)   SpO2 98%   BMI 33.83 kg/m   Wt Readings from Last 5 Encounters:  11/24/23 173 lb 3.2 oz (78.6 kg)  10/07/23 174 lb (78.9 kg)  09/07/23 177 lb (80.3 kg)  05/24/23 173 lb (78.5 kg)  05/17/23 177 lb (80.3 kg)     Physical Exam Vitals and nursing note reviewed.  Constitutional:      General: She is not in acute distress.    Appearance: She is well-developed.  Cardiovascular:     Rate and Rhythm: Normal rate and regular rhythm.  Pulmonary:     Effort: Pulmonary effort is normal.     Breath sounds: Normal breath sounds.  Neurological:     Mental Status: She is alert and oriented to person, place, and time.       Assessment & Plan:   Lipid screening -     Lipid panel  Primary hypertension -     CBC -      Comprehensive metabolic panel with GFR  Other fatigue -     Cyanocobalamin   Other orders -     Meloxicam ; Take 1 tablet (7.5 mg total) by mouth daily.  Dispense: 30 tablet; Refill: 0 -     Azithromycin ; Take 2 tablets on day 1, then 1 tablet daily on days 2 through 5  Dispense: 6 tablet; Refill: 0     Return in about 3 months (around 02/23/2024).   Jerrlyn Morel, NP 12/22/2023

## 2023-11-25 LAB — LIPID PANEL
Chol/HDL Ratio: 4.2 ratio (ref 0.0–4.4)
Cholesterol, Total: 142 mg/dL (ref 100–199)
HDL: 34 mg/dL — ABNORMAL LOW (ref >39)
LDL Chol Calc (NIH): 56 mg/dL (ref 0–99)
Triglycerides: 341 mg/dL — ABNORMAL HIGH (ref 0–149)
VLDL Cholesterol Cal: 52 mg/dL — ABNORMAL HIGH (ref 5–40)

## 2023-11-25 LAB — CBC
Hematocrit: 39.6 % (ref 34.0–46.6)
Hemoglobin: 12.3 g/dL (ref 11.1–15.9)
MCH: 26.3 pg — ABNORMAL LOW (ref 26.6–33.0)
MCHC: 31.1 g/dL — ABNORMAL LOW (ref 31.5–35.7)
MCV: 85 fL (ref 79–97)
Platelets: 324 10*3/uL (ref 150–450)
RBC: 4.67 x10E6/uL (ref 3.77–5.28)
RDW: 15 % (ref 11.7–15.4)
WBC: 8.7 10*3/uL (ref 3.4–10.8)

## 2023-11-25 LAB — COMPREHENSIVE METABOLIC PANEL WITH GFR
ALT: 23 IU/L (ref 0–32)
AST: 35 IU/L (ref 0–40)
Albumin: 4.7 g/dL (ref 3.9–4.9)
Alkaline Phosphatase: 135 IU/L — ABNORMAL HIGH (ref 44–121)
BUN/Creatinine Ratio: 16 (ref 9–23)
BUN: 11 mg/dL (ref 6–20)
Bilirubin Total: 0.2 mg/dL (ref 0.0–1.2)
CO2: 20 mmol/L (ref 20–29)
Calcium: 9.4 mg/dL (ref 8.7–10.2)
Chloride: 101 mmol/L (ref 96–106)
Creatinine, Ser: 0.68 mg/dL (ref 0.57–1.00)
Globulin, Total: 2.6 g/dL (ref 1.5–4.5)
Glucose: 109 mg/dL — ABNORMAL HIGH (ref 70–99)
Potassium: 4.1 mmol/L (ref 3.5–5.2)
Sodium: 139 mmol/L (ref 134–144)
Total Protein: 7.3 g/dL (ref 6.0–8.5)
eGFR: 114 mL/min/{1.73_m2} (ref >59)

## 2023-12-22 ENCOUNTER — Encounter: Payer: Self-pay | Admitting: Nurse Practitioner

## 2023-12-22 NOTE — Patient Instructions (Signed)
 1. Lipid screening (Primary)  - Lipid Panel  2. Primary hypertension  - CBC - Comprehensive metabolic panel with GFR  3. Other fatigue  - cyanocobalamin  (VITAMIN B12) injection 1,000 mcg

## 2024-05-25 ENCOUNTER — Ambulatory Visit (INDEPENDENT_AMBULATORY_CARE_PROVIDER_SITE_OTHER): Payer: Self-pay | Admitting: Nurse Practitioner

## 2024-05-25 ENCOUNTER — Encounter: Payer: Self-pay | Admitting: Nurse Practitioner

## 2024-05-25 VITALS — BP 139/81 | HR 83 | Ht 60.0 in | Wt 175.0 lb

## 2024-05-25 DIAGNOSIS — E559 Vitamin D deficiency, unspecified: Secondary | ICD-10-CM | POA: Diagnosis not present

## 2024-05-25 DIAGNOSIS — F419 Anxiety disorder, unspecified: Secondary | ICD-10-CM

## 2024-05-25 DIAGNOSIS — F411 Generalized anxiety disorder: Secondary | ICD-10-CM

## 2024-05-25 DIAGNOSIS — I639 Cerebral infarction, unspecified: Secondary | ICD-10-CM

## 2024-05-25 DIAGNOSIS — I10 Essential (primary) hypertension: Secondary | ICD-10-CM

## 2024-05-25 DIAGNOSIS — F32A Depression, unspecified: Secondary | ICD-10-CM

## 2024-05-25 DIAGNOSIS — F1491 Cocaine use, unspecified, in remission: Secondary | ICD-10-CM | POA: Insufficient documentation

## 2024-05-25 MED ORDER — CLONIDINE HCL 0.1 MG PO TABS: 0.1000 mg | ORAL_TABLET | Freq: Two times a day (BID) | ORAL | 2 refills | Status: DC

## 2024-05-25 MED ORDER — VITAMIN D (ERGOCALCIFEROL) 1.25 MG (50000 UNIT) PO CAPS: 50000.0000 [IU] | ORAL_CAPSULE | ORAL | 3 refills | Status: AC

## 2024-05-25 MED ORDER — CLONIDINE HCL 0.1 MG PO TABS
0.2000 mg | ORAL_TABLET | Freq: Once | ORAL | Status: AC
  Administered 2024-05-25: 0.2 mg via ORAL

## 2024-05-25 MED ORDER — ESCITALOPRAM OXALATE 20 MG PO TABS: 20.0000 mg | ORAL_TABLET | Freq: Every day | ORAL | 0 refills | Status: DC

## 2024-05-25 MED ORDER — CLOPIDOGREL BISULFATE 75 MG PO TABS: 75.0000 mg | ORAL_TABLET | Freq: Every day | ORAL | 2 refills | Status: DC

## 2024-05-25 MED ORDER — HYDROXYZINE HCL 25 MG PO TABS: 25.0000 mg | ORAL_TABLET | Freq: Three times a day (TID) | ORAL | 1 refills | Status: DC | PRN

## 2024-05-25 MED ORDER — ATORVASTATIN CALCIUM 40 MG PO TABS: ORAL_TABLET | ORAL | 1 refills | Status: AC

## 2024-05-25 MED ORDER — PANTOPRAZOLE SODIUM 40 MG PO TBEC: 40.0000 mg | DELAYED_RELEASE_TABLET | Freq: Every day | ORAL | 0 refills | Status: DC

## 2024-05-25 MED ORDER — HYDROCHLOROTHIAZIDE 25 MG PO TABS: 25.0000 mg | ORAL_TABLET | Freq: Every day | ORAL | 3 refills | Status: DC

## 2024-05-25 NOTE — Progress Notes (Signed)
 Subjective   Patient ID: [name], female    DOB: 05/19/1986, 38 y.o.   MRN: [id number]  Chief Complaint  Patient presents with   Medication Refill    Referring provider: Oley Bascom RAMAN, NP  [name] is a 38 y.o. female with Past Medical History: No date: Anemia No date: Anxiety No date: Arthritis No date: Blood transfusion without reported diagnosis No date: Cirrhosis of liver (HCC) 08/04/2018: Cocaine use No date: Depression No date: Dyspnea No date: Gestational diabetes 08/04/2018: History of cocaine use No date: Hypertension No date: Polysubstance abuse (HCC) No date: Pregnancy induced hypertension No date: Renal disorder     Comment:  Kidney Infection  No date: Stroke [facility])     Comment:  x12 No date: Vaginal Pap smear, abnormal   HPI  Patient presents today for follow-up visit.  She is out of her medications and does need refills.  Blood pressure is elevated in office today.  2 clonidine  were given in office today.  Patient states he states she does need a referral back to neurology for history of stroke.  Patient states that she is no longer using drugs and is not drinking a lot of alcohol  these days.  She declines rehab resources for alcohol  use today.  Denies f/c/s, n/v/d, hemoptysis, PND, leg swelling. Denies chest pain or edema.     Allergies  Allergen Reactions   Ibuprofen  Anaphylaxis, Swelling and Other (See Comments)    Patient thinks it may have caused her throat to swell  (** PATIENT HAS BEEN TOLERATING THIS IN 2023**)   Latex Shortness Of Breath   Peanut-Containing Drug Products Anaphylaxis   Penicillins Anaphylaxis and Hives    Has patient had a PCN reaction causing immediate rash, facial/tongue/throat swelling, SOB or lightheadedness with hypotension: Yes Has patient had a PCN reaction causing severe rash involving mucus membranes or skin necrosis: unknown Has patient had a PCN reaction that required hospitalization No Has  patient had a PCN reaction occurring within the last 10 years: No If all of the above answers are NO, then may proceed with Cephalosporin use. Tolerated ceftriaxone  2018 & 2019    Acetaminophen  Other (See Comments)    Causes patient to have an upset stomach- Tolerating this in 2023 (325 mg tablets)   Codeine Hives and Itching   Lisinopril  Swelling   Pantoprazole  Swelling   Ativan  [Lorazepam ] Other (See Comments)    Hallucinations, per patient    Immunization History  Administered Date(s) Administered   Influenza, Seasonal, Injecte, Preservative Fre 05/24/2023   Influenza,inj,Quad PF,6+ Mos 06/17/2015, 09/23/2016, 08/03/2017, 05/30/2022   Pneumococcal Polysaccharide-23 08/04/2017   Tdap 07/30/2013, 05/08/2016    Tobacco History: Social History   Tobacco Use  Smoking Status Every Day   Current packs/day: 0.50   Types: Cigarettes  Smokeless Tobacco Never   Ready to quit: No Counseling given: Yes   Outpatient Encounter Medications as of 05/25/2024  Medication Sig   ARIPiprazole  (ABILIFY ) 2 MG tablet Take 1 tablet (2 mg total) by mouth daily for 8 days, THEN 2 tablets (4 mg total) daily.   buprenorphine-naloxone  (SUBOXONE) 8-2 mg SUBL SL tablet Place 1 tablet under the tongue 3 (three) times daily.   DENTA 5000 PLUS 1.1 % CREA dental cream Take by mouth daily.   gabapentin  (NEURONTIN ) 300 MG capsule Take 300 mg by mouth 3 (three) times daily.   KLOXXADO  8 MG/0.1ML LIQD Place into both nostrils.   meloxicam  (MOBIC ) 7.5 MG tablet Take 1 tablet (  7.5 mg total) by mouth daily.   Prenatal Vit-Fe Fumarate-FA (M-NATAL PLUS) 27-1 MG TABS Take 1 tablet by mouth daily.   [DISCONTINUED] atorvastatin  (LIPITOR) 40 MG tablet TAKE 1 TABLET(40 MG) BY MOUTH DAILY   [DISCONTINUED] cloNIDine  (CATAPRES ) 0.1 MG tablet Take 1 tablet (0.1 mg total) by mouth 2 (two) times daily.   [DISCONTINUED] clopidogrel  (PLAVIX ) 75 MG tablet Take 1 tablet (75 mg total) by mouth daily.   [DISCONTINUED]  escitalopram  (LEXAPRO ) 20 MG tablet Take 1 tablet (20 mg total) by mouth daily.   [DISCONTINUED] hydrochlorothiazide  (HYDRODIURIL ) 25 MG tablet Take 1 tablet (25 mg total) by mouth daily.   [DISCONTINUED] hydrOXYzine  (ATARAX ) 25 MG tablet Take 1 tablet (25 mg total) by mouth 3 (three) times daily as needed.   [DISCONTINUED] pantoprazole  (PROTONIX ) 40 MG tablet Take 1 tablet (40 mg total) by mouth daily.   [DISCONTINUED] Vitamin D , Ergocalciferol , (DRISDOL ) 1.25 MG (50000 UNIT) CAPS capsule Take 1 capsule (50,000 Units total) by mouth every 7 (seven) days.   atorvastatin  (LIPITOR) 40 MG tablet TAKE 1 TABLET(40 MG) BY MOUTH DAILY   Blood Pressure KIT 1 Application by Does not apply route daily. (Patient not taking: Reported on 11/24/2023)   cloNIDine  (CATAPRES ) 0.1 MG tablet Take 1 tablet (0.1 mg total) by mouth 2 (two) times daily.   clopidogrel  (PLAVIX ) 75 MG tablet Take 1 tablet (75 mg total) by mouth daily.   escitalopram  (LEXAPRO ) 20 MG tablet Take 1 tablet (20 mg total) by mouth daily.   hydrochlorothiazide  (HYDRODIURIL ) 25 MG tablet Take 1 tablet (25 mg total) by mouth daily.   hydrOXYzine  (ATARAX ) 25 MG tablet Take 1 tablet (25 mg total) by mouth 3 (three) times daily as needed.   misoprostol  (CYTOTEC ) 200 MCG tablet Insert four tablets vaginally at noon the day of your appointment   pantoprazole  (PROTONIX ) 40 MG tablet Take 1 tablet (40 mg total) by mouth daily.   Vitamin D , Ergocalciferol , (DRISDOL ) 1.25 MG (50000 UNIT) CAPS capsule Take 1 capsule (50,000 Units total) by mouth every 7 (seven) days.   [DISCONTINUED] ondansetron  (ZOFRAN ) 8 MG tablet Take 1 tablet (8 mg total) by mouth every 8 (eight) hours as needed for nausea or vomiting.   Facility-Administered Encounter Medications as of 05/25/2024  Medication   [COMPLETED] cloNIDine  (CATAPRES ) tablet 0.2 mg   cyanocobalamin  (VITAMIN B12) injection 1,000 mcg    Review of Systems  Review of Systems  Constitutional: Negative.   HENT:  Negative.    Cardiovascular: Negative.   Gastrointestinal: Negative.   Allergic/Immunologic: Negative.   Neurological: Negative.   Psychiatric/Behavioral: Negative.       Objective:   BP (!) 189/109   Pulse 83   Ht 5' (1.524 m)   Wt 175 lb (79.4 kg)   LMP 05/16/2024 (Approximate)   SpO2 97%   BMI 34.18 kg/m   Wt Readings from Last 5 Encounters:  05/25/24 175 lb (79.4 kg)  11/24/23 173 lb 3.2 oz (78.6 kg)  10/07/23 174 lb (78.9 kg)  09/07/23 177 lb (80.3 kg)  05/24/23 173 lb (78.5 kg)     Physical Exam Vitals and nursing note reviewed.  Constitutional:      General: She is not in acute distress.    Appearance: She is well-developed.  Cardiovascular:     Rate and Rhythm: Normal rate and regular rhythm.  Pulmonary:     Effort: Pulmonary effort is normal.     Breath sounds: Normal breath sounds.  Neurological:     Mental Status:  She is alert and oriented to person, place, and time.       Assessment & Plan:   Anxiety and depression -     Escitalopram  Oxalate; Take 1 tablet (20 mg total) by mouth daily.  Dispense: 90 tablet; Refill: 0  Primary hypertension -     hydroCHLOROthiazide ; Take 1 tablet (25 mg total) by mouth daily.  Dispense: 90 tablet; Refill: 3 -     CBC -     Comprehensive metabolic panel with GFR -     cloNIDine  HCl  GAD (generalized anxiety disorder) -     hydrOXYzine  HCl; Take 1 tablet (25 mg total) by mouth 3 (three) times daily as needed.  Dispense: 102 tablet; Refill: 1  Vitamin D  deficiency -     Vitamin D  (Ergocalciferol ); Take 1 capsule (50,000 Units total) by mouth every 7 (seven) days.  Dispense: 5 capsule; Refill: 3  Acute CVA (cerebrovascular accident) (HCC) -     Ambulatory referral to Neurology  Other orders -     Atorvastatin  Calcium ; TAKE 1 TABLET(40 MG) BY MOUTH DAILY  Dispense: 90 tablet; Refill: 1 -     cloNIDine  HCl; Take 1 tablet (0.1 mg total) by mouth 2 (two) times daily.  Dispense: 60 tablet; Refill: 2 -      Clopidogrel  Bisulfate; Take 1 tablet (75 mg total) by mouth daily.  Dispense: 30 tablet; Refill: 2 -     Pantoprazole  Sodium; Take 1 tablet (40 mg total) by mouth daily.  Dispense: 90 tablet; Refill: 0     Return in about 3 months (around 08/25/2024).   Bascom GORMAN Borer, NP 05/25/2024

## 2024-05-26 ENCOUNTER — Ambulatory Visit: Payer: Self-pay | Admitting: Nurse Practitioner

## 2024-05-26 LAB — CBC
Hematocrit: 38.9 % (ref 34.0–46.6)
Hemoglobin: 12.3 g/dL (ref 11.1–15.9)
MCH: 26.2 pg — ABNORMAL LOW (ref 26.6–33.0)
MCHC: 31.6 g/dL (ref 31.5–35.7)
MCV: 83 fL (ref 79–97)
Platelets: 296 x10E3/uL (ref 150–450)
RBC: 4.7 x10E6/uL (ref 3.77–5.28)
RDW: 16.2 % — ABNORMAL HIGH (ref 11.7–15.4)
WBC: 9.8 x10E3/uL (ref 3.4–10.8)

## 2024-05-26 LAB — COMPREHENSIVE METABOLIC PANEL WITH GFR
ALT: 16 IU/L (ref 0–32)
AST: 18 IU/L (ref 0–40)
Albumin: 4.3 g/dL (ref 3.9–4.9)
Alkaline Phosphatase: 139 IU/L — ABNORMAL HIGH (ref 41–116)
BUN/Creatinine Ratio: 18 (ref 9–23)
BUN: 13 mg/dL (ref 6–24)
Bilirubin Total: 0.3 mg/dL (ref 0.0–1.2)
CO2: 23 mmol/L (ref 20–29)
Calcium: 9.4 mg/dL (ref 8.7–10.2)
Chloride: 101 mmol/L (ref 96–106)
Creatinine, Ser: 0.72 mg/dL (ref 0.57–1.00)
Globulin, Total: 2.6 g/dL (ref 1.5–4.5)
Glucose: 104 mg/dL — ABNORMAL HIGH (ref 70–99)
Potassium: 4.5 mmol/L (ref 3.5–5.2)
Sodium: 137 mmol/L (ref 134–144)
Total Protein: 6.9 g/dL (ref 6.0–8.5)
eGFR: 108 mL/min/1.73 (ref >59)

## 2024-06-01 ENCOUNTER — Telehealth: Payer: Self-pay | Admitting: Neurology

## 2024-06-01 NOTE — Telephone Encounter (Signed)
 Pt and mother Ellouise ( [place]) came into office. States they can get no help for pt and are wanting a letter stating the pt is unable to work bc of stroke and condition. Pt was unable to get scheduled with Pt and OT, states they just would not schedule pt and are not sure of the reason. Pt condition has gotten worse and mother is concerned pt maybe has had another stroke.  Would like a call Monday or at the beginning of the week if it is possible for them to get this letter.  Call mother Ellouise: [phone]

## 2024-06-06 NOTE — Telephone Encounter (Signed)
 [name], can you call patient and see what is going on? Was last seen in January 2025 by me, plan to return PRN, has seen PCP recently. Kindly ask her to discuss note with PCP if needed. Her stroke was in Sept 2023. Any acute stroke symptoms should go to the ER. In her last office visit with PCP she was out of BP medication, non-compliant.

## 2024-06-06 NOTE — Telephone Encounter (Addendum)
 Mom returned call and states that patient needed a letter stating she couldn't work. I advised we don't provide that documentation and she would need to file for disability through DSS. She stated that she was denied and is in the appeals process. I advised that she could reach out to her PCP and she stated that she will do that because they were just there. I also advised for any new acute stroke symptoms that she should go to the ER for evaluation. Mom requested that  records from our office. Advised I would have medical records send those. Mom and patient verbalized understanding and appreciative. I reviewed and provided education on vascular risk factors

## 2024-07-03 ENCOUNTER — Ambulatory Visit: Payer: Self-pay | Admitting: Nurse Practitioner

## 2024-08-25 ENCOUNTER — Ambulatory Visit: Payer: Self-pay | Admitting: Nurse Practitioner

## 2024-08-25 VITALS — BP 184/100 | HR 98 | Temp 97.1°F | Wt 179.2 lb

## 2024-08-25 DIAGNOSIS — R52 Pain, unspecified: Secondary | ICD-10-CM

## 2024-08-25 DIAGNOSIS — G4719 Other hypersomnia: Secondary | ICD-10-CM

## 2024-08-25 DIAGNOSIS — F32A Depression, unspecified: Secondary | ICD-10-CM

## 2024-08-25 DIAGNOSIS — F418 Other specified anxiety disorders: Secondary | ICD-10-CM

## 2024-08-25 DIAGNOSIS — R4586 Emotional lability: Secondary | ICD-10-CM

## 2024-08-25 DIAGNOSIS — I10 Essential (primary) hypertension: Secondary | ICD-10-CM

## 2024-08-25 DIAGNOSIS — E669 Obesity, unspecified: Secondary | ICD-10-CM

## 2024-08-25 MED ORDER — HYDROCHLOROTHIAZIDE 25 MG PO TABS: 25.0000 mg | ORAL_TABLET | Freq: Every day | ORAL | 3 refills | Status: AC

## 2024-08-25 MED ORDER — PANTOPRAZOLE SODIUM 40 MG PO TBEC: 40.0000 mg | DELAYED_RELEASE_TABLET | Freq: Every day | ORAL | 0 refills | Status: AC

## 2024-08-25 MED ORDER — AMLODIPINE BESYLATE 5 MG PO TABS: 5.0000 mg | ORAL_TABLET | Freq: Every day | ORAL | 2 refills | Status: AC

## 2024-08-25 MED ORDER — METHYLPREDNISOLONE SODIUM SUCC 40 MG IJ SOLR
40.0000 mg | Freq: Once | INTRAMUSCULAR | Status: AC
  Administered 2024-08-25: 40 mg via INTRAMUSCULAR

## 2024-08-25 MED ORDER — CLOPIDOGREL BISULFATE 75 MG PO TABS: 75.0000 mg | ORAL_TABLET | Freq: Every day | ORAL | 2 refills | Status: AC

## 2024-08-25 MED ORDER — ESCITALOPRAM OXALATE 20 MG PO TABS: 20.0000 mg | ORAL_TABLET | Freq: Every day | ORAL | 0 refills | Status: AC

## 2024-08-25 MED ORDER — OZEMPIC (0.25 OR 0.5 MG/DOSE) 2 MG/3ML ~~LOC~~ SOPN: 0.2500 mg | PEN_INJECTOR | SUBCUTANEOUS | 2 refills | Status: DC
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
## 2024-08-26 LAB — CBC
Hematocrit: 36.7 % (ref 34.0–46.6)
Hemoglobin: 11.9 g/dL (ref 11.1–15.9)
MCH: 27.9 pg (ref 26.6–33.0)
MCHC: 32.4 g/dL (ref 31.5–35.7)
MCV: 86 fL (ref 79–97)
Platelets: 249 x10E3/uL (ref 150–450)
RBC: 4.26 x10E6/uL (ref 3.77–5.28)
RDW: 17.2 % — ABNORMAL HIGH (ref 11.7–15.4)
WBC: 6.7 x10E3/uL (ref 3.4–10.8)

## 2024-08-26 LAB — COMPREHENSIVE METABOLIC PANEL WITH GFR
ALT: 35 IU/L — ABNORMAL HIGH (ref 0–32)
AST: 43 IU/L — ABNORMAL HIGH (ref 0–40)
Albumin: 4.4 g/dL (ref 3.9–4.9)
Alkaline Phosphatase: 102 IU/L (ref 41–116)
BUN/Creatinine Ratio: 14 (ref 9–23)
BUN: 11 mg/dL (ref 6–24)
Bilirubin Total: 0.4 mg/dL (ref 0.0–1.2)
CO2: 20 mmol/L (ref 20–29)
Calcium: 9.3 mg/dL (ref 8.7–10.2)
Chloride: 104 mmol/L (ref 96–106)
Creatinine, Ser: 0.76 mg/dL (ref 0.57–1.00)
Globulin, Total: 2.5 g/dL (ref 1.5–4.5)
Glucose: 95 mg/dL (ref 70–99)
Potassium: 3.5 mmol/L (ref 3.5–5.2)
Sodium: 143 mmol/L (ref 134–144)
Total Protein: 6.9 g/dL (ref 6.0–8.5)
eGFR: 102 mL/min/1.73

## 2024-08-28 ENCOUNTER — Ambulatory Visit: Payer: Self-pay | Admitting: Nurse Practitioner

## 2024-08-28 LAB — TOXASSURE FLEX 15, UR
6-ACETYLMORPHINE IA: NEGATIVE ng/mL
7-aminoclonazepam: NOT DETECTED ng/mg{creat}
AMPHETAMINES IA: NEGATIVE ng/mL
Alpha-hydroxyalprazolam: NOT DETECTED ng/mg{creat}
Alpha-hydroxymidazolam: NOT DETECTED ng/mg{creat}
Alpha-hydroxytriazolam: NOT DETECTED ng/mg{creat}
Alprazolam: NOT DETECTED ng/mg{creat}
BARBITURATES IA: NEGATIVE ng/mL
BUPRENORPHINE: POSITIVE
Benzodiazepines: NEGATIVE
Buprenorphine: 42 ng/mg{creat}
CANNABINOIDS IA: NEGATIVE ng/mL
Clonazepam: NOT DETECTED ng/mg{creat}
Creatinine: 92 mg/dL
Desalkylflurazepam: NOT DETECTED ng/mg{creat}
Desmethyldiazepam: NOT DETECTED ng/mg{creat}
Desmethylflunitrazepam: NOT DETECTED ng/mg{creat}
Diazepam: NOT DETECTED ng/mg{creat}
ETHYL ALCOHOL Enzymatic: POSITIVE g/dL
FENTANYL: NEGATIVE
Fentanyl: NOT DETECTED ng/mg{creat}
Flunitrazepam: NOT DETECTED ng/mg{creat}
Lorazepam: NOT DETECTED ng/mg{creat}
METHADONE IA: NEGATIVE ng/mL
METHADONE MTB IA: NEGATIVE ng/mL
Midazolam: NOT DETECTED ng/mg{creat}
Norbuprenorphine: 117 ng/mg{creat}
Norfentanyl: NOT DETECTED ng/mg{creat}
OPIATE CLASS IA: NEGATIVE ng/mL
OXYCODONE CLASS IA: NEGATIVE ng/mL
Oxazepam: NOT DETECTED ng/mg{creat}
PHENCYCLIDINE IA: NEGATIVE ng/mL
TAPENTADOL, IA: NEGATIVE ng/mL
TRAMADOL IA: NEGATIVE ng/mL
Temazepam: NOT DETECTED ng/mg{creat}

## 2024-08-28 LAB — ALCOHOL, ETHYL, UR, QT
Alcohol, Ethyl: 0.091 g/dL
Alcohol, Ethyl: POSITIVE

## 2024-08-28 LAB — COCAINE AND MTB, MS, UR RFX
Benzoylecgonine: >5435 ng/mg{creat}
Cocaethylene: 2317 ng/mg{creat}
Cocaine Confirmation: POSITIVE
Cocaine: 963 ng/mg{creat}

## 2024-08-29 ENCOUNTER — Other Ambulatory Visit: Payer: Self-pay

## 2024-08-30 ENCOUNTER — Other Ambulatory Visit: Payer: Self-pay

## 2024-08-31 ENCOUNTER — Telehealth: Payer: Self-pay

## 2024-08-31 ENCOUNTER — Other Ambulatory Visit: Payer: Self-pay

## 2024-08-31 ENCOUNTER — Encounter: Payer: Self-pay | Admitting: Neurology

## 2024-08-31 ENCOUNTER — Ambulatory Visit: Admitting: Neurology

## 2024-08-31 VITALS — BP 144/91 | HR 97 | Resp 15 | Ht 60.0 in | Wt 178.5 lb

## 2024-08-31 DIAGNOSIS — Z720 Tobacco use: Secondary | ICD-10-CM | POA: Diagnosis not present

## 2024-08-31 DIAGNOSIS — F1491 Cocaine use, unspecified, in remission: Secondary | ICD-10-CM | POA: Diagnosis not present

## 2024-08-31 DIAGNOSIS — E785 Hyperlipidemia, unspecified: Secondary | ICD-10-CM | POA: Diagnosis not present

## 2024-08-31 DIAGNOSIS — I10 Essential (primary) hypertension: Secondary | ICD-10-CM

## 2024-08-31 DIAGNOSIS — I6381 Other cerebral infarction due to occlusion or stenosis of small artery: Secondary | ICD-10-CM

## 2024-08-31 DIAGNOSIS — Z8673 Personal history of transient ischemic attack (TIA), and cerebral infarction without residual deficits: Secondary | ICD-10-CM

## 2024-08-31 DIAGNOSIS — I639 Cerebral infarction, unspecified: Secondary | ICD-10-CM

## 2024-08-31 DIAGNOSIS — F419 Anxiety disorder, unspecified: Secondary | ICD-10-CM | POA: Diagnosis not present

## 2024-08-31 NOTE — Patient Instructions (Signed)
 Check MRI of the brain Check home sleep study to screen for apnea Recommend against smoking, drug use, alcohol  Strict management of vascular risk factors with a goal BP less than 130/90, A1c less than 7.0, LDL less than 70 for secondary stroke prevention

## 2024-08-31 NOTE — Telephone Encounter (Signed)
 Copied from CRM #[id number]. Topic: Clinical - Medication Prior Auth >> Aug 31, 2024 10:44 AM Hadassah PARAS wrote: Reason for CRM: Constantly gainging weight, pt will be having a knee replacement due to heavy weight on knees. Pt does not each much but is constantly gaining weight. Pt's daughter is calling to req a PA for Semaglutide ,0.25 or 0.5MG /DOS, (OZEMPIC , 0.25 OR 0.5 MG/DOSE,) 2 MG/3ML SOPN due to insurance needing verification. Please advise pt on # [phone]

## 2024-08-31 NOTE — Progress Notes (Signed)
 "  Patient: [name] Date of Birth: 08-09-1986  Reason for Visit: Follow up History from: Patient, mother Primary Neurologist: [name]  ASSESSMENT AND PLAN 39 y.o. 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  Vascular risk factors of hypertension, hyperlipidemia, polysubstance abuse, cocaine , alcoholism and smoking. Concern for stroke like episodes over the last year (slurred speech, right sided weakness, gait imbalance).  Complains of daytime somnolence. Of note, recent drug screen with PCP was positive for cocaine , ETOH.  - Check MRI of the brain to rule out recurrent stroke - HST to screen for sleep apnea, ESS 18 - Continue Plavix  75 mg daily for secondary stroke prevention - Strict management of vascular risk factors with a goal BP less than 130/90, A1c less than 7.0, LDL less than 70 for secondary stroke prevention - Recommend against smoking, drug use, alcohol  use - Return here as needed  HISTORY OF PRESENT ILLNESS: Today 08/31/2024 08/31/24 SS: Patient here with her mother. From CVA deficits, trouble with finding the right words to retrieve, slurry speech, right sided weakness, trouble with balance. Thinks has a few events in the past year concerning for another stroke. 3 spells in the last year, slurry speech, off balance, can last close to a week. She lives with her mom, she can't stay awake, sleep 23 hours a day. [place]. She snores. Having right knee replacement this month. Remains on Plavix . She is gaining weight. BP 144/91, was originally high but they walked here from social services. Takes clonidine , Norvasc , hydrochlorothiazide . Lipid panel April LDL 56. Trying to get started on Ozempic . Drinking few beets a week. Smokes a pack every 3 days with her mother. Denies drug use. ESS 18.  09/07/23 SS: Saw Dr.  Rosemarie is Jan 2024. Recommended continue Plavix  75 mg daily. Referral to PT/OT. Check arterial Doppler bubble study for PFO (was negative). Doesn't drive. Denies any drug use, smokes 1-2 packs a week, 2-3 40 oz beers a week. Remains on Plavix  75 mg daily. Didn't do any PT/OT that was ordered. Transportation is difficult, she lives with her mom. Is trying to get disability. Post CVA has difficulty concentrating and focusing, has chronic left arm and leg weakness from CVA, right sided leg weakness from knee surgery. Is out of most medications, PCP refilled medications yesterday. Is on Suboxone. Has a lot of stressors, anxiety, is crying today.   HISTORY  09/14/22 Dr. Rosemarie HPI: [name] is a 39 year old Caucasian lady with past medical history of hypertension, hyperlipidemia, anxiety, cirrhosis, polysubstance abuse, cocaine  abuse, smoking who is seen today for initial office consultation visit.  She is accompanied by her mother.  History is obtained from her and review of electronic medical records.  I have personally reviewed pertinent available imaging films in PACS.  She initially presented on 03/21/2020 1 to 2-week history of numbness in the left hand in the right hand.  MRI showed multiple bilateral thalamic,left corona radiata/internal capsule lacunar infarcts.  Subacute.  He was hypertensive reported abusing cocaine  off and on.  She left AGAINST MEDICAL ADVICE prior to completing stroke workup.  She returned on 05/06/2022.  Patient was admitted with abdominal pain suspected abdominal abscess and was on antibiotics awaiting surgery He was found to have right-sided weakness.  MRI scan of the spine showed right pontine, left putamen, right thalamus, corpus callosum and left frontal white matter  infarcts.  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  Echocardiogram showed ejection fraction of 60 to 65% without clot.  Hemoglobin A1c was 5.4 and LDL cholesterol was 84 mg percent.  RPR and HIV were negative.  He  had known history of noncompliance with medications and follow-up.  She was started on aspirin  and Plavix  and statin for lipid.  States is done well since abnormal recurrent stroke or TIA symptoms.  She does feel that her walking is not back to baseline.  Also feels right side is weak and sometimes the right leg is tricky and at times gives away for no reason.  She does walk independently without a cane.  She does not have health insurance she did not seek medical follow-up but now states has since gotten health insurance.  REVIEW OF SYSTEMS: Out of a complete 14 system review of symptoms, the patient complains only of the following symptoms, and all other reviewed systems are negative.  See HPI  ALLERGIES: Allergies  Allergen Reactions   Ibuprofen  Anaphylaxis, Swelling and Other (See Comments)    Patient thinks it may have caused her throat to swell  (** PATIENT HAS BEEN TOLERATING THIS IN 2023**)   Latex Shortness Of Breath   Peanut-Containing Drug Products Anaphylaxis   Penicillins Anaphylaxis and Hives    Has patient had a PCN reaction causing immediate rash, facial/tongue/throat swelling, SOB or lightheadedness with hypotension: Yes Has patient had a PCN reaction causing severe rash involving mucus membranes or skin necrosis: unknown Has patient had a PCN reaction that required hospitalization No Has patient had a PCN reaction occurring within the last 10 years: No If all of the above answers are NO, then may proceed with Cephalosporin use. Tolerated ceftriaxone  2018 & 2019    Acetaminophen  Other (See Comments)    Causes patient to have an upset stomach- Tolerating this in 2023 (325 mg tablets)   Codeine Hives and Itching   Lisinopril  Swelling   Pantoprazole  Swelling   Ativan  [Lorazepam ] Other (See Comments)    Hallucinations, per patient    HOME MEDICATIONS: Outpatient Medications Prior to Visit  Medication Sig Dispense Refill   amLODipine  (NORVASC ) 5 MG tablet Take 1  tablet (5 mg total) by mouth daily. 90 tablet 2   ARIPiprazole  (ABILIFY ) 2 MG tablet Take 1 tablet (2 mg total) by mouth daily for 8 days, THEN 2 tablets (4 mg total) daily. 172 tablet 0   atorvastatin  (LIPITOR) 40 MG tablet TAKE 1 TABLET(40 MG) BY MOUTH DAILY 90 tablet 1   Blood Pressure KIT 1 Application by Does not apply route daily. 1 kit 0   buprenorphine-naloxone  (SUBOXONE) 8-2 mg SUBL SL tablet Place 1 tablet under the tongue 3 (three) times daily.     cloNIDine  (CATAPRES ) 0.1 MG tablet Take 1 tablet (0.1 mg total) by mouth 2 (two) times daily. 60 tablet 2   clopidogrel  (PLAVIX ) 75 MG tablet Take 1 tablet (75 mg total) by mouth daily. 30 tablet 2   DENTA 5000 PLUS 1.1 % CREA dental cream Take by mouth daily.     escitalopram  (LEXAPRO ) 20 MG tablet Take 1 tablet (20 mg total) by mouth daily. 90 tablet 0   gabapentin  (NEURONTIN ) 300 MG capsule Take 300 mg by mouth 3 (three) times daily.     hydrochlorothiazide  (HYDRODIURIL ) 25 MG tablet Take 1 tablet (25 mg total) by mouth daily. 90 tablet 3   hydrOXYzine  (ATARAX ) 25 MG tablet Take 1 tablet (25 mg total) by mouth 3 (three)  times daily as needed. 102 tablet 1   KLOXXADO  8 MG/0.1ML LIQD Place into both nostrils.     meloxicam  (MOBIC ) 7.5 MG tablet Take 1 tablet (7.5 mg total) by mouth daily. 30 tablet 0   misoprostol  (CYTOTEC ) 200 MCG tablet Insert four tablets vaginally at noon the day of your appointment 4 tablet 1   pantoprazole  (PROTONIX ) 40 MG tablet Take 1 tablet (40 mg total) by mouth daily. 90 tablet 0   Prenatal Vit-Fe Fumarate-FA (M-NATAL PLUS) 27-1 MG TABS Take 1 tablet by mouth daily.     Semaglutide ,0.25 or 0.5MG /DOS, (OZEMPIC , 0.25 OR 0.5 MG/DOSE,) 2 MG/3ML SOPN Inject 0.25 mg into the skin once a week. 3 mL 2   Vitamin D , Ergocalciferol , (DRISDOL ) 1.25 MG (50000 UNIT) CAPS capsule Take 1 capsule (50,000 Units total) by mouth every 7 (seven) days. 5 capsule 3   Facility-Administered Medications Prior to Visit  Medication Dose  Route Frequency Provider Last Rate Last Admin   cyanocobalamin  (VITAMIN B12) injection 1,000 mcg  1,000 mcg Intramuscular Once         PAST MEDICAL HISTORY: Past Medical History:  Diagnosis Date   Anemia    Anxiety    Arthritis    Blood transfusion without reported diagnosis    Cirrhosis of liver (HCC)    Cocaine  use 08/04/2018   Depression    Dyspnea    Gestational diabetes    History of cocaine  use 08/04/2018   Hypertension    Polysubstance abuse (HCC)    Pregnancy induced hypertension    Renal disorder    Kidney Infection    Stroke (HCC)    x12   Vaginal Pap smear, abnormal     PAST SURGICAL HISTORY: Past Surgical History:  Procedure Laterality Date   ARTERY REPAIR     CESAREAN SECTION     x 5   MOUTH SURGERY     TEAR DUCT PROBING     TONSILLECTOMY     TUBAL LIGATION     VENTRAL HERNIA REPAIR N/A 10/03/2016   Procedure: OPEN INCARCERATED HERNIA REPAIR VENTRAL ADULT;  Surgeon: Lynda Leos, MD;  Location: [facility] OR;  Service: General;  Laterality: N/A;    FAMILY HISTORY: Family History  Problem Relation Age of Onset   Cancer Mother    Aortic stenosis Mother    Stroke Father    Hyperlipidemia Father    Hypertension Father    Alpha-1 antitrypsin deficiency Brother    Heart disease Brother    Cancer Maternal Aunt    Cancer Paternal Aunt    CAD Other    Diabetes Other    Hypertension Other    Cancer Other    Thyroid  disease Other     SOCIAL HISTORY: Social History   Socioeconomic History   Marital status: Single    Spouse name: Not on file   Number of children: 4   Years of education: Not on file   Highest education level: GED or equivalent  Occupational History   Not on file  Tobacco Use   Smoking status: Every Day    Current packs/day: 0.50    Types: Cigarettes   Smokeless tobacco: Never  Vaping Use   Vaping status: Never Used  Substance and Sexual Activity   Alcohol  use: Yes    Comment: daily   Drug use: Not Currently    Types: Cocaine     Sexual activity: Not Currently    Birth control/protection: Surgical  Other Topics Concern   Not on file  Social History Narrative   Not on file   Social Drivers of Health   Tobacco Use: High Risk (08/31/2024)   Patient History    Smoking Tobacco Use: Every Day    Smokeless Tobacco Use: Never    Passive Exposure: Not on file  Financial Resource Strain: High Risk (08/25/2024)   Overall Financial Resource Strain (CARDIA)    Difficulty of Paying Living Expenses: Very hard  Food Insecurity: Food Insecurity Present (08/25/2024)   Epic    Worried About Programme Researcher, Broadcasting/film/video in the Last Year: Often true    Barista in the Last Year: Often true  Transportation Needs: Unmet Transportation Needs (08/25/2024)   Epic    Lack of Transportation (Medical): Yes    Lack of Transportation (Non-Medical): Yes  Physical Activity: Not on file  Stress: Not on file  Social Connections: Unknown (08/25/2024)   Social Connection and Isolation Panel    Frequency of Communication with Friends and Family: Twice a week    Frequency of Social Gatherings with Friends and Family: Once a week    Attends Religious Services: Never    Database Administrator or Organizations: Not on file    Attends Banker Meetings: Not on file    Marital Status: Never married  Intimate Partner Violence: Not At Risk (08/25/2024)   Epic    Fear of Current or Ex-Partner: No    Emotionally Abused: No    Physically Abused: No    Sexually Abused: No  Depression (PHQ2-9): High Risk (08/25/2024)   Depression (PHQ2-9)    PHQ-2 Score: 13  Alcohol  Screen: Low Risk (08/25/2024)   Alcohol  Screen    Last Alcohol  Screening Score (AUDIT): 2  Housing: High Risk (08/25/2024)   Epic    Unable to Pay for Housing in the Last Year: Patient declined    Number of Times Moved in the Last Year: Not on file    Homeless in the Last Year: Yes  Utilities: Not At Risk (11/13/2022)   [facility] Utilities    Threatened with loss of utilities: No  Health  Literacy: Not on file   PHYSICAL EXAM  Vitals:   08/31/24 1416 08/31/24 1421  BP: (!) 166/107 (!) 144/91  Pulse: (!) 115 97  Resp: 15   SpO2: 96% 94%  Weight: 178 lb 8 oz (81 kg)   Height: 5' (1.524 m)    Body mass index is 34.86 kg/m.  Generalized: Well developed, in no acute distress  Neurological examination  Mentation: Alert oriented to time, place, history taking. Follows all commands speech and language fluent Cranial nerve II-XII: Pupils were equal round reactive to light. Extraocular movements were full, visual field were full on confrontational test. Facial sensation and strength were normal.  Head turning and shoulder shrug  were normal and symmetric. Motor: The motor testing reveals 5 over 5 strength of all 4 extremities. Good symmetric motor tone is noted throughout.  Sensory: Sensory testing is intact to soft touch on all 4 extremities. No evidence of extinction is noted.  Coordination: Cerebellar testing reveals good finger-nose-finger and heel-to-shin bilaterally.  Gait and station: Gait is wide-based, limp on the right.  No assistive device Reflexes: Deep tendon reflexes are symmetric and normal bilaterally.   DIAGNOSTIC DATA (LABS, IMAGING, TESTING) - I reviewed patient records, labs, notes, testing and imaging myself where available.  Lab Results  Component Value Date   WBC 6.7 08/25/2024   HGB 11.9 08/25/2024   HCT 36.7  08/25/2024   MCV 86 08/25/2024   PLT 249 08/25/2024      Component Value Date/Time   NA 143 08/25/2024 1551   K 3.5 08/25/2024 1551   CL 104 08/25/2024 1551   CO2 20 08/25/2024 1551   GLUCOSE 95 08/25/2024 1551   GLUCOSE 103 (H) 05/30/2022 0841   BUN 11 08/25/2024 1551   CREATININE 0.76 08/25/2024 1551   CREATININE 0.69 09/23/2016 1442   CALCIUM  9.3 08/25/2024 1551   PROT 6.9 08/25/2024 1551   ALBUMIN 4.4 08/25/2024 1551   AST 43 (H) 08/25/2024 1551   ALT 35 (H) 08/25/2024 1551   ALKPHOS 102 08/25/2024 1551   BILITOT 0.4  08/25/2024 1551   GFRNONAA >60 05/30/2022 0841   GFRNONAA >89 09/23/2016 1442   GFRAA >60 05/09/2020 0524   GFRAA >89 09/23/2016 1442   Lab Results  Component Value Date   CHOL 142 11/24/2023   HDL 34 (L) 11/24/2023   LDLCALC 56 11/24/2023   TRIG 341 (H) 11/24/2023   CHOLHDL 4.2 11/24/2023   Lab Results  Component Value Date   HGBA1C 5.4 05/24/2023   Lab Results  Component Value Date   VITAMINB12 660 05/24/2023   Lab Results  Component Value Date   TSH 4.100 10/08/2022    Lauraine Born, AGNP-C, DNP 08/31/2024, 2:57 PM [facility] [address], [address] [place], [place] [address] [phone]   "

## 2024-09-04 ENCOUNTER — Telehealth: Payer: Self-pay | Admitting: *Deleted

## 2024-09-04 ENCOUNTER — Other Ambulatory Visit: Payer: Self-pay

## 2024-09-04 NOTE — Progress Notes (Signed)
 Care Guide Pharmacy Note  09/04/2024 Name: [name] MRN: [id number] DOB: 09-04-1985  Referred By: Oley Bascom RAMAN, NP Reason for referral: Complex Care Management (Initial outreach to schedule referral with PharmD)   [name] is a 39 y.o. year old female who is a primary care patient of Oley Bascom RAMAN, NP.  [name] was referred to the pharmacist for assistance related to: HTN, DMII, and weight loss  Successful contact was made with the patient to discuss pharmacy services including being ready for the pharmacist to call at least 5 minutes before the scheduled appointment time and to have medication bottles and any blood pressure readings ready for review. The patient agreed to meet with the pharmacist via telephone visit on (date/time).3/3 at 130 PM  Harlene Satterfield  [facility], [facility] Guide  Direct Dial: [phone]  Fax [phone]

## 2024-09-05 ENCOUNTER — Other Ambulatory Visit: Payer: Self-pay

## 2024-09-05 ENCOUNTER — Telehealth: Payer: Self-pay | Admitting: Nurse Practitioner

## 2024-09-05 NOTE — Telephone Encounter (Signed)
 Forms to be completed dropped off in providers slot. Provider has the folders currently. 09/05/24

## 2024-09-07 ENCOUNTER — Other Ambulatory Visit: Payer: Self-pay

## 2024-09-07 DIAGNOSIS — I639 Cerebral infarction, unspecified: Secondary | ICD-10-CM

## 2024-09-07 DIAGNOSIS — Z6834 Body mass index (BMI) 34.0-34.9, adult: Secondary | ICD-10-CM

## 2024-09-07 MED ORDER — WEGOVY 0.25 MG/0.5ML ~~LOC~~ SOAJ: 0.2500 mg | SUBCUTANEOUS | 1 refills | Status: AC

## 2024-09-07 NOTE — Progress Notes (Signed)
 Ozempic  was not covered by pt insurance because she does not have a dx of T2DM. She does have a BMI > 30 and hx of CVA (2021), so she does qualify for coverage of Wegovy  via indication for reducing risk of ASCVD.   Will collaborate with patient advocate to resubmit PA.   [name], PharmD [facility] [phone]

## 2024-09-14 ENCOUNTER — Other Ambulatory Visit: Payer: Self-pay

## 2024-09-18 ENCOUNTER — Other Ambulatory Visit: Payer: Self-pay

## 2024-09-21 ENCOUNTER — Other Ambulatory Visit: Payer: Self-pay | Admitting: Nurse Practitioner

## 2024-09-21 DIAGNOSIS — F411 Generalized anxiety disorder: Secondary | ICD-10-CM

## 2024-09-21 NOTE — Telephone Encounter (Signed)
 Please Advise.  CB.

## 2024-09-21 NOTE — Telephone Encounter (Signed)
 cloNIDine  (CATAPRES ) 0.1 MG tablet [Pharmacy Med Name: CLONIDINE  HCL 0.1 MG ORAL TABLET]      hydrOXYzine  (ATARAX ) 25 MG tablet [Pharmacy Med Name: HYDROXYZINE  HCL 25 MG ORAL TABLET]

## 2024-09-25 ENCOUNTER — Other Ambulatory Visit: Payer: Self-pay

## 2024-10-11 ENCOUNTER — Other Ambulatory Visit

## 2024-10-24 ENCOUNTER — Other Ambulatory Visit: Payer: Self-pay
